# Patient Record
Sex: Male | Born: 1935 | Race: White | Hispanic: No | Marital: Married | Smoking: Never smoker
Health system: Southern US, Community
[De-identification: ages and names within clinical notes are randomized; demographics above are authoritative.]

## PROBLEM LIST (undated history)

## (undated) DIAGNOSIS — I428 Other cardiomyopathies: Secondary | ICD-10-CM

## (undated) DIAGNOSIS — H3530 Unspecified macular degeneration: Secondary | ICD-10-CM

## (undated) DIAGNOSIS — K449 Diaphragmatic hernia without obstruction or gangrene: Secondary | ICD-10-CM

## (undated) DIAGNOSIS — E871 Hypo-osmolality and hyponatremia: Secondary | ICD-10-CM

## (undated) DIAGNOSIS — H5440 Blindness, one eye, unspecified eye: Secondary | ICD-10-CM

## (undated) DIAGNOSIS — K922 Gastrointestinal hemorrhage, unspecified: Secondary | ICD-10-CM

## (undated) DIAGNOSIS — I10 Essential (primary) hypertension: Secondary | ICD-10-CM

## (undated) DIAGNOSIS — D509 Iron deficiency anemia, unspecified: Secondary | ICD-10-CM

## (undated) DIAGNOSIS — Z8711 Personal history of peptic ulcer disease: Secondary | ICD-10-CM

## (undated) DIAGNOSIS — S139XXA Sprain of joints and ligaments of unspecified parts of neck, initial encounter: Secondary | ICD-10-CM

## (undated) DIAGNOSIS — R42 Dizziness and giddiness: Secondary | ICD-10-CM

## (undated) DIAGNOSIS — I4819 Other persistent atrial fibrillation: Secondary | ICD-10-CM

## (undated) DIAGNOSIS — K5730 Diverticulosis of large intestine without perforation or abscess without bleeding: Secondary | ICD-10-CM

## (undated) DIAGNOSIS — K219 Gastro-esophageal reflux disease without esophagitis: Secondary | ICD-10-CM

## (undated) DIAGNOSIS — E7800 Pure hypercholesterolemia, unspecified: Secondary | ICD-10-CM

## (undated) DIAGNOSIS — K2970 Gastritis, unspecified, without bleeding: Secondary | ICD-10-CM

## (undated) DIAGNOSIS — K649 Unspecified hemorrhoids: Secondary | ICD-10-CM

## (undated) DIAGNOSIS — G4730 Sleep apnea, unspecified: Secondary | ICD-10-CM

## (undated) HISTORY — PX: PARTIAL GASTRECTOMY: SHX2172

## (undated) HISTORY — DX: Iron deficiency anemia, unspecified: D50.9

## (undated) HISTORY — DX: Unspecified macular degeneration: H35.30

## (undated) HISTORY — DX: Other persistent atrial fibrillation: I48.19

## (undated) HISTORY — DX: Blindness, one eye, unspecified eye: H54.40

## (undated) HISTORY — DX: Gastro-esophageal reflux disease without esophagitis: K21.9

## (undated) HISTORY — DX: Unspecified hemorrhoids: K64.9

## (undated) HISTORY — DX: Diverticulosis of large intestine without perforation or abscess without bleeding: K57.30

## (undated) HISTORY — DX: Dizziness and giddiness: R42

## (undated) HISTORY — PX: CARDIAC CATHETERIZATION: SHX172

## (undated) HISTORY — DX: Essential (primary) hypertension: I10

## (undated) HISTORY — DX: Personal history of peptic ulcer disease: Z87.11

## (undated) HISTORY — DX: Diaphragmatic hernia without obstruction or gangrene: K44.9

## (undated) HISTORY — DX: Sleep apnea, unspecified: G47.30

## (undated) HISTORY — PX: APPENDECTOMY: SHX54

## (undated) HISTORY — PX: CHOLECYSTECTOMY: SHX55

## (undated) HISTORY — DX: Other cardiomyopathies: I42.8

## (undated) HISTORY — DX: Pure hypercholesterolemia, unspecified: E78.00

## (undated) HISTORY — DX: Sprain of joints and ligaments of unspecified parts of neck, initial encounter: S13.9XXA

## (undated) HISTORY — DX: Gastritis, unspecified, without bleeding: K29.70

## (undated) HISTORY — DX: Gastrointestinal hemorrhage, unspecified: K92.2

---

## 1993-10-04 HISTORY — PX: MITRAL VALVE REPAIR: SHX2039

## 1994-10-04 HISTORY — PX: MITRAL VALVE ANNULOPLASTY: SHX2038

## 1994-10-04 HISTORY — PX: CARDIAC CATHETERIZATION: SHX172

## 1999-06-28 ENCOUNTER — Emergency Department (HOSPITAL_COMMUNITY): Admission: EM | Admit: 1999-06-28 | Discharge: 1999-06-28 | Payer: Self-pay | Admitting: *Deleted

## 2001-07-06 ENCOUNTER — Encounter (INDEPENDENT_AMBULATORY_CARE_PROVIDER_SITE_OTHER): Payer: Self-pay | Admitting: Gastroenterology

## 2003-01-03 ENCOUNTER — Encounter: Admission: RE | Admit: 2003-01-03 | Discharge: 2003-01-03 | Payer: Self-pay | Admitting: Family Medicine

## 2003-01-03 ENCOUNTER — Encounter: Payer: Self-pay | Admitting: Family Medicine

## 2003-05-23 ENCOUNTER — Encounter: Payer: Self-pay | Admitting: Cardiology

## 2003-11-26 ENCOUNTER — Ambulatory Visit (HOSPITAL_COMMUNITY): Admission: RE | Admit: 2003-11-26 | Discharge: 2003-11-26 | Payer: Self-pay | Admitting: Cardiology

## 2004-08-18 ENCOUNTER — Ambulatory Visit: Payer: Self-pay | Admitting: *Deleted

## 2004-08-26 ENCOUNTER — Ambulatory Visit: Payer: Self-pay | Admitting: Cardiology

## 2004-09-04 ENCOUNTER — Ambulatory Visit: Payer: Self-pay | Admitting: Family Medicine

## 2004-09-16 ENCOUNTER — Ambulatory Visit: Payer: Self-pay | Admitting: Cardiology

## 2004-10-07 ENCOUNTER — Ambulatory Visit: Payer: Self-pay | Admitting: Cardiology

## 2004-10-21 ENCOUNTER — Ambulatory Visit: Payer: Self-pay | Admitting: Cardiology

## 2004-11-05 ENCOUNTER — Ambulatory Visit: Payer: Self-pay | Admitting: Cardiology

## 2004-11-19 ENCOUNTER — Ambulatory Visit: Payer: Self-pay | Admitting: *Deleted

## 2004-12-17 ENCOUNTER — Ambulatory Visit: Payer: Self-pay | Admitting: Cardiology

## 2004-12-31 ENCOUNTER — Ambulatory Visit: Payer: Self-pay | Admitting: Internal Medicine

## 2005-01-28 ENCOUNTER — Ambulatory Visit: Payer: Self-pay | Admitting: Cardiology

## 2005-02-25 ENCOUNTER — Ambulatory Visit: Payer: Self-pay | Admitting: Cardiology

## 2005-03-18 ENCOUNTER — Ambulatory Visit: Payer: Self-pay | Admitting: Internal Medicine

## 2005-04-15 ENCOUNTER — Ambulatory Visit: Payer: Self-pay | Admitting: Cardiology

## 2005-05-06 ENCOUNTER — Ambulatory Visit: Payer: Self-pay | Admitting: Cardiology

## 2005-05-06 ENCOUNTER — Ambulatory Visit: Payer: Self-pay | Admitting: Internal Medicine

## 2005-06-03 ENCOUNTER — Ambulatory Visit: Payer: Self-pay | Admitting: Internal Medicine

## 2005-06-23 ENCOUNTER — Ambulatory Visit: Payer: Self-pay | Admitting: Family Medicine

## 2005-06-23 ENCOUNTER — Ambulatory Visit: Payer: Self-pay | Admitting: Cardiology

## 2005-06-25 ENCOUNTER — Ambulatory Visit: Payer: Self-pay | Admitting: Family Medicine

## 2005-07-01 ENCOUNTER — Ambulatory Visit: Payer: Self-pay | Admitting: Cardiology

## 2005-07-21 ENCOUNTER — Ambulatory Visit: Payer: Self-pay | Admitting: Family Medicine

## 2005-07-28 ENCOUNTER — Ambulatory Visit: Payer: Self-pay | Admitting: Family Medicine

## 2005-07-29 ENCOUNTER — Ambulatory Visit: Payer: Self-pay | Admitting: Cardiology

## 2005-08-05 ENCOUNTER — Ambulatory Visit: Payer: Self-pay | Admitting: Cardiology

## 2005-08-12 ENCOUNTER — Ambulatory Visit: Payer: Self-pay | Admitting: Family Medicine

## 2005-08-19 ENCOUNTER — Ambulatory Visit: Payer: Self-pay | Admitting: Internal Medicine

## 2005-09-02 ENCOUNTER — Ambulatory Visit: Payer: Self-pay | Admitting: Cardiology

## 2005-09-16 ENCOUNTER — Ambulatory Visit: Payer: Self-pay | Admitting: Family Medicine

## 2005-09-22 ENCOUNTER — Ambulatory Visit: Payer: Self-pay | Admitting: Family Medicine

## 2005-09-22 ENCOUNTER — Ambulatory Visit: Payer: Self-pay | Admitting: Internal Medicine

## 2005-10-07 ENCOUNTER — Ambulatory Visit: Payer: Self-pay | Admitting: Internal Medicine

## 2005-10-14 ENCOUNTER — Ambulatory Visit: Payer: Self-pay | Admitting: Family Medicine

## 2005-10-20 ENCOUNTER — Ambulatory Visit: Payer: Self-pay | Admitting: Family Medicine

## 2005-11-03 ENCOUNTER — Ambulatory Visit: Payer: Self-pay | Admitting: Family Medicine

## 2005-11-17 ENCOUNTER — Ambulatory Visit: Payer: Self-pay | Admitting: Family Medicine

## 2005-11-22 ENCOUNTER — Ambulatory Visit: Payer: Self-pay | Admitting: Family Medicine

## 2005-11-25 ENCOUNTER — Ambulatory Visit: Payer: Self-pay | Admitting: Family Medicine

## 2005-12-16 ENCOUNTER — Ambulatory Visit: Payer: Self-pay | Admitting: Family Medicine

## 2005-12-23 ENCOUNTER — Ambulatory Visit: Payer: Self-pay | Admitting: Family Medicine

## 2005-12-27 ENCOUNTER — Ambulatory Visit: Payer: Self-pay | Admitting: Family Medicine

## 2006-01-05 ENCOUNTER — Ambulatory Visit: Payer: Self-pay | Admitting: Family Medicine

## 2006-01-19 ENCOUNTER — Ambulatory Visit: Payer: Self-pay | Admitting: Family Medicine

## 2006-02-16 ENCOUNTER — Ambulatory Visit: Payer: Self-pay | Admitting: Family Medicine

## 2006-03-02 ENCOUNTER — Ambulatory Visit: Payer: Self-pay | Admitting: Family Medicine

## 2006-03-10 ENCOUNTER — Ambulatory Visit: Payer: Self-pay | Admitting: Family Medicine

## 2006-03-16 ENCOUNTER — Ambulatory Visit: Payer: Self-pay | Admitting: Family Medicine

## 2006-04-13 ENCOUNTER — Ambulatory Visit: Payer: Self-pay | Admitting: Family Medicine

## 2006-05-11 ENCOUNTER — Ambulatory Visit: Payer: Self-pay | Admitting: Family Medicine

## 2006-06-08 ENCOUNTER — Ambulatory Visit: Payer: Self-pay | Admitting: Family Medicine

## 2006-07-07 ENCOUNTER — Ambulatory Visit: Payer: Self-pay | Admitting: Family Medicine

## 2006-07-20 ENCOUNTER — Ambulatory Visit: Payer: Self-pay | Admitting: Family Medicine

## 2006-08-17 ENCOUNTER — Ambulatory Visit: Payer: Self-pay | Admitting: Family Medicine

## 2006-09-14 ENCOUNTER — Ambulatory Visit: Payer: Self-pay | Admitting: Family Medicine

## 2006-09-19 ENCOUNTER — Ambulatory Visit: Payer: Self-pay | Admitting: Family Medicine

## 2006-10-12 ENCOUNTER — Ambulatory Visit: Payer: Self-pay | Admitting: Family Medicine

## 2006-11-02 ENCOUNTER — Ambulatory Visit: Payer: Self-pay | Admitting: Gastroenterology

## 2006-11-09 ENCOUNTER — Ambulatory Visit: Payer: Self-pay | Admitting: Family Medicine

## 2006-12-07 ENCOUNTER — Ambulatory Visit: Payer: Self-pay | Admitting: Family Medicine

## 2006-12-21 ENCOUNTER — Ambulatory Visit: Payer: Self-pay | Admitting: Family Medicine

## 2007-01-04 ENCOUNTER — Ambulatory Visit: Payer: Self-pay | Admitting: Gastroenterology

## 2007-01-11 ENCOUNTER — Ambulatory Visit: Payer: Self-pay | Admitting: Family Medicine

## 2007-01-17 ENCOUNTER — Ambulatory Visit: Payer: Self-pay | Admitting: Gastroenterology

## 2007-02-01 ENCOUNTER — Ambulatory Visit: Payer: Self-pay | Admitting: Family Medicine

## 2007-02-01 DIAGNOSIS — I4891 Unspecified atrial fibrillation: Secondary | ICD-10-CM

## 2007-02-01 LAB — CONVERTED CEMR LAB: INR: 3.6

## 2007-02-07 ENCOUNTER — Telehealth (INDEPENDENT_AMBULATORY_CARE_PROVIDER_SITE_OTHER): Payer: Self-pay | Admitting: *Deleted

## 2007-02-15 ENCOUNTER — Ambulatory Visit: Payer: Self-pay | Admitting: Family Medicine

## 2007-03-13 ENCOUNTER — Ambulatory Visit: Payer: Self-pay | Admitting: Cardiology

## 2007-03-13 LAB — CONVERTED CEMR LAB
Basophils Absolute: 0.1 10*3/uL (ref 0.0–0.1)
CO2: 29 meq/L (ref 19–32)
Creatinine, Ser: 0.9 mg/dL (ref 0.4–1.5)
Eosinophils Relative: 1.1 % (ref 0.0–5.0)
Glucose, Bld: 111 mg/dL — ABNORMAL HIGH (ref 70–99)
HCT: 37 % — ABNORMAL LOW (ref 39.0–52.0)
Hemoglobin: 12.3 g/dL — ABNORMAL LOW (ref 13.0–17.0)
INR: 2 (ref 0.9–2.0)
MCHC: 33.1 g/dL (ref 30.0–36.0)
Monocytes Absolute: 0.6 10*3/uL (ref 0.2–0.7)
Neutrophils Relative %: 59.4 % (ref 43.0–77.0)
Potassium: 4.1 meq/L (ref 3.5–5.1)
RDW: 14.4 % (ref 11.5–14.6)
Sodium: 139 meq/L (ref 135–145)
WBC: 6 10*3/uL (ref 4.5–10.5)

## 2007-03-14 ENCOUNTER — Ambulatory Visit: Payer: Self-pay | Admitting: Cardiology

## 2007-03-14 ENCOUNTER — Encounter: Payer: Self-pay | Admitting: Cardiology

## 2007-03-14 ENCOUNTER — Ambulatory Visit: Payer: Self-pay

## 2007-03-14 ENCOUNTER — Ambulatory Visit: Payer: Self-pay | Admitting: Family Medicine

## 2007-03-15 ENCOUNTER — Ambulatory Visit: Payer: Self-pay | Admitting: Internal Medicine

## 2007-03-15 ENCOUNTER — Inpatient Hospital Stay (HOSPITAL_COMMUNITY): Admission: AD | Admit: 2007-03-15 | Discharge: 2007-03-17 | Payer: Self-pay | Admitting: Internal Medicine

## 2007-03-15 ENCOUNTER — Encounter: Payer: Self-pay | Admitting: Family Medicine

## 2007-03-20 ENCOUNTER — Ambulatory Visit: Payer: Self-pay | Admitting: Family Medicine

## 2007-03-20 LAB — CONVERTED CEMR LAB: INR: 4.2

## 2007-03-23 ENCOUNTER — Ambulatory Visit: Payer: Self-pay | Admitting: Family Medicine

## 2007-03-23 LAB — CONVERTED CEMR LAB
INR: 2.3
Prothrombin Time: 18.4 s

## 2007-03-29 ENCOUNTER — Ambulatory Visit: Payer: Self-pay | Admitting: Cardiology

## 2007-03-29 ENCOUNTER — Ambulatory Visit: Payer: Self-pay | Admitting: Family Medicine

## 2007-03-29 LAB — CONVERTED CEMR LAB
INR: 1
Prothrombin Time: 12.4 s

## 2007-04-12 ENCOUNTER — Ambulatory Visit: Payer: Self-pay | Admitting: Family Medicine

## 2007-04-12 LAB — CONVERTED CEMR LAB
INR: 5.8
Prothrombin Time: 29.1 s

## 2007-04-19 ENCOUNTER — Ambulatory Visit: Payer: Self-pay | Admitting: Family Medicine

## 2007-04-19 ENCOUNTER — Encounter: Payer: Self-pay | Admitting: Cardiology

## 2007-04-19 ENCOUNTER — Ambulatory Visit: Payer: Self-pay

## 2007-04-19 LAB — CONVERTED CEMR LAB
INR: 1.2
Prothrombin Time: 13.6 s

## 2007-04-27 ENCOUNTER — Ambulatory Visit: Payer: Self-pay | Admitting: Cardiology

## 2007-04-27 ENCOUNTER — Ambulatory Visit: Payer: Self-pay | Admitting: Family Medicine

## 2007-04-27 LAB — CONVERTED CEMR LAB
AST: 43 units/L — ABNORMAL HIGH (ref 0–37)
Albumin: 3.9 g/dL (ref 3.5–5.2)
Alkaline Phosphatase: 55 units/L (ref 39–117)
CO2: 30 meq/L (ref 19–32)
Chloride: 97 meq/L (ref 96–112)
Creatinine, Ser: 0.9 mg/dL (ref 0.4–1.5)
Free T4: 1.1 ng/dL (ref 0.6–1.6)
Sodium: 135 meq/L (ref 135–145)
Total Bilirubin: 0.7 mg/dL (ref 0.3–1.2)

## 2007-04-28 LAB — CONVERTED CEMR LAB
ALT: 58 units/L — ABNORMAL HIGH (ref 0–53)
Albumin: 3.9 g/dL (ref 3.5–5.2)
BUN: 7 mg/dL (ref 6–23)
Bilirubin, Direct: 0.1 mg/dL (ref 0.0–0.3)
Calcium: 9 mg/dL (ref 8.4–10.5)
GFR calc Af Amer: 122 mL/min
GFR calc non Af Amer: 101 mL/min
Glucose, Bld: 94 mg/dL (ref 70–99)
LDL Cholesterol: 92 mg/dL (ref 0–99)
Total CHOL/HDL Ratio: 4.2
VLDL: 31 mg/dL (ref 0–40)

## 2007-05-10 ENCOUNTER — Ambulatory Visit: Payer: Self-pay | Admitting: Family Medicine

## 2007-05-10 ENCOUNTER — Ambulatory Visit: Admission: RE | Admit: 2007-05-10 | Discharge: 2007-05-10 | Payer: Self-pay | Admitting: Cardiology

## 2007-05-11 LAB — CONVERTED CEMR LAB
ALT: 54 units/L — ABNORMAL HIGH (ref 0–53)
AST: 49 units/L — ABNORMAL HIGH (ref 0–37)
Bilirubin, Direct: 0.1 mg/dL (ref 0.0–0.3)
Total Protein: 7.1 g/dL (ref 6.0–8.3)

## 2007-05-24 ENCOUNTER — Ambulatory Visit: Payer: Self-pay | Admitting: Family Medicine

## 2007-06-21 ENCOUNTER — Ambulatory Visit: Payer: Self-pay | Admitting: Family Medicine

## 2007-06-21 LAB — CONVERTED CEMR LAB
ALT: 44 units/L (ref 0–53)
AST: 41 units/L — ABNORMAL HIGH (ref 0–37)
Bilirubin, Direct: 0.1 mg/dL (ref 0.0–0.3)
Total Protein: 6.8 g/dL (ref 6.0–8.3)

## 2007-07-06 ENCOUNTER — Ambulatory Visit: Payer: Self-pay | Admitting: Cardiology

## 2007-07-06 LAB — CONVERTED CEMR LAB
GFR calc non Af Amer: 88 mL/min
Potassium: 4.8 meq/L (ref 3.5–5.1)
Sodium: 136 meq/L (ref 135–145)
TSH: 0.72 microintl units/mL (ref 0.35–5.50)

## 2007-07-19 ENCOUNTER — Encounter: Payer: Self-pay | Admitting: Cardiology

## 2007-07-19 ENCOUNTER — Ambulatory Visit: Payer: Self-pay

## 2007-07-19 ENCOUNTER — Ambulatory Visit: Payer: Self-pay | Admitting: Family Medicine

## 2007-07-27 ENCOUNTER — Ambulatory Visit: Payer: Self-pay | Admitting: Cardiology

## 2007-08-04 ENCOUNTER — Ambulatory Visit: Payer: Self-pay | Admitting: Family Medicine

## 2007-09-06 ENCOUNTER — Ambulatory Visit: Payer: Self-pay | Admitting: Family Medicine

## 2007-10-04 ENCOUNTER — Ambulatory Visit: Payer: Self-pay | Admitting: Family Medicine

## 2007-10-04 LAB — CONVERTED CEMR LAB
INR: 2.4
Prothrombin Time: 18.8 s

## 2007-11-01 ENCOUNTER — Ambulatory Visit: Payer: Self-pay | Admitting: Family Medicine

## 2007-11-01 LAB — CONVERTED CEMR LAB: INR: 1.9

## 2007-11-16 ENCOUNTER — Ambulatory Visit: Payer: Self-pay | Admitting: Cardiology

## 2007-11-16 LAB — CONVERTED CEMR LAB
BUN: 5 mg/dL — ABNORMAL LOW (ref 6–23)
Basophils Absolute: 0 10*3/uL (ref 0.0–0.1)
Creatinine, Ser: 0.8 mg/dL (ref 0.4–1.5)
Eosinophils Absolute: 0.3 10*3/uL (ref 0.0–0.6)
GFR calc Af Amer: 122 mL/min
GFR calc non Af Amer: 101 mL/min
Hemoglobin: 12.8 g/dL — ABNORMAL LOW (ref 13.0–17.0)
MCHC: 32.6 g/dL (ref 30.0–36.0)
Monocytes Absolute: 0.9 10*3/uL — ABNORMAL HIGH (ref 0.2–0.7)
Monocytes Relative: 18.6 % — ABNORMAL HIGH (ref 3.0–11.0)
Potassium: 4.7 meq/L (ref 3.5–5.1)
RBC: 4.94 M/uL (ref 4.22–5.81)
RDW: 14 % (ref 11.5–14.6)
TSH: 0.51 microintl units/mL (ref 0.35–5.50)

## 2007-11-21 ENCOUNTER — Ambulatory Visit: Payer: Self-pay | Admitting: Cardiology

## 2007-11-29 ENCOUNTER — Ambulatory Visit: Payer: Self-pay | Admitting: Family Medicine

## 2007-11-29 LAB — CONVERTED CEMR LAB: INR: 2.7

## 2007-12-27 ENCOUNTER — Ambulatory Visit: Payer: Self-pay | Admitting: Family Medicine

## 2007-12-27 LAB — CONVERTED CEMR LAB

## 2008-01-03 ENCOUNTER — Ambulatory Visit: Payer: Self-pay | Admitting: Family Medicine

## 2008-01-31 ENCOUNTER — Ambulatory Visit: Payer: Self-pay | Admitting: Cardiology

## 2008-01-31 ENCOUNTER — Ambulatory Visit: Payer: Self-pay | Admitting: Family Medicine

## 2008-01-31 LAB — CONVERTED CEMR LAB
Basophils Relative: 0.9 % (ref 0.0–1.0)
Eosinophils Relative: 6.2 % — ABNORMAL HIGH (ref 0.0–5.0)
INR: 2.2
Lymphocytes Relative: 37.3 % (ref 12.0–46.0)
Monocytes Relative: 17.8 % — ABNORMAL HIGH (ref 3.0–12.0)
Neutrophils Relative %: 37.8 % — ABNORMAL LOW (ref 43.0–77.0)
Prothrombin Time: 18.2 s
RBC: 4.38 M/uL (ref 4.22–5.81)
WBC: 4.9 10*3/uL (ref 4.5–10.5)

## 2008-02-28 ENCOUNTER — Ambulatory Visit: Payer: Self-pay | Admitting: Cardiology

## 2008-02-28 ENCOUNTER — Ambulatory Visit: Payer: Self-pay | Admitting: Family Medicine

## 2008-02-28 LAB — CONVERTED CEMR LAB
ALT: 27 units/L (ref 0–53)
AST: 34 units/L (ref 0–37)
Bilirubin, Direct: 0.1 mg/dL (ref 0.0–0.3)
Eosinophils Relative: 6.3 % — ABNORMAL HIGH (ref 0.0–5.0)
HCT: 35.4 % — ABNORMAL LOW (ref 39.0–52.0)
Hemoglobin: 11.4 g/dL — ABNORMAL LOW (ref 13.0–17.0)
INR: 2.3
Lymphocytes Relative: 31.9 % (ref 12.0–46.0)
Monocytes Absolute: 0.8 10*3/uL (ref 0.1–1.0)
Monocytes Relative: 17.8 % — ABNORMAL HIGH (ref 3.0–12.0)
Neutro Abs: 1.8 10*3/uL (ref 1.4–7.7)
Prothrombin Time: 18.5 s
Total Protein: 6.6 g/dL (ref 6.0–8.3)
WBC: 4.3 10*3/uL — ABNORMAL LOW (ref 4.5–10.5)

## 2008-03-27 ENCOUNTER — Ambulatory Visit: Payer: Self-pay | Admitting: Family Medicine

## 2008-03-27 LAB — CONVERTED CEMR LAB: INR: 2.5

## 2008-04-08 DIAGNOSIS — E7800 Pure hypercholesterolemia, unspecified: Secondary | ICD-10-CM | POA: Insufficient documentation

## 2008-04-08 DIAGNOSIS — G4730 Sleep apnea, unspecified: Secondary | ICD-10-CM | POA: Insufficient documentation

## 2008-04-08 DIAGNOSIS — Z8719 Personal history of other diseases of the digestive system: Secondary | ICD-10-CM

## 2008-04-08 DIAGNOSIS — K5730 Diverticulosis of large intestine without perforation or abscess without bleeding: Secondary | ICD-10-CM | POA: Insufficient documentation

## 2008-04-08 DIAGNOSIS — I501 Left ventricular failure: Secondary | ICD-10-CM

## 2008-04-08 DIAGNOSIS — K649 Unspecified hemorrhoids: Secondary | ICD-10-CM | POA: Insufficient documentation

## 2008-04-08 DIAGNOSIS — K449 Diaphragmatic hernia without obstruction or gangrene: Secondary | ICD-10-CM | POA: Insufficient documentation

## 2008-04-09 ENCOUNTER — Ambulatory Visit: Payer: Self-pay | Admitting: Gastroenterology

## 2008-04-09 DIAGNOSIS — K219 Gastro-esophageal reflux disease without esophagitis: Secondary | ICD-10-CM | POA: Insufficient documentation

## 2008-04-09 DIAGNOSIS — D509 Iron deficiency anemia, unspecified: Secondary | ICD-10-CM

## 2008-04-10 LAB — CONVERTED CEMR LAB
Folate: 16.9 ng/mL
Saturation Ratios: 3.1 % — ABNORMAL LOW (ref 20.0–50.0)
Transferrin: 369.8 mg/dL — ABNORMAL HIGH (ref >=212.0)
Vitamin B-12: 274 pg/mL (ref 211–911)

## 2008-04-17 ENCOUNTER — Ambulatory Visit: Payer: Self-pay | Admitting: Gastroenterology

## 2008-04-17 ENCOUNTER — Other Ambulatory Visit: Payer: Self-pay

## 2008-04-17 ENCOUNTER — Inpatient Hospital Stay: Payer: Self-pay | Admitting: Internal Medicine

## 2008-04-17 ENCOUNTER — Encounter: Payer: Self-pay | Admitting: Gastroenterology

## 2008-04-17 ENCOUNTER — Telehealth: Payer: Self-pay | Admitting: Gastroenterology

## 2008-04-22 ENCOUNTER — Encounter: Payer: Self-pay | Admitting: Gastroenterology

## 2008-04-22 ENCOUNTER — Encounter: Payer: Self-pay | Admitting: Family Medicine

## 2008-04-25 ENCOUNTER — Encounter: Payer: Self-pay | Admitting: Family Medicine

## 2008-05-08 ENCOUNTER — Ambulatory Visit: Payer: Self-pay | Admitting: Cardiology

## 2008-05-08 LAB — CONVERTED CEMR LAB
ALT: 21 units/L (ref 0–53)
AST: 23 units/L (ref 0–37)
Albumin: 3.9 g/dL (ref 3.5–5.2)
Alkaline Phosphatase: 46 units/L (ref 39–117)
BUN: 9 mg/dL (ref 6–23)
Bilirubin, Direct: 0.1 mg/dL (ref 0.0–0.3)
CO2: 31 meq/L (ref 19–32)
Chloride: 97 meq/L (ref 96–112)
Eosinophils Relative: 4.8 % (ref 0.0–5.0)
GFR calc Af Amer: 94 mL/min
Glucose, Bld: 105 mg/dL — ABNORMAL HIGH (ref 70–99)
HCT: 36.6 % — ABNORMAL LOW (ref 39.0–52.0)
Lymphocytes Relative: 31.2 % (ref 12.0–46.0)
Monocytes Absolute: 0.5 10*3/uL (ref 0.1–1.0)
Monocytes Relative: 9.5 % (ref 3.0–12.0)
Neutrophils Relative %: 53.8 % (ref 43.0–77.0)
Platelets: 333 10*3/uL (ref 150–400)
Potassium: 4.1 meq/L (ref 3.5–5.1)
RDW: 16.2 % — ABNORMAL HIGH (ref 11.5–14.6)
Sodium: 136 meq/L (ref 135–145)
Total Protein: 6.6 g/dL (ref 6.0–8.3)
WBC: 5 10*3/uL (ref 4.5–10.5)

## 2008-05-24 ENCOUNTER — Ambulatory Visit: Payer: Self-pay | Admitting: Family Medicine

## 2008-05-29 ENCOUNTER — Ambulatory Visit: Payer: Self-pay | Admitting: Cardiology

## 2008-05-30 ENCOUNTER — Encounter: Payer: Self-pay | Admitting: Family Medicine

## 2008-06-13 ENCOUNTER — Ambulatory Visit: Payer: Self-pay | Admitting: Family Medicine

## 2008-06-13 LAB — CONVERTED CEMR LAB: INR: 1

## 2008-06-19 ENCOUNTER — Ambulatory Visit: Payer: Self-pay | Admitting: Family Medicine

## 2008-06-19 ENCOUNTER — Ambulatory Visit: Payer: Self-pay | Admitting: Cardiology

## 2008-06-19 LAB — CONVERTED CEMR LAB
INR: 1.5
Prothrombin Time: 14.9 s

## 2008-06-26 ENCOUNTER — Ambulatory Visit: Payer: Self-pay | Admitting: Family Medicine

## 2008-06-26 LAB — CONVERTED CEMR LAB: Prothrombin Time: 16.2 s

## 2008-07-01 ENCOUNTER — Encounter: Payer: Self-pay | Admitting: Family Medicine

## 2008-07-03 ENCOUNTER — Ambulatory Visit: Payer: Self-pay | Admitting: Family Medicine

## 2008-07-03 LAB — CONVERTED CEMR LAB
INR: 1.7
Prothrombin Time: 15.9 s

## 2008-07-17 ENCOUNTER — Ambulatory Visit: Payer: Self-pay | Admitting: Family Medicine

## 2008-07-17 LAB — CONVERTED CEMR LAB: INR: 1.7

## 2008-07-29 ENCOUNTER — Telehealth: Payer: Self-pay | Admitting: Family Medicine

## 2008-07-29 ENCOUNTER — Ambulatory Visit: Payer: Self-pay | Admitting: Family Medicine

## 2008-07-29 ENCOUNTER — Ambulatory Visit: Payer: Self-pay | Admitting: Cardiology

## 2008-08-19 ENCOUNTER — Encounter: Payer: Self-pay | Admitting: Family Medicine

## 2008-08-23 ENCOUNTER — Encounter: Payer: Self-pay | Admitting: Family Medicine

## 2008-08-28 ENCOUNTER — Ambulatory Visit: Payer: Self-pay | Admitting: Family Medicine

## 2008-08-28 LAB — CONVERTED CEMR LAB: INR: 1.5

## 2008-09-12 ENCOUNTER — Ambulatory Visit: Payer: Self-pay | Admitting: Family Medicine

## 2008-09-12 LAB — CONVERTED CEMR LAB: Prothrombin Time: 18.3 s

## 2008-09-30 ENCOUNTER — Ambulatory Visit: Payer: Self-pay | Admitting: Family Medicine

## 2008-09-30 LAB — CONVERTED CEMR LAB
INR: 1.5
Prothrombin Time: 14.9 s

## 2008-10-07 ENCOUNTER — Encounter: Payer: Self-pay | Admitting: Family Medicine

## 2008-10-08 ENCOUNTER — Ambulatory Visit: Payer: Self-pay | Admitting: Family Medicine

## 2008-10-08 LAB — CONVERTED CEMR LAB: Prothrombin Time: 24.5 s

## 2008-10-13 HISTORY — PX: CATARACT EXTRACTION: SUR2

## 2008-10-14 ENCOUNTER — Encounter: Payer: Self-pay | Admitting: Family Medicine

## 2008-10-17 ENCOUNTER — Encounter: Payer: Self-pay | Admitting: Family Medicine

## 2008-10-18 ENCOUNTER — Ambulatory Visit: Payer: Self-pay | Admitting: Family Medicine

## 2008-10-19 LAB — CONVERTED CEMR LAB: Prothrombin Time: 52.4 s — ABNORMAL HIGH (ref 10.9–13.3)

## 2008-10-24 ENCOUNTER — Ambulatory Visit: Payer: Self-pay | Admitting: Family Medicine

## 2008-10-29 ENCOUNTER — Ambulatory Visit: Payer: Self-pay | Admitting: Cardiology

## 2008-10-29 LAB — CONVERTED CEMR LAB
BUN: 12 mg/dL (ref 6–23)
CO2: 29 meq/L (ref 19–32)
Glucose, Bld: 84 mg/dL (ref 70–99)
Potassium: 4 meq/L (ref 3.5–5.1)
Sodium: 133 meq/L — ABNORMAL LOW (ref 135–145)

## 2008-11-12 ENCOUNTER — Encounter (INDEPENDENT_AMBULATORY_CARE_PROVIDER_SITE_OTHER): Payer: Self-pay | Admitting: *Deleted

## 2008-11-20 ENCOUNTER — Encounter: Payer: Self-pay | Admitting: Family Medicine

## 2008-11-21 ENCOUNTER — Encounter: Payer: Self-pay | Admitting: Family Medicine

## 2008-12-06 ENCOUNTER — Encounter: Payer: Self-pay | Admitting: Family Medicine

## 2008-12-10 ENCOUNTER — Ambulatory Visit: Payer: Self-pay | Admitting: Cardiology

## 2008-12-18 ENCOUNTER — Encounter: Payer: Self-pay | Admitting: Family Medicine

## 2008-12-18 ENCOUNTER — Ambulatory Visit: Payer: Self-pay | Admitting: Unknown Physician Specialty

## 2008-12-18 DIAGNOSIS — K2970 Gastritis, unspecified, without bleeding: Secondary | ICD-10-CM

## 2008-12-18 HISTORY — DX: Gastritis, unspecified, without bleeding: K29.70

## 2008-12-23 ENCOUNTER — Telehealth: Payer: Self-pay | Admitting: Family Medicine

## 2009-01-02 ENCOUNTER — Encounter: Payer: Self-pay | Admitting: Family Medicine

## 2009-02-03 ENCOUNTER — Encounter: Payer: Self-pay | Admitting: Cardiology

## 2009-02-04 ENCOUNTER — Encounter: Payer: Self-pay | Admitting: Family Medicine

## 2009-02-18 ENCOUNTER — Ambulatory Visit: Payer: Self-pay | Admitting: Cardiology

## 2009-02-18 ENCOUNTER — Encounter: Payer: Self-pay | Admitting: Cardiology

## 2009-02-18 ENCOUNTER — Ambulatory Visit: Payer: Self-pay

## 2009-02-19 LAB — CONVERTED CEMR LAB
Free T4: 1.1 ng/dL (ref 0.6–1.6)
TSH: 0.46 microintl units/mL (ref 0.35–5.50)

## 2009-02-25 ENCOUNTER — Encounter: Payer: Self-pay | Admitting: Cardiology

## 2009-02-25 ENCOUNTER — Ambulatory Visit: Payer: Self-pay | Admitting: Internal Medicine

## 2009-02-27 DIAGNOSIS — Z8711 Personal history of peptic ulcer disease: Secondary | ICD-10-CM

## 2009-03-04 ENCOUNTER — Ambulatory Visit: Payer: Self-pay | Admitting: Cardiology

## 2009-03-04 ENCOUNTER — Encounter: Payer: Self-pay | Admitting: Family Medicine

## 2009-03-04 DIAGNOSIS — I059 Rheumatic mitral valve disease, unspecified: Secondary | ICD-10-CM | POA: Insufficient documentation

## 2009-04-11 ENCOUNTER — Encounter: Payer: Self-pay | Admitting: Family Medicine

## 2009-04-29 ENCOUNTER — Encounter: Payer: Self-pay | Admitting: Family Medicine

## 2009-05-15 ENCOUNTER — Encounter: Payer: Self-pay | Admitting: Family Medicine

## 2009-05-16 ENCOUNTER — Telehealth: Payer: Self-pay | Admitting: Cardiology

## 2009-05-23 ENCOUNTER — Telehealth: Payer: Self-pay | Admitting: Family Medicine

## 2009-06-05 ENCOUNTER — Encounter: Payer: Self-pay | Admitting: Family Medicine

## 2009-06-11 ENCOUNTER — Ambulatory Visit: Payer: Self-pay | Admitting: Cardiology

## 2009-06-11 DIAGNOSIS — Z9889 Other specified postprocedural states: Secondary | ICD-10-CM | POA: Insufficient documentation

## 2009-06-18 ENCOUNTER — Telehealth: Payer: Self-pay | Admitting: Family Medicine

## 2009-06-24 ENCOUNTER — Telehealth: Payer: Self-pay | Admitting: Cardiology

## 2009-07-03 ENCOUNTER — Ambulatory Visit: Payer: Self-pay | Admitting: Family Medicine

## 2009-07-22 ENCOUNTER — Encounter: Payer: Self-pay | Admitting: Family Medicine

## 2009-08-08 ENCOUNTER — Telehealth: Payer: Self-pay | Admitting: Family Medicine

## 2009-08-11 ENCOUNTER — Encounter: Payer: Self-pay | Admitting: Family Medicine

## 2009-09-03 ENCOUNTER — Ambulatory Visit: Payer: Self-pay | Admitting: Family Medicine

## 2009-09-03 DIAGNOSIS — N138 Other obstructive and reflux uropathy: Secondary | ICD-10-CM | POA: Insufficient documentation

## 2009-09-03 DIAGNOSIS — N401 Enlarged prostate with lower urinary tract symptoms: Secondary | ICD-10-CM

## 2009-09-04 ENCOUNTER — Encounter: Payer: Self-pay | Admitting: Family Medicine

## 2009-09-04 ENCOUNTER — Telehealth: Payer: Self-pay | Admitting: Cardiology

## 2009-09-04 ENCOUNTER — Encounter: Payer: Self-pay | Admitting: Cardiology

## 2009-09-04 ENCOUNTER — Encounter (INDEPENDENT_AMBULATORY_CARE_PROVIDER_SITE_OTHER): Payer: Self-pay | Admitting: *Deleted

## 2009-09-04 ENCOUNTER — Inpatient Hospital Stay: Payer: Self-pay | Admitting: Internal Medicine

## 2009-09-04 ENCOUNTER — Ambulatory Visit: Payer: Self-pay | Admitting: Cardiovascular Disease

## 2009-09-04 ENCOUNTER — Telehealth: Payer: Self-pay | Admitting: Family Medicine

## 2009-09-06 ENCOUNTER — Encounter: Payer: Self-pay | Admitting: Family Medicine

## 2009-09-22 ENCOUNTER — Encounter: Payer: Self-pay | Admitting: Family Medicine
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
## 2009-10-06 ENCOUNTER — Encounter: Payer: Self-pay | Admitting: Family Medicine

## 2009-10-21 ENCOUNTER — Encounter: Payer: Self-pay | Admitting: Family Medicine

## 2009-11-05 ENCOUNTER — Ambulatory Visit: Payer: Self-pay | Admitting: Family Medicine

## 2009-11-12 ENCOUNTER — Ambulatory Visit: Payer: Self-pay | Admitting: Family Medicine

## 2009-11-12 DIAGNOSIS — L719 Rosacea, unspecified: Secondary | ICD-10-CM

## 2009-12-09 ENCOUNTER — Encounter: Payer: Self-pay | Admitting: Family Medicine

## 2009-12-23 ENCOUNTER — Telehealth (INDEPENDENT_AMBULATORY_CARE_PROVIDER_SITE_OTHER): Payer: Self-pay | Admitting: *Deleted

## 2009-12-24 ENCOUNTER — Ambulatory Visit: Payer: Self-pay | Admitting: Family Medicine

## 2009-12-25 LAB — CONVERTED CEMR LAB: AST: 23 units/L (ref 0–37)

## 2010-01-02 ENCOUNTER — Encounter: Payer: Self-pay | Admitting: Family Medicine

## 2010-01-22 ENCOUNTER — Ambulatory Visit: Payer: Self-pay | Admitting: Cardiology

## 2010-02-09 ENCOUNTER — Ambulatory Visit: Payer: Self-pay | Admitting: Family Medicine

## 2010-02-09 LAB — CONVERTED CEMR LAB
AST: 24 units/L (ref 0–37)
Direct LDL: 154.1 mg/dL
Total CHOL/HDL Ratio: 5
VLDL: 44.8 mg/dL — ABNORMAL HIGH (ref 0.0–40.0)

## 2010-02-10 ENCOUNTER — Ambulatory Visit: Payer: Self-pay | Admitting: Family Medicine

## 2010-03-11 ENCOUNTER — Encounter (INDEPENDENT_AMBULATORY_CARE_PROVIDER_SITE_OTHER): Payer: Self-pay | Admitting: *Deleted

## 2010-03-11 ENCOUNTER — Encounter: Payer: Self-pay | Admitting: Family Medicine

## 2010-03-12 ENCOUNTER — Encounter (INDEPENDENT_AMBULATORY_CARE_PROVIDER_SITE_OTHER): Payer: Self-pay | Admitting: *Deleted

## 2010-03-23 ENCOUNTER — Encounter: Payer: Self-pay | Admitting: Family Medicine

## 2010-04-09 ENCOUNTER — Encounter: Payer: Self-pay | Admitting: Family Medicine

## 2010-05-12 ENCOUNTER — Encounter (INDEPENDENT_AMBULATORY_CARE_PROVIDER_SITE_OTHER): Payer: Self-pay | Admitting: *Deleted

## 2010-05-13 ENCOUNTER — Encounter: Payer: Self-pay | Admitting: Family Medicine

## 2010-06-16 ENCOUNTER — Encounter: Payer: Self-pay | Admitting: Family Medicine

## 2010-06-16 ENCOUNTER — Encounter (INDEPENDENT_AMBULATORY_CARE_PROVIDER_SITE_OTHER): Payer: Self-pay | Admitting: *Deleted

## 2010-06-16 LAB — CONVERTED CEMR LAB: Prothrombin Time: 43.4 s

## 2010-06-18 ENCOUNTER — Encounter (INDEPENDENT_AMBULATORY_CARE_PROVIDER_SITE_OTHER): Payer: Self-pay | Admitting: *Deleted

## 2010-06-19 ENCOUNTER — Encounter (INDEPENDENT_AMBULATORY_CARE_PROVIDER_SITE_OTHER): Payer: Self-pay | Admitting: *Deleted

## 2010-06-25 ENCOUNTER — Encounter: Payer: Self-pay | Admitting: Family Medicine

## 2010-07-13 ENCOUNTER — Ambulatory Visit: Payer: Self-pay | Admitting: Family Medicine

## 2010-07-14 LAB — CONVERTED CEMR LAB
AST: 19 units/L (ref 0–37)
Alkaline Phosphatase: 46 units/L (ref 39–117)
Basophils Absolute: 0.1 10*3/uL (ref 0.0–0.1)
Bilirubin, Direct: 0.1 mg/dL (ref 0.0–0.3)
GFR calc non Af Amer: 98.65 mL/min (ref >60)
HCT: 40.4 % (ref 39.0–52.0)
HDL: 48.2 mg/dL (ref >39.00)
Lymphs Abs: 2.4 10*3/uL (ref 0.7–4.0)
MCV: 77.6 fL — ABNORMAL LOW (ref 78.0–100.0)
Monocytes Absolute: 0.7 10*3/uL (ref 0.1–1.0)
PSA: 0.36 ng/mL (ref 0.10–4.00)
Platelets: 243 10*3/uL (ref 150.0–400.0)
Potassium: 5.1 meq/L (ref 3.5–5.1)
RDW: 17.5 % — ABNORMAL HIGH (ref 11.5–14.6)
Sodium: 136 meq/L (ref 135–145)
Total Bilirubin: 0.6 mg/dL (ref 0.3–1.2)
VLDL: 38.6 mg/dL (ref 0.0–40.0)

## 2010-07-16 ENCOUNTER — Encounter: Payer: Self-pay | Admitting: Family Medicine

## 2010-07-16 ENCOUNTER — Ambulatory Visit: Payer: Self-pay | Admitting: Cardiology

## 2010-07-16 ENCOUNTER — Ambulatory Visit: Payer: Self-pay | Admitting: Family Medicine

## 2010-08-04 ENCOUNTER — Encounter: Payer: Self-pay | Admitting: Cardiology

## 2010-08-18 ENCOUNTER — Ambulatory Visit: Payer: Self-pay | Admitting: Cardiology

## 2010-08-18 ENCOUNTER — Telehealth (INDEPENDENT_AMBULATORY_CARE_PROVIDER_SITE_OTHER): Payer: Self-pay | Admitting: *Deleted

## 2010-08-18 ENCOUNTER — Encounter: Payer: Self-pay | Admitting: Cardiology

## 2010-08-18 ENCOUNTER — Ambulatory Visit: Payer: Self-pay

## 2010-08-18 ENCOUNTER — Ambulatory Visit: Payer: Self-pay | Admitting: Cardiovascular Disease

## 2010-08-18 ENCOUNTER — Ambulatory Visit (HOSPITAL_COMMUNITY): Admission: RE | Admit: 2010-08-18 | Discharge: 2010-08-18 | Payer: Self-pay | Admitting: Cardiology

## 2010-08-18 ENCOUNTER — Encounter: Payer: Self-pay | Admitting: Family Medicine

## 2010-08-19 ENCOUNTER — Telehealth: Payer: Self-pay | Admitting: Family Medicine

## 2010-08-20 ENCOUNTER — Telehealth: Payer: Self-pay | Admitting: Cardiology

## 2010-09-01 LAB — CONVERTED CEMR LAB: TSH: 0.64 microintl units/mL (ref 0.35–5.50)

## 2010-09-02 ENCOUNTER — Ambulatory Visit: Payer: Self-pay | Admitting: Cardiology

## 2010-09-09 ENCOUNTER — Ambulatory Visit (HOSPITAL_COMMUNITY)
Admission: RE | Admit: 2010-09-09 | Discharge: 2010-09-09 | Payer: Self-pay | Source: Home / Self Care | Admitting: Cardiology

## 2010-09-10 ENCOUNTER — Telehealth: Payer: Self-pay | Admitting: Family Medicine

## 2010-09-15 ENCOUNTER — Ambulatory Visit: Payer: Self-pay | Admitting: Cardiology

## 2010-09-15 ENCOUNTER — Encounter: Payer: Self-pay | Admitting: Family Medicine

## 2010-09-15 ENCOUNTER — Telehealth: Payer: Self-pay | Admitting: Family Medicine

## 2010-09-15 ENCOUNTER — Encounter: Payer: Self-pay | Admitting: Cardiology

## 2010-09-16 ENCOUNTER — Encounter: Payer: Self-pay | Admitting: Family Medicine

## 2010-10-20 ENCOUNTER — Ambulatory Visit (HOSPITAL_COMMUNITY)
Admission: RE | Admit: 2010-10-20 | Discharge: 2010-10-20 | Payer: Self-pay | Source: Home / Self Care | Attending: Cardiology | Admitting: Cardiology

## 2010-10-20 ENCOUNTER — Ambulatory Visit
Admission: RE | Admit: 2010-10-20 | Discharge: 2010-10-20 | Payer: Self-pay | Source: Home / Self Care | Attending: Cardiology | Admitting: Cardiology

## 2010-10-20 ENCOUNTER — Encounter: Payer: Self-pay | Admitting: Cardiology

## 2010-10-20 ENCOUNTER — Encounter: Payer: Self-pay | Admitting: Family Medicine

## 2010-10-20 ENCOUNTER — Ambulatory Visit: Admission: RE | Admit: 2010-10-20 | Discharge: 2010-10-20 | Payer: Self-pay | Source: Home / Self Care

## 2010-11-01 LAB — CONVERTED CEMR LAB
BUN: 6 mg/dL (ref 6–23)
BUN: 8 mg/dL (ref 6–23)
Basophils Relative: 0.7 % (ref 0.0–3.0)
Basophils Relative: 0.9 % (ref 0.0–3.0)
Bilirubin, Direct: 0.1 mg/dL (ref 0.0–0.3)
CO2: 32 meq/L (ref 19–32)
Chloride: 105 meq/L (ref 96–112)
Chloride: 99 meq/L (ref 96–112)
Cholesterol: 223 mg/dL — ABNORMAL HIGH (ref 0–200)
Creatinine, Ser: 0.9 mg/dL (ref 0.4–1.5)
Eosinophils Absolute: 0.2 10*3/uL (ref 0.0–0.7)
Eosinophils Relative: 2.4 % (ref 0.0–5.0)
Free T4: 1.2 ng/dL (ref 0.6–1.6)
Glucose, Bld: 96 mg/dL (ref 70–99)
HCT: 40.2 % (ref 39.0–52.0)
Hemoglobin: 13.5 g/dL (ref 13.0–17.0)
Lymphs Abs: 2.2 10*3/uL (ref 0.7–4.0)
MCHC: 31.5 g/dL (ref 30.0–36.0)
MCV: 68 fL — ABNORMAL LOW (ref 78.0–100.0)
MCV: 78.5 fL (ref 78.0–100.0)
Monocytes Absolute: 0.6 10*3/uL (ref 0.1–1.0)
Monocytes Absolute: 0.7 10*3/uL (ref 0.1–1.0)
Neutro Abs: 2.7 10*3/uL (ref 1.4–7.7)
Neutrophils Relative %: 31.4 % — ABNORMAL LOW (ref 43.0–77.0)
PSA: 0.76 ng/mL (ref 0.10–4.00)
Platelets: 220 10*3/uL (ref 150.0–400.0)
Platelets: 242 10*3/uL (ref 150.0–400.0)
Potassium: 4.6 meq/L (ref 3.5–5.1)
Prothrombin Time: 31.5 s — ABNORMAL HIGH (ref 9.7–11.8)
RBC: 4.81 M/uL (ref 4.22–5.81)
RDW: 18.4 % — ABNORMAL HIGH (ref 11.5–14.6)
Sodium: 138 meq/L (ref 135–145)
Total Protein: 7.1 g/dL (ref 6.0–8.3)
Vitamin B-12: 303 pg/mL (ref 211–911)
WBC: 5.8 10*3/uL (ref 4.5–10.5)

## 2010-11-05 NOTE — Progress Notes (Signed)
Summary: Rx Warfarin Sodium  Phone Note Refill Request   please notify patient that coumadin rx was sent in.  thanks. Crawford Givens MD  August 19, 2010 11:44 AM   Initial call taken by: Crawford Givens MD,  August 19, 2010 11:44 AM  Follow-up for Phone Call       Follow-up by: Crawford Givens MD,  August 19, 2010 11:43 AM  Additional Follow-up for Phone Call Additional follow up Details #1::        Patient advised as instructed via telephone, Rx sent to [facility]. Additional Follow-up by: Linde Gillis CMA Duncan Dull),  August 19, 2010 2:32 PM    Prescriptions: WARFARIN SODIUM 5 MG TABS (WARFARIN SODIUM) Take 5 mg. once daily .  #30 x 5   Entered and Authorized by:   Crawford Givens MD   Signed by:   Crawford Givens MD on 08/19/2010   Method used:   Electronically to        [facility] [address] (retail)       [address]       [place], [place]  [address]       Ph: [phone]       Fax: [phone]   RxID:   [id number]

## 2010-11-05 NOTE — Assessment & Plan Note (Signed)
Summary: f89m   Visit Type:  6 months follow up Primary Pammy Vesey:  [name]  CC:  No cardiac complaints.  History of Present Illness: Overall feels great at this point.  No ongoing symptoms per se.  Was unaware that he was back into atrial fibrillation.  Does not really feel or notice this.  Denies chest pain.  Remains on warfarin.  Current Medications (verified): 1)  Digitek 0.25 Mg Tabs (Digoxin) .... One By Mouth Once Daily 2)  Lasix 40 Mg  Tabs (Furosemide) .... One By Mouth Two Times A Day 3)  Enalapril Maleate 2.5 Mg  Tabs (Enalapril Maleate) .... One By Mouth Two Times A Day 4)  Klor-Con M10 10 Meq  Tbcr (Potassium Chloride Crys Cr) .... One By Mouth Two Times A Day 5)  Flomax 0.4 Mg Xr24h-Cap (Tamsulosin Hcl) .... One Tab By Mouth Once Daily. 6)  Warfarin Sodium 5 Mg Tabs (Warfarin Sodium) .... Take 5 Mg. Once Daily . 7)  Omeprazole 20 Mg Cpdr (Omeprazole) .... Take One By Mouth Daily 8)  Proscar 5 Mg Tabs (Finasteride) .... One Tab By Mouth Daily  Allergies: 1)  Lipitor (Atorvastatin Calcium)  Past History:  Past Medical History: Last updated: 02/27/2009 Current Problems:  CORONARY ARTERY DISEASE (ICD-414.00) ANTICOAGULATION THERAPY (ICD-V58.61) ATRIAL FIBRILLATION (ICD-427.31) HYPERCHOLESTEROLEMIA (ICD-272.0) GERD (ICD-530.81) NECK SPRAIN AND STRAIN (ICD-847.0) VERTIGO (ICD-780.4) ANEMIA, IRON DEFICIENCY (ICD-280.9) SLEEP APNEA (ICD-780.57) LEFT VENTRICULAR FAILURE (ICD-428.1) GASTROINTESTINAL HEMORRHAGE, HX OF (ICD-V12.79) HIATAL HERNIA (ICD-553.3) HEMORRHOIDS (ICD-455.6) DIVERTICULOSIS, COLON (ICD-562.10) AFTERCARE, LONG-TERM USE, MEDICATIONS NEC (ICD-V58.69) ENCOUNTER FOR THERAPEUTIC DRUG MONITORING (ICD-V58.83) PEPTIC ULCER DISEASE, HX OF (ICD-V12.71)  Past Surgical History: Last updated: 09/11/2009 mitral valve repair 1995 Cholecystectomy partial gastrectomy Appendectomy EGD Brushing of area near prior anastamosis (Dr Jarold Motto)  04/17/2008 HOSP UGI Bleed Elevated INR Coumadin stopped Sinusitis 7/15- 04/22/2008 Left Catarract Repair [facility] 10/13/2008 EGD Gastritis 3Clips Remaining (Dr Mechele Collin) 12/18/08     1 year Cardioversion- 2008 Mitral annuloplasty 1996 HOSP [facility]  TIA  Fe Def Anemia  Chronic Afib   PAD   Chol    Low TSH  12/2-12/01/2009 CT Head w/o   Nml   09/04/2009   Carotid Dopplers   Compl Occl RICA   Sm calc plaque L Carotid Bifurc  09/04/2009 CT Angiogram Neck  Compl Occl RICA  50-60% LICA   09/04/2009  Family History: Last updated: Nov 25, 2009 Father- deceased. Lung Ca Mother is deceased not heart related Sister dec  36 unknown cause  Social History: Last updated: 02/27/2009 Occupation: retired heating and air Patient has never smoked.  Alcohol Use - yes- socially Daily Caffeine Use Illicit Drug Use - no Married- 3 children.  Vital Signs:  Patient profile:   77 year old male Height:      71 inches Weight:      185 pounds BMI:     25.90 Pulse rate:   88 / minute Pulse rhythm:   irregular Resp:     18 per minute BP sitting:   130 / 80  (left arm) Cuff size:   large  Vitals Entered By: Vikki Ports (July 16, 2010 11:58 AM)  Physical Exam  General:  Well developed, well nourished, in no acute distress. Head:  normocephalic and atraumatic Eyes:  PERRLA/EOM intact; conjunctiva and lids normal. Lungs:  Clear bilaterally to auscultation and percussion. Heart:  Irregularly irregular rhythm.  No rub. Pulses:  pulses normal in all 4 extremities Extremities:  No clubbing or cyanosis. Neurologic:  Alert and oriented x 3.  EKG  Procedure date:  07/16/2010  Findings:      Atiral fib with controlled ventricular response.  Non specific ST and T wave abnormalities.    Impression & Recommendations:  Problem # 1:  ATRIAL FIBRILLATION (ICD-427.31) Back in atrial fib at this point with controlled response.  Feels great.  No persistent symptoms despite being out of rhythm.  Discussed options.  He  would like to followup and see how he does with controlled rate.  Will reassess status in short period of time.  Of note, EF fell alot during past episodes of atrial fib.  Is on anticoagulation, and does need labs done, and perhpas repeat echo.   His updated medication list for this problem includes:    Digitek 0.25 Mg Tabs (Digoxin) ..... One by mouth once daily    Warfarin Sodium 5 Mg Tabs (Warfarin sodium) .Marland Kitchen... Take 5 mg. once daily .  Problem # 2:  ANTICOAGULATION THERAPY (ICD-V58.61) Has been tolerating.    Problem # 3:  MITRAL VALVE REPLACEMENT, HX OF (ICD-V15.1) No def murmur.  Had mitral annuloplasty in 1996.    Other Orders: EKG w/ Interpretation (93000)  Patient Instructions: 1)  Your physician recommends that you schedule a follow-up appointment in: 6 WEEKS 2)  Your physician recommends that you continue on your current medications as directed. Please refer to the Current Medication list given to you today. Prescriptions: OMEPRAZOLE 20 MG CPDR (OMEPRAZOLE) Take one by mouth daily  #90 Each x 2   Entered by:   Julieta Gutting, RN, BSN   Authorized by:   Ronaldo Miyamoto, MD, [facility]   Signed by:   Julieta Gutting, RN, BSN on 07/16/2010   Method used:   Electronically to        [facility] [address] (retail)       [address]       [place], [place]  [address]       Ph: [phone]       Fax: [phone]   RxID:   [id number]

## 2010-11-05 NOTE — Progress Notes (Signed)
Summary: refil meds  Phone Note Refill Request Call back at Home Phone [phone] Message from:  Patient on December 23, 2009 2:48 PM    Supply Requested: 3 months digoxin 0.[facility]. [facility] [address] [phone]   Method Requested: Fax to Mail Away Pharmacy Initial call taken by: Lorne Skeens,  December 23, 2009 2:50 PM  Follow-up for Phone Call        Rx faxed to pharmacy Follow-up by: Vikki Ports,  December 23, 2009 3:13 PM    Prescriptions: DIGITEK 0.25 MG TABS (DIGOXIN) one by mouth once daily  #90 x 3   Entered by:   Vikki Ports   Authorized by:   Ronaldo Miyamoto, MD, [facility]   Signed by:   Vikki Ports on 12/23/2009   Method used:   Faxed to ...       [facility] (retail)       [address]       [place], [place]  [address]       Ph: [phone]       Fax: [phone]   RxID:   [id number]

## 2010-11-05 NOTE — Miscellaneous (Signed)
  Clinical Lists Changes  Medications: Changed medication from WARFARIN SODIUM 5 MG TABS (WARFARIN SODIUM) Use as directed by Anticoagulation Clinic to WARFARIN SODIUM 5 MG TABS (WARFARIN SODIUM) Take 5 mg. once daily except 7.5 mg. on Mondays. Observations: Added new observation of INR: 4.1  (06/16/2010 17:35) Added new observation of PT PATIENT: 43.4 s (06/16/2010 17:35)

## 2010-11-05 NOTE — Progress Notes (Signed)
  Walk in Patient Form Recieved " Pt needs Refill " sent to [facility]  August 18, 2010 1:17 PM

## 2010-11-05 NOTE — Progress Notes (Signed)
Summary: Rx TAMSULOSIN  Phone Note Refill Request Call back at [phone] Message from:  [facility] on September 10, 2010 12:08 PM  Refills Requested: Medication #1:  FLOMAX 0.4 MG XR24H-CAP one tab by mouth once daily.   Last Refilled: 08/08/2010  Method Requested: Electronic    Prescriptions: FLOMAX 0.4 MG XR24H-CAP (TAMSULOSIN HCL) one tab by mouth once daily.  #30 x 12   Entered and Authorized by:   Shaune Leeks MD   Signed by:   Shaune Leeks MD on 09/10/2010   Method used:   Electronically to        [facility] (retail)       [address]       [place], [place]  [address]       Ph: [phone]       Fax: [phone]   RxID:   [id number]

## 2010-11-05 NOTE — Miscellaneous (Signed)
  Clinical Lists Changes  Medications: Changed medication from WARFARIN SODIUM 5 MG TABS (WARFARIN SODIUM) Take 5 mg. once daily except 7.5 mg. on Mondays. to WARFARIN SODIUM 5 MG TABS (WARFARIN SODIUM) Take 5 mg. once daily .

## 2010-11-05 NOTE — Letter (Signed)
Summary: [facility] Standing Order Request-PT  [facility] Standing Order Request-PT   Imported By: Beau Fanny 03/24/2010 10:39:57  _____________________________________________________________________  External Attachment:    Type:   Image     Comment:   External Document

## 2010-11-05 NOTE — Progress Notes (Signed)
Summary: needs new standing orders for protimes  Phone Note From Other Clinic   Caller: [facility] Summary of Call: Standing orders for protime have expired, [facility] has faxed a new form for you to sign.  Form is on your desk. Initial call taken by: Lowella Petties CMA, AAMA,  September 15, 2010 2:31 PM  Follow-up for Phone Call        Signed. Follow-up by: Shaune Leeks MD,  September 16, 2010 7:22 AM  Additional Follow-up for Phone Call Additional follow up Details #1::        Form faxed, placed up front for scanning.    Lowella Petties CMA, AAMA  September 16, 2010 8:15 AM

## 2010-11-05 NOTE — Letter (Signed)
Summary: Cardioversion Instructions  Architectural technologist, Main Office  [address]   [place], [place] [address]   Phone: [phone]  Fax: [phone]    Cardioversion Instructions  09/02/2010 MRN: [id number]  [name] [address] [place], [place]  [address]  Dear [name], You are scheduled for a Cardioversion on Wednesday September 09, 2010 with Dr. Riley Kill.   Please arrive at the [facility] at 11:00 a.m. on the day of your procedure.  1)   DIET:    May have clear liquid breakfast, then nothing to eat or drink after 7:00 a.m.       Clear liquids include:  water, broth, Sprite, Ginger Ale, black coffee, tea (no sugar),      cranberry / grape / apple juice, jello (not red), popsicle from clear juices (not red).  2)    MAKE SURE YOU TAKE YOUR COUMADIN.  3)   A)   DO NOT TAKE these medications before your procedure:      Do not take Digitek (digoxin) the morning of procedure.   B)   YOU MAY TAKE ALL of your remaining medications with a small amount of water.    4)  Must have a responsible person to drive you home.  5)   Bring a current list of your medications and current insurance cards.   * Special Note:  Every effort is made to have your procedure done on time. Occasionally there are emergencies that present themselves at the hospital that may cause delays. Please be patient if a delay does occur.  * If you have any questions after you get home, please call the office at [phone].

## 2010-11-05 NOTE — Assessment & Plan Note (Signed)
Summary: eph   Visit Type:  Follow-up Primary Provider:  [name]  CC:  no complaints.  History of Present Illness: Feels alot better since back in rhythm.  Feels good.  No impact from procedure.  Underwent cardioversion with successful conversion with one shock.  No consequences.  Feels good.  INR has been therapeutic.   Current Problems (verified): 1)  Rosacea  (ICD-695.3) 2)  Benign Prostatic Hypertrophy, With Urinary Obstruction  (ICD-600.01) 3)  Mitral Valve Replacement, Hx of  (ICD-V15.1) 4)  Atrial Fibrillation  (ICD-427.31) 5)  Other and Unspecified Mitral Valve Diseases  (ICD-394.9) 6)  Anticoagulation Therapy  (ICD-V58.61) 7)  Atrial Fibrillation  (ICD-427.31) 8)  Hypercholesterolemia  (ICD-272.0) 9)  Gerd  (ICD-530.81) 10)  Anemia, Iron Deficiency  (ICD-280.9) 11)  Sleep Apnea  (ICD-780.57) 12)  Left Ventricular Failure  (ICD-428.1) 13)  Gastrointestinal Hemorrhage, Hx of  (ICD-V12.79) 14)  Hiatal Hernia  (ICD-553.3) 15)  Hemorrhoids  (ICD-455.6) 16)  Diverticulosis, Colon  (ICD-562.10) 17)  Aftercare, Long-term Use, Medications Nec  (ICD-V58.69) 18)  Encounter For Therapeutic Drug Monitoring  (ICD-V58.83) 19)  Peptic Ulcer Disease, Hx of  (ICD-V12.71)  Current Medications (verified): 1)  Digitek 0.25 Mg Tabs (Digoxin) .... One By Mouth Once Daily 2)  Lasix 40 Mg  Tabs (Furosemide) .... One By Mouth Two Times A Day 3)  Enalapril Maleate 2.5 Mg  Tabs (Enalapril Maleate) .... One By Mouth Two Times A Day 4)  Klor-Con M10 10 Meq  Tbcr (Potassium Chloride Crys Cr) .... One By Mouth Two Times A Day 5)  Flomax 0.4 Mg Xr24h-Cap (Tamsulosin Hcl) .... One Tab By Mouth Once Daily. 6)  Warfarin Sodium 5 Mg Tabs (Warfarin Sodium) .... Take 5 Mg. Once Daily . 7)  Omeprazole 20 Mg Cpdr (Omeprazole) .... Take One By Mouth Daily 8)  Proscar 5 Mg Tabs (Finasteride) .... One Tab By Mouth Daily  Allergies (verified): 1)  Lipitor (Atorvastatin Calcium)  Past  History:  Past Medical History: Last updated: 02/27/2009 Current Problems:  CORONARY ARTERY DISEASE (ICD-414.00) ANTICOAGULATION THERAPY (ICD-V58.61) ATRIAL FIBRILLATION (ICD-427.31) HYPERCHOLESTEROLEMIA (ICD-272.0) GERD (ICD-530.81) NECK SPRAIN AND STRAIN (ICD-847.0) VERTIGO (ICD-780.4) ANEMIA, IRON DEFICIENCY (ICD-280.9) SLEEP APNEA (ICD-780.57) LEFT VENTRICULAR FAILURE (ICD-428.1) GASTROINTESTINAL HEMORRHAGE, HX OF (ICD-V12.79) HIATAL HERNIA (ICD-553.3) HEMORRHOIDS (ICD-455.6) DIVERTICULOSIS, COLON (ICD-562.10) AFTERCARE, LONG-TERM USE, MEDICATIONS NEC (ICD-V58.69) ENCOUNTER FOR THERAPEUTIC DRUG MONITORING (ICD-V58.83) PEPTIC ULCER DISEASE, HX OF (ICD-V12.71)  Vital Signs:  Patient profile:   77 year old male Height:      71 inches Weight:      187 pounds BMI:     26.18 Pulse rate:   79 / minute Resp:     16 per minute BP sitting:   166 / 93  (right arm)  Vitals Entered By: Marrion Coy, CNA (September 15, 2010 10:48 AM)  Physical Exam  General:  Well developed, well nourished, in no acute distress. Head:  normocephalic and atraumatic Lungs:  Clear bilaterally to auscultation and percussion. Heart:  regular rhythm.  No murmur Abdomen:  Bowel sounds positive; abdomen soft and non-tender without masses, organomegaly, or hernias noted. No hepatosplenomegaly. Pulses:  pulses normal in all 4 extremities Extremities:  No clubbing or cyanosis. Neurologic:  Alert and oriented x 3.   EKG  Procedure date:  09/15/2010  Findings:      NSR.  Delay in R wave progression.   Cannot exlude old ASMI.  Non specific T abnormality.   Impression & Recommendations:  Problem # 1:  ATRIAL FIBRILLATION (ICD-427.31) Successful  cardioversion.  LVEF clearly declines in setting of AF.  Have suggested a repeat echo to assess function back in NSR.  Had discussed her case with Dr. Graciela Husbands.  Reasess LVEF now back in NSR.  Hopefully will hold, if not may likely need measures to restore and  maintain NSR.  Not on Amiodarone candidate.  His updated medication list for this problem includes:    Digitek 0.25 Mg Tabs (Digoxin) ..... One by mouth once daily    Warfarin Sodium 5 Mg Tabs (Warfarin sodium) .Marland Kitchen... Take 5 mg. once daily .  Orders: EKG w/ Interpretation (93000) Echocardiogram (Echo)  Problem # 2:  ANTICOAGULATION THERAPY (ICD-V58.61) Will need to watch Hgb.  Problem # 3:  MITRAL VALVE REPLACEMENT, HX OF (ICD-V15.1) History of repair.    Patient Instructions: 1)  Your physician recommends that you schedule a follow-up appointment in: 1 MONTH 2)  Your physician recommends that you continue on your current medications as directed. Please refer to the Current Medication list given to you today. 3)  Your physician has requested that you have an echocardiogram in 1 MONTH.  Echocardiography is a painless test that uses sound waves to create images of your heart. It provides your doctor with information about the size and shape of your heart and how well your heart's chambers and valves are working.  This procedure takes approximately one hour. There are no restrictions for this procedure.

## 2010-11-05 NOTE — Progress Notes (Signed)
Summary: refill meds  Phone Note Refill Request Call back at Home Phone [phone] Message from:  Patient on August 20, 2010 11:01 AM  Refills Requested: Medication #1:  KLOR-CON M10 10 MEQ  TBCR one by mouth two times a day   Supply Requested: 3 months  Medication #2:  WARFARIN SODIUM 5 MG TABS Take 5 mg. once daily .   Supply Requested: [facility] [address]    Method Requested: Fax to Local Pharmacy Initial call taken by: Lorne Skeens,  August 20, 2010 11:02 AM  Follow-up for Phone Call        Klor-con refill faxed to pharmacy . Warfarin refill request sended to [facility]. Vikki Ports  August 20, 2010 3:13 PM   Warfarin was filled by Dr Para March. Julieta Gutting, RN, BSN  August 20, 2010 5:22 PM    Prescriptions: KLOR-CON M10 10 MEQ  TBCR (POTASSIUM CHLORIDE CRYS CR) one by mouth two times a day  #180 x 3   Entered by:   Vikki Ports   Authorized by:   Ronaldo Miyamoto, MD, [facility]   Signed by:   Vikki Ports on 08/20/2010   Method used:   F[facility]ed to ...       [facility] [address]retail)       [address]       [place], [place]  [address]       Ph: [phone]       Fax: [phone]   RxID:   [id number]

## 2010-11-05 NOTE — Assessment & Plan Note (Signed)
Summary: CPX/RBH   Vital Signs:  Patient profile:   77 year old male Weight:      188.75 pounds Temp:     97.6 degrees F oral Pulse rate:   92 / minute Pulse rhythm:   regular BP sitting:   138 / 70  (left arm) Cuff size:   regular  Vitals Entered By: Sydell Axon LPN (November 12, 2009 1:53 PM) CC: 30 minute checkup, had a colonoscopy 04/08 by Dr. Corinda Gubler   History of Present Illness: Pt here for followup. He is urinating well with Flomax. He is tolerating it well. He has no complaints.  Preventive Screening-Counseling & Management  Alcohol-Tobacco     Alcohol drinks/day: 1     Alcohol type: beer     Smoking Status: never  Caffeine-Diet-Exercise     Caffeine use/day: 3 cups, one soft drink     Does Patient Exercise: yes     Type of exercise: walks daily     Exercise (avg: min/session): 20-30     Times/week: 7  Problems Prior to Update: 1)  Benign Prostatic Hypertrophy, With Urinary Obstruction  (ICD-600.01) 2)  Mitral Valve Replacement, Hx of  (ICD-V15.1) 3)  Atrial Fibrillation  (ICD-427.31) 4)  Other and Unspecified Mitral Valve Diseases  (ICD-394.9) 5)  Anticoagulation Therapy  (ICD-V58.61) 6)  Atrial Fibrillation  (ICD-427.31) 7)  Hypercholesterolemia  (ICD-272.0) 8)  Gerd  (ICD-530.81) 9)  Neck Sprain and Strain  (ICD-847.0) 10)  Vertigo  (ICD-780.4) 11)  Anemia, Iron Deficiency  (ICD-280.9) 12)  Sleep Apnea  (ICD-780.57) 13)  Left Ventricular Failure  (ICD-428.1) 14)  Gastrointestinal Hemorrhage, Hx of  (ICD-V12.79) 15)  Hiatal Hernia  (ICD-553.3) 16)  Hemorrhoids  (ICD-455.6) 17)  Diverticulosis, Colon  (ICD-562.10) 18)  Aftercare, Long-term Use, Medications Nec  (ICD-V58.69) 19)  Encounter For Therapeutic Drug Monitoring  (ICD-V58.83) 20)  Peptic Ulcer Disease, Hx of  (ICD-V12.71)  Medications Prior to Update: 1)  Digitek 0.25 Mg Tabs (Digoxin) .... One By Mouth Once Daily 2)  Lasix 40 Mg  Tabs (Furosemide) .... One By Mouth Two Times A Day 3)   Enalapril Maleate 2.5 Mg  Tabs (Enalapril Maleate) .... One By Mouth Two Times A Day 4)  Klor-Con M10 10 Meq  Tbcr (Potassium Chloride Crys Cr) .... One By Mouth Two Times A Day 5)  Flomax 0.4 Mg Xr24h-Cap (Tamsulosin Hcl) .... One Tab By Mouth Once Daily. 6)  Coumadin 5 Mg Tabs (Warfarin Sodium) .... Take As Directed 7)  Omeprazole 20 Mg Cpdr (Omeprazole) .... Take One By Mouth Daily  Allergies: 1)  Lipitor (Atorvastatin Calcium)  Past History:  Past Medical History: Last updated: 02/27/2009 Current Problems:  CORONARY ARTERY DISEASE (ICD-414.00) ANTICOAGULATION THERAPY (ICD-V58.61) ATRIAL FIBRILLATION (ICD-427.31) HYPERCHOLESTEROLEMIA (ICD-272.0) GERD (ICD-530.81) NECK SPRAIN AND STRAIN (ICD-847.0) VERTIGO (ICD-780.4) ANEMIA, IRON DEFICIENCY (ICD-280.9) SLEEP APNEA (ICD-780.57) LEFT VENTRICULAR FAILURE (ICD-428.1) GASTROINTESTINAL HEMORRHAGE, HX OF (ICD-V12.79) HIATAL HERNIA (ICD-553.3) HEMORRHOIDS (ICD-455.6) DIVERTICULOSIS, COLON (ICD-562.10) AFTERCARE, LONG-TERM USE, MEDICATIONS NEC (ICD-V58.69) ENCOUNTER FOR THERAPEUTIC DRUG MONITORING (ICD-V58.83) PEPTIC ULCER DISEASE, HX OF (ICD-V12.71)  Past Surgical History: Last updated: 09/11/2009 mitral valve repair 1995 Cholecystectomy partial gastrectomy Appendectomy EGD Brushing of area near prior anastamosis (Dr Jarold Motto) 04/17/2008 HOSP UGI Bleed Elevated INR Coumadin stopped Sinusitis 7/15- 04/22/2008 Left Catarract Repair [facility] 10/13/2008 EGD Gastritis 3Clips Remaining (Dr Mechele Collin) 12/18/08     1 year Cardioversion- 2008 Mitral annuloplasty 1996 HOSP [facility]  TIA  Fe Def Anemia  Chronic Afib   PAD   Chol  Low TSH  12/2-12/01/2009 CT Head w/o   Nml   09/04/2009   Carotid Dopplers   Compl Occl RICA   Sm calc plaque L Carotid Bifurc  09/04/2009 CT Angiogram Neck  Compl Occl RICA  50-60% LICA   09/04/2009  Family History: Last updated: 15-Nov-2009 Father- deceased. 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  Alcohol Use - yes- socially Daily Caffeine Use Illicit Drug Use - no Married- 3 children.  Risk Factors: Alcohol Use: 1 (11/15/09) Caffeine Use: 3 cups, one soft drink (11/15/09) Exercise: yes (2009/11/15)  Risk Factors: Smoking Status: never (11-15-09)  Family History: Father- deceased. Lung Ca Mother is deceased not heart related Sister dec  108 unknown cause  Social History: Caffeine use/day:  3 cups, one soft drink Does Patient Exercise:  yes  Review of Systems General:  Denies chills, fatigue, fever, sweats, weakness, and weight loss. Eyes:  Denies blurring, discharge, eye irritation, and eye pain; catarract removed last year [facility]. ENT:  Denies decreased hearing, ear discharge, earache, and ringing in ears. CV:  Denies chest pain or discomfort, fainting, fatigue, palpitations, shortness of breath with exertion, and swelling of feet. Resp:  Denies cough, shortness of breath, and wheezing. GI:  Denies abdominal pain, bloody stools, change in bowel habits, constipation, dark tarry stools, diarrhea, indigestion, loss of appetite, nausea, vomiting, vomiting blood, and yellowish skin color. GU:  Denies dysuria, nocturia, urinary frequency, and urinary hesitancy. MS:  Denies joint pain, joint redness, joint swelling, low back pain, muscle aches, cramps, and stiffness. Derm:  Complains of rash; denies dryness and itching; rash on nose. Neuro:  Denies numbness, poor balance, tingling, and tremors.  Physical Exam  General:  Well developed, well nourished, in no acute distress. Head:  Normocephalic and atraumatic without obvious abnormalities. No apparent alopecia or balding. Sinuses NT. Eyes:  Conjunctiva clear bilaterally.  Ears:  External ear exam shows no significant lesions or deformities.  Otoscopic examination reveals clear canals, tympanic  membranes are intact bilaterally without bulging, retraction, inflammation or discharge. Hearing is grossly normal bilaterally. Nose:  External nasal examination shows no deformity or inflammation. Nasal mucosa are pink and moist without lesions or exudates. Mouth:  Oral mucosa and oropharynx without lesions or exudates.  Teeth in good repair. Neck:  No deformities, masses, or tenderness noted. Chest Wall:  No deformities, masses, tenderness or gynecomastia noted. Breasts:  No masses or gynecomastia noted Lungs:  Normal respiratory effort, chest expands symmetrically. Lungs are clear to auscultation, no crackles or wheezes. Heart:  Normal rate, well controlled, sounds regular.. S1 and S2 normal without gallop, murmur, click, rub or other extra sounds. Abdomen:  Bowel sounds positive,abdomen soft and non-tender without masses, organomegaly or hernias noted. Rectal:  No external abnormalities noted. Normal sphincter tone. No rectal masses or tenderness. Decompressed circumf external hemms. G neg. Genitalia:  Testes bilaterally descended without nodularity, tenderness or masses. No scrotal masses or lesions. No penis lesions or urethral discharge. Prostate:  Prostate gland firm and smooth, no enlargement, nodularity, tenderness, mass, asymmetry or induration. 40 gms. Msk:  No deformity or scoliosis noted of thoracic or lumbar spine.   Pulses:  R and L carotid,radial,femoral,dorsalis pedis and posterior tibial pulses are full and equal bilaterally Extremities:  No clubbing, cyanosis, edema, or deformity noted with normal full range of motion of all joints except right knee has crepitus.  Neurologic:  No cranial nerve deficits noted. Station and gait are normal. Sensory, motor and coordinative functions appear intact. Skin:  Intact without suspicious lesions or rashes, multiple benign moles. Nose with inflammatory changes of the skin distally. Cervical Nodes:  No lymphadenopathy noted Inguinal Nodes:   No significant adenopathy Psych:  Cognition and judgment appear intact. Alert and cooperative with normal attention span and concentration. No apparent delusions, illusions, hallucinations   Impression & Recommendations:  Problem # 1:  BENIGN PROSTATIC HYPERTROPHY, WITH URINARY OBSTRUCTION (ICD-600.01) Assessment Improved Better on Flomax. Add Finasteride and will eventually stop Flomax after about 6 months or more.  Problem # 2:  ATRIAL FIBRILLATION (ICD-427.31) Assessment: Unchanged  Stable with well controlled rate. His updated medication list for this problem includes:    Digitek 0.25 Mg Tabs (Digoxin) ..... One by mouth once daily    Coumadin 5 Mg Tabs (Warfarin sodium) .Marland Kitchen... Take as directed  Reviewed the following: PT: 17.8 (10/24/2008)   INR: 2.1 (10/24/2008) Next Protime: 2 weeks (dated on 10/24/2008)  Problem # 3:  HYPERCHOLESTEROLEMIA (ICD-272.0) LDL too huigh. Tolerated Vytorin excepty in the pocketbook. Did not tolerate Lipitor. Will try Pravastatin. Labs Reviewed: SGOT: 21 (11/05/2009)   SGPT: 19 (11/05/2009)   HDL:50.50 (11/05/2009), 38.1 (04/27/2007)  LDL:92 (04/27/2007)  Chol:223 (11/05/2009), 161 (04/27/2007)  Trig:116.0 (11/05/2009), 153 (04/27/2007)  Problem # 4:  ANEMIA, IRON DEFICIENCY (ICD-280.9) Assessment: Improved Better but still has low iron with subsequent anemia. Increas oral intake to two a day. Hgb: 10.3 (11/05/2009)   Hct: 32.8 (11/05/2009)   Platelets: 242.0 (11/05/2009) RBC: 4.81 (11/05/2009)   RDW: 18.4 H % (11/05/2009)   WBC: 4.2 (11/05/2009) MCV: 68.0 L fl (11/05/2009)   MCHC: 31.5 (11/05/2009) Ferritin: 12.0 (04/09/2008) Iron: 14 (11/05/2009)   % Sat: 3.1 (04/09/2008) B12: 303 (11/05/2009)   Folate: 16.9 (04/09/2008)   TSH: 0.11 Repeated and verified X2. uIU/mL (06/11/2009)  Problem # 5:  HEMORRHOIDS (ICD-455.6) Assessment: Unchanged Decompressed.  Problem # 6:  DIVERTICULOSIS, COLON (ICD-562.10) Assessment: Unchanged Discussed being  seen for discomfort in the LLQ for more than two days.  Complete Medication List: 1)  Digitek 0.25 Mg Tabs (Digoxin) .... One by mouth once daily 2)  Lasix 40 Mg Tabs (Furosemide) .... One by mouth two times a day 3)  Enalapril Maleate 2.5 Mg Tabs (Enalapril maleate) .... One by mouth two times a day 4)  Klor-con M10 10 Meq Tbcr (Potassium chloride crys cr) .... One by mouth two times a day 5)  Flomax 0.4 Mg Xr24h-cap (Tamsulosin hcl) .... One tab by mouth once daily. 6)  Coumadin 5 Mg Tabs (Warfarin sodium) .... Take as directed 7)  Omeprazole 20 Mg Cpdr (Omeprazole) .... Take one by mouth daily 8)  Proscar 5 Mg Tabs (Finasteride) .... One tab by mouth daily  Patient Instructions: 1)  SGOT, SGPT 272. 0   6 WEEKS 2)  See me in 3 mos, SGOT, SGPT, CHOL PROFILE  272.0 prior 3)  Take iron pill twice a day. 4)  Start Pravachol in one week at night.  5)  In 3 weeks, start Proscar (Finasteride) 5mg  a day. 6)  Metrogel for nose in 3 mos. Prescriptions: PROSCAR 5 MG TABS (FINASTERIDE) one tab by mouth daily  #30 x 12   Entered and Authorized by:   Shaune Leeks MD   Signed by:   Shaune Leeks MD on 11/12/2009   Method used:   Electronically to        [facility]  [address] (retail)       [address]       [place], [place]  [address]       Ph: [phone]       Fax: [phone]   RxID:   [id number]   Current Allergies (reviewed today): LIPITOR (ATORVASTATIN CALCIUM)  Appended Document: CPX/RBH   Tetanus/Td Vaccine    Vaccine Type: Td    Site: left deltoid    Mfr: Sanofi Pasteur    Dose: 0.5 ml    Route: IM    Given by: Sydell Axon LPN    Exp. Date: 08/19/2011    Lot #: [id number]    VIS given: 08/22/07 version given November 12, 2009.

## 2010-11-05 NOTE — Consult Note (Signed)
[facility]ist, [facility]  [facility]   Imported By: Marylou Mccoy 12/17/2009 12:16:20  _____________________________________________________________________  External Attachment:    Type:   Image     Comment:   External Document  Appended Document: [facility] reviewed.tS

## 2010-11-05 NOTE — Letter (Signed)
Summary: [facility] Standing Order Request-PT  [facility] Standing Order Request-PT   Imported By: Beau Fanny 09/16/2010 10:41:41  _____________________________________________________________________  External Attachment:    Type:   Image     Comment:   External Document

## 2010-11-05 NOTE — Letter (Signed)
Summary: [facility] EKG  [facility] EKG   Imported By: Roderic Ovens 04/16/2010 10:12:27  _____________________________________________________________________  External Attachment:    Type:   Image     Comment:   External Document

## 2010-11-05 NOTE — Assessment & Plan Note (Signed)
Summary: ROV   Visit Type:  follow-up echo done today Primary Provider:  [name]  CC:  no complaints.  History of Present Illness: In for followup.  Underwent cardioversion for treatment of AF.  His LV tends to get worse, even though he is often asymptomatic until it is severe.Marland Kitchen  He is doing better, and thinks that the cardioversion helped, although he was not doing poorly when it occurred.    Problems Prior to Update: 1)  Rosacea  (ICD-695.3) 2)  Benign Prostatic Hypertrophy, With Urinary Obstruction  (ICD-600.01) 3)  Mitral Valve Replacement, Hx of  (ICD-V15.1) 4)  Atrial Fibrillation  (ICD-427.31) 5)  Other and Unspecified Mitral Valve Diseases  (ICD-394.9) 6)  Anticoagulation Therapy  (ICD-V58.61) 7)  Atrial Fibrillation  (ICD-427.31) 8)  Hypercholesterolemia  (ICD-272.0) 9)  Gerd  (ICD-530.81) 10)  Anemia, Iron Deficiency  (ICD-280.9) 11)  Sleep Apnea  (ICD-780.57) 12)  Left Ventricular Failure  (ICD-428.1) 13)  Gastrointestinal Hemorrhage, Hx of  (ICD-V12.79) 14)  Hiatal Hernia  (ICD-553.3) 15)  Hemorrhoids  (ICD-455.6) 16)  Diverticulosis, Colon  (ICD-562.10) 17)  Aftercare, Long-term Use, Medications Nec  (ICD-V58.69) 18)  Encounter For Therapeutic Drug Monitoring  (ICD-V58.83) 19)  Peptic Ulcer Disease, Hx of  (ICD-V12.71)  Current Medications (verified): 1)  Digitek 0.25 Mg Tabs (Digoxin) .... One By Mouth Once Daily 2)  Lasix 40 Mg  Tabs (Furosemide) .... One By Mouth Two Times A Day 3)  Enalapril Maleate 2.5 Mg  Tabs (Enalapril Maleate) .... One By Mouth Two Times A Day 4)  Klor-Con M10 10 Meq  Tbcr (Potassium Chloride Crys Cr) .... One By Mouth Two Times A Day 5)  Flomax 0.4 Mg Xr24h-Cap (Tamsulosin Hcl) .... One Tab By Mouth Once Daily. 6)  Warfarin Sodium 5 Mg Tabs (Warfarin Sodium) .... Take 5 Mg. Once Daily . 7)  Omeprazole 20 Mg Cpdr (Omeprazole) .... Take One By Mouth Daily 8)  Proscar 5 Mg Tabs (Finasteride) .... One Tab By Mouth  Daily  Allergies: 1)  Lipitor (Atorvastatin Calcium)  Past History:  Past Medical History: Last updated: 02/27/2009 Current Problems:  CORONARY ARTERY DISEASE (ICD-414.00) ANTICOAGULATION THERAPY (ICD-V58.61) ATRIAL FIBRILLATION (ICD-427.31) HYPERCHOLESTEROLEMIA (ICD-272.0) GERD (ICD-530.81) NECK SPRAIN AND STRAIN (ICD-847.0) VERTIGO (ICD-780.4) ANEMIA, IRON DEFICIENCY (ICD-280.9) SLEEP APNEA (ICD-780.57) LEFT VENTRICULAR FAILURE (ICD-428.1) GASTROINTESTINAL HEMORRHAGE, HX OF (ICD-V12.79) HIATAL HERNIA (ICD-553.3) HEMORRHOIDS (ICD-455.6) DIVERTICULOSIS, COLON (ICD-562.10) AFTERCARE, LONG-TERM USE, MEDICATIONS NEC (ICD-V58.69) ENCOUNTER FOR THERAPEUTIC DRUG MONITORING (ICD-V58.83) PEPTIC ULCER DISEASE, HX OF (ICD-V12.71)  Past Surgical History: Last updated: 08/19/2010 mitral valve repair 1995 Cholecystectomy partial gastrectomy Appendectomy EGD Brushing of area near prior anastamosis (Dr Jarold Motto) 04/17/2008 HOSP UGI Bleed Elevated INR Coumadin stopped Sinusitis 7/15- 04/22/2008 [facility] 10/13/2008 EGD Gastritis 3Clips Remaining (Dr Mechele Collin) 12/18/08     1 year Cardioversion- 2008 Mitral annuloplasty 1996 HOSP [facility]  TIA  Fe Def Anemia  Chronic Afib   PAD   Chol    Low TSH  12/2-12/01/2009 CT Head w/o   Nml   09/04/2009   Carotid Dopplers   Compl Occl RICA   Sm calc plaque L Carotid Bifurc  09/04/2009 CT Angiogram Neck  Compl Occl RICA  50-60% LICA   09/04/2009 Echo Mod LVH EF 25% Triv AR Mild LAE, RAE 08/18/2010  Vital Signs:  Patient profile:   77 year old male Height:      71 inches Weight:      189.50 pounds BMI:     26.53 Pulse rate:   80 /  minute Pulse rhythm:   regular Resp:     18 per minute BP sitting:   140 / 80  (left arm)  Vitals Entered By: Vikki Ports (October 20, 2010 12:06 PM)  Physical Exam  General:  Well developed, well nourished, in no acute distress. Head:  normocephalic and atraumatic Eyes:  PERRLA/EOM intact;  conjunctiva and lids normal. Lungs:  Clear bilaterally to auscultation and percussion. Heart:  PMI laterally displaced.  Prominent MR murmur not heard.  Regular today.. Abdomen:  Bowel sounds positive; abdomen soft and non-tender without masses, organomegaly, or hernias noted. No hepatosplenomegaly. Pulses:  pulses normal in all 4 extremities Extremities:  No clubbing or cyanosis. Neurologic:  Alert and oriented x 3.   EKG  Procedure date:  10/20/2010  Findings:      NSR.  Poor R wave progression.  Non specific T wave abnormality.  Impression & Recommendations:  Problem # 1:  ATRIAL FIBRILLATION (ICD-427.31) Maintaining NSR on very little medication.  Options if he recurrs involve possible ablation, or antiarrythmic therapy.  He tends to hold for pretty good periods.  We will need assessment of EF in about four months.  He typically improves if he maintains normal rhythm.. His updated medication list for this problem includes:    Digitek 0.25 Mg Tabs (Digoxin) ..... One by mouth once daily    Warfarin Sodium 5 Mg Tabs (Warfarin sodium) .Marland Kitchen... Take 5 mg. once daily .  Orders: EKG w/ Interpretation (93000)  Problem # 2:  MITRAL VALVE REPLACEMENT, HX OF (ICD-V15.1) Had valve repair, not replacement.  EF is down, but in setting of atrial fib.  Hopefully will recover.  Echo reveiwed with patient in detail today, and does appear somewhat improved since November, and cardioversion.    Patient Instructions: 1)  Your physician recommends that you schedule a follow-up appointment in: 2 MONTHS 2)  Your physician recommends that you continue on your current medications as directed. Please refer to the Current Medication list given to you today.

## 2010-11-05 NOTE — Assessment & Plan Note (Signed)
Summary: ROA 3 MTHS CYD   Vital Signs:  Patient profile:   77 year old male Weight:      189.25 pounds Temp:     97.6 degrees F oral Pulse rate:   88 / minute Pulse rhythm:   regular BP sitting:   154 / 84  (left arm) Cuff size:   large  Vitals Entered By: Sydell Axon LPN (Feb 10, 2010 2:16 PM) CC: 3 Month follow-up   History of Present Illness: Pt here for 3 month followup. He did not get Pravachol. He did start Proscar. He also was not given Metrogel but saw Dr Jarold Motto, Derm in [place]. He put him on Metrogel and is tolerating ok.  Allergies: 1)  Lipitor (Atorvastatin Calcium)  Physical Exam  General:  Well developed, well nourished, in no acute distress. Head:  Normocephalic and atraumatic without obvious abnormalities. No apparent alopecia or balding. Sinuses NT. Eyes:  Conjunctiva clear bilaterally.  Ears:  External ear exam shows no significant lesions or deformities.  Otoscopic examination reveals clear canals, tympanic membranes are intact bilaterally without bulging, retraction, inflammation or discharge. Hearing is grossly normal bilaterally. Nose:  External nasal examination shows no deformity or inflammation. Nasal mucosa are pink and moist without lesions or exudates. Mouth:  Oral mucosa and oropharynx without lesions or exudates.  Teeth in good repair. Neck:  No deformities, masses, or tenderness noted. Lungs:  Normal respiratory effort, chest expands symmetrically. Lungs are clear to auscultation, no crackles or wheezes. Heart:  Normal rate, well controlled, sounds regular.. S1 and S2 normal without gallop, murmur, click, rub or other extra sounds.   Impression & Recommendations:  Problem # 1:  HYPERCHOLESTEROLEMIA (ICD-272.0) Assessment New Will try red yeast rice. Start coenzyme Q 10. Hasn't been on Pravachol, labs basically  the same   Problem # 2:  ROSACEA (ICD-695.3) Assessment: Unchanged Stable.  Problem # 3:  ANEMIA, IRON DEFICIENCY  (ICD-280.9) Assessment: Unchanged Unchanged, check next time...on two times a day. Hgb: 10.3 (11/05/2009)   Hct: 32.8 (11/05/2009)   Platelets: 242.0 (11/05/2009) RBC: 4.81 (11/05/2009)   RDW: 18.4 H % (11/05/2009)   WBC: 4.2 (11/05/2009) MCV: 68.0 L fl (11/05/2009)   MCHC: 31.5 (11/05/2009) Ferritin: 12.0 (04/09/2008) Iron: 14 (11/05/2009)   % Sat: 3.1 (04/09/2008) B12: 303 (11/05/2009)   Folate: 16.9 (04/09/2008)   TSH: 0.11 Repeated and verified X2. uIU/mL (06/11/2009)  Problem # 4:  BENIGN PROSTATIC HYPERTROPHY, WITH URINARY OBSTRUCTION (ICD-600.01) Assessment: Improved On proscar.  Complete Medication List: 1)  Digitek 0.25 Mg Tabs (Digoxin) .... One by mouth once daily 2)  Lasix 40 Mg Tabs (Furosemide) .... One by mouth two times a day 3)  Enalapril Maleate 2.5 Mg Tabs (Enalapril maleate) .... One by mouth two times a day 4)  Klor-con M10 10 Meq Tbcr (Potassium chloride crys cr) .... One by mouth two times a day 5)  Flomax 0.4 Mg Xr24h-cap (Tamsulosin hcl) .... One tab by mouth once daily. 6)  Warfarin Sodium 5 Mg Tabs (Warfarin sodium) .... Use as directed by anticoagulation clinic 7)  Omeprazole 20 Mg Cpdr (Omeprazole) .... Take one by mouth daily 8)  Proscar 5 Mg Tabs (Finasteride) .... One tab by mouth daily  Patient Instructions: 1)  Start Coenzyme Q 10. 2)  Then start Red yeast rice after a week.  3)  Call in Jun for appt in Oct, labs prior.   Current Allergies (reviewed today): LIPITOR (ATORVASTATIN CALCIUM)

## 2010-11-05 NOTE — Assessment & Plan Note (Signed)
Summary: ROV   Visit Type:  Follow-up Primary Provider:  [name]  CC:  No cardiac complains.  History of Present Illness: Doing well.  Cholesterol a little high.  Now taking 2 iron pills.  No chest pain, and not out of rhythm.   Doing well overall.  Saw Dr. Hetty Ely.  Long discussion with the patient about anemia, but he does not want further followup labs until see by Dr. Hetty Ely.  He has tolerated warfarin, and has had GI followup thoroughly.  His AF is associated with valvular disease, with prior treatment with surgery, but without recurrent MR.    Current Medications (verified): 1)  Digitek 0.25 Mg Tabs (Digoxin) .... One By Mouth Once Daily 2)  Lasix 40 Mg  Tabs (Furosemide) .... One By Mouth Two Times A Day 3)  Enalapril Maleate 2.5 Mg  Tabs (Enalapril Maleate) .... One By Mouth Two Times A Day 4)  Klor-Con M10 10 Meq  Tbcr (Potassium Chloride Crys Cr) .... One By Mouth Two Times A Day 5)  Flomax 0.4 Mg Xr24h-Cap (Tamsulosin Hcl) .... One Tab By Mouth Once Daily. 6)  Warfarin Sodium 5 Mg Tabs (Warfarin Sodium) .... Use As Directed By Anticoagulation Clinic 7)  Omeprazole 20 Mg Cpdr (Omeprazole) .... Take One By Mouth Daily 8)  Proscar 5 Mg Tabs (Finasteride) .... One Tab By Mouth Daily  Allergies: 1)  Lipitor (Atorvastatin Calcium)  Past History:  Past Medical History: Last updated: 02/27/2009 Current Problems:  CORONARY ARTERY DISEASE (ICD-414.00) ANTICOAGULATION THERAPY (ICD-V58.61) ATRIAL FIBRILLATION (ICD-427.31) HYPERCHOLESTEROLEMIA (ICD-272.0) GERD (ICD-530.81) NECK SPRAIN AND STRAIN (ICD-847.0) VERTIGO (ICD-780.4) ANEMIA, IRON DEFICIENCY (ICD-280.9) SLEEP APNEA (ICD-780.57) LEFT VENTRICULAR FAILURE (ICD-428.1) GASTROINTESTINAL HEMORRHAGE, HX OF (ICD-V12.79) HIATAL HERNIA (ICD-553.3) HEMORRHOIDS (ICD-455.6) DIVERTICULOSIS, COLON (ICD-562.10) AFTERCARE, LONG-TERM USE, MEDICATIONS NEC (ICD-V58.69) ENCOUNTER FOR THERAPEUTIC DRUG MONITORING  (ICD-V58.83) PEPTIC ULCER DISEASE, HX OF (ICD-V12.71)  Past Surgical History: Last updated: 09/11/2009 mitral valve repair 1995 Cholecystectomy partial gastrectomy Appendectomy EGD Brushing of area near prior anastamosis (Dr Jarold Motto) 04/17/2008 HOSP UGI Bleed Elevated INR Coumadin stopped Sinusitis 7/15- 04/22/2008 Left Catarract Repair [facility] 10/13/2008 EGD Gastritis 3Clips Remaining (Dr Mechele Collin) 12/18/08     1 year Cardioversion- 2008 Mitral annuloplasty 1996 HOSP [facility]  TIA  Fe Def Anemia  Chronic Afib   PAD   Chol    Low TSH  12/2-12/01/2009 CT Head w/o   Nml   09/04/2009   Carotid Dopplers   Compl Occl RICA   Sm calc plaque L Carotid Bifurc  09/04/2009 CT Angiogram Neck  Compl Occl RICA  50-60% LICA   09/04/2009  Family History: Last updated: 2009-11-16 Father- deceased. Lung Ca Mother is deceased not heart related Sister dec  72 unknown cause  Social History: Last updated: 02/27/2009 Occupation: retired heating and air Patient has never smoked.  Alcohol Use - yes- socially Daily Caffeine Use Illicit Drug Use - no Married- 3 children.  Risk Factors: Alcohol Use: 1 (Nov 16, 2009) Caffeine Use: 3 cups, one soft drink (2009/11/16) Exercise: yes (2009/11/16)  Risk Factors: Smoking Status: never (November 16, 2009)  Vital Signs:  Patient profile:   77 year old male Height:      71 inches Weight:      189 pounds BMI:     26.46 Pulse rate:   79 / minute Pulse rhythm:   regular Resp:     18 per minute BP sitting:   126 / 80  (left arm) Cuff size:   large  Vitals Entered By: Vikki Ports (January 22, 2010 10:17 AM)  Physical Exam  General:  Well developed, well nourished, in no acute distress. Head:  normocephalic and atraumatic Eyes:  PERRLA/EOM intact; conjunctiva and lids normal. Ears:  TM's intact and clear with normal canals and hearing Neck:  Neck supple, no JVD. No masses, thyromegaly or abnormal cervical nodes. Lungs:  Clear bilaterally to auscultation and  percussion. Heart:  Normal PMI  .  Not really displaced.  Minimal murmur.  Abdomen:  Bowel sounds positive; abdomen soft and non-tender without masses, organomegaly, or hernias noted. No hepatosplenomegaly.   EKG  Procedure date:  01/22/2010  Findings:      NSR.  Nonspecific T abnormality.    Impression & Recommendations:  Problem # 1:  MITRAL VALVE REPLACEMENT, HX OF (ICD-V15.1)  stable at present.    Orders: EKG w/ Interpretation (93000)  Problem # 2:  ATRIAL FIBRILLATION (ICD-427.31)  No recurrence. Currently on warfarin, but with iron deficiency.   His updated medication list for this problem includes:    Digitek 0.25 Mg Tabs (Digoxin) ..... One by mouth once daily    Warfarin Sodium 5 Mg Tabs (Warfarin sodium) ..... Use as directed by anticoagulation clinic  Orders: EKG w/ Interpretation (93000)  Problem # 3:  ANTICOAGULATION THERAPY (ICD-V58.61) Discussed all the options with him.  Had valvular disease with Mitral repair, but not other risk factors.  Discussed need for CBC followup but he wants to follow for now with followup with Dr. Hetty Ely.  Remains on iron.  Options regarding anticoagulation discussed.    Patient Instructions: 1)  Your physician recommends that you continue on your current medications as directed. Please refer to the Current Medication list given to you today. 2)  Your physician wants you to follow-up in:  6 MONTHS.  You will receive a reminder letter in the mail two months in advance. If you don't receive a letter, please call our office to schedule the follow-up appointment.

## 2010-11-05 NOTE — Letter (Signed)
Summary: [facility]  [facility]   Imported By: Beau Fanny 10/31/2009 09:39:05  _____________________________________________________________________  External Attachment:    Type:   Image     Comment:   External Document

## 2010-11-05 NOTE — Assessment & Plan Note (Signed)
Summary: F/U/CLE   Vital Signs:  Patient profile:   77 year old male Weight:      187 pounds Temp:     97.2 degrees F oral Pulse rate:   100 / minute Pulse rhythm:   irregular BP sitting:   130 / 78  (left arm) Cuff size:   large  Vitals Entered By: Sydell Axon LPN (July 16, 2010 2:12 PM) CC: Follow-up after labs, saw cardiologist this morning   History of Present Illness: Pt here for followup from labs drawn to assess blood situation and chol. He is intolerant to any statin. He has been on redyeast rice for months. He has eggs and cheese for breakfast every day!! He just saw Dr Riley Kill this AM who said his heart rate is slightly fast and may need something done next time. He feels well and is not aware of his heartbeat.  Problems Prior to Update: 1)  Rosacea  (ICD-695.3) 2)  Benign Prostatic Hypertrophy, With Urinary Obstruction  (ICD-600.01) 3)  Mitral Valve Replacement, Hx of  (ICD-V15.1) 4)  Atrial Fibrillation  (ICD-427.31) 5)  Other and Unspecified Mitral Valve Diseases  (ICD-394.9) 6)  Anticoagulation Therapy  (ICD-V58.61) 7)  Atrial Fibrillation  (ICD-427.31) 8)  Hypercholesterolemia  (ICD-272.0) 9)  Gerd  (ICD-530.81) 10)  Anemia, Iron Deficiency  (ICD-280.9) 11)  Sleep Apnea  (ICD-780.57) 12)  Left Ventricular Failure  (ICD-428.1) 13)  Gastrointestinal Hemorrhage, Hx of  (ICD-V12.79) 14)  Hiatal Hernia  (ICD-553.3) 15)  Hemorrhoids  (ICD-455.6) 16)  Diverticulosis, Colon  (ICD-562.10) 17)  Aftercare, Long-term Use, Medications Nec  (ICD-V58.69) 18)  Encounter For Therapeutic Drug Monitoring  (ICD-V58.83) 19)  Peptic Ulcer Disease, Hx of  (ICD-V12.71)  Medications Prior to Update: 1)  Digitek 0.25 Mg Tabs (Digoxin) .... One By Mouth Once Daily 2)  Lasix 40 Mg  Tabs (Furosemide) .... One By Mouth Two Times A Day 3)  Enalapril Maleate 2.5 Mg  Tabs (Enalapril Maleate) .... One By Mouth Two Times A Day 4)  Klor-Con M10 10 Meq  Tbcr (Potassium Chloride Crys Cr)  .... One By Mouth Two Times A Day 5)  Flomax 0.4 Mg Xr24h-Cap (Tamsulosin Hcl) .... One Tab By Mouth Once Daily. 6)  Warfarin Sodium 5 Mg Tabs (Warfarin Sodium) .... Take 5 Mg. Once Daily . 7)  Omeprazole 20 Mg Cpdr (Omeprazole) .... Take One By Mouth Daily 8)  Proscar 5 Mg Tabs (Finasteride) .... One Tab By Mouth Daily  Allergies: 1)  Lipitor (Atorvastatin Calcium)  Physical Exam  General:  Well developed, well nourished, in no acute distress. Head:  Normocephalic and atraumatic without obvious abnormalities. No apparent alopecia or balding. Sinuses NT. Eyes:  Conjunctiva clear bilaterally.  Ears:  External ear exam shows no significant lesions or deformities.  Otoscopic examination reveals clear canals, tympanic membranes are intact bilaterally without bulging, retraction, inflammation or discharge. Hearing is grossly normal bilaterally. Nose:  External nasal examination shows no deformity or inflammation. Nasal mucosa are pink and moist without lesions or exudates. Mouth:  Oral mucosa and oropharynx without lesions or exudates.  Teeth in good repair. Neck:  No deformities, masses, or tenderness noted. Lungs:  Normal respiratory effort, chest expands symmetrically. Lungs are clear to auscultation, no crackles or wheezes. Heart:  Irreg, irreg heartbeat with varying rate, 80s-100s.    Impression & Recommendations:  Problem # 1:  ATRIAL FIBRILLATION (ICD-427.31) Assessment Unchanged  Stable and assx. Cont curr meds. His updated medication list for this problem includes:  Digitek 0.25 Mg Tabs (Digoxin) ..... One by mouth once daily    Warfarin Sodium 5 Mg Tabs (Warfarin sodium) .Marland Kitchen... Take 5 mg. once daily .  Reviewed the following: PT: 43.4 (06/16/2010)   INR: 4.1 (06/16/2010) Next Protime: 2 weeks (dated on 10/24/2008)  Problem # 2:  HYPERCHOLESTEROLEMIA (ICD-272.0) Assessment: Unchanged LDL still too high biut little to do. Discussed avoiding eggs and milk products, to  include cheese. Labs Reviewed: SGOT: 19 (07/13/2010)   SGPT: 16 (07/13/2010)   HDL:48.20 (07/13/2010), 43.40 (02/09/2010)  LDL:92 (04/27/2007)  Chol:255 (07/13/2010), 232 (02/09/2010)  Trig:193.0 (07/13/2010), 224.0 (02/09/2010)  Problem # 3:  ANEMIA, IRON DEFICIENCY (ICD-280.9) Assessment: Improved H/H better but still iron def....cont replacement. Hgb: 13.4 (07/13/2010)   Hct: 40.4 (07/13/2010)   Platelets: 243.0 (07/13/2010) RBC: 5.21 (07/13/2010)   RDW: 17.5 (07/13/2010)   WBC: 5.6 (07/13/2010) MCV: 77.6 (07/13/2010)   MCHC: 33.0 (07/13/2010) Ferritin: 12.0 (04/09/2008) Iron: 31 (07/13/2010)   % Sat: 3.1 (04/09/2008) B12: 314 (07/13/2010)   Folate: 16.9 (04/09/2008)   TSH: 0.11 Repeated and verified X2. uIU/mL (06/11/2009)  Complete Medication List: 1)  Digitek 0.25 Mg Tabs (Digoxin) .... One by mouth once daily 2)  Lasix 40 Mg Tabs (Furosemide) .... One by mouth two times a day 3)  Enalapril Maleate 2.5 Mg Tabs (Enalapril maleate) .... One by mouth two times a day 4)  Klor-con M10 10 Meq Tbcr (Potassium chloride crys cr) .... One by mouth two times a day 5)  Flomax 0.4 Mg Xr24h-cap (Tamsulosin hcl) .... One tab by mouth once daily. 6)  Warfarin Sodium 5 Mg Tabs (Warfarin sodium) .... Take 5 mg. once daily . 7)  Omeprazole 20 Mg Cpdr (Omeprazole) .... Take one by mouth daily 8)  Proscar 5 Mg Tabs (Finasteride) .... One tab by mouth daily  Other Orders: Flu Vaccine 63yrs + MEDICARE PATIENTS (G9562) Administration Flu vaccine - MCR (Z3086)  Patient Instructions: 1)  RTC 6 mos Comp Exam labs prior  Current Allergies (reviewed today): LIPITOR (ATORVASTATIN CALCIUM)  Flu Vaccine Consent Questions     Do you have a history of severe allergic reactions to this vaccine? no    Any prior history of allergic reactions to egg and/or gelatin? no    Do you have a sensitivity to the preservative Thimersol? no    Do you have a past history of Guillan-Barre Syndrome? no    Do you currently  have an acute febrile illness? no    Have you ever had a severe reaction to latex? no    Vaccine information given and explained to patient? yes    Are you currently pregnant? no    Lot Number:[id number]   Exp Date:04/03/2011   Site Given  Left Deltoid [id number]

## 2010-11-05 NOTE — Assessment & Plan Note (Signed)
Summary: f6w   Visit Type:  Follow-up Primary [name]:  [name]  CC:  no complaints.  History of Present Illness: Feels pretty good.  However, is not aware he is out of rhythm.  Of note, EF is now back down to 25%, having improved previously with restoring NSR.  See echo reports in sequence.  Was admitted in 2008, which he did not like, because of EF of 10%, and volume overload.  Not SOB at present.   Current Medications (verified): 1)  Digitek 0.25 Mg Tabs (Digoxin) .... One By Mouth Once Daily 2)  Lasix 40 Mg  Tabs (Furosemide) .... One By Mouth Two Times A Day 3)  Enalapril Maleate 2.5 Mg  Tabs (Enalapril Maleate) .... One By Mouth Two Times A Day 4)  Klor-Con M10 10 Meq  Tbcr (Potassium Chloride Crys Cr) .... One By Mouth Two Times A Day 5)  Flomax 0.4 Mg Xr24h-Cap (Tamsulosin Hcl) .... One Tab By Mouth Once Daily. 6)  Warfarin Sodium 5 Mg Tabs (Warfarin Sodium) .... Take 5 Mg. Once Daily . 7)  Omeprazole 20 Mg Cpdr (Omeprazole) .... Take One By Mouth Daily 8)  Proscar 5 Mg Tabs (Finasteride) .... One Tab By Mouth Daily  Allergies (verified): 1)  Lipitor (Atorvastatin Calcium)  Past History:  Past Medical History: Last updated: 02/27/2009 Current Problems:  CORONARY ARTERY DISEASE (ICD-414.00) ANTICOAGULATION THERAPY (ICD-V58.61) ATRIAL FIBRILLATION (ICD-427.31) HYPERCHOLESTEROLEMIA (ICD-272.0) GERD (ICD-530.81) NECK SPRAIN AND STRAIN (ICD-847.0) VERTIGO (ICD-780.4) ANEMIA, IRON DEFICIENCY (ICD-280.9) SLEEP APNEA (ICD-780.57) LEFT VENTRICULAR FAILURE (ICD-428.1) GASTROINTESTINAL HEMORRHAGE, HX OF (ICD-V12.79) HIATAL HERNIA (ICD-553.3) HEMORRHOIDS (ICD-455.6) DIVERTICULOSIS, COLON (ICD-562.10) AFTERCARE, LONG-TERM USE, MEDICATIONS NEC (ICD-V58.69) ENCOUNTER FOR THERAPEUTIC DRUG MONITORING (ICD-V58.83) PEPTIC ULCER DISEASE, HX OF (ICD-V12.71)  Vital Signs:  Patient profile:   77 year old male Height:      71 inches Weight:      188 pounds BMI:      26.32 Pulse rate:   86 / minute Resp:     16 per minute BP sitting:   120 / 82  (right arm)  Vitals Entered By: Marrion Coy, CNA (September 02, 2010 11:00 AM)  Physical Exam  General:  Well developed, well nourished, in no acute distress. Head:  normocephalic and atraumatic Eyes:  PERRLA/EOM intact; conjunctiva and lids normal. Lungs:  Clear bilaterally to auscultation and percussion. Heart:  No definite murmur.  PMI hard to feel.  No S4. Abdomen:  Bowel sounds positive; abdomen soft and non-tender without masses, organomegaly, or hernias noted. No hepatosplenomegaly. Pulses:  pulses normal in all 4 extremities Extremities:  No clubbing or cyanosis.  No edema Neurologic:  Alert and oriented x 3.   EKG  Procedure date:  09/02/2010  Findings:      Atrial fib with intermediate rate control of ventricular response.  Poor R wave progression, but no definite Q waves.   Echocardiogram  Procedure date:  08/18/2010  Findings:      Study Conclusions            - Left ventricle: The cavity size was moderately dilated. Wall       thickness was normal. Systolic function was mildly reduced. The       estimated ejection fraction was 25%. Diffuse hypokinesis.     - Aortic valve: Trivial regurgitation.     - Mitral valve: Prior procedures included surgical repair. Valve       area by continuity equation (using LVOT flow): 1.75cm 2.     - Left atrium: The  atrium was mildly to moderately dilated.     - Right atrium: The atrium was mildly dilated.  Impression & Recommendations:  Problem # 1:  ATRIAL FIBRILLATION (ICD-427.31) back out of rhythm, with again drop in ventricular function despite intermediate rate control.  Reviewed with Dr. Graciela Husbands and would favor repeat cardioversion.  Will refer to EP for further evaluation of additional options.  Tikosyn has been suggested.  Developed drop in TSH on Amiodarone and it was stopped. Have discussed cardioversion with patient and he consents.  PT  INR have been ok. His updated medication list for this problem includes:    Digitek 0.25 Mg Tabs (Digoxin) ..... One by mouth once daily    Warfarin Sodium 5 Mg Tabs (Warfarin sodium) .Marland Kitchen... Take 5 mg. once daily .  Orders: EKG w/ Interpretation (93000) TLB-BMP (Basic Metabolic Panel-BMET) (80048-METABOL) TLB-CBC Platelet - w/Differential (85025-CBCD) TLB-PT (Protime) (85610-PTP)  Problem # 2:  MITRAL VALVE REPLACEMENT, HX OF (ICD-V15.1) Mitral repair with adequate response.    Patient Instructions: 1)  Your physician has recommended that you have a cardioversion (DCCV).  Electrical cardioversion uses a jolt of electricity to your heart either through paddles or wired patches attached to your chest. This is a controlled, usually prescheduled, procedure. Defibrillation is done under light anesthesia in the hospital, and you usually go home the day of the procedure. This is done to get your heart back into a normal rhythm. You are not awake for the procedure. Please see the instruction sheet given to you today. 2)  Your physician recommends that you continue on your current medications as directed. Please refer to the Current Medication list given to you today. Prescriptions: KLOR-CON M10 10 MEQ  TBCR (POTASSIUM CHLORIDE CRYS CR) one by mouth two times a day  #180 x 3   Entered by:   Julieta Gutting, RN, BSN   Authorized by:   Ronaldo Miyamoto, MD, [facility]   Signed by:   Julieta Gutting, RN, BSN on 09/02/2010   Method used:   Electronically to        [facility]  [address] (retail)       [address]       [place], [place]  [address]       Ph: [phone]       Fax: [phone]   RxID:   [id number] ENALAPRIL MALEATE 2.5 MG  TABS (ENALAPRIL MALEATE) one by mouth two times a day  #180 Each x 3   Entered by:   Julieta Gutting, RN, BSN   Authorized by:   Ronaldo Miyamoto, MD, [facility]   Signed by:   Julieta Gutting, RN, BSN on 09/02/2010   Method used:   Electronically to         [facility]  [address] (retail)       [address]       [place], [place]  [address]       Ph: [phone]       Fax: [phone]   RxID:   [id number] LASIX 40 MG  TABS (FUROSEMIDE) one by mouth two times a day  #180 Each x 3   Entered by:   Julieta Gutting, RN, BSN   Authorized by:   Ronaldo Miyamoto, MD, [facility]   Signed by:   Julieta Gutting, RN, BSN on 09/02/2010   Method used:   Electronically to  [facility]  [address] (retail)       [address]       [place], [place]  [address]       Ph: [phone]       Fax: [phone]   RxID:   [id number] DIGITEK 0.25 MG TABS (DIGOXIN) one by mouth once daily  #90 x 3   Entered by:   Julieta Gutting, RN, BSN   Authorized by:   Ronaldo Miyamoto, MD, [facility]   Signed by:   Julieta Gutting, RN, BSN on 09/02/2010   Method used:   Electronically to        [facility]  [address] (retail)       [address]       [place], [place]  [address]       Ph: [phone]       Fax: [phone]   RxID:   [id number]

## 2010-11-05 NOTE — Miscellaneous (Signed)
Summary: Orders Update  Clinical Lists Changes  Orders: Added new Referral order of Echocardiogram (Echo) - Signed 

## 2010-11-05 NOTE — Letter (Signed)
Summary: Nadara Eaton letter  [place] [facility]  [address] [place], [place] [address]   Phone: [phone]  Fax: [phone]       05/12/2010 MRN: [id number]  [name] [address] [place], [place]  [address]  Dear [name],  [facility] [place], and [facility] announce the retirement of Arta Silence, M.D., from full-time practice at the [facility] office effective April 02, 2010 and his plans of returning part-time.  It is important to Dr. Hetty Ely and to our practice that you understand that [facility] has seven physicians in our office for your health care needs.  We will continue to offer the same exceptional care that you have today.    Dr. Hetty Ely has spoken to many of you about his plans for retirement and returning part-time in the fall.   We will continue to work with you through the transition to schedule appointments for you in the office and meet the high standards that [facility] is committed to.   Again, it is with great pleasure that we share the news that Dr. Hetty Ely will return to [facility] at [facility] in October of 2011 with a reduced schedule.    If you have any questions, or would like to request an appointment with one of our physicians, please call us at [phone] and press the option for Scheduling an appointment.  We take pleasure in providing you with excellent patient care and look forward to seeing you at your next office visit.  Our [facility] Physicians are:  Tillman Abide, M.D. Laurita Quint, M.D. Roxy Manns, M.D. Kerby Nora, M.D. Hannah Beat, M.D. Ruthe Mannan, M.D. We proudly welcomed Raechel Ache, M.D. and Eustaquio Boyden, M.D. to the practice in July/August 2011.  Sincerely,   [facility]

## 2010-11-18 ENCOUNTER — Encounter: Payer: Self-pay | Admitting: Family Medicine

## 2010-12-03 ENCOUNTER — Encounter: Payer: Self-pay | Admitting: Family Medicine

## 2010-12-14 LAB — PROTIME-INR
INR: 2.38 — ABNORMAL HIGH (ref 0.00–1.49)
Prothrombin Time: 26.1 seconds — ABNORMAL HIGH (ref 11.6–15.2)

## 2010-12-23 ENCOUNTER — Other Ambulatory Visit: Payer: Self-pay | Admitting: Family Medicine

## 2010-12-30 ENCOUNTER — Encounter: Payer: Self-pay | Admitting: Cardiology

## 2010-12-31 ENCOUNTER — Ambulatory Visit: Payer: Self-pay | Admitting: Cardiology

## 2010-12-31 ENCOUNTER — Encounter: Payer: Self-pay | Admitting: Cardiology

## 2010-12-31 ENCOUNTER — Ambulatory Visit (INDEPENDENT_AMBULATORY_CARE_PROVIDER_SITE_OTHER): Payer: Medicare Other | Admitting: Cardiology

## 2010-12-31 VITALS — BP 143/83 | HR 90 | Resp 18 | Ht 71.0 in | Wt 188.0 lb

## 2010-12-31 DIAGNOSIS — I4891 Unspecified atrial fibrillation: Secondary | ICD-10-CM

## 2010-12-31 NOTE — Patient Instructions (Signed)
Your physician recommends that you schedule a follow-up appointment in: 3 MONTHS  Your physician recommends that you continue on your current medications as directed. Please refer to the Current Medication list given to you today.   

## 2010-12-31 NOTE — Assessment & Plan Note (Signed)
Has history of repair not replacement.  Monitor.  Stable.

## 2010-12-31 NOTE — Assessment & Plan Note (Signed)
Patient has LV dysfunction when he goes in to af.  Last echo improved with EF of 30-35%.  Currently wants to defer echo for three more months.  Will monitor.  Any change clinically I have encouraged him to call.

## 2010-12-31 NOTE — Assessment & Plan Note (Signed)
Maintaining NSR at present.  EF drops when he is not in NSR.  Will continue current regimen.  He is stable at present.   Remains on warfarin with therapeutic INR.  Should have labs with Dr. Hetty Ely.

## 2010-12-31 NOTE — Progress Notes (Signed)
HPI:  He is doing well.  He wants to put off evaluation for three more months regarding EF.  He feels great, and is remaining stable at the present time. Denies any chest pain or shortness of breath.  Feels really good.    Current Outpatient Prescriptions  Medication Sig Dispense Refill  . digoxin (LANOXIN) 0.25 MG tablet Take 250 mcg by mouth daily.        . enalapril (VASOTEC) 2.5 MG tablet Take one by mouth two times a day.        . finasteride (PROSCAR) 5 MG tablet TAKE ONE TABLET BY MOUTH EVERY DAY  30 tablet  1  . furosemide (LASIX) 40 MG tablet Take 40 mg by mouth daily.       Marland Kitchen omeprazole (PRILOSEC) 20 MG capsule Take 20 mg by mouth daily.        . potassium chloride (KLOR-CON 10) 10 MEQ CR tablet Take 10 mEq by mouth daily.       . Tamsulosin HCl (FLOMAX) 0.4 MG CAPS Take 0.4 mg by mouth daily.        Marland Kitchen warfarin (COUMADIN) 5 MG tablet Take 5 mg by mouth daily.          Allergies  Allergen Reactions  . Atorvastatin     REACTION: unspecified    Past Medical History  Diagnosis Date  . Coronary atherosclerosis of unspecified type of vessel, native or graft 2008    cardioversion. Echo mod LVH EF 25%, Triv AR  Mild LAE, RAE 11/15/*11  . Encounter for long-term (current) use of anticoagulants   . Atrial fibrillation   . Pure hypercholesterolemia   . Esophageal reflux   . Sprain of neck   . Dizziness and giddiness   . Iron deficiency anemia, unspecified   . Unspecified sleep apnea   . Left heart failure   . Personal history of other diseases of digestive system   . Diaphragmatic hernia without mention of obstruction or gangrene   . Unspecified hemorrhoids without mention of complication   . Diverticulosis of colon (without mention of hemorrhage)   . Encounter for long-term (current) use of other medications   . Encounter for therapeutic drug monitoring   . Personal history of peptic ulcer disease   . Gastritis 12/18/08    EGD, 3 clips remaining (Dr. Mechele Collin)  . Upper GI  bleed 7/15-7/20/09    Hosp.  Elevated INR, coumadin stopped sinusitis    Past Surgical History  Procedure Date  . Mitral valve repair 1995  . Cholecystectomy   . Partial gastrectomy   . Appendectomy   . Cataract extraction 10/13/08    left  . Mitral valve annuloplasty 1996    Family History  Problem Relation Age of Onset  . Cancer Father     lung    History   Social History  . Marital Status: Married    Spouse Name: N/A    Number of Children: 3  . 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 Smoking status: Never Smoker   . Smokeless tobacco: Not on file  . Alcohol Use: Yes     socially  . Drug Use: No  . Sexually Active: Not on file   Other Topics Concern  . Not on file   Social History Narrative   Daily caffeine use.    ROS: Please see the HPI.  All  other systems reviewed and negative.  PHYSICAL EXAM:  BP 143/83  Pulse 90  Resp 18  Ht 5\' 11"  (1.803 m)  Wt 188 lb (85.276 kg)  BMI 26.22 kg/m2  General: Well developed, well nourished, in no acute distress. Head:  Normocephalic and atraumatic. Neck: no JVD Lungs: Clear to auscultation and percussion. Heart: Normal S1 and S2.  No murmur, rubs or gallops.  Abdomen:  Normal bowel sounds; soft; non tender; no organomegaly Pulses: Pulses normal in all 4 extremities. Extremities: No clubbing or cyanosis. No edema. Neurologic: Alert and oriented x 3.  EKG:  NSR.  Cannot exlude ASMI, old.  Non specific ST and T abnormality.  Previously noted.   ASSESSMENT AND PLAN:

## 2011-01-04 ENCOUNTER — Encounter: Payer: Self-pay | Admitting: Family Medicine

## 2011-01-13 ENCOUNTER — Telehealth: Payer: Self-pay | Admitting: Cardiology

## 2011-01-13 NOTE — Telephone Encounter (Signed)
I spoke with the pt and he had a root canal and now his tooth cracked.  The pt needs to have the tooth extracted tomorrow.  I asked the pt if the dentist was requesting that the pt hold his coumadin prior to extraction and he said no.  I told the pt that he can proceed with extraction while on coumadin but to expect some risk of increased bleeding.  The pt will make the dentist aware. If the dentist does not want to pull the tooth while the pt is on Coumadin then the pt will call our office back for further instructions. Pt agreed with plan. The pt does have a history of mitral valve repair

## 2011-01-13 NOTE — Telephone Encounter (Signed)
Pt states he needs to speak with lauren re dental work he going to have tomorrow. Pt needs to know if he need to come off of his blood thinner.

## 2011-01-13 NOTE — Telephone Encounter (Signed)
I spoke with Dr Riley Kill about this pt and he said the pt could hold coumadin the day of procedure and then resume on Friday.  I made the pt aware of Dr Rosalyn Charters recommendation.  I also confirmed with the pt that he is taking antibiotics (SBE) prior to dental work.  The pt has been taking SBE as instructed.

## 2011-01-14 ENCOUNTER — Other Ambulatory Visit: Payer: Self-pay | Admitting: Family Medicine

## 2011-01-14 DIAGNOSIS — I4891 Unspecified atrial fibrillation: Secondary | ICD-10-CM

## 2011-01-14 DIAGNOSIS — D509 Iron deficiency anemia, unspecified: Secondary | ICD-10-CM

## 2011-01-14 DIAGNOSIS — K5730 Diverticulosis of large intestine without perforation or abscess without bleeding: Secondary | ICD-10-CM

## 2011-01-14 DIAGNOSIS — K219 Gastro-esophageal reflux disease without esophagitis: Secondary | ICD-10-CM

## 2011-01-14 DIAGNOSIS — E7800 Pure hypercholesterolemia, unspecified: Secondary | ICD-10-CM

## 2011-01-14 DIAGNOSIS — N401 Enlarged prostate with lower urinary tract symptoms: Secondary | ICD-10-CM

## 2011-01-18 ENCOUNTER — Other Ambulatory Visit (INDEPENDENT_AMBULATORY_CARE_PROVIDER_SITE_OTHER): Payer: Medicare Other | Admitting: Family Medicine

## 2011-01-18 DIAGNOSIS — E785 Hyperlipidemia, unspecified: Secondary | ICD-10-CM

## 2011-01-18 DIAGNOSIS — D509 Iron deficiency anemia, unspecified: Secondary | ICD-10-CM

## 2011-01-18 DIAGNOSIS — K219 Gastro-esophageal reflux disease without esophagitis: Secondary | ICD-10-CM

## 2011-01-18 DIAGNOSIS — I4891 Unspecified atrial fibrillation: Secondary | ICD-10-CM

## 2011-01-18 DIAGNOSIS — K5730 Diverticulosis of large intestine without perforation or abscess without bleeding: Secondary | ICD-10-CM

## 2011-01-18 DIAGNOSIS — Z79899 Other long term (current) drug therapy: Secondary | ICD-10-CM

## 2011-01-18 DIAGNOSIS — E7800 Pure hypercholesterolemia, unspecified: Secondary | ICD-10-CM

## 2011-01-18 LAB — CBC WITH DIFFERENTIAL/PLATELET
Eosinophils Absolute: 0.2 10*3/uL (ref 0.0–0.7)
Eosinophils Relative: 3.2 % (ref 0.0–5.0)
MCV: 84.8 fl (ref 78.0–100.0)
Monocytes Absolute: 0.6 10*3/uL (ref 0.1–1.0)
Neutrophils Relative %: 37.9 % — ABNORMAL LOW (ref 43.0–77.0)
Platelets: 209 10*3/uL (ref 150.0–400.0)
WBC: 4.8 10*3/uL (ref 4.5–10.5)

## 2011-01-18 LAB — HEPATIC FUNCTION PANEL
Albumin: 3.7 g/dL (ref 3.5–5.2)
Alkaline Phosphatase: 46 U/L (ref 39–117)
Bilirubin, Direct: 0.1 mg/dL (ref 0.0–0.3)
Total Bilirubin: 0.8 mg/dL (ref 0.3–1.2)

## 2011-01-18 LAB — BASIC METABOLIC PANEL
Calcium: 8.7 mg/dL (ref 8.4–10.5)
GFR: 111.08 mL/min (ref >60.00)
Glucose, Bld: 92 mg/dL (ref 70–99)
Sodium: 135 mEq/L (ref 135–145)

## 2011-01-18 LAB — IRON: Iron: 52 ug/dL (ref 42–165)

## 2011-01-18 LAB — LIPID PANEL
HDL: 50.7 mg/dL (ref >39.00)
VLDL: 36 mg/dL (ref 0.0–40.0)

## 2011-01-18 LAB — PROTIME-INR
INR: 3 ratio — ABNORMAL HIGH (ref 0.8–1.0)
Prothrombin Time: 30.4 s — ABNORMAL HIGH (ref 10.2–12.4)

## 2011-01-20 ENCOUNTER — Encounter: Payer: Self-pay | Admitting: Family Medicine

## 2011-01-20 ENCOUNTER — Ambulatory Visit (INDEPENDENT_AMBULATORY_CARE_PROVIDER_SITE_OTHER): Payer: Medicare Other | Admitting: Family Medicine

## 2011-01-20 VITALS — BP 148/86 | HR 84 | Temp 97.2°F | Ht 70.5 in | Wt 186.0 lb

## 2011-01-20 DIAGNOSIS — N401 Enlarged prostate with lower urinary tract symptoms: Secondary | ICD-10-CM

## 2011-01-20 DIAGNOSIS — K5730 Diverticulosis of large intestine without perforation or abscess without bleeding: Secondary | ICD-10-CM

## 2011-01-20 DIAGNOSIS — I10 Essential (primary) hypertension: Secondary | ICD-10-CM

## 2011-01-20 DIAGNOSIS — L719 Rosacea, unspecified: Secondary | ICD-10-CM

## 2011-01-20 DIAGNOSIS — D509 Iron deficiency anemia, unspecified: Secondary | ICD-10-CM

## 2011-01-20 DIAGNOSIS — I4891 Unspecified atrial fibrillation: Secondary | ICD-10-CM

## 2011-01-20 DIAGNOSIS — E7800 Pure hypercholesterolemia, unspecified: Secondary | ICD-10-CM

## 2011-01-20 MED ORDER — ENALAPRIL MALEATE 5 MG PO TABS: 5.0000 mg | ORAL_TABLET | Freq: Two times a day (BID) | ORAL | Status: DC

## 2011-01-20 NOTE — Assessment & Plan Note (Signed)
Stable. Tamsulosin working adequately.

## 2011-01-20 NOTE — Assessment & Plan Note (Addendum)
LDL high. Intolerant of statins..have tried multiple times. Is on Red Yeast Rice. Suggest incr dose. Cont Co Q 10. Cont Fish Oil. Again discussed diet and exercise.

## 2011-01-20 NOTE — Patient Instructions (Addendum)
RTC 3 mos, BMET prior 401.9 Needs Zostavax and Pneumovax next time.

## 2011-01-20 NOTE — Assessment & Plan Note (Addendum)
High today. Will increase Enalapril to 5mg  bid.  Recheck in 3 mos. Bmet prior. BP Readings from Last 3 Encounters:  01/20/11 148/86  12/31/10 143/83  10/20/10 140/80

## 2011-01-20 NOTE — Assessment & Plan Note (Signed)
Stable. Sees Derm every 6 months.

## 2011-01-20 NOTE — Assessment & Plan Note (Signed)
Discussed being seen for p[rolonged LLQ discomfort. 

## 2011-01-20 NOTE — Progress Notes (Signed)
  Subjective:    Patient ID: [name], male    DOB: November 11, 1932, 77 y.o.   MRN: [id number]  HPI Pt here for Comp Exam. He has seen Dr Riley Kill earlier this spring. He has no complaints and feels well.    Review of Systems  Constitutional: Negative for fever, chills, diaphoresis, appetite change, fatigue and unexpected weight change.  HENT: Positive for tinnitus (occas). Negative for hearing loss, ear pain and ear discharge.   Eyes: Negative for pain, discharge and visual disturbance.  Respiratory: Negative for cough, shortness of breath and wheezing.   Cardiovascular: Negative for chest pain and palpitations.       No SOB w/ exertion  Gastrointestinal: Positive for diarrhea (occas). Negative for nausea, vomiting, abdominal pain, constipation and blood in stool.       No heartburn or swallowing problems.  Genitourinary: Negative for dysuria, frequency and difficulty urinating.       No nocturia  Musculoskeletal: Negative for myalgias, back pain and arthralgias.  Skin: Negative for rash.       No itching or dryness.  Neurological: Negative for tremors and numbness.       No tingling or balance problems.  Hematological: Negative for adenopathy. Does not bruise/bleed easily.  Psychiatric/Behavioral: Negative for dysphoric mood and agitation.       Objective:   Physical Exam  Constitutional: He is oriented to person, place, and time. He appears well-developed and well-nourished. No distress.  HENT:  Head: Normocephalic and atraumatic.  Right Ear: External ear normal.  Left Ear: External ear normal.  Nose: Nose normal.  Mouth/Throat: Oropharynx is clear and moist.  Eyes: Conjunctivae and EOM are normal. Pupils are equal, round, and reactive to light. Right eye exhibits no discharge. Left eye exhibits no discharge. No scleral icterus.  Neck: Normal range of motion. Neck supple. No thyromegaly present.  Cardiovascular: Normal rate, regular rhythm, normal heart sounds and  intact distal pulses.   No murmur heard. Pulmonary/Chest: Effort normal and breath sounds normal. No respiratory distress. He has no wheezes.  Abdominal: Soft. Bowel sounds are normal. He exhibits no distension and no mass. There is no tenderness. There is no rebound and no guarding.  Musculoskeletal: Normal range of motion. He exhibits no edema.  Lymphadenopathy:    He has no cervical adenopathy.  Neurological: He is alert and oriented to person, place, and time. Coordination normal.  Skin: Skin is warm and dry. No rash noted. He is not diaphoretic.  Psychiatric: He has a normal mood and affect. His behavior is normal. Judgment and thought content normal.          Assessment & Plan:  Comp Exam

## 2011-01-20 NOTE — Assessment & Plan Note (Signed)
Improved. Cont curr therapy.

## 2011-01-20 NOTE — Assessment & Plan Note (Signed)
Rate well controlled. Cont curr meds.

## 2011-02-01 ENCOUNTER — Other Ambulatory Visit: Payer: Self-pay | Admitting: *Deleted

## 2011-02-01 MED ORDER — FINASTERIDE 5 MG PO TABS: 5.0000 mg | ORAL_TABLET | Freq: Every day | ORAL | Status: DC

## 2011-02-16 NOTE — Assessment & Plan Note (Signed)
[facility]ALTHCARE                            CARDIOLOGY OFFICE NOTE   KERT, SHACKETT                  MRN:          [id number]  DATE:11/26/2007                            DOB:          [birth year]    [name] is in for follow-up.  In general, he has felt relatively  stable.  He has an occasional episode of some lightheadedness.  Otherwise he has continued to do well.   Medications include Coumadin daily, multivitamin, Vytorin 10/40 daily,  Lasix 40 mg b.i.d., digoxin 0.25 mg daily, enalapril 2.5 mg b.i.d.,  amiodarone 200 mg daily.  Omeprazole 20 mg daily, Klor-Con 10 mEq b.i.d.   PHYSICAL EXAMINATION:  He is alert and oriented in no distress.  Blood pressure is 138/78.  The pulse is 84.  The lung fields are clear.  His cardiac exam is basically unchanged.   Electrocardiogram today demonstrates normal sinus rhythm.  There is a  delay in R wave progression seen on previous tracings.  Nonspecific ST-T  abnormalities.  TSH is 0.51, potassium 4.7, sodium 134, BUN 5,  creatinine 0.8.  Hemoglobin is 12.8, hematocrit 39.3 and MCV is 79.   The patient remained stable.  I will continue to see him in follow-up.  With regard to his dizziness, the exact etiology that is unclear.  We  will potentially evaluate that further.     Arturo Morton. Riley Kill, MD, [facility]  Electronically Signed    TDS/MedQ  DD: 11/26/2007  DT: 11/26/2007  Job #: [id number]

## 2011-02-16 NOTE — Assessment & Plan Note (Signed)
[facility] HEALTHCARE                            CARDIOLOGY OFFICE NOTE   WAYNE, WICKLUND                  MRN:          [id number]  DATE:03/14/2007                            DOB:          [birth year]    Mr. Broadus is in to follow up.  Since yesterday, the patient underwent  2D echocardiography.  Preliminary study suggests significant reduction  in overall left ventricular function.  He has been somewhat short of  breath, although he is able to get around.  The findings are compatible  with what we know about his situation.  The patient has had a prior  mitral valve repair.  He had atrial fibrillation previously and was  successfully cardioverted.  He has not tolerated the atrial fibrillation  very well, and his rate is not well controlled.  He has been adequately  anticoagulated.  The best possible option I think at this time would be  to go ahead and arrange for him to be admitted.  We tried to arrange  admission today, but he would prefer to come in tomorrow.  When he comes  in, we will need to recheck his pro time, and we will have him take an  extra dose of Coumadin tonight because his INR is 2.  He has been  adequately anticoagulated for some time.  With this in mind, he may  require TEE-guided cardioversion or alternatively, because he has been  adequately anticoagulated, he can be returned to sinus rhythm.  Whether  or not he should be started on amiodarone prior to this is uncertain but  if his LV function remains down, he also will require a device, although  my hope is that his LV function will improve after restoration to sinus  rhythm.  I will be out of town, but I will try to arrange this with my  partners.     Arturo Morton. Riley Kill, MD, [facility]  Electronically Signed    TDS/MedQ  DD: 03/14/2007  DT: 03/14/2007  Job #: [id number]

## 2011-02-16 NOTE — Assessment & Plan Note (Signed)
[facility] HEALTHCARE                            CARDIOLOGY OFFICE NOTE   PERICLES, CARMICHEAL                  MRN:          [id number]  DATE:05/29/2008                            DOB:          Mar 15, 1933    Mr. Mazzocco is in for followup.  He really is doing quite well.  He had  a complicated hospital course after he bled following an esophageal  biopsy.  He had multiple transfusions.  He was recently discharged from  the hospital where he was cared for by Dr. Mechele Collin.  Dr. Mechele Collin called  me yesterday.  We talked about the duration of time off of Coumadin.  The patient has largely been in sinus rhythm, not in and out of atrial  fibrillation at least on a knowledgeable basis.  The patient also has  only been on Coumadin about 4 weeks and Dr. Mechele Collin told me he generally  would prefer him off about 3-4 months.  But he also understands the  potential risks associated with stroke.  As a result, we talked about  the possibilities of bringing Mr. Testa back in another month or two  and specifically perhaps in about 4 weeks and starting him on Coumadin  with an INR goal of about 1.7-2.0.  I reviewed this in detail today with  Mr. Talmadge Coventry.   Today, on examination he is alert today.   MEDICATIONS:  1. Multivitamin.  2. Lasix 40 mg b.i.d.  3. Digoxin 0.25 mg daily.  4. Enalapril 2.5 daily.  5. Klor-Con 10 mEq b.i.d.  6. Omeprazole 20 mg daily.  7. Amiodarone 200 mg daily.   PHYSICAL EXAMINATION:  Blood pressure is 130/78, pulse is 81.  The lung  fields are clear and the cardiac rhythm is regular.  It is basically  unchanged.  Extremities show no edema.   His electrocardiogram demonstrates normal sinus rhythm.  There is a  delayed R-wave progression.   IMPRESSION:  1. Status post recent gastrointestinal bleed following esophageal      biopsy.  2. Status post cardioversion for recurrent paroxysmal atrial      fibrillation, now back in normal sinus  rhythm on amiodarone.  3. Left ventricular dysfunction.  4. Status post mitral valve repair 1995.  5. History of gastrointestinal bleed.  6. History of mild coronary artery disease.   PLAN:  1. Return to clinic in 4 weeks.  2. At that time, we will initiate Coumadin therapy with a goal INR 1.7-      2.0.  All this information was reviewed with the patient in detail.     Arturo Morton. Riley Kill, MD, [facility]  Electronically Signed    TDS/MedQ  DD: 05/29/2008  DT: 05/30/2008  Job #: [id number]

## 2011-02-16 NOTE — Assessment & Plan Note (Signed)
[facility] HEALTHCARE                            CARDIOLOGY OFFICE NOTE   LUNDEN, MCLEISH                  MRN:          [id number]  DATE:03/29/2007                            DOB:          1933/04/19    [name] is seen today in follow.  He was recently discharged from  the hospital after undergoing cardioversion.  He was initially treated  with Lovenox, and placed on Coumadin.  His INR was well above 3 and the  Lovenox was subsequently discontinued.  Dr. Hetty Ely then adjusted his  Coumadin; and today, unfortunately, his INR is 1.  He has been  instructed to take additional Coumadin; so he will take 10 mg a day,  then 5 mg a day until next week; and he will resume his previous dose of  2.5 on Tuesday and Thursday, and 5 every other day.  He seems to be  tolerating his amiodarone without too much difficulty; and is now 40 mg  a day.  His Lovenox has been discontinued.   MEDICATIONS INCLUDE:  1. Coumadin daily.  2. Lanoxin 0.25 mg daily.  3. Vytorin 10/40 daily.  4. Amiodarone 400 mg daily.  5. Lasix 40 mg b.i.d.  6. Potassium chloride 20 mEq p.o. b.i.d.  7. Digoxin 0.25 mg daily.  8. Enalapril 2.5 b.i.d.   PHYSICAL EXAMINATION:  He is alert and oriented in no acute distress.  The blood pressure is 123/80 and pulse is 77.  The lung fields are actually quite clear.  Extremities reveal no edema.   ELECTROCARDIOGRAM:  Now demonstrates normal sinus rhythm with  nonspecific T-wave abnormalities.   IMPRESSION:  1. Recurrent paroxysmal atrial fibrillation, now back in normal sinus      rhythm.  2. Left ventricular dysfunction secondary to #1.  3. Hypercholesterolemia on lipid-lowering therapy.   PLAN:  1. Return to clinic in three to four weeks.  2. We will get a fasting lipid profile and basic metabolic profile      within the next several days.  3. He will return to see me.  4. His Coumadin has been appropriately adjusted with his low  INR.     Arturo Morton. Riley Kill, MD, [facility]  Electronically Signed    TDS/MedQ  DD: 03/29/2007  DT: 03/30/2007  Job #: [id number]

## 2011-02-16 NOTE — Assessment & Plan Note (Signed)
[facility] HEALTHCARE                            CARDIOLOGY OFFICE NOTE   [name], [name]                  MRN:          [id number]  DATE:07/27/2007                            DOB:          13-Oct-1932    [name] is in for a followup visit.  To briefly summarize, he was  in atrial fib.  He has been cardioverted.  He is now in sinus rhythm on  low-dose amiodarone.  He has had a mitral valve repair.  Repeat  echocardiography now demonstrates an ejection fraction of about 45% with  diffuse left ventricular wall hypokinesis.  There is a mitral valve  ring, which appears to be moving well, and there is only mild mitral  regurgitation.  There is moderate dilatation of the right atrium.  By  mitral pressure half-time, the valve area is estimated at 3.378.   He comes in today to review the echocardiogram results and we have done  this, in fact.   His laboratory studies reveal preserved renal function with normal  potassium.   CURRENT MEDICATIONS INCLUDE:  1. Coumadin.  2. Multivitamin.  3. Vytorin 10/40 daily.  4. Lasix 40 mg b.i.d.  5. Potassium chloride 10 mEq b.i.d.  6. Digoxin 0.25 mg daily.  7. Enalapril 2.5 mg b.i.d.  8. Amiodarone 200 mg daily.  9. Omeprazole 20 mg daily.   PHYSICAL:  He is alert and oriented, in no distress.  The blood pressure is 140/70, pulse is 88.  The lung fields are clear.  The cardiac rhythm is regular.  I do not appreciate significant mitral  regurgitation.  There is no extremity edema.   Overall, I believe he has improved.  His ejection fraction clearly has  improved.  His laboratory studies are stable.  We will see him back in  followup in two months.     Arturo Morton. Riley Kill, MD, [facility]  Electronically Signed    TDS/MedQ  DD: 07/27/2007  DT: 07/28/2007  Job #: [id number]

## 2011-02-16 NOTE — Assessment & Plan Note (Signed)
[facility] HEALTHCARE                            CARDIOLOGY OFFICE NOTE   [name]                  MRN:          [id number]  DATE:06/19/2008                            DOB:          1933-01-18    [name] is back in for followup.  He is really doing quite well.  He denies any ongoing chest pain, shortness of breath, or any other  problems.  He has had his laboratory studies done and his INR was 1.5,  hemoglobin 11.7.  They have increased his warfarin back to his old dose,  and I have told him I thought he would be safe now to go to 2 and 2.5.  He has turned in with some Hemoccults to his gastroenterologist.   PHYSICAL EXAMINATION:  VITAL SIGNS:  Today, the blood pressure is  120/70, the pulse is 80.  LUNGS:  Lung fields are clear.  CARDIAC:  Rhythm is regular.   Overall, [name] seems to be getting along extremely well.  I plan  to see him back in followup in about 4-6 weeks.  They will continue to  follow his pro time at [facility].  Should he have any problems in the  interim, he knows to contact us directly.  We appreciate Dr. Earnest Conroy  excellent care.     Arturo Morton. Riley Kill, MD, [facility]  Electronically Signed    TDS/MedQ  DD: 06/19/2008  DT: 06/20/2008  Job #: [id number]   cc:   Arta Silence, MD  Lynnae Prude
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
## 2011-02-16 NOTE — Assessment & Plan Note (Signed)
[facility]                            CARDIOLOGY OFFICE NOTE   [name], [name]                  MRN:          [id number]  DATE:03/13/2007                            DOB:          October 22, 1932    [name] is in for followup today.  It has actually been 2006 since  I saw him last.  Approximately 2 weeks ago this gentleman developed  shortness of breath associated with some dizziness, and he has had some  swelling of the ankles.  He has also been lightheaded, and has moderate  increase in weight gain.  The patient was switched on his Lanoxin from  one source to another, although I believe that the dosing remained the  same and he is currently taking 0.25 mg daily.  Mr. Moorer previously  had new onset of atrial fibrillation.  He had an echocardiogram that  demonstrated an EF in the range of about 50%. He subsequently underwent  cardioversion, and has continued to maintain sinus rhythm since I last  saw him in 2004.  He had been referred by Dr. Hetty Ely.  The patient had  had a prior mitral valve repair by Dr. Donata Clay in 1996.  The patient  subsequently underwent cardioversion, and has continued to stay in sinus  rhythm and has done well ever since that time.   CURRENT MEDICATIONS:  1. Coumadin 5 mg daily.  2. Multivitamin daily.  3. Lanoxin 0.25 mg daily.  4. Vytorin daily.  Apparently the Vytorin is new and we do not have      the dosing on this as well.  The patient was previously on Lipitor.   PHYSICAL EXAMINATION:  He is a fairly alert gentleman in no acute  distress.  The weight is 198 pounds.  The blood pressure 130/90 and the  pulse is 101.  LUNG FIELDS:  Relatively clear to auscultation and percussion.  CARDIAC:  Reveals an irregularly irregular rhythm with a soft apical  murmur.  EXTREMITIES:  There is trace edema in the lower extremities.   LABORATORY DATA:  Hemoglobin of 12.3, hematocrit of 37.  The INR is 2.0.  Importantly, the INR was previously 3.7 and most recently 2.7.  BUN is 4  and creatinine is 0.9.  TSH checked today was 0.82.   The EKG demonstrates atrial fibrillation with a moderately quick  ventricular response and delayed R-wave progression with nonspecific ST-  T wave abnormalities.   Mr. Docken appears to have developed recurrent atrial fibrillation.  At the present time, my feeling is that we should go ahead and repeat  his 2D echocardiogram.  We will tentatively plan to put him on Cardizem  CD 180 mg daily for better rate control, but this could be influenced by  the findings of the 2D echo.  We will make arrangements for this to be  done tomorrow and I will see him back in followup tomorrow in the clinic  so that we can reassess the clinical situation.     Arturo Morton. Riley Kill, MD, [facility]  Electronically Signed    TDS/MedQ  DD: 03/14/2007  DT: 03/14/2007  Job #: [id number]

## 2011-02-16 NOTE — Assessment & Plan Note (Signed)
[name]                            CARDIOLOGY OFFICE NOTE   [name], [name]                  MRN:          [id number]  DATE:10/29/2008                            DOB:          November 20, 1932    [name] is in for a followup visit.  To briefly summarize, he is  stable.  He recently had laser surgery on his eyes.  His INR did get  out, but he has had it rechecked over the last week and it was down to  2.1 according to the patient.  He is getting ready to potentially move  this to [facility] [place], because of its cost.  He has had his eyes  done in [place].  He denies cough, ongoing chest pain, proximal  muscle weakness, or any other significant abnormality.   MEDICATIONS:  1. Coumadin as directed.  2. Lasix 40 mg b.i.d.  3. Digoxin 0.25 mg daily.  4. Enalapril 2.5 b.i.d.  5. Amiodarone 200 mg daily.  6. Omeprazole 20 mEq daily.  7. Klor-Con 10 mEq b.i.d.  8. Flomax 0.4 daily.   PHYSICAL EXAMINATION:  GENERAL:  He is alert and oriented and in no  distress.  VITAL SIGNS:  Weight is 180.  The blood pressure is 130/70 and the pulse  is 85.  LUNGS:  Fields are clear.  CARDIAC:  There is not a significant mitral regurgitation murmur.  EXTREMITIES:  There is no extremity edema.   The electrocardiogram demonstrates normal sinus rhythm.  There is a  delay in R-wave progression and nonspecific T-wave abnormality.   IMPRESSION:  1. Status post gastrointestinal bleed following esophageal biopsy.  2. Status post cardioversion for recurrent paroxysmal atrial      fibrillation, now back in normal sinus rhythm on amiodarone.  3. History of left ventricular dysfunction with improved left      ventricular function with sinus rhythm.  4. Status post mitral valve repair in 1995.  5. History of mild coronary artery disease.   PLAN:  1. Continue current medical regimen.  2. I have suggested an echo and PFTs with DLCO, but he would like  to      put this off for several      months.  3. We will get a basic profile and a TSH.     [name]. Riley Kill, MD, [facility]  Electronically Signed    TDS/MedQ  DD: 10/29/2008  DT: 10/30/2008  Job #: [id number]

## 2011-02-16 NOTE — Discharge Summary (Signed)
NAMEGREGGORY, SAFRANEK           ACCOUNT NO.:  [id number]   MEDICAL RECORD NO.:  [id number]          PATIENT TYPE:  INP   LOCATION:  2020                         FACILITY:  [facility]   PHYSICIAN:  Doylene Canning. Ladona Ridgel, MD    DATE OF BIRTH:  05/11/1933   DATE OF ADMISSION:  03/15/2007  DATE OF DISCHARGE:                               DISCHARGE SUMMARY   THIS PATIENT HAS NO KNOWN DRUG ALLERGIES.   Dictation greater than 35 minutes with exam and explanations.   FINAL DIAGNOSES:  1. Recurrent atrial fibrillation causing volume overload.  2. Echocardiogram March 14, 2007, ejection fraction 10-15%.   SECONDARY DIAGNOSES:  1. Recurrent atrial fibrillation volume overload with aggressive      diuresis.  2. Weight loss, 11 pounds, over a 48 hour period here at [facility].  3. Started amiodarone load at 400 mg t.i.d.  4. Cardioversion March 17, 2007 with conversion from atrial      fibrillation, controlled ventricular rate to sinus rhythm.  Patient      is maintaining sinus rhythm at discharge.  5. History of mitral annuloplasty in 1996.  6. History of atrial fibrillation/cardioversion 3 years ago with a      rebound in ejection fraction to 50%.  7. History of peptic ulcer disease with gastrointestinal bleed x3,      status post partial gastrectomy.  8. Status post cholecystectomy.  9. Status post appendectomy.  10.New York Heart Association Class II-III failure congestive failure      symptoms with atrial fibrillation.   PROCEDURE:  March 17, 2007, DC cardioversion, Dr. Ladona Ridgel.  Atrial  fibrillation is sinus rhythm.   BRIEF HISTORY:  Mr. Rash is a 77 year old male.  He has a history of  mitral valve annuloplasty done in 1996.  He has symptomatic atrial  fibrillation.  He had successful cardioversion about 3 years ago.  He  had been Coumadin, which was therapeutic May, April and June.   He started being short of breath, dizzy with lower extremity edema about  2 weeks ago.  He went from  183 to 205 pounds.  He had no chest pain, no  syncope and no palpitations.  He presented to Dr. Rosalyn Charters office, June  9.  A 2D echocardiogram showed his ejection fraction had declined to 10-  15%.  His ejection fraction had been 50% after cardioversion 3 years  ago.  He was found at office visit, June 9, to be in recurrent atrial  fibrillation.  Plans were made to admit him to [facility] for  aggressive diuresis and also to load amiodarone and subsequent  cardioversion to sinus rhythm.   HOSPITAL COURSE:  Patient admitted June 11.  He underwent immediate  loading with amiodarone and was placed on IV Lasix during this  hospitalization.  He has lost 11 pounds and lower extremity edema has  markedly improved.  His breathing, as well, has improved with lung  sounds relatively clear at the time of discharge.  He had successful  cardioversion June 13.  His INR was subtherapeutic at 1.9 on discharge  and he goes home with Lovenox  bridge.  He also has been added enalapril  for cardiomyopathy 2.5 mg twice daily.   DISCHARGE MEDICATIONS:  1. Amiodarone 200 mg tablets, 2 tabs in the morning and 2 tabs in the      evening for 7 days, then to take 2 in the morning daily starting      June 20.  This is a new medication.  2. Lovenox 80 mg subcutaneous injection twice daily for 5 days, this      is a new medication.  3. Lasix 40 mg twice daily, new.  4. Potassium chloride 20 mEq twice daily, new.  5. Digoxin 0.25 mg daily.  6. Enalapril 2.5 mg twice daily, new.  7. Vytorin 10/40 at bedtime.  8. Protonix 40 mg daily, optional to fill if patient is feeling as if      he is having more GERD.  9. Multivitamin daily.  10.Coumadin.  He will go home on his home dose 5 mg daily, except for      2.5 mg Tuesday and Thursday, or as directed by Dr. Hetty Ely in his      office.  He follows up at Dr. Lorenza Chick office Monday, June 16, at      2:10 p.m. and he will see Dr. Riley Kill Wednesday, June 25,  at 3:45.      Eventually, in 6-8 weeks the patient will have a redo      echocardiogram, hopefully in sinus rhythm to check for rebound in      ejection fraction.  Coumadin dosing sheet has been sent to Dr.      Lorenza Chick office with indication that the patient is on amiodarone      and may need a decrease in home dose.   At the time of discharge, the patient's pro time 22.5, INR is 1.5.  Serum electrolytes, on June 13, sodium 137, potassium 3.9, chloride 100,  carbonate 32, BUN is 9, creatinine 0.91, glucose 113.  Complete blood  count, this admission, hemoglobin 12.6, hematocrit 39.2, white cells is  5.9, platelets 262.  TSH, this admission, 0.848.  The BNP, on admission,  was 296.  D-dimer 1.28.   Patient is also counseled to weigh daily and if he has weight increases  of 2-3 pounds over a 48 hour period to call [phone].      Maple Mirza, PA      Doylene Canning. Ladona Ridgel, MD  Electronically Signed    GM/MEDQ  D:  03/17/2007  T:  03/18/2007  Job:  [id number]   cc:   Arta Silence, MD  Arturo Morton. Riley Kill, MD, [facility]

## 2011-02-16 NOTE — Assessment & Plan Note (Signed)
[facility]                            CARDIOLOGY OFFICE NOTE   SNYDER, COLAVITO                  MRN:          [id number]  DATE:07/06/2007                            DOB:          1933-01-31    Mr. Thorton is in for followup.  He is actually feeling really pretty  well.  He denies any chest pain or significant shortness of breath.  His  cardiac rhythm has continued to remain regular.   RECENT LABORATORY STUDIES:  Include liver function studies.  He was  supposed to have a TSH, although I cannot find that this was done.  His  liver panel now reveals an SGPT of 44 and an SGOT of 41.  The later of  which is only 4 over the upper normal range.  With regard to his  pulmonary function studies, his FEV1 to FVC is 87%.  His diffusion  capacity is 111.   CURRENT MEDICATIONS:  1. Coumadin.  2. Multivitamin.  3. Vytorin 10/40 daily.  4. Lasix 40 mg b.i.d.  5. Potassium chloride 10 mEq b.i.d.  6. Digoxin 0.25 mg daily.  7. Enalapril 2.5 mg b.i.d.  8. Amiodarone 200 mg daily.  9. Omeprazole 20 mg daily.   PHYSICAL EXAMINATION:  VITAL SIGNS:  The blood pressure is 120/69.  The  pulse is 69.  LUNGS:  Fields actually are quite clear.  CARDIAC:  Unchanged.  EXTREMITIES:  There is no lower extremity edema.   The electrocardiogram demonstrates normal sinus rhythm.  There are  diffuse nonspecific T wave flattening with a QTC of 417 milliseconds.   PLAN:  1. We will have him return in 2 weeks in followup.  2. A 2D echocardiogram will be obtained.  3. If the EF is not improved, we will have him see electrophysiology      for further discussions of options.     Arturo Morton. Riley Kill, MD, [facility]  Electronically Signed    TDS/MedQ  DD: 07/06/2007  DT: 07/06/2007  Job #: [id number]

## 2011-02-16 NOTE — Assessment & Plan Note (Signed)
[facility]                            CARDIOLOGY OFFICE NOTE   BURHANUDDIN, KOHLMANN                  MRN:          [id number]  DATE:05/08/2008                            DOB:          11/22/1932    Mr. Creamer is in for followup.  From a clinical standpoint, he is  stable.  He underwent biopsy of the esophagus by Dr. Jarold Motto.  He was  on Coumadin at that time.  Before he got home, he bled multiple units,  and was admitted to [facility] for 5 days.  They had to do an  emergency procedure to get the bleeding stopped.  He is subsequently  stabilized, is doing well, and he denies any ongoing chest pain.   He has not resumed his Coumadin quite at this point.   Prior to the procedure, the patient did have a low MCV, and modest  anemia with hemoglobin just above 11.   Today, the blood pressure is 128/81, pulse is 87, weight is 173 pounds.  The lung fields are clear and the cardiac rhythm is regular.   His EKG reveals normal sinus rhythm with delay in R-wave progression  with leftward oriented axis.   Overall, Mr. Ullman is fortunately stabilized.  It does not sound like  he is having active bleeding.  We will want to restart his Coumadin  anticoagulation, but it sounds like he has been mostly in sinus rhythm,  so at this present time I probably will go out weeks before we initiate  this.  In the interim, he will remain on amiodarone, which he had been  on chronically.  We will get a TSH, liver function studies.  If he has  any problem in the interim, he is to call us.  We will also check a CBC.     Arturo Morton. Riley Kill, MD, [facility]  Electronically Signed    TDS/MedQ  DD: 05/08/2008  DT: 05/09/2008  Job #: [id number]   cc:   Vania Rea. Jarold Motto, MD, [facility], [facility]

## 2011-02-16 NOTE — Assessment & Plan Note (Signed)
[facility]                            CARDIOLOGY OFFICE NOTE   TERRAL, COOKS                  MRN:          [id number]  DATE:04/27/2007                            DOB:          1933/02/19    Mr. Garron is in for followup.  In general, he is stable.  He has not  been having any chest pain or progressive shortness of breath.  The  patient was in atrial fibrillation, and was successfully cardioverted.  He has had perhaps a little bit of dizziness, but no syncope or  presyncope.  His followup echocardiogram revealed an ejection fraction  of 30% and this has clearly improved from the 10% to 15% that he had.  It is not as good as what he had a couple of years ago, which was 50%.  His blood pressure today was well-controlled over at Dr. Lorenza Chick  office; when he came in here, it was slightly higher.   PHYSICAL EXAMINATION:  On physical today, the blood pressure is 146/70  with pulse of 78.  The lung fields actually are quite clear.  The cardiac rhythm is regular.  The extremities reveal no edema.   IMPRESSION:  1. Status post cardioversion for recurrent paroxysmal atrial      fibrillation, now back in normal rhythm, on amiodarone and Coumadin      anticoagulation.  2. Left ventricular dysfunction, improved from the last visit, but not      back to baseline.  3. Status post mitral valve repair, 1995.  4. History of gastrointestinal bleed.  5. History of mild coronary disease.  6. History of negative stools.   PLAN:  1. Decrease amiodarone to 200 mg daily.  2. Pulmonary function tests.  3. Get chest x-ray report.  4. Return to clinic in 2 months.  5. Repeat echo in 3 months.  6. Liver function tests and TSH.     Arturo Morton. Riley Kill, MD, [facility]  Electronically Signed    TDS/MedQ  DD: 04/27/2007  DT: 04/28/2007  Job #: [id number]

## 2011-02-16 NOTE — Assessment & Plan Note (Signed)
[facility]                            CARDIOLOGY OFFICE NOTE   ATTILA, MCCARTHY                  MRN:          [id number]  DATE:07/29/2008                            DOB:          1933/07/19    Mr. Mane is in for followup.  He is stable.  His INR is now 2.0.  He  feels well.  He denies any chest pain or progressive shortness of  breath.  The patient is continued to do relatively well without too much  difficulty.   CURRENT MEDICATIONS:  1. Coumadin as directed.  2. Lasix 40 mg b.i.d.  3. Digoxin 0.25 mg daily.  4. Enalapril 2.5 mg p.o. b.i.d.  5. Amiodarone 200 mg daily.  6. Omeprazole 20 mg daily.  7. Klor-Con 10 mEq b.i.d.   PHYSICAL EXAMINATION:  GENERAL:  He is alert and oriented, in no  distress.  VITAL SIGNS:  Blood pressure 130/76 and pulse 76.  It is regular.  LUNGS:  Fields are clear.  CARDIAC:  Rhythm is regular.   Overall, this gentleman is stable.  He denies any ongoing chest pain.  He has had a chest x-ray within the last year that is unremarkable.  He  has had a lot of laboratory studies, most recently with follow up with  Dr. Hetty Ely in August of this year.  He had a TSH, which was at 0.37.  His liver function studies were normal.  We will see him back in  followup in 3 months.  At that time, I will likely repeat a TSH.  He is  also having his hemoglobins checked in [place] and the stools have  been negative.   He will return to see [place] in 3 months' time.     Arturo Morton. Riley Kill, MD, [facility]  Electronically Signed    TDS/MedQ  DD: 07/29/2008  DT: 07/30/2008  Job #: [id number]   cc:   Arta Silence, MD

## 2011-02-16 NOTE — Assessment & Plan Note (Signed)
[facility]                            CARDIOLOGY OFFICE NOTE   [name], [name]                  MRN:          [id number]  DATE:01/31/2008                            DOB:          1932/12/29    [name] is in for follow-up.  In general, he is doing quite well.  We last saw him about 2 months ago.  He feels better overall in general.  His last estimated ejection fraction in 2008 was 45% after restoration  of sinus rhythm.   CURRENT MEDICATIONS:  1. Coumadin daily.  2. Multivitamin daily.  3. Lasix 40 mg p.o. b.i.d.  4. Digoxin 0.25 mg daily.  5. Enalapril 2.5 mg p.o. b.i.d.  6. Pacerone 200 mg daily.  7. Omeprazole 20 mg daily.  8. Klor-Con 10 mEq b.i.d.   PHYSICAL EXAMINATION:  GENERAL:  He is alert and oriented.  VITAL SIGNS:  The weight is 181 pounds which is stable.  Blood pressure  is 136/68, pulse of 66.  LUNGS:  The lung fields are clear to auscultation and percussion.   LABORATORY DATA:  His most recent laboratory studies included a  hemoglobin of 12.8, hematocrit of 39.  BUN and creatinine were normal.  TSH was 05.1 at that time.   Overall, the patient appears to be stable.  The patient had pulmonary  function studies done in August 2008, with a DLCO of 111%.  We will  continue to follow him closely.  He will need hepatic function studies,  as well as basic metabolic profile, TSH and pulmonary studies on a  regular basis.  Of note, the patient also had a low-grade headache, for  which we performed a CT scan which showed no acute abnormality.     Arturo Morton. Riley Kill, MD, [facility]  Electronically Signed    TDS/MedQ  DD: 02/01/2008  DT: 02/01/2008  Job #: [id number]

## 2011-02-16 NOTE — Op Note (Signed)
NAMERALF, KONOPKA           ACCOUNT NO.:  [id number]   MEDICAL RECORD NO.:  [id number]          PATIENT TYPE:  INP   LOCATION:  2020                         FACILITY:  [facility]   PHYSICIAN:  Doylene Canning. Ladona Ridgel, MD    DATE OF BIRTH:  November 07, 1932   DATE OF PROCEDURE:  DATE OF DISCHARGE:                               OPERATIVE REPORT   PROCEDURE PERFORMED:  DC cardioversion.   INDICATION:  Symptomatic atrial fibrillation   The patient is a 77 year old male with a history of mitral valve repair  (angioplasty 1996) and a history of atrial fibrillation who underwent DC  cardioversion several years ago, restoring sinus rhythm.  He has been on  Coumadin.  He was admitted to the hospital after developing increasing  shortness of breath and recurrent atrial fibrillation and a 2-D echo  demonstrated severe LV dysfunctional EF of 15%.  Previously, his EF had  been 50%.  His heart failure is class 2-3.  He now underwent diuresis  with IV Lasix and now referred for DC cardioversion.   PROCEDURE:  After informed consent was obtained, the patient was prepped  in the usual manner.  The electrode dispersing pad was placed in the  anterior-posterior position.  The patient was sedated with 115 mg of  sodium Pentothal by Dr. Krista Blue with the anesthesia service.  Two  intervals of synchronized biphasic energy was delivered x2 through the  electrode dispersing patches during sinus rhythm.  The patient tolerated  the procedure well.  He was allowed to awaken in stable condition.   COMPLICATIONS:  There were no history of complications.   RESULTS:  This demonstrates successful DC cardioversion in a patient  with symptomatic atrial fibrillation.      Doylene Canning. Ladona Ridgel, MD  Electronically Signed     GWT/MEDQ  D:  03/17/2007  T:  03/17/2007  Job:  [id number]   cc:   Arturo Morton. Riley Kill, MD, [facility]  Arta Silence, MD

## 2011-02-19 NOTE — Assessment & Plan Note (Signed)
[facility]                         GASTROENTEROLOGY OFFICE NOTE   [name]                  MRN:          [id number]  DATE:11/02/2006                            DOB:          17-Sep-1933    The patient comes in on January 30. He is a patient of Dr. Molly Maduro  Schaller's for recall colon. He says he has heartburn and indigestion at  times, some abdominal bloating, and gas and constipation alternating  with diarrhea. He has had this for quite some time. Sometimes he has  noticed some black stools. Does have a history of ulcers in the past.  Last colonoscopic examination was in 2002.   PAST MEDICAL HISTORY:  Reveals he has a cardiac arrhythmia,  hypercholesterolemia, sleep apnea. He has had a history of stomach  ulcers in the past in 2002 and is status post cholecystectomy and heart  valve surgery.   SOCIAL HISTORY:  Reveals that he drinks 6 to 8 beers probably in a  weeks' time usually at a football or similar activity such as this. Has  continued to work.   REVIEW OF SYSTEMS:  Is non-contributory except for some increased  fatigue.   PHYSICAL EXAMINATION:  He is 5 feet, 11 inches. Weight is 194. Blood  pressure 130/80. Pulse is 85 and irregular.  OROPHARYNX: Was negative.  NECK, HEART and EXTREMITIES:  All unremarkable.  CHEST: Was clear.  Inguinal area was negative.  Supraclavicular was negative.   IMPRESSION:  1. Arteriosclerotic cardiovascular disease, status post atrial      fibrillation.  2. History of ___________, status post peptic ulcer disease.  3. Mitral valve replacement.  4. Routine colonoscopic examination.   RECOMMENDATIONS:  Discuss his anticoagulation with Dr. Riley Kill and we  could either do the procedure on or off this medication. His last  procedure was in 2002 and did not reveal any significant pathology  except for some minimal diverticular changes as well as small  internal/external hemorrhoids. We will  schedule him for his procedure at  his convenience and will handle his anticoagulations as per his  cardiologist, Dr. Riley Kill.    Jeremy Mort, MD  Electronically Signed   SML/MedQ  DD: 11/02/2006  DT: 11/02/2006  Job #: [id number]   cc:   Arturo Morton. Riley Kill, MD, [facility]

## 2011-02-22 ENCOUNTER — Other Ambulatory Visit: Payer: Self-pay | Admitting: Family Medicine

## 2011-03-31 ENCOUNTER — Encounter: Payer: Self-pay | Admitting: Cardiology

## 2011-03-31 ENCOUNTER — Ambulatory Visit (INDEPENDENT_AMBULATORY_CARE_PROVIDER_SITE_OTHER): Payer: Medicare Other | Admitting: Cardiology

## 2011-03-31 DIAGNOSIS — I10 Essential (primary) hypertension: Secondary | ICD-10-CM

## 2011-03-31 DIAGNOSIS — I5020 Unspecified systolic (congestive) heart failure: Secondary | ICD-10-CM

## 2011-03-31 DIAGNOSIS — I4891 Unspecified atrial fibrillation: Secondary | ICD-10-CM

## 2011-03-31 NOTE — Patient Instructions (Signed)
Your physician has requested that you have an echocardiogram in 3 months (same day as office visit). Echocardiography is a painless test that uses sound waves to create images of your heart. It provides your doctor with information about the size and shape of your heart and how well your heart's chambers and valves are working. This procedure takes approximately one hour. There are no restrictions for this procedure.  Your physician recommends that you schedule a follow-up appointment in: 3 months.

## 2011-04-08 NOTE — Assessment & Plan Note (Signed)
Currently remains under control.

## 2011-04-08 NOTE — Progress Notes (Signed)
HPI:  Doing well.  Not SOB.  I discussed need for him to have follow up echoes, but he is understandably worried about the costs.  He has lost a lot of money in a couple of Ponzi scheme things.   Denies other issues at present.  Energy level remains good.   Current Outpatient Prescriptions  Medication Sig Dispense Refill  . Coenzyme Q10 (COQ10) 100 MG CAPS Take by mouth daily.        . cyanocobalamin 100 MCG tablet Take 100 mcg by mouth daily.        . digoxin (LANOXIN) 0.25 MG tablet Take 250 mcg by mouth daily.        . enalapril (VASOTEC) 5 MG tablet Take 1 tablet (5 mg total) by mouth 2 (two) times daily.  60 tablet  11  . finasteride (PROSCAR) 5 MG tablet Take 1 tablet (5 mg total) by mouth daily.  30 tablet  5  . furosemide (LASIX) 40 MG tablet Take 40 mg by mouth daily.       . Multiple Vitamin (MULTIVITAMIN) tablet Take 1 tablet by mouth daily.        Marland Kitchen omeprazole (PRILOSEC) 20 MG capsule Take 20 mg by mouth daily.        . potassium chloride (KLOR-CON 10) 10 MEQ CR tablet Take 10 mEq by mouth daily.       . Red Yeast Rice 600 MG CAPS Take by mouth daily.        . Tamsulosin HCl (FLOMAX) 0.4 MG CAPS Take 0.4 mg by mouth daily.        Marland Kitchen warfarin (COUMADIN) 5 MG tablet TAKE ONE TABLET BY MOUTH EVERY DAY  30 tablet  6    Allergies  Allergen Reactions  . Atorvastatin     REACTION: unspecified    Past Medical History  Diagnosis Date  . Coronary atherosclerosis of unspecified type of vessel, native or graft 2008    cardioversion. Echo mod LVH EF 25%, Triv AR  Mild LAE, RAE 11/15/*11  . Encounter for long-term (current) use of anticoagulants   . Atrial fibrillation   . Pure hypercholesterolemia   . Esophageal reflux   . Sprain of neck   . Dizziness and giddiness   . Iron deficiency anemia, unspecified   . Unspecified sleep apnea   . Left heart failure   . Personal history of other diseases of digestive system   . Diaphragmatic hernia without mention of obstruction or gangrene     . Unspecified hemorrhoids without mention of complication   . Diverticulosis of colon (without mention of hemorrhage)   . Encounter for long-term (current) use of other medications   . Encounter for therapeutic drug monitoring   . Personal history of peptic ulcer disease   . Gastritis 12/18/08    EGD, 3 clips remaining (Dr. Mechele Collin)  . Upper GI bleed 7/15-7/20/09    Hosp.  Elevated INR, coumadin stopped sinusitis    Past Surgical History  Procedure Date  . Mitral valve repair 1995  . Cholecystectomy   . Partial gastrectomy   . Appendectomy   . Cataract extraction 10/13/08    left  . Mitral valve annuloplasty 1996    Family History  Problem Relation Age of Onset  . Cancer Father     lung    History   Social History  . Marital Status: Married    Spouse Name: N/A    Number of Children: 3  . Years of Education:  N/A   Occupational History  . heating and air     retired   Social History Main Topics  . Smoking status: Never Smoker   . Smokeless tobacco: Not on file  . Alcohol Use: Yes     socially, beer   . Drug Use: No  . Sexually Active: Not on file   Other Topics Concern  . Not on file   Social History Narrative   Daily caffeine use.    ROS: Please see the HPI.  All other systems reviewed and negative.  PHYSICAL EXAM:  BP 120/68  Pulse 82  Ht 5\' 11"  (1.803 m)  Wt 188 lb (85.276 kg)  BMI 26.22 kg/m2  General: Well developed, well nourished, in no acute distress. Head:  Normocephalic and atraumatic. Neck: no JVD Lungs: Clear to auscultation and percussion. Heart: Normal S1 and S2.  No murmur, rubs or gallops.  Abdomen:  Normal bowel sounds; soft; non tender; no organomegaly Pulses: Pulses normal in all 4 extremities. Extremities: No clubbing or cyanosis. No edema. Neurologic: Alert and oriented x 3.  EKG:  NSR.  Nonspecific T abnormality.  QTc is normal  (411 msec). ASSESSMENT AND PLAN:

## 2011-04-08 NOTE — Assessment & Plan Note (Signed)
Maintaining NSR at present.  EF falls when he goes out of rhythm, and not always obvious.  We need to continue to follow him closely over time.

## 2011-04-26 LAB — PROTIME-INR

## 2011-04-28 ENCOUNTER — Encounter: Payer: Self-pay | Admitting: Family Medicine

## 2011-04-29 ENCOUNTER — Other Ambulatory Visit: Payer: Self-pay | Admitting: Family Medicine

## 2011-04-29 DIAGNOSIS — I10 Essential (primary) hypertension: Secondary | ICD-10-CM

## 2011-05-05 ENCOUNTER — Other Ambulatory Visit (INDEPENDENT_AMBULATORY_CARE_PROVIDER_SITE_OTHER): Payer: Medicare Other | Admitting: Family Medicine

## 2011-05-05 DIAGNOSIS — I10 Essential (primary) hypertension: Secondary | ICD-10-CM

## 2011-05-05 LAB — BASIC METABOLIC PANEL
CO2: 30 mEq/L (ref 19–32)
Chloride: 99 mEq/L (ref 96–112)
Creatinine, Ser: 0.8 mg/dL (ref 0.4–1.5)

## 2011-05-12 ENCOUNTER — Encounter: Payer: Self-pay | Admitting: Family Medicine

## 2011-05-12 ENCOUNTER — Ambulatory Visit (INDEPENDENT_AMBULATORY_CARE_PROVIDER_SITE_OTHER): Payer: Medicare Other | Admitting: Family Medicine

## 2011-05-12 VITALS — BP 140/78 | HR 80 | Temp 97.1°F | Wt 192.0 lb

## 2011-05-12 DIAGNOSIS — L719 Rosacea, unspecified: Secondary | ICD-10-CM

## 2011-05-12 DIAGNOSIS — Z23 Encounter for immunization: Secondary | ICD-10-CM

## 2011-05-12 DIAGNOSIS — I10 Essential (primary) hypertension: Secondary | ICD-10-CM

## 2011-05-12 MED ORDER — METRONIDAZOLE 1 % EX GEL: Freq: Every day | CUTANEOUS | Status: DC

## 2011-05-12 NOTE — Assessment & Plan Note (Signed)
Cont Metrogel. Script sent in.

## 2011-05-12 NOTE — Progress Notes (Signed)
  Subjective:    Patient ID: [name], male    DOB: [birth year], 77 y.o.   MRN: [id number]  HPI Pt here for three month followup after increasing Enalapril. His BP when at Dr Rosalyn Charters recently was great.  He needs Metrogrel. It is expensive but helps his Rosacea so he would like it refilled. He also has postnasal drip at night and would like help. His wife also suffers this. She has been successful at losing weight recently.    Review of Systems  Constitutional: Negative for fever, chills, diaphoresis, activity change, appetite change and fatigue.  HENT: Negative for hearing loss, ear pain, congestion, sore throat, rhinorrhea, neck pain, neck stiffness, postnasal drip, sinus pressure, tinnitus and ear discharge.   Eyes: Negative for pain, discharge and visual disturbance.  Respiratory: Negative for cough, shortness of breath and wheezing.   Cardiovascular: Negative for chest pain and palpitations.       No SOB w/ exertion  Gastrointestinal:       No heartburn or swallowing problems.  Genitourinary:       No nocturia  Skin:       No itching or dryness.  Neurological:       No tingling or balance problems.  All other systems reviewed and are negative.       Objective:   Physical Exam  Constitutional: He appears well-developed and well-nourished. No distress.  HENT:  Head: Normocephalic and atraumatic.  Right Ear: External ear normal.  Left Ear: External ear normal.  Nose: Nose normal.  Mouth/Throat: Oropharynx is clear and moist.       Mild nasal congestion.  Eyes: Conjunctivae and EOM are normal. Pupils are equal, round, and reactive to light. Right eye exhibits no discharge. Left eye exhibits no discharge.  Neck: Normal range of motion. Neck supple.  Cardiovascular: Normal rate and regular rhythm.   Pulmonary/Chest: Effort normal and breath sounds normal. He has no wheezes.  Lymphadenopathy:    He has no cervical adenopathy.  Skin: He is not diaphoretic.           Assessment & Plan:

## 2011-05-12 NOTE — Progress Notes (Signed)
  Subjective:    Patient ID: [name], male    DOB: [birth year], 77 y.o.   MRN: [id number]  HPI    Review of Systems     Objective:   Physical Exam        Assessment & Plan:  Discussed Zostavax. Pharmacy will request script if he requests. Pneumovax today.

## 2011-05-12 NOTE — Patient Instructions (Signed)
Try Zyrtec 10mg  at night. Take Guaifenesin (400mg ), take 11/2 tabs by mouth AM and NOON. Get GUAIFENESIN by  going to [facility] or [facility] and getting MUCOUS RELIEF EXPECTORANT/CONGESTION. DO NOT GET MUCINEX (Timed Release Guaifenesin)  Drink lots of fluids.

## 2011-05-12 NOTE — Assessment & Plan Note (Addendum)
Improved but still on the high side. Avoid salt. Lose weight. Cont increased dose of Enalopril, BMET ok.

## 2011-05-31 ENCOUNTER — Telehealth: Payer: Self-pay | Admitting: *Deleted

## 2011-05-31 NOTE — Telephone Encounter (Signed)
Patient's wife called and requested that a prescription for the shingles vaccine be sent to [facility].

## 2011-06-02 MED ORDER — ZOSTER VACCINE LIVE 19400 UNT/0.65ML ~~LOC~~ SOLR: 0.6500 mL | Freq: Once | SUBCUTANEOUS | Status: DC

## 2011-06-02 NOTE — Telephone Encounter (Signed)
Please verify medication to be dispense by pharmacy.

## 2011-06-02 NOTE — Telephone Encounter (Signed)
Ok as ordered

## 2011-06-02 NOTE — Telephone Encounter (Signed)
Patient notified that rx has been sent to the pharmacy. 

## 2011-06-28 ENCOUNTER — Other Ambulatory Visit (HOSPITAL_COMMUNITY): Payer: Medicare Other | Admitting: Radiology

## 2011-06-28 ENCOUNTER — Ambulatory Visit: Payer: Medicare Other | Admitting: Cardiology

## 2011-06-29 ENCOUNTER — Encounter: Payer: Self-pay | Admitting: Family Medicine

## 2011-07-04 ENCOUNTER — Other Ambulatory Visit: Payer: Self-pay | Admitting: Cardiology

## 2011-07-06 ENCOUNTER — Telehealth: Payer: Self-pay | Admitting: Cardiology

## 2011-07-06 NOTE — Telephone Encounter (Signed)
Pt is out of omepaprazole 20mg  and needs a 90day supply called into [facility] in [place] on [address] phone# is [phone]

## 2011-07-08 MED ORDER — OMEPRAZOLE 20 MG PO CPDR: 20.0000 mg | DELAYED_RELEASE_CAPSULE | Freq: Every day | ORAL | Status: DC

## 2011-07-12 IMAGING — US US CAROTID DUPLEX BILAT
1 series · 17 of 24 positions shown · non-contrast
Comparison: none

REASON FOR EXAM: tia
COMMENTS:

[Series 1: us carotid duplex bilat · 17 of 61 slices shown]
[im 1/61]
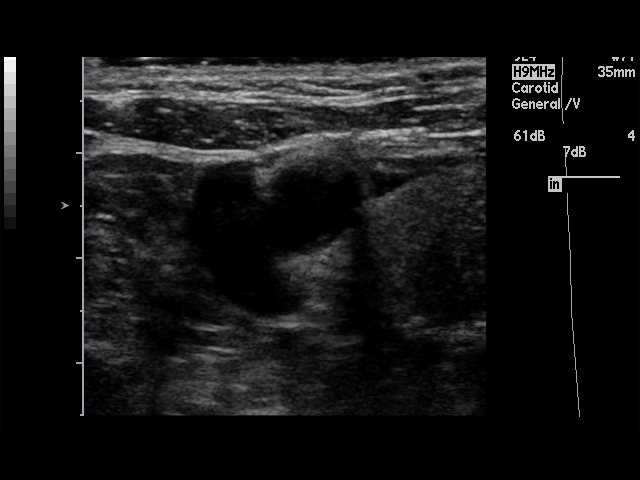
[im 6/61]
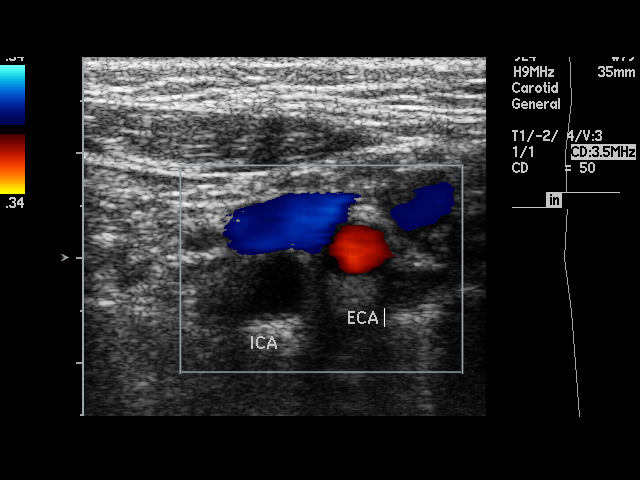
[im 8/61]
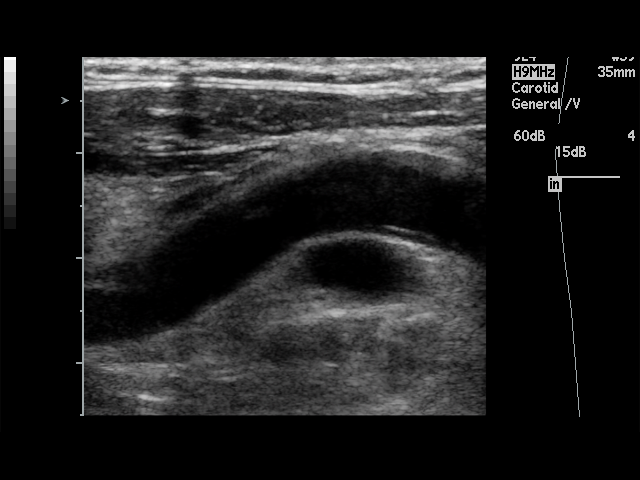
[im 11/61]
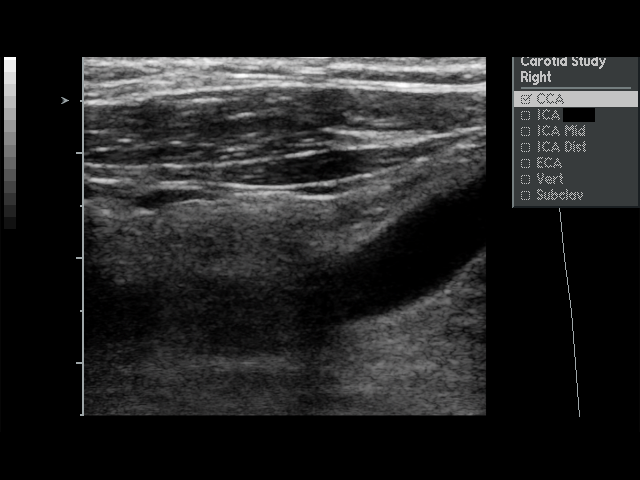
[im 16/61]
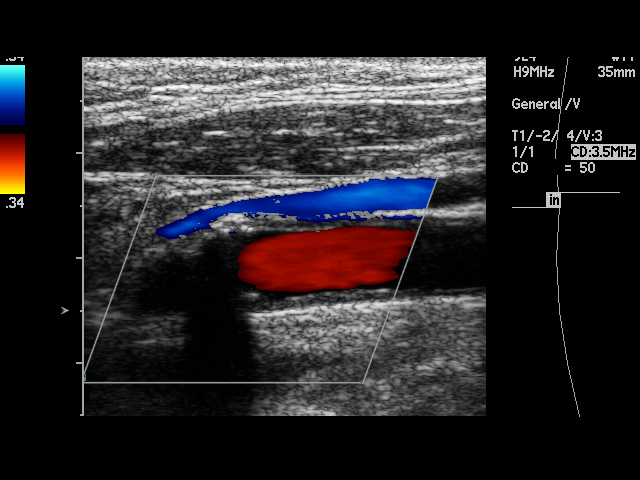
[im 19/61]
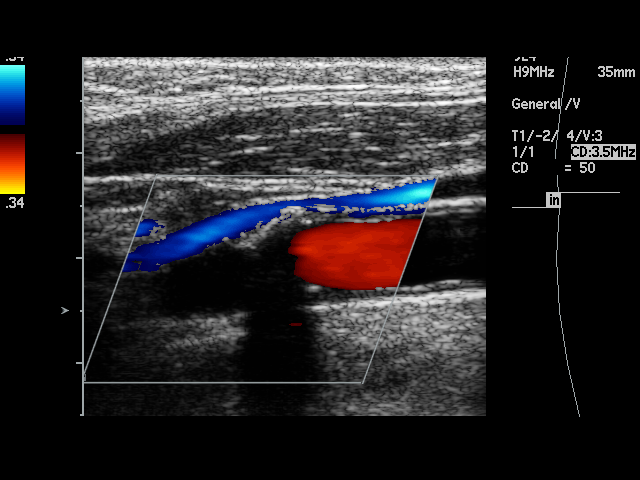
[im 24/61]
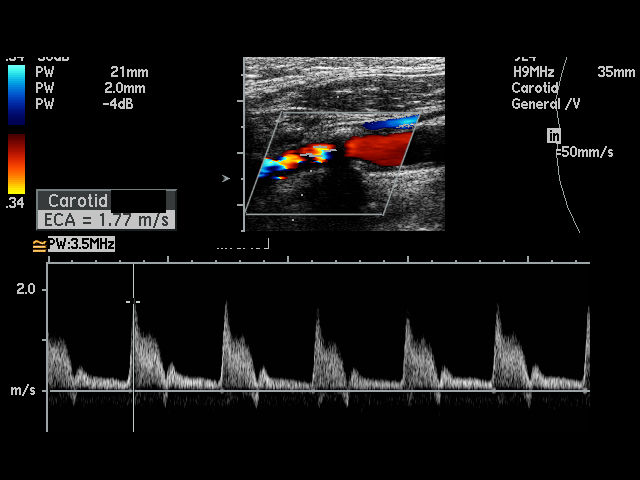
[im 27/61]
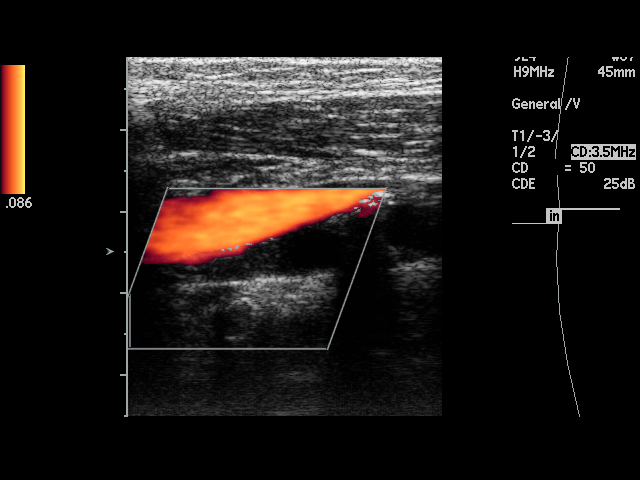
[im 32/61]
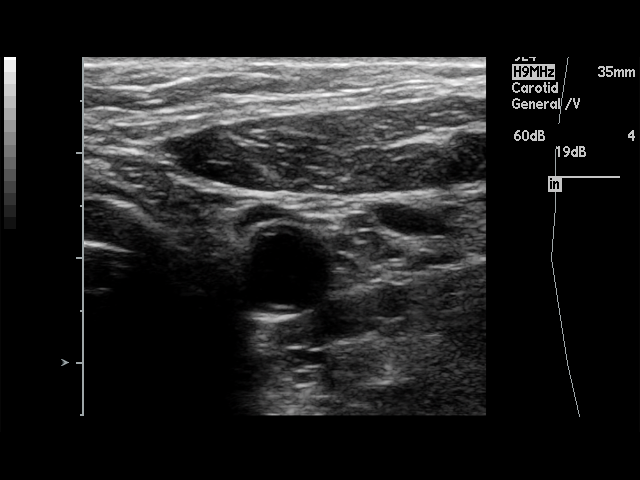
[im 34/61]
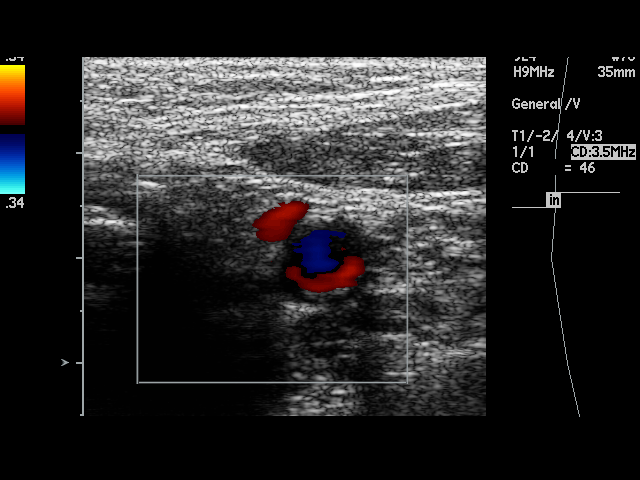
[im 37/61]
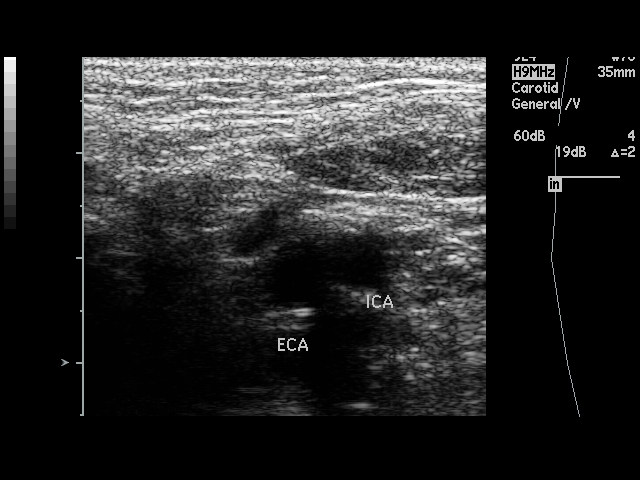
[im 42/61]
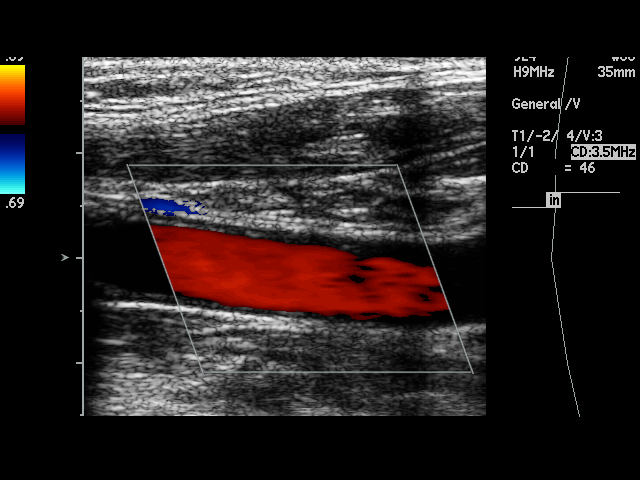
[im 45/61]
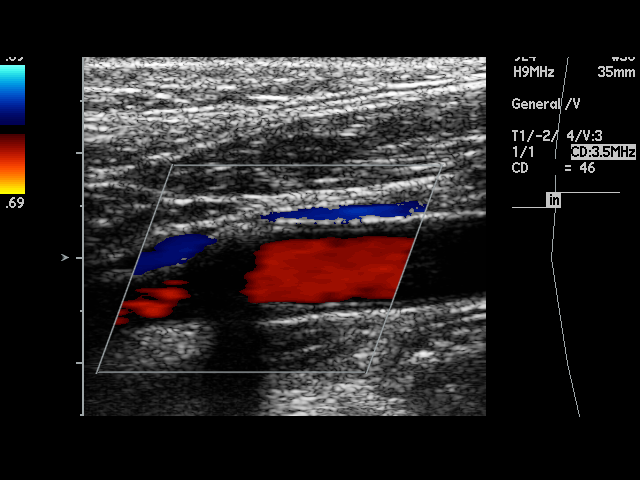
[im 50/61]
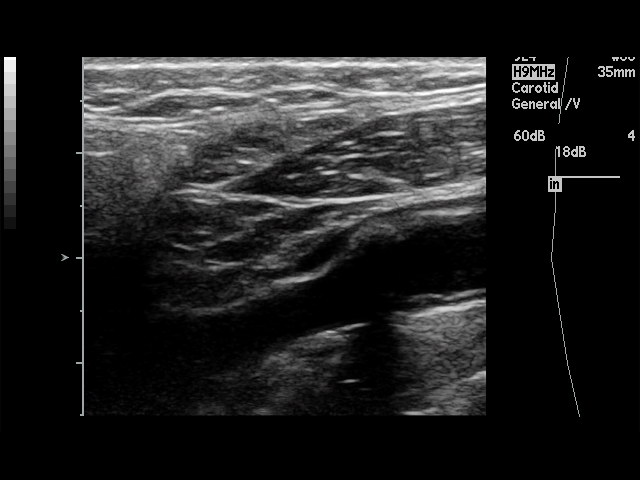
[im 53/61]
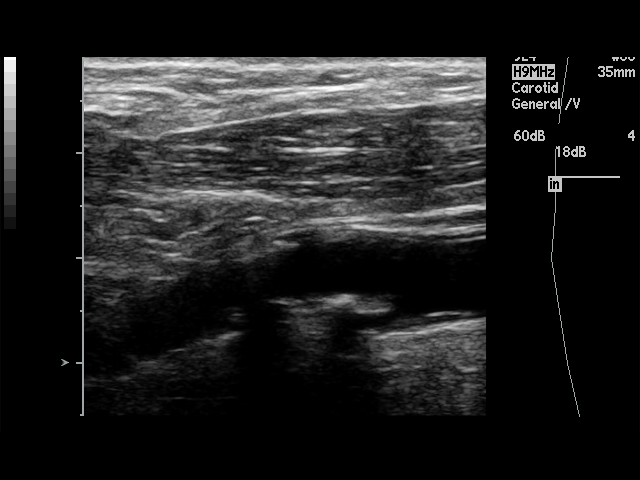
[im 55/61]
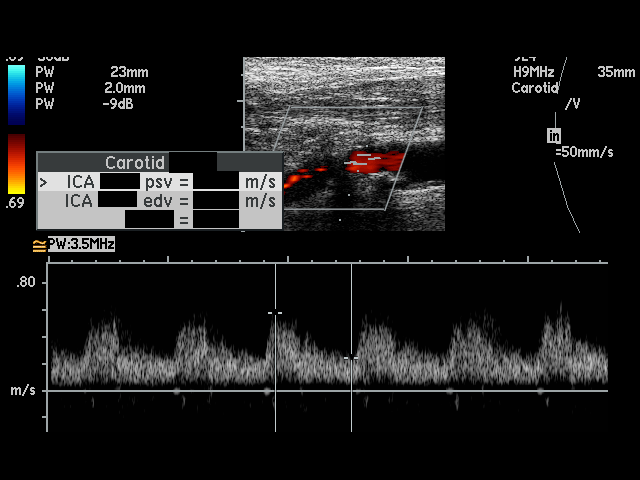
[im 61/61]
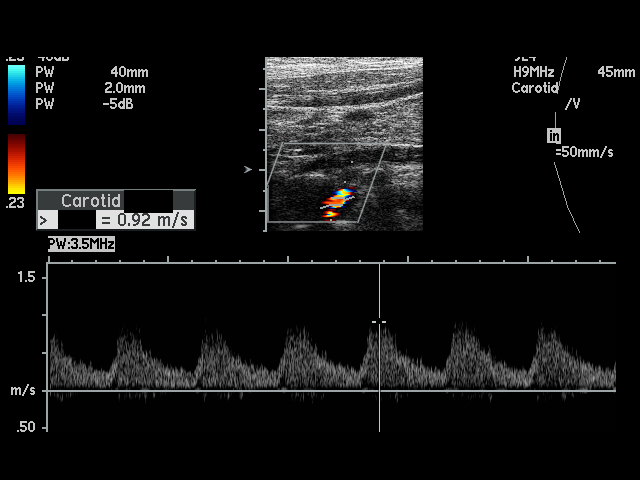

[17 of 24 positions shown; findings below may reference images not displayed]

PROCEDURE:     US  - US CAROTID DOPPLER BILATERAL  - September 05, 2009  [DATE]

RESULT:

On the right, there is complete occlusion of the proximal right internal
carotid. A small amount of mixed, smooth and calcific plaque formation is
noted in the right common carotid artery and at the carotid bulb.

On the left, there is a small to moderate amount of mixed, smooth and
calcific plaque formation about the carotid bifurcation and in the left
internal carotid. On the left, the peak left common carotid artery flow
velocity measures 0.96 meters per second and the peak left internal carotid
artery flow velocity measures 1.52 meters per second. The ICA/CCA ratio on
the left is 1.583. These values are consistent with the absence of
hemodynamically significant stenosis on the left side.

There is observed antegrade flow in both vertebrals.
IMPRESSION: 1.  There is complete occlusion of the proximal right internal carotid area.
2.  There is a small amount of mixed smooth and calcific plaque formation at
the carotid bifurcation on the left.
3.  No hemodynamically significant stenosis is observed on the left.
4.  There is antegrade flow in both vertebrals.

## 2011-07-21 ENCOUNTER — Telehealth: Payer: Self-pay

## 2011-07-21 NOTE — Telephone Encounter (Signed)
Pt rtn call pls call [phone]

## 2011-07-21 NOTE — Telephone Encounter (Signed)
I spoke with the pt and he is feeling well.  The pt said that he can tell when he goes into AFIB due to symptoms and he has not had any symptoms. The pt will call the office ASAP if he goes into AFIB.  The pt will have Echo and OV in January 2013.

## 2011-07-21 NOTE — Telephone Encounter (Signed)
The pt cancelled his appointment for Echo and OV with Dr Riley Kill on 06/28/11.  Lela attempted to reschedule these appointments but the pt would like to wait until January of 2013.  I made Dr Riley Kill aware and he would like me to make sure the pt is doing okay.  He also wanted to make the pt aware that if he is out of rhythm that can lead to his EF decreasing quickly.  I left a message for the pt to call back.

## 2011-07-22 LAB — BASIC METABOLIC PANEL
BUN: 6
BUN: 6
CO2: 31
CO2: 32
Chloride: 101
Chloride: 101
GFR calc non Af Amer: >60
Glucose, Bld: 109 — ABNORMAL HIGH
Glucose, Bld: 113 — ABNORMAL HIGH
Glucose, Bld: 84
Potassium: 3.9
Potassium: 4
Potassium: 4.3
Sodium: 137

## 2011-07-22 LAB — CBC
HCT: 39.2
MCV: 80
Platelets: 262
RDW: 15.5 — ABNORMAL HIGH

## 2011-07-23 ENCOUNTER — Encounter: Payer: Self-pay | Admitting: Family Medicine

## 2011-08-19 ENCOUNTER — Encounter: Payer: Self-pay | Admitting: Family Medicine

## 2011-09-14 ENCOUNTER — Other Ambulatory Visit: Payer: Self-pay | Admitting: Family Medicine

## 2011-09-14 ENCOUNTER — Other Ambulatory Visit: Payer: Self-pay | Admitting: Cardiology

## 2011-09-14 NOTE — Telephone Encounter (Signed)
Patient needs to make a future appointment.

## 2011-09-21 ENCOUNTER — Encounter: Payer: Self-pay | Admitting: Family Medicine

## 2011-09-25 ENCOUNTER — Other Ambulatory Visit: Payer: Self-pay | Admitting: Family Medicine

## 2011-10-08 ENCOUNTER — Other Ambulatory Visit: Payer: Self-pay | Admitting: Cardiology

## 2011-10-27 ENCOUNTER — Encounter: Payer: Self-pay | Admitting: Family Medicine

## 2011-11-22 ENCOUNTER — Ambulatory Visit (HOSPITAL_COMMUNITY): Payer: Medicare Other | Attending: Cardiology | Admitting: Radiology

## 2011-11-22 ENCOUNTER — Encounter: Payer: Self-pay | Admitting: Cardiology

## 2011-11-22 ENCOUNTER — Ambulatory Visit: Payer: Medicare Other | Admitting: Cardiology

## 2011-11-22 ENCOUNTER — Other Ambulatory Visit: Payer: Self-pay

## 2011-11-22 ENCOUNTER — Ambulatory Visit (INDEPENDENT_AMBULATORY_CARE_PROVIDER_SITE_OTHER): Payer: Medicare Other | Admitting: Cardiology

## 2011-11-22 VITALS — BP 141/91 | HR 94 | Ht 71.0 in | Wt 187.0 lb

## 2011-11-22 DIAGNOSIS — I4891 Unspecified atrial fibrillation: Secondary | ICD-10-CM

## 2011-11-22 DIAGNOSIS — I10 Essential (primary) hypertension: Secondary | ICD-10-CM | POA: Insufficient documentation

## 2011-11-22 DIAGNOSIS — I079 Rheumatic tricuspid valve disease, unspecified: Secondary | ICD-10-CM | POA: Insufficient documentation

## 2011-11-22 DIAGNOSIS — I319 Disease of pericardium, unspecified: Secondary | ICD-10-CM | POA: Insufficient documentation

## 2011-11-22 DIAGNOSIS — I2510 Atherosclerotic heart disease of native coronary artery without angina pectoris: Secondary | ICD-10-CM | POA: Insufficient documentation

## 2011-11-22 DIAGNOSIS — I080 Rheumatic disorders of both mitral and aortic valves: Secondary | ICD-10-CM | POA: Insufficient documentation

## 2011-11-22 LAB — PROTIME-INR

## 2011-11-22 NOTE — Patient Instructions (Signed)
Your physician recommends that you schedule a follow-up appointment in: 1-2 WEEKS---- December 08, 2011 arrive at 12:30  Your physician recommends that you continue on your current medications as directed. Please refer to the Current Medication list given to you today.  You have been referred to Electrophysiology.

## 2011-11-22 NOTE — Assessment & Plan Note (Signed)
hgb one year ago was normal again.

## 2011-11-22 NOTE — Progress Notes (Signed)
HPI:  Overall he is doing well.  No chest pain.  He is out of rhythm but did not know.  However, remains stable.  He continues to remain worried about his finances.    Current Outpatient Prescriptions  Medication Sig Dispense Refill  . Coenzyme Q10 (COQ10) 100 MG CAPS Take by mouth daily.        . cyanocobalamin 100 MCG tablet Take 100 mcg by mouth daily.        . digoxin (LANOXIN) 0.25 MG tablet TAKE ONE TABLET BY MOUTH EVERY DAY  90 tablet  0  . enalapril (VASOTEC) 5 MG tablet Take 1 tablet (5 mg total) by mouth 2 (two) times daily.  60 tablet  11  . finasteride (PROSCAR) 5 MG tablet Take 1 tablet (5 mg total) by mouth daily.  30 tablet  5  . furosemide (LASIX) 40 MG tablet Take 40 mg by mouth daily.       . metroNIDAZOLE (METROGEL) 1 % gel Apply topically daily.  45 g  1  . Multiple Vitamin (MULTIVITAMIN) tablet Take 1 tablet by mouth daily.        Marland Kitchen omeprazole (PRILOSEC) 20 MG capsule Take 1 capsule (20 mg total) by mouth daily.  90 capsule  3  . potassium chloride (KLOR-CON 10) 10 MEQ CR tablet Take 10 mEq by mouth daily.       . Red Yeast Rice 600 MG CAPS Take by mouth 2 (two) times daily.       . Tamsulosin HCl (FLOMAX) 0.4 MG CAPS TAKE ONE CAPSULE BY MOUTH EVERY DAY  30 capsule  3  . warfarin (COUMADIN) 5 MG tablet TAKE ONE TABLET BY MOUTH EVERY DAY  30 tablet  3    Allergies  Allergen Reactions  . Atorvastatin     REACTION: unspecified    Past Medical History  Diagnosis Date  . Coronary atherosclerosis of unspecified type of vessel, native or graft 2008    cardioversion. Echo mod LVH EF 25%, Triv AR  Mild LAE, RAE 11/15/*11  . Encounter for long-term (current) use of anticoagulants   . Atrial fibrillation   . Pure hypercholesterolemia   . Esophageal reflux   . Sprain of neck   . Dizziness and giddiness   . Iron deficiency anemia, unspecified   . Unspecified sleep apnea   . Left heart failure   . Personal history of other diseases of digestive system   .  Diaphragmatic hernia without mention of obstruction or gangrene   . Unspecified hemorrhoids without mention of complication   . Diverticulosis of colon (without mention of hemorrhage)   . Encounter for long-term (current) use of other medications   . Encounter for therapeutic drug monitoring   . Personal history of peptic ulcer disease   . Gastritis 12/18/08    EGD, 3 clips remaining (Dr. Mechele Collin)  . Upper GI bleed 7/15-7/20/09    Hosp.  Elevated INR, coumadin stopped sinusitis    Past Surgical History  Procedure Date  . Mitral valve repair 1995  . Cholecystectomy   . Partial gastrectomy   . Appendectomy   . Cataract extraction 10/13/08    left  . Mitral valve annuloplasty 1996    Family History  Problem Relation Age of Onset  . Cancer Father     lung    History   Social History  . Marital Status: Married    Spouse Name: N/A    Number of Children: 3  . Years of  Education: N/A   Occupational History  . heating and air     retired   Social History Main Topics  . Smoking status: Never Smoker   . Smokeless tobacco: Never Used  . Alcohol Use: Yes     socially, beer   . Drug Use: No  . Sexually Active: Not on file   Other Topics Concern  . Not on file   Social History Narrative   Daily caffeine use.    ROS: Please see the HPI.  All other systems reviewed and negative.  PHYSICAL EXAM:  BP 141/91  Pulse 94  Ht 5\' 11"  (1.803 m)  Wt 187 lb (84.823 kg)  BMI 26.08 kg/m2  General: Well developed, well nourished, in no acute distress. Head:  Normocephalic and atraumatic. Neck: no JVD.  Carefully checked Lungs: Clear to auscultation and percussion. Heart: Irregularly irregular.  No murmur, rubs or gallops.  Abdomen:  Normal bowel sounds; soft; non tender; no organomegaly Pulses: Pulses normal in all 4 extremities. Extremities: No clubbing or cyanosis. No edema. Neurologic: Alert and oriented x 3.  EKG:  Low voltage QRS with irregular rhythm.  No acute  changes.  ASSESSMENT AND PLAN:

## 2011-11-22 NOTE — Assessment & Plan Note (Signed)
Stable.  Overall LV down, but may be best to continue to observe.  Will discuss with patient issue of ICD if appropriate.

## 2011-11-22 NOTE — Assessment & Plan Note (Signed)
Has gone back in to atrial fib.  His functional status is unchanged.  Awaiting echo report, but I have reviewed and it looks about the same.  I discussed seeing an EP for consideration of ICD possibly and he is not too keen on the idea.  Will call patient after we get formal report and see back again in a week or two.

## 2011-11-22 NOTE — Assessment & Plan Note (Signed)
Minimally elevated

## 2011-12-08 ENCOUNTER — Ambulatory Visit: Payer: Medicare Other | Admitting: Cardiology

## 2011-12-09 ENCOUNTER — Ambulatory Visit (INDEPENDENT_AMBULATORY_CARE_PROVIDER_SITE_OTHER): Payer: Medicare Other | Admitting: Cardiology

## 2011-12-09 ENCOUNTER — Encounter: Payer: Self-pay | Admitting: Cardiology

## 2011-12-09 VITALS — BP 130/74 | HR 100 | Ht 71.0 in | Wt 189.4 lb

## 2011-12-09 DIAGNOSIS — I10 Essential (primary) hypertension: Secondary | ICD-10-CM

## 2011-12-09 DIAGNOSIS — I4891 Unspecified atrial fibrillation: Secondary | ICD-10-CM

## 2011-12-09 DIAGNOSIS — I428 Other cardiomyopathies: Secondary | ICD-10-CM

## 2011-12-09 DIAGNOSIS — I420 Dilated cardiomyopathy: Secondary | ICD-10-CM

## 2011-12-09 NOTE — Patient Instructions (Signed)
Your physician has recommended that you have a Cardioversion (DCCV). Electrical Cardioversion uses a jolt of electricity to your heart either through paddles or wired patches attached to your chest. This is a controlled, usually prescheduled, procedure. Defibrillation is done under light anesthesia in the hospital, and you usually go home the day of the procedure. This is done to get your heart back into a normal rhythm. You are not awake for the procedure. Please see the instruction sheet given to you today.  Your physician recommends that you continue on your current medications as directed. Please refer to the Current Medication list given to you today. 

## 2011-12-09 NOTE — Progress Notes (Signed)
HPI:  He feels well and does not know he is out of rhythm.  He is stable however.  He denies any chest pain.    Current Outpatient Prescriptions  Medication Sig Dispense Refill  . Coenzyme Q10 (COQ10) 100 MG CAPS Take by mouth daily.        . cyanocobalamin 100 MCG tablet Take 100 mcg by mouth daily.        . digoxin (LANOXIN) 0.25 MG tablet TAKE ONE TABLET BY MOUTH EVERY DAY  90 tablet  0  . enalapril (VASOTEC) 5 MG tablet Take 1 tablet (5 mg total) by mouth 2 (two) times daily.  60 tablet  11  . finasteride (PROSCAR) 5 MG tablet Take 1 tablet (5 mg total) by mouth daily.  30 tablet  5  . furosemide (LASIX) 40 MG tablet Take 40 mg by mouth daily.       . metroNIDAZOLE (METROGEL) 1 % gel Apply topically daily.  45 g  1  . Multiple Vitamin (MULTIVITAMIN) tablet Take 1 tablet by mouth daily.        Marland Kitchen omeprazole (PRILOSEC) 20 MG capsule Take 1 capsule (20 mg total) by mouth daily.  90 capsule  3  . potassium chloride (KLOR-CON 10) 10 MEQ CR tablet Take 10 mEq by mouth daily.       . Red Yeast Rice 600 MG CAPS Take by mouth 2 (two) times daily.       . Tamsulosin HCl (FLOMAX) 0.4 MG CAPS TAKE ONE CAPSULE BY MOUTH EVERY DAY  30 capsule  3  . warfarin (COUMADIN) 5 MG tablet TAKE ONE TABLET BY MOUTH EVERY DAY  30 tablet  3    Allergies  Allergen Reactions  . Atorvastatin     REACTION: unspecified    Past Medical History  Diagnosis Date  . Coronary atherosclerosis of unspecified type of vessel, native or graft 2008    cardioversion. Echo mod LVH EF 25%, Triv AR  Mild LAE, RAE 11/15/*11  . Encounter for long-term (current) use of anticoagulants   . Atrial fibrillation   . Pure hypercholesterolemia   . Esophageal reflux   . Sprain of neck   . Dizziness and giddiness   . Iron deficiency anemia, unspecified   . Unspecified sleep apnea   . Left heart failure   . Personal history of other diseases of digestive system   . Diaphragmatic hernia without mention of obstruction or gangrene     . Unspecified hemorrhoids without mention of complication   . Diverticulosis of colon (without mention of hemorrhage)   . Encounter for long-term (current) use of other medications   . Encounter for therapeutic drug monitoring   . Personal history of peptic ulcer disease   . Gastritis 12/18/08    EGD, 3 clips remaining (Dr. Mechele Collin)  . Upper GI bleed 7/15-7/20/09    Hosp.  Elevated INR, coumadin stopped sinusitis    Past Surgical History  Procedure Date  . Mitral valve repair 1995  . Cholecystectomy   . Partial gastrectomy   . Appendectomy   . Cataract extraction 10/13/08    left  . Mitral valve annuloplasty 1996    Family History  Problem Relation Age of Onset  . Cancer Father     lung    History   Social History  . Marital Status: Married    Spouse Name: N/A    Number of Children: 3  . Years of Education: N/A   Occupational History  .  heating and air     retired   Social History Main Topics  . Smoking status: Never Smoker   . Smokeless tobacco: Never Used  . Alcohol Use: Yes     socially, beer   . Drug Use: No  . Sexually Active: Not on file   Other Topics Concern  . Not on file   Social History Narrative   Daily caffeine use.    ROS: Please see the HPI.  All other systems reviewed and negative.  PHYSICAL EXAM:  BP 130/74  Pulse 100  Ht 5\' 11"  (1.803 m)  Wt 189 lb 6.4 oz (85.911 kg)  BMI 26.42 kg/m2  General: Well developed, well nourished, in no acute distress. Head:  Normocephalic and atraumatic. Neck: no JVD Lungs: Clear to auscultation and percussion. Heart: irregularly, irregular without murmur.  Abdomen:  Normal bowel sounds; soft; non tender; no organomegaly Pulses: Pulses normal in all 4 extremities. Extremities: No clubbing or cyanosis. No edema. Neurologic: Alert and oriented x 3.  EKG:  Atrial fibrillation with CVR.  Delay in  R wave progression.  ECHO (11/22/11)   Study Conclusions  - Left ventricle: The cavity size was  normal. Wall thickness was increased in a pattern of mild LVH. Systolic function was severely reduced. The estimated ejection fraction was in the range of 20% to 25%. Diffuse hypokinesis. Doppler parameters are consistent with restrictive physiology, indicative of decreased left ventricular diastolic compliance and/or increased left atrial pressure. - Aortic valve: Trivial regurgitation. - Mitral valve: Annuloplasry ring noted, Well seated. Calcification, with mild involvement of chords. There was no evidence for stenosis. Mild regurgitation. - Left atrium: The atrium was moderately dilated. - Tricuspid valve: Moderate regurgitation. - Pulmonary arteries: PA peak pressure: 37mm Hg (S). - Pericardium, extracardiac: A small pericardial effusion was identified.    ASSESSMENT AND PLAN:

## 2011-12-10 ENCOUNTER — Encounter (HOSPITAL_COMMUNITY): Payer: Self-pay | Admitting: Pharmacy Technician

## 2011-12-15 ENCOUNTER — Encounter: Payer: Self-pay | Admitting: Cardiology

## 2011-12-15 ENCOUNTER — Other Ambulatory Visit: Payer: Self-pay | Admitting: Cardiology

## 2011-12-17 ENCOUNTER — Other Ambulatory Visit: Payer: Self-pay

## 2011-12-17 ENCOUNTER — Encounter (HOSPITAL_COMMUNITY): Payer: Self-pay | Admitting: Anesthesiology

## 2011-12-17 ENCOUNTER — Encounter (HOSPITAL_COMMUNITY)
Admission: RE | Disposition: A | Discharge status: Home / Self Care | Payer: Self-pay | Source: Ambulatory Visit | Attending: Cardiology

## 2011-12-17 ENCOUNTER — Ambulatory Visit (HOSPITAL_COMMUNITY): Payer: Medicare Other | Admitting: Anesthesiology

## 2011-12-17 ENCOUNTER — Ambulatory Visit (HOSPITAL_COMMUNITY)
Admission: RE | Admit: 2011-12-17 | Discharge: 2011-12-17 | Disposition: A | Discharge status: Home / Self Care | Payer: Medicare Other | Source: Ambulatory Visit | Attending: Cardiology | Admitting: Cardiology

## 2011-12-17 DIAGNOSIS — K219 Gastro-esophageal reflux disease without esophagitis: Secondary | ICD-10-CM | POA: Insufficient documentation

## 2011-12-17 DIAGNOSIS — I509 Heart failure, unspecified: Secondary | ICD-10-CM | POA: Insufficient documentation

## 2011-12-17 DIAGNOSIS — I2510 Atherosclerotic heart disease of native coronary artery without angina pectoris: Secondary | ICD-10-CM | POA: Insufficient documentation

## 2011-12-17 DIAGNOSIS — I4891 Unspecified atrial fibrillation: Secondary | ICD-10-CM

## 2011-12-17 DIAGNOSIS — I10 Essential (primary) hypertension: Secondary | ICD-10-CM | POA: Insufficient documentation

## 2011-12-17 DIAGNOSIS — E785 Hyperlipidemia, unspecified: Secondary | ICD-10-CM | POA: Insufficient documentation

## 2011-12-17 DIAGNOSIS — K449 Diaphragmatic hernia without obstruction or gangrene: Secondary | ICD-10-CM | POA: Insufficient documentation

## 2011-12-17 DIAGNOSIS — G4730 Sleep apnea, unspecified: Secondary | ICD-10-CM | POA: Insufficient documentation

## 2011-12-17 HISTORY — PX: CARDIOVERSION: SHX1299

## 2011-12-17 SURGERY — CARDIOVERSION
Anesthesia: Monitor Anesthesia Care | Wound class: Clean

## 2011-12-17 MED ORDER — PROPOFOL 10 MG/ML IV BOLUS
INTRAVENOUS | Status: DC | PRN
  Administered 2011-12-17: 70 mg via INTRAVENOUS

## 2011-12-17 MED ORDER — SODIUM CHLORIDE 0.9 % IJ SOLN: 3.0000 mL | Freq: Two times a day (BID) | INTRAMUSCULAR | Status: DC

## 2011-12-17 MED ORDER — SODIUM CHLORIDE 0.9 % IJ SOLN: 3.0000 mL | INTRAMUSCULAR | Status: DC | PRN

## 2011-12-17 MED ORDER — SODIUM CHLORIDE 0.9 % IV SOLN: INTRAVENOUS | Status: DC

## 2011-12-17 MED ORDER — HYDROCORTISONE 1 % EX CREA: 1.0000 "application " | TOPICAL_CREAM | Freq: Three times a day (TID) | CUTANEOUS | Status: DC | PRN

## 2011-12-17 MED ORDER — SODIUM CHLORIDE 0.9 % IV SOLN: 250.0000 mL | INTRAVENOUS | Status: DC

## 2011-12-17 NOTE — Transfer of Care (Signed)
Immediate Anesthesia Transfer of Care Note  Patient: [name]  Procedure(s) Performed: Procedure(s) (LRB): CARDIOVERSION (N/A)  Patient Location: Short Stay  Anesthesia Type: MAC  Level of Consciousness: awake, alert , oriented and patient cooperative  Airway & Oxygen Therapy: Patient Spontanous Breathing and Patient connected to nasal cannula oxygen  Post-op Assessment: Report given to PACU RN and Post -op Vital signs reviewed and stable  Post vital signs: Reviewed and stable  Complications: No apparent anesthesia complications

## 2011-12-17 NOTE — Preoperative (Signed)
Beta Blockers   Reason not to administer Beta Blockers:Not Applicable 

## 2011-12-17 NOTE — Discharge Instructions (Signed)
Electrical Cardioversion Cardioversion is the delivery of a jolt of electricity to change the rhythm of the heart. Sticky patches or metal paddles are placed on the chest to deliver the electricity from a special device. This is done to restore a normal rhythm. A rhythm that is too fast or not regular keeps the heart from pumping well. Compared to medicines used to change an abnormal rhythm, cardioversion is faster and works better. It is also unpleasant and may dislodge blood clots from the heart. WHEN WOULD THIS BE DONE?  In an emergency:   There is low or no blood pressure as a result of the heart rhythm.   Normal rhythm must be restored as fast as possible to protect the brain and heart from further damage.   It may save a life.   For less serious heart rhythms, such as atrial fibrillation or flutter, in which:   The heart is beating too fast or is not regular.   The heart is still able to pump enough blood, but not as well as it should.   Medicine to change the rhythm has not worked.   It is safe to wait in order to allow time for preparation.  LET YOUR CAREGIVER KNOW ABOUT:   Every medicine you are taking. It is very important to do this! Know when to take or stop taking any of them.   Any time in the past that you have felt your heart was not beating normally.  RISKS AND COMPLICATIONS   Clots may form in the chambers of the heart if it is beating too fast. These clots may be dislodged during the procedure and travel to other parts of the body.   There is risk of a stroke during and after the procedure if a clot moves. Blood thinners lower this risk.   You may have a special test of your heart (TEE) to make sure there are no clots in your heart.  BEFORE THE PROCEDURE   You may have some tests to see how well your heart is working.   You may start taking blood thinners so your blood does not clot as easily.   Other drugs may be given to help your heart work better.   PROCEDURE (SCHEDULED)  The procedure is typically done in a hospital by a heart doctor (cardiologist).   You will be told when and where to go.   You may be given some medicine through an intravenous (IV) access to reduce discomfort and make you sleepy before the procedure.   Your whole body may move when the shock is delivered. Your chest may feel sore.   You may be able to go home after a few hours. Your heart rhythm will be watched to make sure it does not change.  HOME CARE INSTRUCTIONS   Only take medicine as directed by your caregiver. Be sure you understand how and when to take your medicine.   Learn how to feel your pulse and check it often.   Limit your activity for 48 hours.   Avoid caffeine and other stimulants as directed.  SEEK MEDICAL CARE IF:   You feel like your heart is beating too fast or your pulse is not regular.   You have any questions about your medicines.   You have bleeding that will not stop.  SEEK IMMEDIATE MEDICAL CARE IF:   You are dizzy or feel faint.   It is hard to breathe or you feel short of breath.     There is a change in discomfort in your chest.   Your speech is slurred or you have trouble moving your arm or leg on one side.   You get a muscle cramp.   Your fingers or toes turn cold or blue.  MAKE SURE YOU:   Understand these instructions.   Will watch your condition.   Will get help right away if you are not doing well or get worse.  Document Released: 09/10/2002 Document Revised: 09/09/2011 Document Reviewed: 01/10/2008 Exit[employer] Patient Information 2012 ExitCare, LLCElectrical Cardioversion Cardioversion is the delivery of a jolt of electricity to change the rhythm of the heart. Sticky patches or metal paddles are placed on the chest to deliver the electricity from a special device. This is done to restore a normal rhythm. A rhythm that is too fast or not regular keeps the heart from pumping well. Compared to medicines used  to change an abnormal rhythm, cardioversion is faster and works better. It is also unpleasant and may dislodge blood clots from the heart. WHEN WOULD THIS BE DONE?  In an emergency:   There is low or no blood pressure as a result of the heart rhythm.   Normal rhythm must be restored as fast as possible to protect the brain and heart from further damage.   It may save a life.   For less serious heart rhythms, such as atrial fibrillation or flutter, in which:   The heart is beating too fast or is not regular.   The heart is still able to pump enough blood, but not as well as it should.   Medicine to change the rhythm has not worked.   It is safe to wait in order to allow time for preparation.  LET YOUR CAREGIVER KNOW ABOUT:   Every medicine you are taking. It is very important to do this! Know when to take or stop taking any of them.   Any time in the past that you have felt your heart was not beating normally.  RISKS AND COMPLICATIONS   Clots may form in the chambers of the heart if it is beating too fast. These clots may be dislodged during the procedure and travel to other parts of the body.   There is risk of a stroke during and after the procedure if a clot moves. Blood thinners lower this risk.   You may have a special test of your heart (TEE) to make sure there are no clots in your heart.  BEFORE THE PROCEDURE   You may have some tests to see how well your heart is working.   You may start taking blood thinners so your blood does not clot as easily.   Other drugs may be given to help your heart work better.  PROCEDURE (SCHEDULED)  The procedure is typically done in a hospital by a heart doctor (cardiologist).   You will be told when and where to go.   You may be given some medicine through an intravenous (IV) access to reduce discomfort and make you sleepy before the procedure.   Your whole body may move when the shock is delivered. Your chest may feel sore.    You may be able to go home after a few hours. Your heart rhythm will be watched to make sure it does not change.  HOME CARE INSTRUCTIONS   Only take medicine as directed by your caregiver. Be sure you understand how and when to take your medicine.   Learn how to feel  your pulse and check it often.   Limit your activity for 48 hours.   Avoid caffeine and other stimulants as directed.  SEEK MEDICAL CARE IF:   You feel like your heart is beating too fast or your pulse is not regular.   You have any questions about your medicines.   You have bleeding that will not stop.  SEEK IMMEDIATE MEDICAL CARE IF:   You are dizzy or feel faint.   It is hard to breathe or you feel short of breath.   There is a change in discomfort in your chest.   Your speech is slurred or you have trouble moving your arm or leg on one side.   You get a muscle cramp.   Your fingers or toes turn cold or blue.  MAKE SURE YOU:   Understand these instructions.   Will watch your condition.   Will get help right away if you are not doing well or get worse.  Document Released: 09/10/2002 Document Revised: 09/09/2011 Document Reviewed: 01/10/2008 [facility] Patient Information 2012 [place], [place].Marland Kitchen

## 2011-12-17 NOTE — Anesthesia Preprocedure Evaluation (Addendum)
Anesthesia Evaluation  Patient identified by MRN, date of birth, ID band Patient awake    Reviewed: Allergy & Precautions, H&P , NPO status , Patient's Chart, lab work & pertinent test results  History of Anesthesia Complications Negative for: history of anesthetic complications  Airway Mallampati: II TM Distance: >3 FB Neck ROM: Full    Dental  (+) Partial Upper, Partial Lower and Dental Advisory Given   Pulmonary sleep apnea and Continuous Positive Airway Pressure Ventilation ,  breath sounds clear to auscultation  Pulmonary exam normal       Cardiovascular hypertension, Pt. on medications + CAD and +CHF + dysrhythmias Atrial Fibrillation Rhythm:Regular Rate:Normal  EF 20-25%   Neuro/Psych  Neuromuscular disease negative psych ROS   GI/Hepatic Neg liver ROS, PUD, GERD-  Medicated and Controlled,Hiatal Hernia   Endo/Other  Hyperlipidemia  Renal/GU negative Renal ROS   BPH negative genitourinary   Musculoskeletal negative musculoskeletal ROS (+)   Abdominal Normal abdominal exam  (+)   Peds negative pediatric ROS (+)  Hematology negative hematology ROS (+)   Anesthesia Other Findings   Reproductive/Obstetrics                         Anesthesia Physical Anesthesia Plan  ASA: III  Anesthesia Plan: MAC   Post-op Pain Management:    Induction: Intravenous  Airway Management Planned: Natural Airway  Additional Equipment:   Intra-op Plan:   Post-operative Plan:   Informed Consent: I have reviewed the patients History and Physical, chart, labs and discussed the procedure including the risks, benefits and alternatives for the proposed anesthesia with the patient or authorized representative who has indicated his/her understanding and acceptance.   Dental advisory given  Plan Discussed with: CRNA, Anesthesiologist and Surgeon  Anesthesia Plan Comments:         Anesthesia Quick  Evaluation

## 2011-12-17 NOTE — Anesthesia Procedure Notes (Signed)
Procedure Name: MAC Date/Time: 12/17/2011 2:17 PM Performed by: Leona Singleton A Pre-anesthesia Checklist: Patient identified Patient Re-evaluated:Patient Re-evaluated prior to inductionOxygen Delivery Method: Ambu bag Preoxygenation: Pre-oxygenation with 100% oxygen Intubation Type: IV induction Ventilation: Mask ventilation without difficulty

## 2011-12-17 NOTE — CV Procedure (Signed)
The patient provided informed consent.  He was prepped and labs reviewed.  Anesthesia was provided by the anesthesia service, and he was given 70mg  of propofol.  150 w/s of synchronized version was given with immediate restoration of NSR.  Post anesthesia the patient was awake and this was without complication.    Plan:  EP evaluation for further options--AA therapy, and then possible ablation if needed.    Shawnie Pons 2:28 PM 12/17/2011

## 2011-12-19 DIAGNOSIS — I420 Dilated cardiomyopathy: Secondary | ICD-10-CM | POA: Insufficient documentation

## 2011-12-19 NOTE — Assessment & Plan Note (Addendum)
It has gone up and down in the past related to his atrial fib.  Ischemia has not been excluded, but no symptoms.  ? Latent effect of MVR.  Restored previously with restoration of NSR.  See above.  Will need repeat after NSR, but patient is always hesitant due to cost.

## 2011-12-19 NOTE — Assessment & Plan Note (Signed)
Well-controlled at the present time. ?

## 2011-12-19 NOTE — Anesthesia Postprocedure Evaluation (Signed)
  Anesthesia Post-op Note  Patient: [name]  Procedure(s) Performed: Procedure(s) (LRB): CARDIOVERSION (N/A)  Patient Location: Short Stay C  Anesthesia Type: MAC  Level of Consciousness: awake, alert  and oriented  Airway and Oxygen Therapy: Patient Spontanous Breathing  Post-op Pain: none  Post-op Assessment: Post-op Vital signs reviewed, Patient's Cardiovascular Status Stable, Respiratory Function Stable, Patent Airway and No signs of Nausea or vomiting  Post-op Vital Signs: Reviewed and stable  Complications: No apparent anesthesia complications

## 2011-12-19 NOTE — Assessment & Plan Note (Signed)
He is back in atrial fibrillation, and his echo study is consistent with an overall reduction in function.  He does not feel it, but does get worse when he has this.  Often goes for periods after cardioversion.  Will refer to Dr. Johney Frame for consideration of options, and also in the interim, since he is well anticoagulated, I will go ahead and make arrangements for him to have a cardioversion.  He has tolerated these well in the past, and the current time should be no different.  He understands and consents to proceed.  tS

## 2011-12-21 ENCOUNTER — Encounter (HOSPITAL_COMMUNITY): Payer: Self-pay | Admitting: Cardiology

## 2012-01-10 ENCOUNTER — Encounter: Payer: Self-pay | Admitting: Internal Medicine

## 2012-01-10 ENCOUNTER — Ambulatory Visit (INDEPENDENT_AMBULATORY_CARE_PROVIDER_SITE_OTHER): Payer: Medicare Other | Admitting: Internal Medicine

## 2012-01-10 VITALS — BP 141/84 | HR 100 | Resp 18 | Ht 71.0 in | Wt 188.4 lb

## 2012-01-10 DIAGNOSIS — Z9889 Other specified postprocedural states: Secondary | ICD-10-CM

## 2012-01-10 DIAGNOSIS — I4891 Unspecified atrial fibrillation: Secondary | ICD-10-CM

## 2012-01-10 DIAGNOSIS — I420 Dilated cardiomyopathy: Secondary | ICD-10-CM

## 2012-01-10 DIAGNOSIS — I428 Other cardiomyopathies: Secondary | ICD-10-CM

## 2012-01-10 DIAGNOSIS — I10 Essential (primary) hypertension: Secondary | ICD-10-CM

## 2012-01-10 MED ORDER — CARVEDILOL 3.125 MG PO TABS: 3.1250 mg | ORAL_TABLET | Freq: Two times a day (BID) | ORAL | Status: DC

## 2012-01-10 NOTE — Patient Instructions (Signed)
Your physician recommends that you schedule a follow-up appointment in: 6 weeks with Dr Johney Frame  Your physician has recommended you make the following change in your medication:  1)Start Carvedilol 3.125mg  twice daily  Your physician recommends that you return for lab work today--Digoxin level  Will have Weston Brass, Pharm D call you in regards to going into hospital for Tikosyn load

## 2012-01-10 NOTE — Assessment & Plan Note (Signed)
As above.

## 2012-01-10 NOTE — Assessment & Plan Note (Signed)
Above goal Add coreg 

## 2012-01-10 NOTE — Assessment & Plan Note (Signed)
Likely due to valvular heart disease I will add coreg today.  This can be titrated by Dr Riley Kill.  I am concerned about risks of sudden death long term.  I offered ICD implantation today, however, the patient is quite clear that he would not wish to proceed with ICD implantation at this time.

## 2012-01-10 NOTE — Assessment & Plan Note (Signed)
Symptomatic persistent afib I have recommended initiation of AAD.  I think that presently, [name] is the safest option.  He is willing to proceed with tikosyn.  Given his valvular afib as well as LA size of 50mm, I am concerned that control of his afib may be difficult long term. We will have our tikosyn clinic make arrangements for hospitalization for initiation of tikosyn within the next few weeks. Check digoxin level today

## 2012-01-10 NOTE — Progress Notes (Signed)
Primary Care Physician: Crawford Givens, MD, MD Referring Physician:  Dr Levora Dredge [name] is a 78 y.o. male with a h/o persistent atrial fibrillation and nonischemic cardiomyopathy who presents for EP consultation regarding treatment for afib.  He reports that he has had afib since the 1990s.  Recently, he finds that episodes occur infrequently.  His afib is associated with worsening of CHF.  He recently presented to Dr Riley Kill with afib and CHF and was cardioverted.  He reports immediate improvements to exercise tolerance and CHF. He has not tried an AAD but has been cardioverted several times.  He is on coumadin for stroke prevention.  He is also treated with digoxin for CHF.  He is not presently on a beta blocker. Today, he denies symptoms of palpitations, chest pain, shortness of breath, orthopnea, PND, lower extremity edema, dizziness, presyncope, syncope, or neurologic sequela. The patient is tolerating medications without difficulties and is otherwise without complaint today.   Past Medical History  Diagnosis Date  . Coronary atherosclerosis of unspecified type of vessel, native or graft 2008    cardioversion. Echo mod LVH EF 25%, Triv AR  Mild LAE, RAE 11/15/*11  . Encounter for long-term (current) use of anticoagulants   . Atrial fibrillation   . Pure hypercholesterolemia   . Esophageal reflux   . Sprain of neck   . Dizziness and giddiness   . Iron deficiency anemia, unspecified   . Unspecified sleep apnea   . Left heart failure   . Personal history of other diseases of digestive system   . Diaphragmatic hernia without mention of obstruction or gangrene   . Unspecified hemorrhoids without mention of complication   . Diverticulosis of colon (without mention of hemorrhage)   . Encounter for long-term (current) use of other medications   . Encounter for therapeutic drug monitoring   . Personal history of peptic ulcer disease   . Gastritis 12/18/08    EGD, 3 clips  remaining (Dr. Mechele Collin)  . Upper GI bleed 7/15-7/20/09    Hosp.  Elevated INR, coumadin stopped sinusitis   Past Surgical History  Procedure Date  . Mitral valve repair 1995  . Cholecystectomy   . Partial gastrectomy   . Appendectomy   . Cataract extraction 10/13/08    left  . Mitral valve annuloplasty 1996  . Cardioversion 12/17/2011    Procedure: CARDIOVERSION;  Surgeon: Herby Abraham, MD;  Location: [facility] OR;  Service: Cardiovascular;  Laterality: N/A;    Current Outpatient Prescriptions  Medication Sig Dispense Refill  . Coenzyme Q10 (COQ10) 100 MG CAPS Take 1 tablet by mouth daily.       . cyanocobalamin 100 MCG tablet Take 100 mcg by mouth daily.        . digoxin (LANOXIN) 0.25 MG tablet Take 250 mcg by mouth daily.      . digoxin (LANOXIN) 0.25 MG tablet TAKE ONE TABLET BY MOUTH EVERY DAY  90 tablet  3  . enalapril (VASOTEC) 5 MG tablet Take 5 mg by mouth 2 (two) times daily.      . finasteride (PROSCAR) 5 MG tablet Take 5 mg by mouth daily.      . furosemide (LASIX) 40 MG tablet Take 40 mg by mouth daily.       . hydroxypropyl methylcellulose (ISOPTO TEARS) 2.5 % ophthalmic solution Place 1 drop into both eyes every morning.      . metroNIDAZOLE (METROGEL) 1 % gel Apply 1 application topically daily as needed. For  face      . Multiple Vitamin (MULTIVITAMIN) tablet Take 1 tablet by mouth daily.        Marland Kitchen omeprazole (PRILOSEC) 20 MG capsule Take 20 mg by mouth daily.      . potassium chloride (KLOR-CON 10) 10 MEQ CR tablet Take 10 mEq by mouth daily.       . Red Yeast Rice 600 MG CAPS Take by mouth 2 (two) times daily.       . Tamsulosin HCl (FLOMAX) 0.4 MG CAPS Take 0.4 mg by mouth daily.      Marland Kitchen warfarin (COUMADIN) 5 MG tablet Take 5 mg by mouth daily.      . carvedilol (COREG) 3.125 MG tablet Take 1 tablet (3.125 mg total) by mouth 2 (two) times daily.  60 tablet  11    Allergies  Allergen Reactions  . Atorvastatin Other (See Comments)    REACTION:muscular soreness     History   Social History  . Marital Status: Married    Spouse Name: N/A    Number of Children: 3  . 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 Smoking status: Never Smoker   . Smokeless tobacco: Never Used  . Alcohol Use: Yes     socially, beer   . Drug Use: No  . Sexually Active: Not on file   Other Topics Concern  . Not on file   Social History Narrative   Daily caffeine use.    Family History  Problem Relation Age of Onset  . Cancer Father     lung    ROS- All systems are reviewed and negative except as per the HPI above  Physical Exam: Filed Vitals:   01/10/12 1042  BP: 141/84  Pulse: 100  Resp: 18  Height: 5\' 11"  (1.803 m)  Weight: 188 lb 6.4 oz (85.458 kg)    GEN- The patient is well appearing, alert and oriented x 3 today.   Head- normocephalic, atraumatic Eyes-  Sclera clear, conjunctiva pink Ears- hearing intact Oropharynx- clear Neck- supple, no JVP Lymph- no cervical lymphadenopathy Lungs- Clear to ausculation bilaterally, normal work of breathing Heart- Regular rate and rhythm, no murmurs, rubs or gallops, PMI not laterally displaced GI- soft, NT, ND, + BS Extremities- no clubbing, cyanosis, or edema MS- no significant deformity or atrophy Skin- no rash or lesion Psych- euthymic mood, full affect Neuro- strength and sensation are intact  EKG today reveals sinus rhythm 98 bpm, PR 174, QTc 420, nonsustained VT also observed  Assessment and Plan:

## 2012-01-13 ENCOUNTER — Other Ambulatory Visit: Payer: Self-pay | Admitting: Family Medicine

## 2012-01-13 NOTE — Progress Notes (Signed)
Addended by: Reine Just on: 01/13/2012 10:31 AM   Modules accepted: Orders

## 2012-01-13 NOTE — Telephone Encounter (Signed)
Please clarify with cards if we should fill.  If they are going to follow long term, then the rx should come through them.

## 2012-01-13 NOTE — Telephone Encounter (Signed)
Refill request for coumadin.  I'm not sure who is following pt's protimes, looks like previously by Dr. Hetty Ely but most recently by Dr. Riley Kill.  Pt has not yet established with you , has no upcoming appts scheduled at this office.

## 2012-01-14 NOTE — Telephone Encounter (Signed)
Spoke with pt.  He has been getting his INRs followed by the [facility] office and would like to continue with this practice.  When he had it checked by Dr. Riley Kill, that was a one time thing.  He only has Coumadin to last for today.  Needs refill sent in ASAP.

## 2012-01-14 NOTE — Telephone Encounter (Signed)
New Problem:     Patient called in unsure if his warfarin (COUMADIN) 5 MG tablet was supposed to be filled with [place].  Patient also wanted to know about his upcoming appointment on Monday.  Please call back.

## 2012-01-17 ENCOUNTER — Other Ambulatory Visit: Payer: Self-pay

## 2012-01-17 ENCOUNTER — Ambulatory Visit (INDEPENDENT_AMBULATORY_CARE_PROVIDER_SITE_OTHER): Payer: Medicare Other | Admitting: Pharmacist

## 2012-01-17 VITALS — BP 150/76 | HR 75 | Ht 71.0 in | Wt 189.5 lb

## 2012-01-17 DIAGNOSIS — I4891 Unspecified atrial fibrillation: Secondary | ICD-10-CM

## 2012-01-17 NOTE — Telephone Encounter (Signed)
rx sent.  Thanks.  

## 2012-01-17 NOTE — Telephone Encounter (Signed)
Pt said wanted when refill for Warfarin 5 mg would be sent to [facility]. I spoke with Zenaida Niece at [facility] and Warfarin 5 mg is ready for pick up. Patient's wife notified as instructed by telephone that rx ready.

## 2012-01-17 NOTE — Progress Notes (Signed)
HPI  [name] is a 78 y.o. male with a h/o persistent atrial fibrillation and nonischemic cardiomyopathy who presents for evaluation for Tikosyn admission.  He was recently seen by Dr. Johney Frame.  He has had atrial fibrillation since the 1990s.  He does not report frequent episodes of afib, but the episodes are associated with worsening CHF.  He was most recently cardioverted on 3/15 and was started on a beta-blocker by Dr. Johney Frame on 4/8.   Discussed possible side effects of Tikosyn with patient including QTc prolongation, nausea, and headache.  Pt is aware of the importance of compliance as well as to call the office if he misses more than 2 doses of Coumadin.  I have talked with his insurance company.  The medication does not require a prior authorization but is a Tier 4 drug.  Pt is aware and will let us know if he can afford the medication prior to admission.    Reviewed patient's medication list.  He is not currently on any QTc prolongating medications or contra-indicated medications.  He is on digoxin and his most recent level was 1.2.  Will likely need dose decrease if this medication is continued with Tikosyn.  Will recheck digoxin level today.     Current Outpatient Prescriptions  Medication Sig Dispense Refill  . carvedilol (COREG) 3.125 MG tablet Take 1 tablet (3.125 mg total) by mouth 2 (two) times daily.  60 tablet  11  . Coenzyme Q10 (COQ10) 100 MG CAPS Take 1 tablet by mouth daily.       . cyanocobalamin 100 MCG tablet Take 100 mcg by mouth daily.        . digoxin (LANOXIN) 0.25 MG tablet TAKE ONE TABLET BY MOUTH EVERY DAY  90 tablet  3  . enalapril (VASOTEC) 5 MG tablet Take 5 mg by mouth 2 (two) times daily.      . finasteride (PROSCAR) 5 MG tablet Take 5 mg by mouth daily.      . fish oil-omega-3 fatty acids 1000 MG capsule Take 1 g by mouth daily.      . furosemide (LASIX) 40 MG tablet Take 40 mg by mouth daily.       . hydroxypropyl methylcellulose (ISOPTO TEARS) 2.5 %  ophthalmic solution Place 1 drop into both eyes every morning.      . metroNIDAZOLE (METROGEL) 1 % gel Apply 1 application topically daily as needed. For face      . Multiple Vitamin (MULTIVITAMIN) tablet Take 1 tablet by mouth daily.        Marland Kitchen omeprazole (PRILOSEC) 20 MG capsule Take 20 mg by mouth daily.      . potassium chloride (KLOR-CON 10) 10 MEQ CR tablet Take 10 mEq by mouth daily.       . Red Yeast Rice 600 MG CAPS Take 600 mg by mouth 2 (two) times daily.       . Tamsulosin HCl (FLOMAX) 0.4 MG CAPS Take 0.4 mg by mouth daily.      Marland Kitchen warfarin (COUMADIN) 5 MG tablet Take 5 mg by mouth daily.      Marland Kitchen warfarin (COUMADIN) 5 MG tablet TAKE ONE TABLET BY MOUTH EVERY DAY AS DIRECTED  30 tablet  12    Allergies  Allergen Reactions  . Atorvastatin Other (See Comments)    REACTION:muscular soreness

## 2012-01-17 NOTE — Assessment & Plan Note (Addendum)
After workup was completed, pt called his insurance company and discovered his monthly copay for Tikosyn was going to be $95.  Unfortunately, this is too much for him to pay on a monthly basis and because he has Medicare Part D, he does not qualify for patient assistance programs.  I have discussed with Dr. Johney Frame.  We will not start Tikosyn at this time.  Pt has appt scheduled with Dr. Johney Frame in May. Will discuss alternative treatment options at that time.

## 2012-01-17 NOTE — Telephone Encounter (Signed)
Dr. Para March, please see note below.

## 2012-01-25 ENCOUNTER — Other Ambulatory Visit: Payer: Self-pay | Admitting: Internal Medicine

## 2012-01-25 ENCOUNTER — Other Ambulatory Visit: Payer: Self-pay | Admitting: Family Medicine

## 2012-01-27 ENCOUNTER — Other Ambulatory Visit: Payer: Self-pay | Admitting: Family Medicine

## 2012-01-27 NOTE — Telephone Encounter (Signed)
Med already refilled and sent to [facility] [place](spoke with Vickie at [facility] rx is ready for pick up).Patient's wife notified as instructed by telephone that rx is ready for pick up.

## 2012-01-31 ENCOUNTER — Encounter: Payer: Self-pay | Admitting: Family Medicine

## 2012-02-01 ENCOUNTER — Other Ambulatory Visit: Payer: Self-pay | Admitting: Family Medicine

## 2012-02-01 ENCOUNTER — Telehealth: Payer: Self-pay | Admitting: Cardiology

## 2012-02-01 MED ORDER — WARFARIN SODIUM 5 MG PO TABS: 5.0000 mg | ORAL_TABLET | Freq: Every day | ORAL | Status: DC

## 2012-02-01 NOTE — Telephone Encounter (Signed)
Checked in last OV notes to see when pt needs ROV but it was not documented. I sent a staff message to ask Lauren when is pt next f/u needed.

## 2012-02-05 ENCOUNTER — Other Ambulatory Visit: Payer: Self-pay | Admitting: Family Medicine

## 2012-02-14 ENCOUNTER — Other Ambulatory Visit: Payer: Self-pay | Admitting: Family Medicine

## 2012-02-21 ENCOUNTER — Ambulatory Visit: Payer: Medicare Other | Admitting: Internal Medicine

## 2012-02-22 ENCOUNTER — Other Ambulatory Visit: Payer: Self-pay | Admitting: *Deleted

## 2012-02-22 NOTE — Telephone Encounter (Signed)
I don't think you have seen this patient.  Is it okay to refill med?

## 2012-02-23 MED ORDER — TAMSULOSIN HCL 0.4 MG PO CAPS: 0.4000 mg | ORAL_CAPSULE | Freq: Every day | ORAL | Status: DC

## 2012-02-23 NOTE — Telephone Encounter (Signed)
CPE scheduled for 08/29/12.  Pt is aware.

## 2012-02-23 NOTE — Telephone Encounter (Signed)
Sent, schedule CPE later this fall.  Thanks.

## 2012-02-25 ENCOUNTER — Encounter: Payer: Self-pay | Admitting: Family Medicine

## 2012-03-22 ENCOUNTER — Other Ambulatory Visit: Payer: Self-pay | Admitting: Cardiology

## 2012-03-29 ENCOUNTER — Encounter: Payer: Self-pay | Admitting: Family Medicine

## 2012-03-29 ENCOUNTER — Telehealth: Payer: Self-pay | Admitting: Radiology

## 2012-03-29 NOTE — Telephone Encounter (Signed)
Patient is requesting a new RX be faxed to [facility] for his protime / INR checks. RX to include dx, and fax number for them to fax back results.Please give it to Harmoney Sienkiewicz in the lab and she will make sure it is faxed,

## 2012-03-29 NOTE — Telephone Encounter (Signed)
Please send in.  Thanks.  Hand written.

## 2012-04-27 ENCOUNTER — Other Ambulatory Visit: Payer: Self-pay | Admitting: Family Medicine

## 2012-04-27 LAB — PROTIME-INR: INR: 1.9 — ABNORMAL HIGH (ref 0.8–1.2)

## 2012-04-28 NOTE — Progress Notes (Signed)
Patient notified of results, hasn't missed any doses. Will recheck 05-10-12, @ [facility]

## 2012-05-12 ENCOUNTER — Other Ambulatory Visit: Payer: Self-pay | Admitting: *Deleted

## 2012-05-12 MED ORDER — FINASTERIDE 5 MG PO TABS: 5.0000 mg | ORAL_TABLET | Freq: Every day | ORAL | Status: DC

## 2012-05-12 NOTE — Telephone Encounter (Signed)
Okay, sent.

## 2012-05-12 NOTE — Telephone Encounter (Signed)
No OV with you, only Dr. Hetty Ely.  However does have CPE scheduled with you in November.  OK for RF's till November?

## 2012-05-23 ENCOUNTER — Other Ambulatory Visit: Payer: Self-pay | Admitting: Family Medicine

## 2012-06-19 ENCOUNTER — Other Ambulatory Visit: Payer: Self-pay | Admitting: *Deleted

## 2012-06-19 MED ORDER — ENALAPRIL MALEATE 5 MG PO TABS: 5.0000 mg | ORAL_TABLET | Freq: Two times a day (BID) | ORAL | Status: DC

## 2012-06-19 NOTE — Telephone Encounter (Signed)
Had OV scheduled.  rx sent.

## 2012-06-19 NOTE — Telephone Encounter (Signed)
Faxed refill request.  This patient has not been seen in this office in > 1 year.

## 2012-06-20 ENCOUNTER — Other Ambulatory Visit: Payer: Self-pay | Admitting: Cardiology

## 2012-06-28 ENCOUNTER — Other Ambulatory Visit: Payer: Self-pay | Admitting: Family Medicine

## 2012-06-30 ENCOUNTER — Telehealth: Payer: Self-pay | Admitting: Family Medicine

## 2012-06-30 ENCOUNTER — Other Ambulatory Visit: Payer: Self-pay | Admitting: Cardiology

## 2012-06-30 NOTE — Telephone Encounter (Signed)
Caller: Rubert/Patient; Patient Name: [name]; PCP: Crawford Givens Clelia Croft) [facility]); Best Callback Phone Number: [phone].  Patient returning call to office regarding lab results.  Per Epic, advised per Dr. Para March that INR was 3.0, and assuming no missed doses or bleeding, may continue as is, and recheck in 4 weeks.  Patient states he is comfortable with this; no further questions or concerns at this time.

## 2012-07-27 ENCOUNTER — Other Ambulatory Visit: Payer: Self-pay | Admitting: Family Medicine

## 2012-07-28 LAB — PROTIME-INR: Prothrombin Time: 31.4 s — ABNORMAL HIGH (ref 9.1–12.0)

## 2012-08-23 ENCOUNTER — Other Ambulatory Visit: Payer: Self-pay | Admitting: *Deleted

## 2012-08-23 MED ORDER — TAMSULOSIN HCL 0.4 MG PO CAPS: 0.4000 mg | ORAL_CAPSULE | Freq: Every day | ORAL | Status: DC

## 2012-08-28 ENCOUNTER — Other Ambulatory Visit: Payer: Self-pay | Admitting: Family Medicine

## 2012-08-29 ENCOUNTER — Encounter: Payer: Medicare Other | Admitting: Family Medicine

## 2012-09-06 ENCOUNTER — Telehealth: Payer: Self-pay | Admitting: Family Medicine

## 2012-09-06 NOTE — Telephone Encounter (Signed)
Patient Information:  Caller Name: Exavier  Phone: [phone]  Patient: [name], [name]  Gender: Male  DOB: 1932/11/18  Age: 78 Years  PCP: Crawford Givens Clelia Croft) [facility])   Symptoms  Reason For Call & Symptoms: Has been more short of breath with usual activities 2-3 weeks prior to call. Thinks "heart is out of rhythm.  Reviewed Health History In EMR: Yes  Reviewed Medications In EMR: Yes  Reviewed Allergies In EMR: Yes  Reviewed Surgeries / Procedures: Yes  Date of Onset of Symptoms: 08/16/2012  Guideline(s) Used:  Breathing Difficulty  Disposition Per Guideline:   Go to ED Now  Reason For Disposition Reached:   Extra heart beats OR irregular heart beating (i.e., "palpitations")  Advice Given:  N/A  Office Follow Up:  Does the office need to follow up with this patient?: No  Instructions For The Office: N/A  Patient Refused Recommendation:  Patient Will Follow Up With Office Later  Patient states has scheduled appointment for 12-5 and will wait to see doctor then instead of going to ED.

## 2012-09-06 NOTE — Telephone Encounter (Signed)
Per note, he was advised to go to ER.

## 2012-09-07 ENCOUNTER — Encounter: Payer: Self-pay | Admitting: Family Medicine

## 2012-09-07 ENCOUNTER — Ambulatory Visit (INDEPENDENT_AMBULATORY_CARE_PROVIDER_SITE_OTHER): Payer: Medicare Other | Admitting: Family Medicine

## 2012-09-07 VITALS — BP 136/70 | HR 65 | Temp 97.7°F | Wt 184.8 lb

## 2012-09-07 DIAGNOSIS — R5383 Other fatigue: Secondary | ICD-10-CM

## 2012-09-07 DIAGNOSIS — Z8679 Personal history of other diseases of the circulatory system: Secondary | ICD-10-CM

## 2012-09-07 DIAGNOSIS — R5381 Other malaise: Secondary | ICD-10-CM

## 2012-09-07 NOTE — Patient Instructions (Addendum)
Go to the lab on the way out.  We'll contact you with your lab report. Don't change your meds for now.  I'll notify the cardiology clinic in the meantime.  Take care.

## 2012-09-07 NOTE — Progress Notes (Signed)
2 months of lack of energy.  No CP, not SOB.  No BLE edema.  No recent BP med changes.  No presyncope.  Occ lightheaded on standing.  Feels well sitting down or laying down. No known blood loss, no bleeding seen.  No FCNAV.  Occ diarrhea intermittently but no black stools. No cough.  He can take a deep breath w/o difficulty.  He doesn't have exercise intolerance as he did prev when he was in a fib.    Meds, vitals, and allergies reviewed.   ROS: See HPI.  Otherwise, noncontributory.  nad ncat Mmm rrr with ectopy noted during exam ctab abd soft, not ttp Ext w/o edema

## 2012-09-08 DIAGNOSIS — R5383 Other fatigue: Secondary | ICD-10-CM | POA: Insufficient documentation

## 2012-09-08 LAB — CBC WITH DIFFERENTIAL/PLATELET
Basophils Relative: 0.5 % (ref 0.0–3.0)
Eosinophils Relative: 2.3 % (ref 0.0–5.0)
Hemoglobin: 14.2 g/dL (ref 13.0–17.0)
MCV: 87.1 fl (ref 78.0–100.0)
Monocytes Absolute: 0.6 10*3/uL (ref 0.1–1.0)
Neutro Abs: 2.9 10*3/uL (ref 1.4–7.7)
Neutrophils Relative %: 46.9 % (ref 43.0–77.0)
RBC: 4.95 Mil/uL (ref 4.22–5.81)
WBC: 6.1 10*3/uL (ref 4.5–10.5)

## 2012-09-08 LAB — COMPREHENSIVE METABOLIC PANEL
Albumin: 3.8 g/dL (ref 3.5–5.2)
BUN: 9 mg/dL (ref 6–23)
Calcium: 8.7 mg/dL (ref 8.4–10.5)
Chloride: 99 mEq/L (ref 96–112)
Glucose, Bld: 101 mg/dL — ABNORMAL HIGH (ref 70–99)
Potassium: 4.5 mEq/L (ref 3.5–5.1)

## 2012-09-08 LAB — TSH: TSH: 0.19 u[IU]/mL — ABNORMAL LOW (ref 0.35–5.50)

## 2012-09-08 NOTE — Assessment & Plan Note (Signed)
He had ectopy during the exam but it doesn't appear to be a fib based on the EKG.  Will notify cards in meantime for input on EKG and possible ambulatory monitor.  Will check basic labs in meantime.  >25 min spent with face to face with patient, >50% counseling and/or coordinating care.

## 2012-09-10 ENCOUNTER — Other Ambulatory Visit: Payer: Self-pay | Admitting: Family Medicine

## 2012-09-10 DIAGNOSIS — R7989 Other specified abnormal findings of blood chemistry: Secondary | ICD-10-CM

## 2012-09-15 ENCOUNTER — Encounter: Payer: Self-pay | Admitting: Cardiovascular Disease

## 2012-09-15 ENCOUNTER — Ambulatory Visit: Payer: Medicare Other | Admitting: Physician Assistant

## 2012-09-15 ENCOUNTER — Ambulatory Visit (INDEPENDENT_AMBULATORY_CARE_PROVIDER_SITE_OTHER): Payer: Medicare Other | Admitting: Cardiovascular Disease

## 2012-09-15 VITALS — BP 142/84 | HR 90 | Ht 71.0 in | Wt 183.5 lb

## 2012-09-15 DIAGNOSIS — I4891 Unspecified atrial fibrillation: Secondary | ICD-10-CM

## 2012-09-15 DIAGNOSIS — I420 Dilated cardiomyopathy: Secondary | ICD-10-CM

## 2012-09-15 DIAGNOSIS — R0602 Shortness of breath: Secondary | ICD-10-CM

## 2012-09-15 DIAGNOSIS — I428 Other cardiomyopathies: Secondary | ICD-10-CM

## 2012-09-15 MED ORDER — DIGOXIN 125 MCG PO TABS: 0.1250 mg | ORAL_TABLET | Freq: Every day | ORAL | Status: DC

## 2012-09-15 MED ORDER — AMIODARONE HCL 200 MG PO TABS: 200.0000 mg | ORAL_TABLET | Freq: Two times a day (BID) | ORAL | Status: DC

## 2012-09-15 MED ORDER — CARVEDILOL 6.25 MG PO TABS: 6.2500 mg | ORAL_TABLET | Freq: Two times a day (BID) | ORAL | Status: DC

## 2012-09-15 MED ORDER — WARFARIN SODIUM 4 MG PO TABS: 4.0000 mg | ORAL_TABLET | Freq: Every day | ORAL | Status: DC

## 2012-09-15 NOTE — Patient Instructions (Addendum)
Increase Coreg to 6.25 mg twice daily.  Decrease Digoxin to 0.125 mg once daily.  Start Amiodarone 200 mg twice daily.  Decrease Warfarin to 4 mg daily.  Check labs (PT/INR) in 5 days.   Follow up in 3 weeks.

## 2012-09-15 NOTE — Assessment & Plan Note (Addendum)
The patient is back in atrial fibrillation. He is usually symptomatic when he goes into atrial fibrillation mostly with increased dyspnea and fatigue. He is actually not feeling as bad now as he felt was most recent episodes of atrial fibrillation. I think in order to maintain sinus rhythm, an antiarrhythmic medication is needed. He could not afford dofetilide and thus the only other option would be amiodarone. Thus, I will go ahead and start him today on amiodarone 200 mg twice daily. I will decrease the dose of digoxin to 0.125 mg once daily and likely stop this medication later if he maintains sinus rhythm. Due to interaction between amiodarone and warfarin, his INR will need to be monitored carefully. I will also decrease the dose of warfarin to 4 mg daily. PT/INR will be requested in 5 days. I will consider cardioversion in 3-4 weeks.

## 2012-09-15 NOTE — Progress Notes (Signed)
HPI  This is a pleasant 78 year old male who is here today to establish cardiovascular care with me. He has seen Dr. Riley Parsons for many years. However, he lives in [place] and reports increased difficulty in going to his appointments. He has a h/o persistent atrial fibrillation and nonischemic cardiomyopathy . He reports that he has had afib since the 1990s.  His afib is associated with worsening of CHF. He had multiple cardioversions performed but he has not been on any antiarrhythmic medication. He has known history of mitral valve disease status post mitral valve repair in the 90s. He has no history of coronary artery disease. He is on coumadin for stroke prevention. He is also treated with digoxin for CHF.  Last cardioversion was early this year. He saw Dr. Johney Parsons for consultation after that. He suggested starting him on dofetilide. However, the patient could not afford the medication due to cost reasons. He presented recently to Dr. Lianne Parsons office with increased fatigue and dyspnea. He was noted to be back in atrial fibrillation. He denies chest pain.  Allergies  Allergen Reactions  . Atorvastatin Other (See Comments)    REACTION:muscular soreness     Current Outpatient Prescriptions on File Prior to Visit  Medication Sig Dispense Refill  . Coenzyme Q10 (COQ10) 100 MG CAPS Take 1 tablet by mouth daily.       . cyanocobalamin 100 MCG tablet Take 100 mcg by mouth daily.        . enalapril (VASOTEC) 5 MG tablet Take 5 mg by mouth 2 (two) times daily.      . finasteride (PROSCAR) 5 MG tablet Take 5 mg by mouth daily.      . fish oil-omega-3 fatty acids 1000 MG capsule Take 1 g by mouth daily.      . furosemide (LASIX) 40 MG tablet Take 40 mg by mouth as needed.      . hydroxypropyl methylcellulose (ISOPTO TEARS) 2.5 % ophthalmic solution Place 1 drop into both eyes every morning.      . metroNIDAZOLE (METROGEL) 1 % gel Apply 1 application topically daily as needed. For face      . Multiple  Vitamin (MULTIVITAMIN) tablet Take 1 tablet by mouth daily.        Marland Kitchen omeprazole (PRILOSEC) 20 MG capsule TAKE ONE CAPSULE BY MOUTH EVERY DAY  90 capsule  2  . potassium chloride (KLOR-CON 10) 10 MEQ CR tablet Take 10 mEq by mouth daily.       . Red Yeast Rice 600 MG CAPS Take 600 mg by mouth 2 (two) times daily.       . Tamsulosin HCl (FLOMAX) 0.4 MG CAPS Take 1 capsule (0.4 mg total) by mouth daily.  30 capsule  0  . amiodarone (PACERONE) 200 MG tablet Take 1 tablet (200 mg total) by mouth 2 (two) times daily.  60 tablet  3  . digoxin (LANOXIN) 0.125 MG tablet Take 1 tablet (0.125 mg total) by mouth daily.  30 tablet  3  . warfarin (COUMADIN) 4 MG tablet Take 1 tablet (4 mg total) by mouth daily.  30 tablet  3     Past Medical History  Diagnosis Date  . Coronary atherosclerosis of unspecified type of vessel, native or graft 2008    cardioversion. Echo mod LVH EF 25%, Triv AR  Mild LAE, RAE 11/15/*11  . Long term (current) use of anticoagulants   . Atrial fibrillation   . Pure hypercholesterolemia   . Esophageal reflux   .  Sprain of neck   . Dizziness and giddiness   . Iron deficiency anemia, unspecified   . Unspecified sleep apnea   . Left heart failure   . Personal history of other diseases of digestive system   . Diaphragmatic hernia without mention of obstruction or gangrene   . Unspecified hemorrhoids without mention of complication   . Diverticulosis of colon (without mention of hemorrhage)   . Encounter for long-term (current) use of other medications   . Encounter for therapeutic drug monitoring   . Personal history of peptic ulcer disease   . Gastritis 12/18/08    EGD, 3 clips remaining (Dr. Mechele Parsons)  . Upper GI bleed 7/15-7/20/09    Hosp.  Elevated INR, coumadin stopped sinusitis  . Hypertension   . CHF (congestive heart failure)      Past Surgical History  Procedure Date  . Mitral valve repair 1995  . Cholecystectomy   . Partial gastrectomy   . Appendectomy   .  Cataract extraction 10/13/08    left  . Mitral valve annuloplasty 1996  . Cardioversion 12/17/2011    Procedure: CARDIOVERSION;  Surgeon: Jeremy Abraham, MD;  Location: [facility] OR;  Service: Cardiovascular;  Laterality: N/A;  . Cardiac catheterization 1996    [facility]  . Cardiac catheterization   . Cardiac catheterization      Family History  Problem Relation Age of Onset  . Cancer Father     lung     History   Social History  . Marital Status: Married    Spouse Name: N/A    Number of Children: 3  . 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 Smoking status: Never Smoker   . Smokeless tobacco: Never Used  . Alcohol Use: Yes     Comment: socially, beer   . Drug Use: No  . Sexually Active: Not on file   Other Topics Concern  . Not on file   Social History Narrative   Daily caffeine use.     PHYSICAL EXAM   BP 142/84  Pulse 90  Ht 5\' 11"  (1.803 m)  Wt 183 lb 8 oz (83.235 kg)  BMI 25.59 kg/m2 Constitutional: He is oriented to person, place, and time. He appears well-developed and well-nourished. No distress.  HENT: No nasal discharge.  Head: Normocephalic and atraumatic.  Eyes: Pupils are equal and round. Right eye exhibits no discharge. Left eye exhibits no discharge.  Neck: Normal range of motion. Neck supple. No JVD present. No thyromegaly present.  Cardiovascular: Normal rate, irregular rhythm, normal heart sounds and. Exam reveals no gallop and no friction rub. No murmur heard.  Pulmonary/Chest: Effort normal and breath sounds normal. No stridor. No respiratory distress. He has no wheezes. He has no rales. He exhibits no tenderness.  Abdominal: Soft. Bowel sounds are normal. He exhibits no distension. There is no tenderness. There is no rebound and no guarding.  Musculoskeletal: Normal range of motion. He exhibits no edema and no tenderness.  Neurological: He is alert and oriented to person,  place, and time. Coordination normal.  Skin: Skin is warm and dry. No rash noted. He is not diaphoretic. No erythema. No pallor.  Psychiatric: He has a normal mood and affect. His behavior is normal. Judgment and thought content normal.       EKG: Atrial fibrillation -Poor R-wave progression -may be secondary to pulmonary disease   consider  old anterior infarct.   -Nonspecific ST depression   +   Diffuse nonspecific T-abnormality.   Rightward P/QRS axis and rotation -possible pulmonary disease.   ABNORMAL    ASSESSMENT AND PLAN

## 2012-09-15 NOTE — Assessment & Plan Note (Signed)
I will increase carvedilol to 6.25 mg twice daily. Continue treatment with enalapril. He appears to be euvolemic and he uses Lasix only as needed. I will consider adding spironolactone in the near future.

## 2012-09-20 ENCOUNTER — Other Ambulatory Visit: Payer: Self-pay | Admitting: Family Medicine

## 2012-09-30 ENCOUNTER — Other Ambulatory Visit: Payer: Self-pay | Admitting: Family Medicine

## 2012-10-05 ENCOUNTER — Telehealth: Payer: Self-pay | Admitting: Family Medicine

## 2012-10-05 MED ORDER — TAMSULOSIN HCL 0.4 MG PO CAPS: ORAL_CAPSULE | ORAL | Status: DC

## 2012-10-05 NOTE — Telephone Encounter (Signed)
Patient needs a New Rx for his Tamsulonsin .4mg  called into [facility] [place]. He is completely out and needs called in today. Please call the patient when it is called in  To pharmacy.

## 2012-10-05 NOTE — Telephone Encounter (Signed)
Sent!

## 2012-10-06 NOTE — Telephone Encounter (Signed)
Pt said no refill at [facility] for generic flomax. Spoke with Harrold Donath at [facility] has flomax rx on hold. Advised to fill rx and he said pt could pick up in 2 hours. Pt advised.

## 2012-10-09 ENCOUNTER — Telehealth: Payer: Self-pay

## 2012-10-09 ENCOUNTER — Encounter: Payer: Self-pay | Admitting: Cardiovascular Disease

## 2012-10-09 ENCOUNTER — Other Ambulatory Visit: Payer: Self-pay

## 2012-10-09 ENCOUNTER — Ambulatory Visit (INDEPENDENT_AMBULATORY_CARE_PROVIDER_SITE_OTHER): Payer: Medicare Other | Admitting: Cardiovascular Disease

## 2012-10-09 VITALS — BP 118/78 | HR 62 | Ht 71.0 in | Wt 186.8 lb

## 2012-10-09 DIAGNOSIS — I428 Other cardiomyopathies: Secondary | ICD-10-CM

## 2012-10-09 DIAGNOSIS — I420 Dilated cardiomyopathy: Secondary | ICD-10-CM

## 2012-10-09 DIAGNOSIS — I10 Essential (primary) hypertension: Secondary | ICD-10-CM

## 2012-10-09 DIAGNOSIS — I4891 Unspecified atrial fibrillation: Secondary | ICD-10-CM

## 2012-10-09 NOTE — Progress Notes (Signed)
HPI  This is a pleasant 79 year old male who is here today for a followup visit. He has been seen by Dr. Riley Kill for many years. However, he lives in [place] and reports increased difficulty in going to his appointments. He has a h/o persistent atrial fibrillation and nonischemic cardiomyopathy . He reports that he has had afib since the 1990s.  His afib is associated with worsening of CHF. He had multiple cardioversions performed but he has not been on any antiarrhythmic medication. He has known history of mitral valve disease status post mitral valve repair in the 90s. He has no history of coronary artery disease. He is on coumadin for stroke prevention. He is also treated with digoxin for CHF.  Last cardioversion was early this year. He saw Dr. Johney Frame for consultation after that. He suggested starting him on dofetilide. However, the patient could not afford the medication due to cost reasons. He presented recently to Dr. Lianne Bushy office with increased fatigue and dyspnea. He was noted to be back in atrial fibrillation. He denies chest pain. During last visit with me, I started him on amiodarone 200 mg twice daily and decrease digoxin to 0.125 mg daily. Coreg was also increased and warfarin was decreased to 4 mg daily due to interaction with amiodarone. He actually reports feeling better but continues to have dyspnea.  Allergies  Allergen Reactions  . Atorvastatin Other (See Comments)    REACTION:muscular soreness     Current Outpatient Prescriptions on File Prior to Visit  Medication Sig Dispense Refill  . amiodarone (PACERONE) 200 MG tablet Take 1 tablet (200 mg total) by mouth 2 (two) times daily.  60 tablet  3  . carvedilol (COREG) 6.25 MG tablet Take 1 tablet (6.25 mg total) by mouth 2 (two) times daily.  60 tablet  3  . Coenzyme Q10 (COQ10) 100 MG CAPS Take 1 tablet by mouth daily.       . cyanocobalamin 100 MCG tablet Take 100 mcg by mouth daily.        . digoxin (LANOXIN) 0.125 MG  tablet Take 1 tablet (0.125 mg total) by mouth daily.  30 tablet  3  . enalapril (VASOTEC) 5 MG tablet Take 5 mg by mouth 2 (two) times daily.      . finasteride (PROSCAR) 5 MG tablet Take 5 mg by mouth daily.      . fish oil-omega-3 fatty acids 1000 MG capsule Take 1 g by mouth daily.      . furosemide (LASIX) 40 MG tablet Take 40 mg by mouth as needed.      . hydroxypropyl methylcellulose (ISOPTO TEARS) 2.5 % ophthalmic solution Place 1 drop into both eyes every morning.      . Multiple Vitamin (MULTIVITAMIN) tablet Take 1 tablet by mouth daily.        Marland Kitchen omeprazole (PRILOSEC) 20 MG capsule TAKE ONE CAPSULE BY MOUTH EVERY DAY  90 capsule  2  . potassium chloride (KLOR-CON 10) 10 MEQ CR tablet Take 10 mEq by mouth daily.       . Red Yeast Rice 600 MG CAPS Take 600 mg by mouth 2 (two) times daily.       . Tamsulosin HCl (FLOMAX) 0.4 MG CAPS TAKE ONE CAPSULE BY MOUTH EVERY DAY  30 capsule  11  . warfarin (COUMADIN) 4 MG tablet Take 1 tablet (4 mg total) by mouth daily.  30 tablet  3     Past Medical History  Diagnosis Date  .  Coronary atherosclerosis of unspecified type of vessel, native or graft 2008    cardioversion. Echo mod LVH EF 25%, Triv AR  Mild LAE, RAE 11/15/*11  . Long term (current) use of anticoagulants   . Atrial fibrillation   . Pure hypercholesterolemia   . Esophageal reflux   . Sprain of neck   . Dizziness and giddiness   . Iron deficiency anemia, unspecified   . Unspecified sleep apnea   . Left heart failure   . Personal history of other diseases of digestive system   . Diaphragmatic hernia without mention of obstruction or gangrene   . Unspecified hemorrhoids without mention of complication   . Diverticulosis of colon (without mention of hemorrhage)   . Encounter for long-term (current) use of other medications   . Encounter for therapeutic drug monitoring   . Personal history of peptic ulcer disease   . Gastritis 12/18/08    EGD, 3 clips remaining (Dr. Mechele Collin)    . Upper GI bleed 7/15-7/20/09    Hosp.  Elevated INR, coumadin stopped sinusitis  . Hypertension   . CHF (congestive heart failure)      Past Surgical History  Procedure Date  . Mitral valve repair 1995  . Cholecystectomy   . Partial gastrectomy   . Appendectomy   . Cataract extraction 10/13/08    left  . Mitral valve annuloplasty 1996  . Cardioversion 12/17/2011    Procedure: CARDIOVERSION;  Surgeon: Herby Abraham, MD;  Location: [facility] OR;  Service: Cardiovascular;  Laterality: N/A;  . Cardiac catheterization 1996    [facility]  . Cardiac catheterization   . Cardiac catheterization      Family History  Problem Relation Age of Onset  . Cancer Father     lung     History   Social History  . Marital Status: Married    Spouse Name: N/A    Number of Children: 3  . 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 Smoking status: Never Smoker   . Smokeless tobacco: Never Used  . Alcohol Use: Yes     Comment: socially, beer   . Drug Use: No  . Sexually Active: Not on file   Other Topics Concern  . Not on file   Social History Narrative   Daily caffeine use.     PHYSICAL EXAM   BP 118/78  Pulse 62  Ht 5\' 11"  (1.803 m)  Wt 186 lb 12 oz (84.709 kg)  BMI 26.05 kg/m2 Constitutional: He is oriented to person, place, and time. He appears well-developed and well-nourished. No distress.  HENT: No nasal discharge.  Head: Normocephalic and atraumatic.  Eyes: Pupils are equal and round. Right eye exhibits no discharge. Left eye exhibits no discharge.  Neck: Normal range of motion. Neck supple. No JVD present. No thyromegaly present.  Cardiovascular: Normal rate, irregular rhythm, normal heart sounds and. Exam reveals no gallop and no friction rub. No murmur heard.  Pulmonary/Chest: Effort normal and breath sounds normal. No stridor. No respiratory distress. He has no wheezes. He has no rales. He exhibits no  tenderness.  Abdominal: Soft. Bowel sounds are normal. He exhibits no distension. There is no tenderness. There is no rebound and no guarding.  Musculoskeletal: Normal range of motion. He exhibits no edema and no tenderness.  Neurological: He is alert and oriented to person, place, and time. Coordination normal.  Skin: Skin  is warm and dry. No rash noted. He is not diaphoretic. No erythema. No pallor.  Psychiatric: He has a normal mood and affect. His behavior is normal. Judgment and thought content normal.       EKG: Atrial fibrillation  -Poor R-wave progression -may be secondary to pulmonary disease   consider old anterior infarct.   -  Nonspecific T-abnormality.   Low voltage -possible pulmonary disease.   ABNORMAL    ASSESSMENT AND PLAN

## 2012-10-09 NOTE — Patient Instructions (Addendum)

## 2012-10-09 NOTE — Telephone Encounter (Signed)
Message copied by Marcelle Overlie on Mon Oct 09, 2012  4:46 PM ------      Message from: Lorine Bears A      Created: Mon Oct 09, 2012  4:39 PM       She is asked him to stop taking digoxin. I don't want his heart rate to be be too slow after the cardioversion.

## 2012-10-09 NOTE — Assessment & Plan Note (Signed)
Continue treatment with enalapril and Coreg. He appears to be euvolemic and he uses Lasix only as needed. I will consider adding spironolactone in the near future.

## 2012-10-09 NOTE — Telephone Encounter (Signed)
Pt informed Understanding verb 

## 2012-10-09 NOTE — Assessment & Plan Note (Signed)
The patient is still in atrial fibrillation with ventricular rate is more controlled. He seems to be feeling slightly better. He has been tolerating amiodarone. He has been on this medication now for a few weeks. I will schedule the patient for cardioversion. We will have to make sure that his INR is therapeutic. I will also stop digoxin today given that his ventricular rate is somewhat slow and to prevent bradycardia after cardioversion.

## 2012-10-09 NOTE — Telephone Encounter (Signed)
Attempted to call pt at home number Someone picked up then no answer and they hung up on me I then attempted cell # and LMTCB

## 2012-10-09 NOTE — Telephone Encounter (Signed)
"  stop digoxin" VO Dr. Adline Mango, RN

## 2012-10-09 NOTE — Assessment & Plan Note (Signed)
His blood pressure is well-controlled 

## 2012-10-10 LAB — BASIC METABOLIC PANEL
BUN: 8 mg/dL (ref 8–27)
Calcium: 8.6 mg/dL (ref 8.6–10.2)
Chloride: 95 mmol/L — ABNORMAL LOW (ref 97–108)
GFR calc Af Amer: 90 mL/min/{1.73_m2} (ref >59)
Glucose: 106 mg/dL — ABNORMAL HIGH (ref 65–99)
Potassium: 4.7 mmol/L (ref 3.5–5.2)

## 2012-10-10 LAB — CBC WITH DIFFERENTIAL
Basophils Absolute: 0 10*3/uL (ref 0.0–0.2)
Eos: 3 % (ref 0–5)
Immature Grans (Abs): 0 10*3/uL (ref 0.0–0.1)
Immature Granulocytes: 0 % (ref 0–2)
Lymphs: 31 % (ref 14–46)
Monocytes: 10 % (ref 4–12)
Neutrophils Relative %: 55 % (ref 40–74)
Platelets: 242 10*3/uL (ref 155–379)
RBC: 5.1 x10E6/uL (ref 4.14–5.80)
RDW: 14.7 % (ref 12.3–15.4)
WBC: 5.6 10*3/uL (ref 3.4–10.8)

## 2012-10-10 LAB — PROTIME-INR
INR: 3 — ABNORMAL HIGH (ref 0.8–1.2)
Prothrombin Time: 31.6 s — ABNORMAL HIGH (ref 9.1–12.0)

## 2012-10-13 ENCOUNTER — Other Ambulatory Visit: Payer: Medicare Other

## 2012-10-20 ENCOUNTER — Ambulatory Visit: Payer: Self-pay | Admitting: Cardiovascular Disease

## 2012-10-20 DIAGNOSIS — I4891 Unspecified atrial fibrillation: Secondary | ICD-10-CM

## 2012-10-20 LAB — PROTIME-INR: Prothrombin Time: 29 secs — ABNORMAL HIGH (ref 11.5–14.7)

## 2012-10-20 LAB — LAB REPORT - SCANNED: INR: 2.7

## 2012-11-01 ENCOUNTER — Other Ambulatory Visit: Payer: Medicare Other

## 2012-11-06 ENCOUNTER — Encounter: Payer: Medicare Other | Admitting: Family Medicine

## 2012-11-09 ENCOUNTER — Ambulatory Visit (INDEPENDENT_AMBULATORY_CARE_PROVIDER_SITE_OTHER): Payer: Medicare Other | Admitting: Cardiovascular Disease

## 2012-11-09 ENCOUNTER — Encounter: Payer: Self-pay | Admitting: Cardiovascular Disease

## 2012-11-09 VITALS — BP 138/70 | HR 79 | Ht 71.0 in | Wt 182.0 lb

## 2012-11-09 DIAGNOSIS — Z9889 Other specified postprocedural states: Secondary | ICD-10-CM

## 2012-11-09 DIAGNOSIS — I420 Dilated cardiomyopathy: Secondary | ICD-10-CM

## 2012-11-09 DIAGNOSIS — I4891 Unspecified atrial fibrillation: Secondary | ICD-10-CM

## 2012-11-09 DIAGNOSIS — I428 Other cardiomyopathies: Secondary | ICD-10-CM

## 2012-11-09 MED ORDER — AMIODARONE HCL 200 MG PO TABS: 200.0000 mg | ORAL_TABLET | Freq: Every day | ORAL | Status: DC

## 2012-11-09 MED ORDER — CARVEDILOL 12.5 MG PO TABS: 12.5000 mg | ORAL_TABLET | Freq: Two times a day (BID) | ORAL | Status: DC

## 2012-11-09 MED ORDER — EPLERENONE 25 MG PO TABS: 25.0000 mg | ORAL_TABLET | Freq: Every day | ORAL | Status: DC

## 2012-11-09 NOTE — Patient Instructions (Addendum)
Decrease Amiodarone to 200 mg once daily.  Stop Potassium Increase Carvedilol to 12.5 mg twice daily.  Start Eplerenone 25 mg once daily.  Check labs in 1 week.  Follow up in 3 month.

## 2012-11-09 NOTE — Assessment & Plan Note (Signed)
He seems to be stable.

## 2012-11-09 NOTE — Assessment & Plan Note (Signed)
He is maintaining in sinus rhythm after recent initiation of amiodarone and cardioversion. I will decrease amiodarone to 200 mg once daily. He will need liver and thyroid panel in few months. Continue anticoagulation with warfarin.

## 2012-11-09 NOTE — Progress Notes (Signed)
HPI  This is a pleasant 79 year old male who is here today for a followup visit after a recent cardioversion. He has a h/o persistent atrial fibrillation and nonischemic cardiomyopathy . He reports that he has had afib since the 1990s.  His afib is associated with worsening CHF. He had multiple cardioversions in the past. He has known history of mitral valve disease status post mitral valve repair in the 90s. He has no history of coronary artery disease. He is on coumadin for stroke prevention. He had recurrent atrial fibrillation recently. I decided to start him on amiodarone and then proceeded with cardioversion. He has been in sinus rhythm since then. He is doing well and denies any chest pain, dyspnea or palpitations. Digoxin was stopped during last visit as well.  Allergies  Allergen Reactions  . Atorvastatin Other (See Comments)    REACTION:muscular soreness     Current Outpatient Prescriptions on File Prior to Visit  Medication Sig Dispense Refill  . amiodarone (PACERONE) 200 MG tablet Take 1 tablet (200 mg total) by mouth daily.  60 tablet  3  . Coenzyme Q10 (COQ10) 100 MG CAPS Take 1 tablet by mouth daily.       . cyanocobalamin 100 MCG tablet Take 100 mcg by mouth daily.        . enalapril (VASOTEC) 5 MG tablet Take 5 mg by mouth 2 (two) times daily.      . finasteride (PROSCAR) 5 MG tablet Take 5 mg by mouth daily.      . fish oil-omega-3 fatty acids 1000 MG capsule Take 1 g by mouth daily.      . furosemide (LASIX) 40 MG tablet Take 40 mg by mouth as needed.      . hydroxypropyl methylcellulose (ISOPTO TEARS) 2.5 % ophthalmic solution Place 1 drop into both eyes every morning.      . Multiple Vitamin (MULTIVITAMIN) tablet Take 1 tablet by mouth daily.        Marland Kitchen omeprazole (PRILOSEC) 20 MG capsule TAKE ONE CAPSULE BY MOUTH EVERY DAY  90 capsule  2  . Red Yeast Rice 600 MG CAPS Take 600 mg by mouth 2 (two) times daily.       . Tamsulosin HCl (FLOMAX) 0.4 MG CAPS TAKE ONE  CAPSULE BY MOUTH EVERY DAY  30 capsule  11  . warfarin (COUMADIN) 4 MG tablet Take 1 tablet (4 mg total) by mouth daily.  30 tablet  3  . eplerenone (INSPRA) 25 MG tablet Take 1 tablet (25 mg total) by mouth daily.  30 tablet  6     Past Medical History  Diagnosis Date  . Coronary atherosclerosis of unspecified type of vessel, native or graft 2008    cardioversion. Echo mod LVH EF 25%, Triv AR  Mild LAE, RAE 11/15/*11  . Long term (current) use of anticoagulants   . Atrial fibrillation   . Pure hypercholesterolemia   . Esophageal reflux   . Sprain of neck   . Dizziness and giddiness   . Iron deficiency anemia, unspecified   . Unspecified sleep apnea   . Left heart failure   . Personal history of other diseases of digestive system   . Diaphragmatic hernia without mention of obstruction or gangrene   . Unspecified hemorrhoids without mention of complication   . Diverticulosis of colon (without mention of hemorrhage)   . Encounter for long-term (current) use of other medications   . Encounter for therapeutic drug monitoring   .  Personal history of peptic ulcer disease   . Gastritis 12/18/08    EGD, 3 clips remaining (Dr. Mechele Collin)  . Upper GI bleed 7/15-7/20/09    Hosp.  Elevated INR, coumadin stopped sinusitis  . Hypertension   . CHF (congestive heart failure)      Past Surgical History  Procedure Date  . Mitral valve repair 1995  . Cholecystectomy   . Partial gastrectomy   . Appendectomy   . Cataract extraction 10/13/08    left  . Mitral valve annuloplasty 1996  . Cardioversion 12/17/2011    Procedure: CARDIOVERSION;  Surgeon: Herby Abraham, MD;  Location: [facility] OR;  Service: Cardiovascular;  Laterality: N/A;  . Cardiac catheterization 1996    [facility]  . Cardiac catheterization   . Cardiac catheterization      Family History  Problem Relation Age of Onset  . Cancer Father     lung     History   Social History  . Marital Status: Married    Spouse Name: N/A     Number of Children: 3  . 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 Smoking status: Never Smoker   . Smokeless tobacco: Never Used  . Alcohol Use: Yes     Comment: socially, beer   . Drug Use: No  . Sexually Active: Not on file   Other Topics Concern  . Not on file   Social History Narrative   Daily caffeine use.     PHYSICAL EXAM   BP 138/70  Pulse 79  Ht 5\' 11"  (1.803 m)  Wt 182 lb (82.555 kg)  BMI 25.38 kg/m2 Constitutional: He is oriented to person, place, and time. He appears well-developed and well-nourished. No distress.  HENT: No nasal discharge.  Head: Normocephalic and atraumatic.  Eyes: Pupils are equal and round. Right eye exhibits no discharge. Left eye exhibits no discharge.  Neck: Normal range of motion. Neck supple. No JVD present. No thyromegaly present.  Cardiovascular: Normal rate, irregular rhythm, normal heart sounds and. Exam reveals no gallop and no friction rub. No murmur heard.  Pulmonary/Chest: Effort normal and breath sounds normal. No stridor. No respiratory distress. He has no wheezes. He has no rales. He exhibits no tenderness.  Abdominal: Soft. Bowel sounds are normal. He exhibits no distension. There is no tenderness. There is no rebound and no guarding.  Musculoskeletal: Normal range of motion. He exhibits no edema and no tenderness.  Neurological: He is alert and oriented to person, place, and time. Coordination normal.  Skin: Skin is warm and dry. No rash noted. He is not diaphoretic. No erythema. No pallor.  Psychiatric: He has a normal mood and affect. His behavior is normal. Judgment and thought content normal.       EKG: Sinus  Rhythm  -First degree A-V block  PRi = 242 IVCD  ABNORMAL    ASSESSMENT AND PLAN

## 2012-11-09 NOTE — Assessment & Plan Note (Signed)
He appears to be euvolemic and he uses Lasix only as needed. Increase carvedilol to 12.5 mg twice daily. I also added Eplerenone 25 mg once daily. Check basic metabolic profile in one week. I recommend a followup echocardiogram in 3 months from now to evaluate the ejection fraction and see if an ICD is indicated.

## 2012-11-15 ENCOUNTER — Other Ambulatory Visit: Payer: Medicare Other

## 2012-11-16 ENCOUNTER — Other Ambulatory Visit: Payer: Medicare Other

## 2012-11-20 ENCOUNTER — Ambulatory Visit (INDEPENDENT_AMBULATORY_CARE_PROVIDER_SITE_OTHER): Payer: Medicare Other

## 2012-11-20 DIAGNOSIS — I4891 Unspecified atrial fibrillation: Secondary | ICD-10-CM

## 2012-11-21 LAB — BASIC METABOLIC PANEL
BUN/Creatinine Ratio: 9 — ABNORMAL LOW (ref 10–22)
BUN: 8 mg/dL (ref 8–27)
CO2: 26 mmol/L (ref 19–28)
Creatinine, Ser: 0.9 mg/dL (ref 0.76–1.27)
GFR calc Af Amer: 95 mL/min/{1.73_m2} (ref >59)

## 2012-11-28 ENCOUNTER — Other Ambulatory Visit: Payer: Self-pay | Admitting: Family Medicine

## 2012-11-28 LAB — PROTIME-INR: Prothrombin Time: 28.4 s — ABNORMAL HIGH (ref 9.1–12.0)

## 2012-12-07 ENCOUNTER — Other Ambulatory Visit: Payer: Self-pay | Admitting: *Deleted

## 2012-12-07 MED ORDER — FINASTERIDE 5 MG PO TABS: 5.0000 mg | ORAL_TABLET | Freq: Every day | ORAL | Status: DC

## 2012-12-11 ENCOUNTER — Telehealth: Payer: Self-pay | Admitting: Family Medicine

## 2012-12-11 NOTE — Telephone Encounter (Signed)
Patient advised.  He thinks he has a standing order at [facility].  If he finds that he needs that updated, he will let us know.

## 2012-12-11 NOTE — Telephone Encounter (Signed)
[facility] send order [facility] to have his INR checked there and faxed here, to me.  Dx A fib.  He'll be due for repeat INR 4 weeks from the last value, ie in the last week of March.  Thanks.

## 2012-12-11 NOTE — Telephone Encounter (Signed)
Message copied by Joaquim Nam on Mon Dec 11, 2012  9:48 AM ------      Message from: Lodema Pilot D      Created: Mon Dec 11, 2012  9:37 AM       Patient refuses to come to clinic due to the co-pay and wants to continue to go to [facility].  I told pt that I would let you know his preference.  Thanks!  Arline Asp      ----- Message -----         From: Joaquim Nam, MD         Sent: 12/07/2012   8:34 AM           To: Garrison Columbus, RN            Please call him about having his INR followed here at [facility].  Thanks.       ------

## 2012-12-25 ENCOUNTER — Other Ambulatory Visit: Payer: Self-pay | Admitting: Family Medicine

## 2012-12-25 LAB — PROTIME-INR: INR: 2.3 — ABNORMAL HIGH (ref 0.8–1.2)

## 2013-01-16 ENCOUNTER — Other Ambulatory Visit: Payer: Self-pay | Admitting: Cardiovascular Disease

## 2013-01-16 NOTE — Telephone Encounter (Signed)
Sent!

## 2013-01-17 ENCOUNTER — Other Ambulatory Visit: Payer: Self-pay | Admitting: *Deleted

## 2013-01-17 MED ORDER — CARVEDILOL 12.5 MG PO TABS: 12.5000 mg | ORAL_TABLET | Freq: Two times a day (BID) | ORAL | Status: DC

## 2013-01-22 ENCOUNTER — Other Ambulatory Visit: Payer: Self-pay | Admitting: Family Medicine

## 2013-01-22 LAB — PROTIME-INR
INR: 3.3 — ABNORMAL HIGH (ref 0.8–1.2)
Prothrombin Time: 34.5 s — ABNORMAL HIGH (ref 9.1–12.0)

## 2013-02-06 ENCOUNTER — Other Ambulatory Visit: Payer: Self-pay | Admitting: *Deleted

## 2013-02-07 ENCOUNTER — Other Ambulatory Visit: Payer: Self-pay

## 2013-02-12 ENCOUNTER — Ambulatory Visit (INDEPENDENT_AMBULATORY_CARE_PROVIDER_SITE_OTHER): Payer: Medicare Other | Admitting: Cardiovascular Disease

## 2013-02-12 ENCOUNTER — Encounter: Payer: Self-pay | Admitting: Cardiovascular Disease

## 2013-02-12 VITALS — BP 140/70 | HR 69 | Ht 71.0 in | Wt 186.5 lb

## 2013-02-12 DIAGNOSIS — I420 Dilated cardiomyopathy: Secondary | ICD-10-CM

## 2013-02-12 DIAGNOSIS — Z79899 Other long term (current) drug therapy: Secondary | ICD-10-CM

## 2013-02-12 DIAGNOSIS — I4891 Unspecified atrial fibrillation: Secondary | ICD-10-CM

## 2013-02-12 DIAGNOSIS — I428 Other cardiomyopathies: Secondary | ICD-10-CM

## 2013-02-12 MED ORDER — ENALAPRIL MALEATE 5 MG PO TABS: 5.0000 mg | ORAL_TABLET | Freq: Two times a day (BID) | ORAL | Status: DC

## 2013-02-12 NOTE — Assessment & Plan Note (Signed)
He is maintaining in sinus rhythm with amiodarone 200 mg once daily. Continue long-term anticoagulation with warfarin. I will check liver and thyroid panel today given that he was started on amiodarone.

## 2013-02-12 NOTE — Patient Instructions (Addendum)
Labs today.  Continue same medications.  Follow up in 6 months.  

## 2013-02-12 NOTE — Progress Notes (Signed)
HPI  This is a pleasant 79 year old male who is here today for a follow up visit. He has a h/o persistent atrial fibrillation and nonischemic cardiomyopathy.   His afib has been associated with worsening CHF in the past. He had multiple cardioversions. He has known history of mitral valve disease status post mitral valve repair in the 90s. He has no history of coronary artery disease. He is on coumadin for stroke prevention. He was placed on amiodarone for recurrent atrial fibrillation which required cardioversion. He has been maintaining in sinus rhythm since then. His heart failure medications were titrated and Eplerenon was added. He has been doing very well and denies any chest pain, dyspnea or palpitations.  Allergies  Allergen Reactions  . Atorvastatin Other (See Comments)    REACTION:muscular soreness     Current Outpatient Prescriptions on File Prior to Visit  Medication Sig Dispense Refill  . amiodarone (PACERONE) 200 MG tablet Take 1 tablet (200 mg total) by mouth daily.  60 tablet  3  . carvedilol (COREG) 12.5 MG tablet Take 1 tablet (12.5 mg total) by mouth 2 (two) times daily.  60 tablet  6  . Coenzyme Q10 (COQ10) 100 MG CAPS Take 1 tablet by mouth daily.       . cyanocobalamin 100 MCG tablet Take 100 mcg by mouth daily.        Marland Kitchen eplerenone (INSPRA) 25 MG tablet Take 1 tablet (25 mg total) by mouth daily.  30 tablet  6  . finasteride (PROSCAR) 5 MG tablet Take 1 tablet (5 mg total) by mouth daily. * Needs to schedule a yearly physical and labs with Dr for any additional refills*  30 tablet  1  . fish oil-omega-3 fatty acids 1000 MG capsule Take 1 g by mouth daily.      . furosemide (LASIX) 40 MG tablet Take 40 mg by mouth as needed.      . hydroxypropyl methylcellulose (ISOPTO TEARS) 2.5 % ophthalmic solution Place 1 drop into both eyes every morning.      . Multiple Vitamin (MULTIVITAMIN) tablet Take 1 tablet by mouth daily.        Marland Kitchen omeprazole (PRILOSEC) 20 MG capsule TAKE  ONE CAPSULE BY MOUTH EVERY DAY  90 capsule  2  . Red Yeast Rice 600 MG CAPS Take 600 mg by mouth 2 (two) times daily.       . Tamsulosin HCl (FLOMAX) 0.4 MG CAPS TAKE ONE CAPSULE BY MOUTH EVERY DAY  30 capsule  11  . warfarin (COUMADIN) 4 MG tablet TAKE ONE TABLET BY MOUTH EVERY DAY OR AS DIRECTED  30 tablet  12   No current facility-administered medications on file prior to visit.     Past Medical History  Diagnosis Date  . Coronary atherosclerosis of unspecified type of vessel, native or graft 2008    cardioversion. Echo mod LVH EF 25%, Triv AR  Mild LAE, RAE 11/15/*11  . Long term (current) use of anticoagulants   . Atrial fibrillation   . Pure hypercholesterolemia   . Esophageal reflux   . Sprain of neck   . Dizziness and giddiness   . Iron deficiency anemia, unspecified   . Unspecified sleep apnea   . Left heart failure   . Personal history of other diseases of digestive system   . Diaphragmatic hernia without mention of obstruction or gangrene   . Unspecified hemorrhoids without mention of complication   . Diverticulosis of colon (without mention of hemorrhage)   .  Encounter for long-term (current) use of other medications   . Encounter for therapeutic drug monitoring   . Personal history of peptic ulcer disease   . Gastritis 12/18/08    EGD, 3 clips remaining (Dr. Mechele Collin)  . Upper GI bleed 7/15-7/20/09    Hosp.  Elevated INR, coumadin stopped sinusitis  . Hypertension   . CHF (congestive heart failure)      Past Surgical History  Procedure Laterality Date  . Mitral valve repair  1995  . Cholecystectomy    . Partial gastrectomy    . Appendectomy    . Cataract extraction  10/13/08    left  . Mitral valve annuloplasty  1996  . Cardioversion  12/17/2011    Procedure: CARDIOVERSION;  Surgeon: Herby Abraham, MD;  Location: [facility] OR;  Service: Cardiovascular;  Laterality: N/A;  . Cardiac catheterization  1996    [facility]  . Cardiac catheterization    . Cardiac  catheterization       Family History  Problem Relation Age of Onset  . Cancer Father     lung     History   Social History  . Marital Status: Married    Spouse Name: N/A    Number of Children: 3  . 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 Smoking status: Never Smoker   . Smokeless tobacco: Never Used  . Alcohol Use: Yes     Comment: socially, beer   . Drug Use: No  . Sexually Active: Not on file   Other Topics Concern  . Not on file   Social History Narrative   Daily caffeine use.     PHYSICAL EXAM   BP 140/70  Pulse 69  Ht 5\' 11"  (1.803 m)  Wt 186 lb 8 oz (84.596 kg)  BMI 26.02 kg/m2 Constitutional: He is oriented to person, place, and time. He appears well-developed and well-nourished. No distress.  HENT: No nasal discharge.  Head: Normocephalic and atraumatic.  Eyes: Pupils are equal and round. Right eye exhibits no discharge. Left eye exhibits no discharge.  Neck: Normal range of motion. Neck supple. No JVD present. No thyromegaly present.  Cardiovascular: Normal rate, irregular rhythm, normal heart sounds and. Exam reveals no gallop and no friction rub. No murmur heard.  Pulmonary/Chest: Effort normal and breath sounds normal. No stridor. No respiratory distress. He has no wheezes. He has no rales. He exhibits no tenderness.  Abdominal: Soft. Bowel sounds are normal. He exhibits no distension. There is no tenderness. There is no rebound and no guarding.  Musculoskeletal: Normal range of motion. He exhibits no edema and no tenderness.  Neurological: He is alert and oriented to person, place, and time. Coordination normal.  Skin: Skin is warm and dry. No rash noted. He is not diaphoretic. No erythema. No pallor.  Psychiatric: He has a normal mood and affect. His behavior is normal. Judgment and thought content normal.       EKG: Sinus  Rhythm  Low voltage in limb leads.    -Nonspecific IVCD.   -  Diffuse nonspecific T-abnormality.   ABNORMAL    ASSESSMENT AND PLAN

## 2013-02-12 NOTE — Assessment & Plan Note (Addendum)
He is doing very well on current medications and currently New [place] Heart Association class II. I discussed with him the possibility of an ICD but he is not interested. Thus, there is no need to repeat an echocardiogram at this time given that he is symptomatically better. I will check basic metabolic profile today

## 2013-02-12 NOTE — Addendum Note (Signed)
Addended by: Festus Aloe on: 02/12/2013 11:00 AM   Modules accepted: Orders

## 2013-02-15 ENCOUNTER — Other Ambulatory Visit: Payer: Self-pay | Admitting: Family Medicine

## 2013-02-16 ENCOUNTER — Other Ambulatory Visit: Payer: Self-pay

## 2013-02-16 DIAGNOSIS — I4891 Unspecified atrial fibrillation: Secondary | ICD-10-CM

## 2013-02-16 LAB — HEPATIC FUNCTION PANEL
ALT: 28 IU/L (ref 0–44)
AST: 26 IU/L (ref 0–40)
Albumin: 4.2 g/dL (ref 3.5–4.8)
Bilirubin, Direct: 0.15 mg/dL (ref 0.00–0.40)
Total Bilirubin: 0.5 mg/dL (ref 0.0–1.2)

## 2013-02-16 NOTE — Progress Notes (Signed)
lmtcb regarding lab result; TSH result will be faxed to [place] today.

## 2013-02-19 NOTE — Progress Notes (Signed)
lmtcb

## 2013-02-19 NOTE — Progress Notes (Signed)
Pt informed of lab results. 

## 2013-02-19 NOTE — Progress Notes (Signed)
Pt informed of lab result

## 2013-03-22 ENCOUNTER — Other Ambulatory Visit: Payer: Self-pay | Admitting: Family Medicine

## 2013-03-22 ENCOUNTER — Other Ambulatory Visit: Payer: Self-pay

## 2013-03-22 LAB — PROTIME-INR
INR: 2.4 — ABNORMAL HIGH (ref 0.8–1.2)
Prothrombin Time: 25.7 s — ABNORMAL HIGH (ref 9.1–12.0)

## 2013-03-22 MED ORDER — AMIODARONE HCL 200 MG PO TABS: 200.0000 mg | ORAL_TABLET | Freq: Every day | ORAL | Status: DC

## 2013-03-22 NOTE — Telephone Encounter (Signed)
Refill sent for amiodarone 200 mg take one tablet daily. 

## 2013-03-23 ENCOUNTER — Other Ambulatory Visit: Payer: Self-pay | Admitting: Cardiology

## 2013-04-26 ENCOUNTER — Other Ambulatory Visit: Payer: Self-pay | Admitting: Family Medicine

## 2013-04-27 ENCOUNTER — Telehealth: Payer: Self-pay | Admitting: Family Medicine

## 2013-04-27 ENCOUNTER — Ambulatory Visit (INDEPENDENT_AMBULATORY_CARE_PROVIDER_SITE_OTHER): Payer: Medicare Other | Admitting: Family Medicine

## 2013-04-27 LAB — PROTIME-INR: INR: 2.6 — ABNORMAL HIGH (ref 0.8–1.2)

## 2013-04-27 NOTE — Telephone Encounter (Signed)
Error

## 2013-05-21 ENCOUNTER — Other Ambulatory Visit: Payer: Self-pay | Admitting: Family Medicine

## 2013-06-18 ENCOUNTER — Other Ambulatory Visit: Payer: Self-pay | Admitting: Cardiovascular Disease

## 2013-06-28 ENCOUNTER — Other Ambulatory Visit: Payer: Self-pay | Admitting: Family Medicine

## 2013-06-28 LAB — PROTIME-INR
INR: 2.6 — ABNORMAL HIGH (ref 0.8–1.2)
Prothrombin Time: 27.2 s — ABNORMAL HIGH (ref 9.1–12.0)

## 2013-06-29 ENCOUNTER — Ambulatory Visit (INDEPENDENT_AMBULATORY_CARE_PROVIDER_SITE_OTHER): Payer: Medicare Other | Admitting: Family Medicine

## 2013-07-05 ENCOUNTER — Other Ambulatory Visit: Payer: Self-pay

## 2013-07-05 MED ORDER — FUROSEMIDE 40 MG PO TABS: 40.0000 mg | ORAL_TABLET | ORAL | Status: DC | PRN

## 2013-07-05 NOTE — Telephone Encounter (Signed)
Refilled furosemide sent to [facility] pharmacy.

## 2013-07-26 ENCOUNTER — Other Ambulatory Visit: Payer: Self-pay | Admitting: Family Medicine

## 2013-07-26 LAB — PROTIME-INR
INR: 2.9 — ABNORMAL HIGH (ref 0.8–1.2)
Prothrombin Time: 30.6 s — ABNORMAL HIGH (ref 9.1–12.0)

## 2013-07-30 ENCOUNTER — Encounter: Payer: Self-pay | Admitting: Family Medicine

## 2013-07-30 LAB — INR (THROMBOREL-S)
INR: 2.9
PT: 30.6

## 2013-08-10 ENCOUNTER — Encounter: Payer: Self-pay | Admitting: Family Medicine

## 2013-08-16 ENCOUNTER — Encounter: Payer: Self-pay | Admitting: Cardiovascular Disease

## 2013-08-16 ENCOUNTER — Ambulatory Visit (INDEPENDENT_AMBULATORY_CARE_PROVIDER_SITE_OTHER): Payer: Medicare Other | Admitting: Cardiovascular Disease

## 2013-08-16 VITALS — BP 138/88 | HR 77 | Ht 71.0 in | Wt 187.8 lb

## 2013-08-16 DIAGNOSIS — I10 Essential (primary) hypertension: Secondary | ICD-10-CM

## 2013-08-16 DIAGNOSIS — I5022 Chronic systolic (congestive) heart failure: Secondary | ICD-10-CM

## 2013-08-16 DIAGNOSIS — I4891 Unspecified atrial fibrillation: Secondary | ICD-10-CM

## 2013-08-16 NOTE — Assessment & Plan Note (Signed)
He is maintaining in NSR on Amiodarone. Continue same dose. Check TSH and LFTs in 6  Months.

## 2013-08-16 NOTE — Assessment & Plan Note (Signed)
He appears to be euvolemic. He is in NYHA class 2 on optimal medical therapy.  He is not interested in ICD. I suspect that his EF improved.

## 2013-08-16 NOTE — Assessment & Plan Note (Signed)
BP is well controlled. Check BMP today given that he is on Eplerenone.

## 2013-08-16 NOTE — Progress Notes (Signed)
HPI  This is a pleasant 79 year old male who is here today for a follow up visit. He has a h/o persistent atrial fibrillation and nonischemic cardiomyopathy.   His afib has been associated with worsening CHF in the past. He had multiple cardioversions. He has known history of mitral valve disease status post mitral valve repair in the 90s. He has no history of coronary artery disease. He is on coumadin for stroke prevention. He was placed on amiodarone for recurrent atrial fibrillation which required cardioversion. He has been maintaining in sinus rhythm since then. His heart failure medications were titrated and Eplerenon was added. He has been doing very well and denies any chest pain, dyspnea or palpitations. He complains of mild dizziness especially when standing suddenly. No syncope or presyncope.   Allergies  Allergen Reactions  . Atorvastatin Other (See Comments)    REACTION:muscular soreness     Current Outpatient Prescriptions on File Prior to Visit  Medication Sig Dispense Refill  . amiodarone (PACERONE) 200 MG tablet Take 1 tablet (200 mg total) by mouth daily.  30 tablet  6  . carvedilol (COREG) 12.5 MG tablet Take 1 tablet (12.5 mg total) by mouth 2 (two) times daily.  60 tablet  6  . Coenzyme Q10 (COQ10) 100 MG CAPS Take 1 tablet by mouth daily.       . cyanocobalamin 100 MCG tablet Take 100 mcg by mouth daily.        . enalapril (VASOTEC) 5 MG tablet Take 1 tablet (5 mg total) by mouth 2 (two) times daily.  60 tablet  6  . eplerenone (INSPRA) 25 MG tablet TAKE ONE TABLET BY MOUTH EVERY DAY  90 tablet  1  . finasteride (PROSCAR) 5 MG tablet TAKE ONE TABLET BY MOUTH ONCE DAILY  30 tablet  6  . fish oil-omega-3 fatty acids 1000 MG capsule Take 1 g by mouth daily.      . furosemide (LASIX) 40 MG tablet Take 1 tablet (40 mg total) by mouth as needed.  30 tablet  3  . hydroxypropyl methylcellulose (ISOPTO TEARS) 2.5 % ophthalmic solution Place 1 drop into both eyes every  morning.      . Multiple Vitamin (MULTIVITAMIN) tablet Take 1 tablet by mouth daily.        Marland Kitchen omeprazole (PRILOSEC) 20 MG capsule TAKE ONE CAPSULE BY MOUTH EVERY DAY  90 capsule  3  . Red Yeast Rice 600 MG CAPS Take 600 mg by mouth 2 (two) times daily.       . Tamsulosin HCl (FLOMAX) 0.4 MG CAPS TAKE ONE CAPSULE BY MOUTH EVERY DAY  30 capsule  11  . warfarin (COUMADIN) 4 MG tablet TAKE ONE TABLET BY MOUTH EVERY DAY OR AS DIRECTED  30 tablet  12   No current facility-administered medications on file prior to visit.     Past Medical History  Diagnosis Date  . Coronary atherosclerosis of unspecified type of vessel, native or graft 2008    cardioversion. Echo mod LVH EF 25%, Triv AR  Mild LAE, RAE 11/15/*11  . Long term (current) use of anticoagulants   . Atrial fibrillation   . Pure hypercholesterolemia   . Esophageal reflux   . Sprain of neck   . Dizziness and giddiness   . Iron deficiency anemia, unspecified   . Unspecified sleep apnea   . Left heart failure   . Personal history of other diseases of digestive system   . Diaphragmatic hernia without mention  of obstruction or gangrene   . Unspecified hemorrhoids without mention of complication   . Diverticulosis of colon (without mention of hemorrhage)   . Encounter for long-term (current) use of other medications   . Encounter for therapeutic drug monitoring   . Personal history of peptic ulcer disease   . Gastritis 12/18/08    EGD, 3 clips remaining (Dr. Mechele Collin)  . Upper GI bleed 7/15-7/20/09    Hosp.  Elevated INR, coumadin stopped sinusitis  . Hypertension   . CHF (congestive heart failure)      Past Surgical History  Procedure Laterality Date  . Mitral valve repair  1995  . Cholecystectomy    . Partial gastrectomy    . Appendectomy    . Cataract extraction  10/13/08    left  . Mitral valve annuloplasty  1996  . Cardioversion  12/17/2011    Procedure: CARDIOVERSION;  Surgeon: Herby Abraham, MD;  Location: [facility] OR;   Service: Cardiovascular;  Laterality: N/A;  . Cardiac catheterization  1996    [facility]  . Cardiac catheterization    . Cardiac catheterization       Family History  Problem Relation Age of Onset  . Cancer Father     lung     History   Social History  . Marital Status: Married    Spouse Name: N/A    Number of Children: 3  . 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 Smoking status: Never Smoker   . Smokeless tobacco: Never Used  . Alcohol Use: Yes     Comment: socially, beer   . Drug Use: No  . Sexual Activity: Not on file   Other Topics Concern  . Not on file   Social History Narrative   Daily caffeine use.     PHYSICAL EXAM   BP 138/88  Pulse 77  Ht 5\' 11"  (1.803 m)  Wt 187 lb 12 oz (85.163 kg)  BMI 26.20 kg/m2 Constitutional: He is oriented to person, place, and time. He appears well-developed and well-nourished. No distress.  HENT: No nasal discharge.  Head: Normocephalic and atraumatic.  Eyes: Pupils are equal and round. Right eye exhibits no discharge. Left eye exhibits no discharge.  Neck: Normal range of motion. Neck supple. No JVD present. No thyromegaly present.  Cardiovascular: Normal rate, irregular rhythm, normal heart sounds and. Exam reveals no gallop and no friction rub. No murmur heard.  Pulmonary/Chest: Effort normal and breath sounds normal. No stridor. No respiratory distress. He has no wheezes. He has no rales. He exhibits no tenderness.  Abdominal: Soft. Bowel sounds are normal. He exhibits no distension. There is no tenderness. There is no rebound and no guarding.  Musculoskeletal: Normal range of motion. He exhibits no edema and no tenderness.  Neurological: He is alert and oriented to person, place, and time. Coordination normal.  Skin: Skin is warm and dry. No rash noted. He is not diaphoretic. No erythema. No pallor.  Psychiatric: He has a normal mood and affect. His  behavior is normal. Judgment and thought content normal.       EKG: Sinus  Rhythm  Low voltage in limb leads.   -Nonspecific IVCD.   -  Diffuse nonspecific T-abnormality.   ABNORMAL    ASSESSMENT AND PLAN

## 2013-08-16 NOTE — Patient Instructions (Signed)
Labs today.   Continue same medications.   Your physician wants you to follow-up in: 6 months.  You will receive a reminder letter in the mail two months in advance. If you don't receive a letter, please call our office to schedule the follow-up appointment.  

## 2013-08-17 ENCOUNTER — Telehealth: Payer: Self-pay

## 2013-08-17 LAB — BASIC METABOLIC PANEL
BUN/Creatinine Ratio: 9 — ABNORMAL LOW (ref 10–22)
CO2: 19 mmol/L (ref 18–29)
Calcium: 8.7 mg/dL (ref 8.6–10.2)
Potassium: 4.8 mmol/L (ref 3.5–5.2)
Sodium: 131 mmol/L — ABNORMAL LOW (ref 134–144)

## 2013-08-17 NOTE — Telephone Encounter (Signed)
Spoke w/ pt's wife.  She will relay message to pt and have him call with any questions or concerns.

## 2013-08-17 NOTE — Telephone Encounter (Signed)
Message copied by Marilynne Halsted on Fri Aug 17, 2013 11:33 AM ------      Message from: Lorine Bears A      Created: Fri Aug 17, 2013  9:06 AM       Inform patient that labs were normal. ------

## 2013-08-27 ENCOUNTER — Other Ambulatory Visit: Payer: Self-pay | Admitting: Family Medicine

## 2013-08-27 LAB — PROTIME-INR
INR: 3.5 — ABNORMAL HIGH (ref 0.8–1.2)
Prothrombin Time: 36.7 s — ABNORMAL HIGH (ref 9.1–12.0)

## 2013-08-29 ENCOUNTER — Ambulatory Visit (INDEPENDENT_AMBULATORY_CARE_PROVIDER_SITE_OTHER): Payer: Medicare Other | Admitting: Family Medicine

## 2013-09-14 ENCOUNTER — Telehealth: Payer: Self-pay

## 2013-09-14 MED ORDER — BENZONATATE 200 MG PO CAPS: 200.0000 mg | ORAL_CAPSULE | Freq: Three times a day (TID) | ORAL | Status: DC | PRN

## 2013-09-14 NOTE — Telephone Encounter (Signed)
Spoke to pt and informed him of instruction and that Rx was available for pick up. Pt verbalized understanding.

## 2013-09-14 NOTE — Telephone Encounter (Signed)
Pt took flu shot and after Thanksgiving pt began with head and chest congestion,prod cough with clear phlegm, no fever, no SOB or wheezing. Pt taking Tussin OTC but not helping. Request med for cough and congestion [facility] [place]. Advised if needs antibiotic would need to be seen. [name] said to see what Dr Para March can suggest without appt.Please advise.[name] request cb.

## 2013-09-14 NOTE — Telephone Encounter (Signed)
I would use nasal saline for the congestion and tessalon for the cough.  Tessalon sent.  Thanks.  Hopes he feels better soon.

## 2013-09-25 ENCOUNTER — Other Ambulatory Visit: Payer: Self-pay | Admitting: Family Medicine

## 2013-09-26 LAB — PROTIME-INR: Prothrombin Time: 45.9 s — ABNORMAL HIGH (ref 9.1–12.0)

## 2013-10-03 ENCOUNTER — Other Ambulatory Visit: Payer: Self-pay | Admitting: Family Medicine

## 2013-10-07 ENCOUNTER — Other Ambulatory Visit: Payer: Self-pay | Admitting: Family Medicine

## 2013-10-08 NOTE — Telephone Encounter (Signed)
Sent, please get him scheduled for a CPE.  Thanks.

## 2013-10-08 NOTE — Telephone Encounter (Signed)
Electronic refill request. Patient has not been seen in greater than 1 year with no upcoming appt scheduled.  According to refill protocol, please advise.  

## 2013-10-08 NOTE — Telephone Encounter (Signed)
Patient advised.

## 2013-10-21 ENCOUNTER — Other Ambulatory Visit: Payer: Self-pay | Admitting: Cardiovascular Disease

## 2013-10-25 ENCOUNTER — Other Ambulatory Visit: Payer: Self-pay | Admitting: Family Medicine

## 2013-10-26 ENCOUNTER — Other Ambulatory Visit: Payer: Self-pay | Admitting: Family Medicine

## 2013-10-26 ENCOUNTER — Ambulatory Visit (INDEPENDENT_AMBULATORY_CARE_PROVIDER_SITE_OTHER): Payer: Medicare Other | Admitting: Family Medicine

## 2013-10-26 LAB — PROTIME-INR
INR: 4 — ABNORMAL HIGH (ref 0.8–1.2)
PROTHROMBIN TIME: 41.7 s — AB (ref 9.1–12.0)

## 2013-10-31 ENCOUNTER — Other Ambulatory Visit: Payer: Self-pay | Admitting: Family Medicine

## 2013-10-31 NOTE — Telephone Encounter (Signed)
Received refill request electronically. Last office visit 11/06/12. Last refill message was sent that patient needs office visit for further refills. No appointment scheduled. Is it okay to refill medication?

## 2013-10-31 NOTE — Telephone Encounter (Signed)
Schedule CPE then send.  Thanks.

## 2013-11-01 NOTE — Telephone Encounter (Signed)
Of course- I didn't know all of that was going on.  I wish all of them the best.  Thanks.

## 2013-11-01 NOTE — Telephone Encounter (Signed)
Electronic refill request. Patient has not had an OV with you since 2013.  Please advise.

## 2013-11-01 NOTE — Telephone Encounter (Signed)
Patient says he has been seeing Dr. Fletcher Anon a lot with his heart and he has been seeing the MD in [facility] about his eyes and he found his son in the floor a few weeks ago and he was sent by ambulance to the hospital, having had a stroke and he has had to have surgery on his head.  He says he has been going to the hospital every day.  He says he will call to schedule a CPE within the next few days but asks if you could do his refills temporarily until he can get that scheduled.

## 2013-12-01 ENCOUNTER — Other Ambulatory Visit: Payer: Self-pay | Admitting: Cardiovascular Disease

## 2013-12-03 ENCOUNTER — Other Ambulatory Visit: Payer: Self-pay | Admitting: Family Medicine

## 2013-12-03 LAB — PROTIME-INR
INR: 3.8 — AB (ref 0.8–1.2)
Prothrombin Time: 40.2 s — ABNORMAL HIGH (ref 9.1–12.0)

## 2013-12-04 ENCOUNTER — Ambulatory Visit (INDEPENDENT_AMBULATORY_CARE_PROVIDER_SITE_OTHER): Payer: Medicare Other | Admitting: Family Medicine

## 2013-12-04 LAB — POCT INR: INR: 3.8

## 2013-12-17 ENCOUNTER — Ambulatory Visit: Payer: Medicare Other | Admitting: Cardiovascular Disease

## 2013-12-21 ENCOUNTER — Other Ambulatory Visit: Payer: Self-pay | Admitting: Cardiovascular Disease

## 2013-12-24 ENCOUNTER — Ambulatory Visit (INDEPENDENT_AMBULATORY_CARE_PROVIDER_SITE_OTHER): Payer: Medicare Other | Admitting: Cardiovascular Disease

## 2013-12-24 ENCOUNTER — Encounter: Payer: Self-pay | Admitting: Cardiovascular Disease

## 2013-12-24 VITALS — BP 82/52 | HR 76 | Ht 71.0 in | Wt 186.2 lb

## 2013-12-24 DIAGNOSIS — I5022 Chronic systolic (congestive) heart failure: Secondary | ICD-10-CM

## 2013-12-24 DIAGNOSIS — R0602 Shortness of breath: Secondary | ICD-10-CM

## 2013-12-24 DIAGNOSIS — I4891 Unspecified atrial fibrillation: Secondary | ICD-10-CM

## 2013-12-24 DIAGNOSIS — I10 Essential (primary) hypertension: Secondary | ICD-10-CM

## 2013-12-24 MED ORDER — ENALAPRIL MALEATE 5 MG PO TABS: 5.0000 mg | ORAL_TABLET | Freq: Every day | ORAL | Status: DC

## 2013-12-24 MED ORDER — CARVEDILOL 6.25 MG PO TABS: 6.2500 mg | ORAL_TABLET | Freq: Two times a day (BID) | ORAL | Status: DC

## 2013-12-24 NOTE — Assessment & Plan Note (Signed)
BP is low. Changes made as outlined above.

## 2013-12-24 NOTE — Assessment & Plan Note (Signed)
He continues to be in NSR with Amiodarone. Check thyroid and liver panel today.  Continue Warfarin.

## 2013-12-24 NOTE — Progress Notes (Signed)
HPI  This is a pleasant 80 year old male who is here today for a follow up visit. He has a h/o persistent atrial fibrillation and nonischemic cardiomyopathy.   His afib has been associated with worsening CHF in the past. He had multiple cardioversions. He has known history of mitral valve disease status post mitral valve repair in the 90s. He has no history of coronary artery disease. He is on coumadin for stroke prevention. He was placed on amiodarone for recurrent atrial fibrillation which required cardioversion. He has been maintaining in sinus rhythm since then. His heart failure medications were titrated and Eplerenon was added. He has been doing very well and denies any chest pain, dyspnea or palpitations. He has been having increased dizziness. He had a presyncopal episode 2 weeks ago.   Allergies  Allergen Reactions  . Atorvastatin Other (See Comments)    REACTION:muscular soreness     Current Outpatient Prescriptions on File Prior to Visit  Medication Sig Dispense Refill  . amiodarone (PACERONE) 200 MG tablet TAKE ONE TABLET BY MOUTH ONCE DAILY  30 tablet  2  . benzonatate (TESSALON) 200 MG capsule Take 1 capsule (200 mg total) by mouth 3 (three) times daily as needed for cough.  30 capsule  1  . Coenzyme Q10 (COQ10) 100 MG CAPS Take 1 tablet by mouth daily.       . cyanocobalamin 100 MCG tablet Take 100 mcg by mouth daily.        Marland Kitchen eplerenone (INSPRA) 25 MG tablet TAKE ONE TABLET BY MOUTH ONCE DAILY  90 tablet  3  . finasteride (PROSCAR) 5 MG tablet TAKE ONE TABLET BY MOUTH ONCE DAILY  30 tablet  3  . fish oil-omega-3 fatty acids 1000 MG capsule Take 1 g by mouth daily.      . furosemide (LASIX) 40 MG tablet Take 1 tablet (40 mg total) by mouth as needed.  30 tablet  3  . hydroxypropyl methylcellulose (ISOPTO TEARS) 2.5 % ophthalmic solution Place 1 drop into both eyes every morning.      . Multiple Vitamin (MULTIVITAMIN) tablet Take 1 tablet by mouth daily.        Marland Kitchen  omeprazole (PRILOSEC) 20 MG capsule TAKE ONE CAPSULE BY MOUTH EVERY DAY  90 capsule  3  . Red Yeast Rice 600 MG CAPS Take 600 mg by mouth 2 (two) times daily.       . Tamsulosin HCl (FLOMAX) 0.4 MG CAPS TAKE ONE CAPSULE BY MOUTH EVERY DAY  30 capsule  11  . warfarin (COUMADIN) 4 MG tablet TAKE ONE TABLET BY MOUTH EVERY DAY OR AS DIRECTED  30 tablet  12   No current facility-administered medications on file prior to visit.     Past Medical History  Diagnosis Date  . Coronary atherosclerosis of unspecified type of vessel, native or graft 2008    cardioversion. Echo mod LVH EF 25%, Triv AR  Mild LAE, RAE 11/15/*11  . Long term (current) use of anticoagulants   . Atrial fibrillation   . Pure hypercholesterolemia   . Esophageal reflux   . Sprain of neck   . Dizziness and giddiness   . Iron deficiency anemia, unspecified   . Unspecified sleep apnea   . Left heart failure   . Personal history of other diseases of digestive system   . Diaphragmatic hernia without mention of obstruction or gangrene   . Unspecified hemorrhoids without mention of complication   . Diverticulosis of colon (without mention  of hemorrhage)   . Encounter for long-term (current) use of other medications   . Encounter for therapeutic drug monitoring   . Personal history of peptic ulcer disease   . Gastritis 12/18/08    EGD, 3 clips remaining (Dr. Vira Agar)  . Upper GI bleed 7/15-7/20/09    Hosp.  Elevated INR, coumadin stopped sinusitis  . Hypertension   . CHF (congestive heart failure)      Past Surgical History  Procedure Laterality Date  . Mitral valve repair  1995  . Cholecystectomy    . Partial gastrectomy    . Appendectomy    . Cataract extraction  10/13/08    left  . Mitral valve annuloplasty  1996  . Cardioversion  12/17/2011    Procedure: CARDIOVERSION;  Surgeon: Hillary Bow, MD;  Location: [place];  Service: Cardiovascular;  Laterality: N/A;  . Cardiac catheterization  1996    [facility]  . Cardiac  catheterization    . Cardiac catheterization       Family History  Problem Relation Age of Onset  . Cancer Father     lung     History   Social History  . Marital Status: Married    Spouse Name: N/A    Number of Children: 3  . 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 Smoking status: Never Smoker   . Smokeless tobacco: Never Used  . Alcohol Use: Yes     Comment: socially, beer   . Drug Use: No  . Sexual Activity: Not on file   Other Topics Concern  . Not on file   Social History Narrative   Daily caffeine use.     PHYSICAL EXAM   BP 82/52  Pulse 76  Ht 5\' 11"  (1.803 m)  Wt 186 lb 4 oz (84.482 kg)  BMI 25.99 kg/m2 Constitutional: He is oriented to person, place, and time. He appears well-developed and well-nourished. No distress.  HENT: No nasal discharge.  Head: Normocephalic and atraumatic.  Eyes: Pupils are equal and round. Right eye exhibits no discharge. Left eye exhibits no discharge.  Neck: Normal range of motion. Neck supple. No JVD present. No thyromegaly present.  Cardiovascular: Normal rate, irregular rhythm, normal heart sounds and. Exam reveals no gallop and no friction rub. No murmur heard.  Pulmonary/Chest: Effort normal and breath sounds normal. No stridor. No respiratory distress. He has no wheezes. He has no rales. He exhibits no tenderness.  Abdominal: Soft. Bowel sounds are normal. He exhibits no distension. There is no tenderness. There is no rebound and no guarding.  Musculoskeletal: Normal range of motion. He exhibits no edema and no tenderness.  Neurological: He is alert and oriented to person, place, and time. Coordination normal.  Skin: Skin is warm and dry. No rash noted. He is not diaphoretic. No erythema. No pallor.  Psychiatric: He has a normal mood and affect. His behavior is normal. Judgment and thought content normal.       EKG: Sinus  Rhythm  Low  voltage in limb leads.   -Nonspecific IVCD.   -  Diffuse nonspecific T-abnormality.   ABNORMAL    ASSESSMENT AND PLAN

## 2013-12-24 NOTE — Patient Instructions (Signed)
Call us and let us know how often you are taking lasix   Your physician has recommended you make the following change in your medication:  Decrease Coreg to 6.25 mg twice daily  Decrease Enalapril 5 mg once daily   Your physician recommends that you have lab work today.  BMP  Liver Profile  TSH   Your physician has requested that you have an echocardiogram. Echocardiography is a painless test that uses sound waves to create images of your heart. It provides your doctor with information about the size and shape of your heart and how well your heart's chambers and valves are working. This procedure takes approximately one hour. There are no restrictions for this procedure.   Your physician recommends that you schedule a follow-up appointment in:  3 months

## 2013-12-24 NOTE — Assessment & Plan Note (Signed)
He is hypotensive and dizzy. He seems to be volume depleted. Check BMP today.  Increase fluid intake. Decrease Coreg to 6.25 mg bid and Enalapril to 5 mg once daily. Not sure if he is taking Lasix.  Check Echocardiogram to evaluate EF.

## 2013-12-25 LAB — BASIC METABOLIC PANEL
BUN/Creatinine Ratio: 10 (ref 10–22)
BUN: 11 mg/dL (ref 8–27)
CHLORIDE: 96 mmol/L — AB (ref 97–108)
CO2: 21 mmol/L (ref 18–29)
Calcium: 8.7 mg/dL (ref 8.6–10.2)
Creatinine, Ser: 1.05 mg/dL (ref 0.76–1.27)
GFR calc Af Amer: 78 mL/min/{1.73_m2} (ref >59)
GFR calc non Af Amer: 68 mL/min/{1.73_m2} (ref >59)
GLUCOSE: 76 mg/dL (ref 65–99)
POTASSIUM: 4.6 mmol/L (ref 3.5–5.2)
SODIUM: 135 mmol/L (ref 134–144)

## 2013-12-25 LAB — HEPATIC FUNCTION PANEL
ALT: 27 IU/L (ref 0–44)
AST: 27 IU/L (ref 0–40)
Albumin: 4.1 g/dL (ref 3.5–4.8)
Alkaline Phosphatase: 49 IU/L (ref 39–117)
Bilirubin, Direct: 0.11 mg/dL (ref 0.00–0.40)
Total Bilirubin: 0.3 mg/dL (ref 0.0–1.2)
Total Protein: 6.1 g/dL (ref 6.0–8.5)

## 2013-12-25 LAB — TSH: TSH: 0.819 u[IU]/mL (ref 0.450–4.500)

## 2013-12-31 ENCOUNTER — Other Ambulatory Visit (INDEPENDENT_AMBULATORY_CARE_PROVIDER_SITE_OTHER): Payer: Medicare Other

## 2013-12-31 ENCOUNTER — Other Ambulatory Visit: Payer: Self-pay

## 2013-12-31 DIAGNOSIS — I059 Rheumatic mitral valve disease, unspecified: Secondary | ICD-10-CM

## 2013-12-31 DIAGNOSIS — R0602 Shortness of breath: Secondary | ICD-10-CM

## 2013-12-31 DIAGNOSIS — I509 Heart failure, unspecified: Secondary | ICD-10-CM

## 2013-12-31 DIAGNOSIS — I4891 Unspecified atrial fibrillation: Secondary | ICD-10-CM

## 2014-01-03 ENCOUNTER — Other Ambulatory Visit: Payer: Self-pay | Admitting: *Deleted

## 2014-01-03 MED ORDER — TAMSULOSIN HCL 0.4 MG PO CAPS: ORAL_CAPSULE | ORAL | Status: DC

## 2014-01-03 NOTE — Telephone Encounter (Signed)
Faxed refill request. Last OV 09/07/12.  Last filled 30 capsule 11RF on  10/05/2012

## 2014-01-03 NOTE — Telephone Encounter (Signed)
Sent.  Has f/u pending.   

## 2014-01-10 ENCOUNTER — Other Ambulatory Visit: Payer: Self-pay | Admitting: Family Medicine

## 2014-01-11 LAB — PROTIME-INR
INR: 2.6 — AB (ref 0.8–1.2)
PROTHROMBIN TIME: 26.9 s — AB (ref 9.1–12.0)

## 2014-01-25 ENCOUNTER — Other Ambulatory Visit: Payer: Self-pay | Admitting: Cardiovascular Disease

## 2014-02-03 ENCOUNTER — Other Ambulatory Visit: Payer: Self-pay | Admitting: Family Medicine

## 2014-02-03 DIAGNOSIS — I4891 Unspecified atrial fibrillation: Secondary | ICD-10-CM

## 2014-02-03 DIAGNOSIS — I10 Essential (primary) hypertension: Secondary | ICD-10-CM

## 2014-02-08 ENCOUNTER — Other Ambulatory Visit: Payer: Self-pay | Admitting: Family Medicine

## 2014-02-08 ENCOUNTER — Other Ambulatory Visit (INDEPENDENT_AMBULATORY_CARE_PROVIDER_SITE_OTHER): Payer: Medicare Other

## 2014-02-08 DIAGNOSIS — I4891 Unspecified atrial fibrillation: Secondary | ICD-10-CM

## 2014-02-08 DIAGNOSIS — I10 Essential (primary) hypertension: Secondary | ICD-10-CM

## 2014-02-08 LAB — CBC WITH DIFFERENTIAL/PLATELET
Basophils Absolute: 0 10*3/uL (ref 0.0–0.1)
Basophils Relative: 0 % (ref 0.0–3.0)
EOS ABS: 0.1 10*3/uL (ref 0.0–0.7)
Eosinophils Relative: 3.1 % (ref 0.0–5.0)
HCT: 33.3 % — ABNORMAL LOW (ref 39.0–52.0)
Hemoglobin: 10.8 g/dL — ABNORMAL LOW (ref 13.0–17.0)
Lymphocytes Relative: 40.2 % (ref 12.0–46.0)
Lymphs Abs: 1.6 10*3/uL (ref 0.7–4.0)
MCHC: 32.3 g/dL (ref 30.0–36.0)
MCV: 76.2 fl — ABNORMAL LOW (ref 78.0–100.0)
MONO ABS: 0.7 10*3/uL (ref 0.1–1.0)
Monocytes Relative: 17.6 % — ABNORMAL HIGH (ref 3.0–12.0)
NEUTROS PCT: 39.1 % — AB (ref 43.0–77.0)
Neutro Abs: 1.6 10*3/uL (ref 1.4–7.7)
PLATELETS: 250 10*3/uL (ref 150.0–400.0)
RBC: 4.38 Mil/uL (ref 4.22–5.81)
RDW: 16.9 % — ABNORMAL HIGH (ref 11.5–15.5)
WBC: 4 10*3/uL (ref 4.0–10.5)

## 2014-02-08 LAB — LIPID PANEL
CHOL/HDL RATIO: 5
Cholesterol: 256 mg/dL — ABNORMAL HIGH (ref 0–200)
HDL: 55.2 mg/dL (ref >39.00)
LDL Cholesterol: 183 mg/dL — ABNORMAL HIGH (ref 0–99)
Triglycerides: 91 mg/dL (ref 0.0–149.0)
VLDL: 18.2 mg/dL (ref 0.0–40.0)

## 2014-02-08 LAB — TSH: TSH: 0.24 u[IU]/mL — AB (ref 0.35–4.50)

## 2014-02-08 LAB — PROTIME-INR
INR: 3 — AB (ref 0.8–1.2)
Prothrombin Time: 31.7 s — ABNORMAL HIGH (ref 9.1–12.0)

## 2014-02-08 NOTE — Addendum Note (Signed)
Addended by: Ellamae Sia on: 02/08/2014 11:24 AM   Modules accepted: Orders

## 2014-02-15 ENCOUNTER — Encounter: Payer: Self-pay | Admitting: Family Medicine

## 2014-02-15 ENCOUNTER — Ambulatory Visit: Payer: Medicare Other | Admitting: Cardiovascular Disease

## 2014-02-15 ENCOUNTER — Ambulatory Visit (INDEPENDENT_AMBULATORY_CARE_PROVIDER_SITE_OTHER): Payer: Medicare Other | Admitting: Family Medicine

## 2014-02-15 VITALS — BP 140/70 | HR 87 | Temp 97.9°F | Ht 71.0 in | Wt 192.0 lb

## 2014-02-15 DIAGNOSIS — R946 Abnormal results of thyroid function studies: Secondary | ICD-10-CM

## 2014-02-15 DIAGNOSIS — E7800 Pure hypercholesterolemia, unspecified: Secondary | ICD-10-CM

## 2014-02-15 DIAGNOSIS — R7989 Other specified abnormal findings of blood chemistry: Secondary | ICD-10-CM

## 2014-02-15 DIAGNOSIS — D509 Iron deficiency anemia, unspecified: Secondary | ICD-10-CM

## 2014-02-15 LAB — IBC PANEL
IRON: 23 ug/dL — AB (ref 42–165)
Saturation Ratios: 5.4 % — ABNORMAL LOW (ref 20.0–50.0)
Transferrin: 305.9 mg/dL (ref 212.0–360.0)

## 2014-02-15 LAB — T4, FREE: FREE T4: 1.44 ng/dL (ref 0.60–1.60)

## 2014-02-15 LAB — TSH: TSH: 0.29 u[IU]/mL — ABNORMAL LOW (ref 0.35–4.50)

## 2014-02-15 LAB — HEMOGLOBIN: HEMOGLOBIN: 10.5 g/dL — AB (ref 13.0–17.0)

## 2014-02-15 NOTE — Progress Notes (Signed)
Pre visit review using our clinic review tool, if applicable. No additional management support is needed unless otherwise documented below in the visit note.  AMV tabled in light of other issues.   Anemia.  H/o, recently off iron.  Some bruising, as expected with current meds.  No other bleeding.  No known blood in stool.  Had seen Dr. Seabron Spates with GI.  Some fatigue.    Low TSH, no neck sx.  See f/u labs.  On Amiodarone.  No neck mass or lumps.   H/o elevated LDL with mult statin intolerances.   PMH and SH reviewed  ROS: See HPI, otherwise noncontributory.  Meds, vitals, and allergies reviewed.   GEN: nad, alert and oriented HEENT: mucous membranes moist NECK: supple w/o LA CV: rrr.  no murmur PULM: ctab, no inc wob ABD: soft, +bs EXT: no edema SKIN: no acute rash

## 2014-02-15 NOTE — Patient Instructions (Signed)
Start back on iron twice a day and we'll be in touch.  Take care.

## 2014-02-16 ENCOUNTER — Encounter: Payer: Self-pay | Admitting: Family Medicine

## 2014-02-16 DIAGNOSIS — R7989 Other specified abnormal findings of blood chemistry: Secondary | ICD-10-CM | POA: Insufficient documentation

## 2014-02-16 NOTE — Assessment & Plan Note (Signed)
Statin intolerant, he's working on diet. Labs discussed.

## 2014-02-16 NOTE — Assessment & Plan Note (Signed)
Restart iron, see notes on labs.  Refer back to GI.

## 2014-02-16 NOTE — Assessment & Plan Note (Signed)
Likely from amiodarone, no TMG on exam. Will d/w cards.  F/u FT4 wnl.  See notes on labs.

## 2014-02-19 ENCOUNTER — Telehealth: Payer: Self-pay | Admitting: Family Medicine

## 2014-02-19 ENCOUNTER — Telehealth: Payer: Self-pay

## 2014-02-19 DIAGNOSIS — R7989 Other specified abnormal findings of blood chemistry: Secondary | ICD-10-CM

## 2014-02-19 NOTE — Telephone Encounter (Signed)
Patient advised.  Patient wishes to set up lab appt at another time later on.

## 2014-02-19 NOTE — Telephone Encounter (Signed)
Notify pt.  Talked to cards.  Would be reasonable to continue his amiodarone for now and recheck his thyroid labs in 6 months.  Continues as is in the meantime.  Thanks.  Orders are in.

## 2014-02-19 NOTE — Telephone Encounter (Signed)
Pt called and states he went for a physical with PCP and labs are in EPIC.

## 2014-02-20 ENCOUNTER — Other Ambulatory Visit: Payer: Self-pay | Admitting: Family Medicine

## 2014-02-20 MED ORDER — WARFARIN SODIUM 4 MG PO TABS: ORAL_TABLET | ORAL | Status: DC

## 2014-02-20 NOTE — Telephone Encounter (Signed)
I saw the labs already and discussed it with his PCP (Dr. Damita Dunnings). The plan is to repeat the liver test in 6 months.

## 2014-02-22 ENCOUNTER — Other Ambulatory Visit: Payer: Self-pay | Admitting: *Deleted

## 2014-02-22 NOTE — Telephone Encounter (Signed)
Please review for refill, Thank You. 

## 2014-03-09 ENCOUNTER — Other Ambulatory Visit: Payer: Self-pay | Admitting: Family Medicine

## 2014-03-14 ENCOUNTER — Ambulatory Visit (INDEPENDENT_AMBULATORY_CARE_PROVIDER_SITE_OTHER): Payer: Medicare Other | Admitting: Family Medicine

## 2014-03-14 LAB — POCT INR: INR: 3.7

## 2014-03-28 ENCOUNTER — Encounter: Payer: Self-pay | Admitting: Cardiovascular Disease

## 2014-03-28 ENCOUNTER — Ambulatory Visit (INDEPENDENT_AMBULATORY_CARE_PROVIDER_SITE_OTHER): Payer: Medicare Other | Admitting: Cardiovascular Disease

## 2014-03-28 VITALS — BP 122/72 | HR 92 | Ht 71.0 in | Wt 187.0 lb

## 2014-03-28 DIAGNOSIS — I10 Essential (primary) hypertension: Secondary | ICD-10-CM

## 2014-03-28 DIAGNOSIS — I4891 Unspecified atrial fibrillation: Secondary | ICD-10-CM

## 2014-03-28 DIAGNOSIS — I5022 Chronic systolic (congestive) heart failure: Secondary | ICD-10-CM

## 2014-03-28 MED ORDER — METOPROLOL TARTRATE 25 MG PO TABS: 25.0000 mg | ORAL_TABLET | Freq: Two times a day (BID) | ORAL | Status: DC

## 2014-03-28 NOTE — Patient Instructions (Signed)
Medication changes:   Stop: Carvedilol                                      Start:  Metoprolol Tartrate 25 mg 1 tablet twice a day  Your physician recommends that you schedule a follow-up appointment in: 4 months with Dr. Fletcher Anon

## 2014-03-28 NOTE — Assessment & Plan Note (Signed)
He is doing well from a cardiac standpoint with no symptoms suggestive of arrhythmia or heart failure. He is maintaining in sinus rhythm with amiodarone. Most recent labs showed suppressed TSH but with normal T4. I think the benefit of continuing amiodarone outweighs the risks at the present time. I agree with repeating thyroid function in 6 months.

## 2014-03-28 NOTE — Assessment & Plan Note (Signed)
Blood pressure improved after decreasing carvedilol and enalapril. He thinks that he is having side effects from carvedilol with vision problems. Thus, I changed this to metoprolol 25 mg twice daily as a trial. Most likely the vision problem is not related to carvedilol. Amiodarone can cause optic neuritis but according to him there was no significant findings by ophthalmology exam.

## 2014-03-28 NOTE — Progress Notes (Signed)
HPI  This is a pleasant 80 year old male who is here today for a follow up visit. He has a h/o persistent atrial fibrillation and nonischemic cardiomyopathy.   His afib has been associated with worsening CHF in the past. He had multiple cardioversions. He has known history of mitral valve disease status post mitral valve repair in the 90s. He has no history of coronary artery disease. He is on coumadin for stroke prevention. He was placed on amiodarone for recurrent atrial fibrillation which required cardioversion. He has been maintaining in sinus rhythm since then. His heart failure medications were titrated and Eplerenon was added.  Most recent echo in March of 2015 showed: Ejection fraction of 35% to 40%, mild aortic stenosis, mitral valve repair with mild to moderate regurgitation, mild to moderate tricuspid regurgitation and pulmonary pressure of 48 mmHg.   he has been doing well from a cardiac standpoint with no chest pain or dyspnea. He is having problems with his vision and thinks it is related to carvedilol. He saw ophthalmologist at [facility] with no significant abnormalities according to him.    Allergies  Allergen Reactions  . Atorvastatin Other (See Comments)    Other reaction(s): Unknown REACTION:muscular soreness  . Statins     Muscle aches on mult statins     Current Outpatient Prescriptions on File Prior to Visit  Medication Sig Dispense Refill  . amiodarone (PACERONE) 200 MG tablet TAKE ONE TABLET BY MOUTH ONCE DAILY  30 tablet  3  . carvedilol (COREG) 6.25 MG tablet Take 1 tablet (6.25 mg total) by mouth 2 (two) times daily.  60 tablet  6  . cholecalciferol (VITAMIN D) 1000 UNITS tablet Take 1,000 Units by mouth daily.      . cyanocobalamin 100 MCG tablet Take 100 mcg by mouth daily.        . enalapril (VASOTEC) 5 MG tablet Take 5 mg by mouth daily.       Marland Kitchen eplerenone (INSPRA) 25 MG tablet TAKE ONE TABLET BY MOUTH ONCE DAILY  90 tablet  3  . finasteride (PROSCAR) 5 MG  tablet TAKE ONE TABLET BY MOUTH ONCE DAILY  30 tablet  5  . fish oil-omega-3 fatty acids 1000 MG capsule Take 1 g by mouth daily.      . hydroxypropyl methylcellulose (ISOPTO TEARS) 2.5 % ophthalmic solution Place 1 drop into both eyes every morning.      . Multiple Vitamin (MULTIVITAMIN) tablet Take 1 tablet by mouth daily.        Marland Kitchen omeprazole (PRILOSEC) 20 MG capsule TAKE ONE CAPSULE BY MOUTH EVERY DAY  90 capsule  3  . tamsulosin (FLOMAX) 0.4 MG CAPS capsule TAKE ONE CAPSULE BY MOUTH EVERY DAY  30 capsule  12  . vitamin C (ASCORBIC ACID) 500 MG tablet Take 500 mg by mouth daily.      Marland Kitchen warfarin (COUMADIN) 4 MG tablet TAKE ONE TABLET BY MOUTH EVERY DAY OR AS DIRECTED  30 tablet  5   No current facility-administered medications on file prior to visit.     Past Medical History  Diagnosis Date  . Coronary atherosclerosis of unspecified type of vessel, native or graft 2008    cardioversion. Echo mod LVH EF 25%, Triv AR  Mild LAE, RAE 11/15/*11  . Long term (current) use of anticoagulants   . Atrial fibrillation   . Pure hypercholesterolemia   . Esophageal reflux   . Sprain of neck   . Dizziness and giddiness   .  Iron deficiency anemia, unspecified   . Unspecified sleep apnea   . Left heart failure   . Personal history of other diseases of digestive system   . Diaphragmatic hernia without mention of obstruction or gangrene   . Unspecified hemorrhoids without mention of complication   . Diverticulosis of colon (without mention of hemorrhage)   . Encounter for long-term (current) use of other medications   . Encounter for therapeutic drug monitoring   . Personal history of peptic ulcer disease   . Gastritis 12/18/08    EGD, 3 clips remaining (Dr. Vira Agar)  . Upper GI bleed 7/15-7/20/09    Hosp.  Elevated INR, coumadin stopped sinusitis  . Hypertension   . CHF (congestive heart failure)      Past Surgical History  Procedure Laterality Date  . Mitral valve repair  1995  .  Cholecystectomy    . Partial gastrectomy    . Appendectomy    . Cataract extraction  10/13/08    left  . Mitral valve annuloplasty  1996  . Cardioversion  12/17/2011    Procedure: CARDIOVERSION;  Surgeon: Hillary Bow, MD;  Location: [place];  Service: Cardiovascular;  Laterality: N/A;  . Cardiac catheterization  1996    [facility]  . Cardiac catheterization    . Cardiac catheterization       Family History  Problem Relation Age of Onset  . Cancer Father     lung     History   Social History  . Marital Status: Married    Spouse Name: N/A    Number of Children: 3  . 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 Smoking status: Never Smoker   . Smokeless tobacco: Never Used  . Alcohol Use: Yes     Comment: socially, beer   . Drug Use: No  . Sexual Activity: Not on file   Other Topics Concern  . Not on file   Social History Narrative   Daily caffeine use.     PHYSICAL EXAM   BP 122/72  Pulse 92  Ht 5\' 11"  (1.803 m)  Wt 187 lb (84.823 kg)  BMI 26.09 kg/m2 Constitutional: He is oriented to person, place, and time. He appears well-developed and well-nourished. No distress.  HENT: No nasal discharge.  Head: Normocephalic and atraumatic.  Eyes: Pupils are equal and round. Right eye exhibits no discharge. Left eye exhibits no discharge.  Neck: Normal range of motion. Neck supple. No JVD present. No thyromegaly present.  Cardiovascular: Normal rate, irregular rhythm, normal heart sounds and. Exam reveals no gallop and no friction rub. No murmur heard.  Pulmonary/Chest: Effort normal and breath sounds normal. No stridor. No respiratory distress. He has no wheezes. He has no rales. He exhibits no tenderness.  Abdominal: Soft. Bowel sounds are normal. He exhibits no distension. There is no tenderness. There is no rebound and no guarding.  Musculoskeletal: Normal range of motion. He exhibits no edema and no  tenderness.  Neurological: He is alert and oriented to person, place, and time. Coordination normal.  Skin: Skin is warm and dry. No rash noted. He is not diaphoretic. No erythema. No pallor.  Psychiatric: He has a normal mood and affect. His behavior is normal. Judgment and thought content normal.       EKG: Sinus  Rhythm  Low voltage in limb leads.   -Left axis -anterior fascicular block.   -  Old anterior infarct.   -  Nonspecific T-abnormality.   ABNORMAL    ASSESSMENT AND PLAN

## 2014-03-28 NOTE — Assessment & Plan Note (Signed)
Blood pressure is well controlled on current medications. 

## 2014-04-17 ENCOUNTER — Ambulatory Visit (INDEPENDENT_AMBULATORY_CARE_PROVIDER_SITE_OTHER): Payer: Medicare Other | Admitting: Family Medicine

## 2014-04-17 LAB — POCT INR: INR: 4.6

## 2014-05-14 LAB — PROTIME-INR

## 2014-05-15 ENCOUNTER — Ambulatory Visit (INDEPENDENT_AMBULATORY_CARE_PROVIDER_SITE_OTHER): Payer: Medicare Other | Admitting: Family Medicine

## 2014-05-15 ENCOUNTER — Other Ambulatory Visit: Payer: Self-pay | Admitting: Family Medicine

## 2014-05-15 ENCOUNTER — Encounter: Payer: Self-pay | Admitting: Family Medicine

## 2014-05-15 LAB — POCT INR
INR: 2.3 — AB (ref <=1.1)
INR: 4.6

## 2014-05-27 LAB — PROTIME-INR

## 2014-05-28 ENCOUNTER — Encounter: Payer: Self-pay | Admitting: Family Medicine

## 2014-05-29 ENCOUNTER — Ambulatory Visit (INDEPENDENT_AMBULATORY_CARE_PROVIDER_SITE_OTHER): Payer: Medicare Other | Admitting: Family Medicine

## 2014-05-29 ENCOUNTER — Telehealth: Payer: Self-pay | Admitting: Family Medicine

## 2014-05-29 LAB — POCT INR: INR: 2.2

## 2014-05-29 NOTE — Telephone Encounter (Signed)
Error

## 2014-06-06 ENCOUNTER — Other Ambulatory Visit: Payer: Self-pay | Admitting: Cardiovascular Disease

## 2014-07-07 ENCOUNTER — Other Ambulatory Visit: Payer: Self-pay | Admitting: Cardiovascular Disease

## 2014-07-09 LAB — PROTIME-INR

## 2014-07-10 ENCOUNTER — Encounter: Payer: Self-pay | Admitting: Family Medicine

## 2014-07-10 ENCOUNTER — Other Ambulatory Visit: Payer: Self-pay | Admitting: Family Medicine

## 2014-07-10 ENCOUNTER — Ambulatory Visit (INDEPENDENT_AMBULATORY_CARE_PROVIDER_SITE_OTHER): Payer: Medicare Other | Admitting: *Deleted

## 2014-07-10 DIAGNOSIS — Z7901 Long term (current) use of anticoagulants: Secondary | ICD-10-CM

## 2014-07-10 LAB — POCT INR
INR: 1.7
INR: 1.7 — AB (ref <=1.1)

## 2014-08-14 ENCOUNTER — Other Ambulatory Visit: Payer: Self-pay | Admitting: *Deleted

## 2014-08-15 MED ORDER — AMIODARONE HCL 200 MG PO TABS: ORAL_TABLET | ORAL | Status: DC

## 2014-08-19 LAB — PROTIME-INR: INR: 6 — AB (ref 0.9–1.1)

## 2014-08-20 ENCOUNTER — Encounter: Payer: Self-pay | Admitting: Family Medicine

## 2014-08-22 LAB — PROTIME-INR

## 2014-08-23 ENCOUNTER — Encounter: Payer: Self-pay | Admitting: Family Medicine

## 2014-09-02 ENCOUNTER — Encounter: Payer: Self-pay | Admitting: Cardiovascular Disease

## 2014-09-02 ENCOUNTER — Ambulatory Visit (INDEPENDENT_AMBULATORY_CARE_PROVIDER_SITE_OTHER): Payer: Medicare Other | Admitting: Cardiovascular Disease

## 2014-09-02 VITALS — BP 138/84 | HR 82 | Ht 71.0 in | Wt 185.8 lb

## 2014-09-02 DIAGNOSIS — I5022 Chronic systolic (congestive) heart failure: Secondary | ICD-10-CM

## 2014-09-02 DIAGNOSIS — I10 Essential (primary) hypertension: Secondary | ICD-10-CM

## 2014-09-02 DIAGNOSIS — I4891 Unspecified atrial fibrillation: Secondary | ICD-10-CM

## 2014-09-02 MED ORDER — METOPROLOL TARTRATE 25 MG PO TABS: 25.0000 mg | ORAL_TABLET | Freq: Two times a day (BID) | ORAL | Status: DC

## 2014-09-02 MED ORDER — ENALAPRIL MALEATE 5 MG PO TABS: 5.0000 mg | ORAL_TABLET | Freq: Every day | ORAL | Status: DC

## 2014-09-02 NOTE — Patient Instructions (Signed)
Your physician recommends that you have lab work today: TSH/ Liver function  Your physician has recommended you make the following change in your medication:  1) Start enalapril 5 mg once daily  Your physician wants you to follow-up in: 6 months with Dr. Fletcher Anon. You will receive a reminder letter in the mail two months in advance. If you don't receive a letter, please call our office to schedule the follow-up appointment.

## 2014-09-02 NOTE — Progress Notes (Signed)
HPI  This is a pleasant 80 year old male who is here today for a follow up visit. He has a h/o persistent atrial fibrillation and nonischemic cardiomyopathy.   His afib has been associated with worsening CHF in the past. He had multiple cardioversions. He has known history of mitral valve disease status post mitral valve repair in the 90s. He has no history of coronary artery disease. He is on coumadin for stroke prevention. He was placed on amiodarone for recurrent atrial fibrillation which required cardioversion. He has been maintaining in sinus rhythm since then. His heart failure medications were titrated and Eplerenon was added.  Most recent echo in March of 2015 showed: Ejection fraction of 35% to 40%, mild aortic stenosis, mitral valve repair with mild to moderate regurgitation, mild to moderate tricuspid regurgitation and pulmonary pressure of 48 mmHg.   he has been doing well from a cardiac standpoint with no chest pain or dyspnea.   Allergies  Allergen Reactions  . Atorvastatin Other (See Comments)    Other reaction(s): Unknown REACTION:muscular soreness  . Statins     Muscle aches on mult statins     Current Outpatient Prescriptions on File Prior to Visit  Medication Sig Dispense Refill  . amiodarone (PACERONE) 200 MG tablet TAKE ONE TABLET BY MOUTH ONCE DAILY 30 tablet 3  . cholecalciferol (VITAMIN D) 1000 UNITS tablet Take 1,000 Units by mouth daily.    . cyanocobalamin 100 MCG tablet Take 100 mcg by mouth daily.      Marland Kitchen eplerenone (INSPRA) 25 MG tablet TAKE ONE TABLET BY MOUTH ONCE DAILY 90 tablet 3  . finasteride (PROSCAR) 5 MG tablet TAKE ONE TABLET BY MOUTH ONCE DAILY 30 tablet 5  . fish oil-omega-3 fatty acids 1000 MG capsule Take 1 g by mouth daily.    . hydroxypropyl methylcellulose (ISOPTO TEARS) 2.5 % ophthalmic solution Place 1 drop into both eyes every morning.    . metoprolol tartrate (LOPRESSOR) 25 MG tablet Take 1 tablet (25 mg total) by mouth 2 (two) times  daily. 60 tablet 6  . Multiple Vitamin (MULTIVITAMIN) tablet Take 1 tablet by mouth daily.      Marland Kitchen omeprazole (PRILOSEC) 20 MG capsule TAKE ONE CAPSULE BY MOUTH EVERY DAY 90 capsule 3  . tamsulosin (FLOMAX) 0.4 MG CAPS capsule TAKE ONE CAPSULE BY MOUTH EVERY DAY 30 capsule 12  . vitamin C (ASCORBIC ACID) 500 MG tablet Take 500 mg by mouth daily.    Marland Kitchen warfarin (COUMADIN) 4 MG tablet TAKE ONE TABLET BY MOUTH EVERY DAY OR AS DIRECTED 30 tablet 5   No current facility-administered medications on file prior to visit.     Past Medical History  Diagnosis Date  . Coronary atherosclerosis of unspecified type of vessel, native or graft 2008    cardioversion. Echo mod LVH EF 25%, Triv AR  Mild LAE, RAE 11/15/*11  . Long term (current) use of anticoagulants   . Atrial fibrillation   . Pure hypercholesterolemia   . Esophageal reflux   . Sprain of neck   . Dizziness and giddiness   . Iron deficiency anemia, unspecified   . Unspecified sleep apnea   . Left heart failure   . Personal history of other diseases of digestive system   . Diaphragmatic hernia without mention of obstruction or gangrene   . Unspecified hemorrhoids without mention of complication   . Diverticulosis of colon (without mention of hemorrhage)   . Encounter for long-term (current) use of other medications   .  Encounter for therapeutic drug monitoring   . Personal history of peptic ulcer disease   . Gastritis 12/18/08    EGD, 3 clips remaining (Dr. Vira Agar)  . Upper GI bleed 7/15-7/20/09    Hosp.  Elevated INR, coumadin stopped sinusitis  . Hypertension   . CHF (congestive heart failure)      Past Surgical History  Procedure Laterality Date  . Mitral valve repair  1995  . Cholecystectomy    . Partial gastrectomy    . Appendectomy    . Cataract extraction  10/13/08    left  . Mitral valve annuloplasty  1996  . Cardioversion  12/17/2011    Procedure: CARDIOVERSION;  Surgeon: Hillary Bow, MD;  Location: [place];   Service: Cardiovascular;  Laterality: N/A;  . Cardiac catheterization  1996    [facility]  . Cardiac catheterization    . Cardiac catheterization       Family History  Problem Relation Age of Onset  . Cancer Father     lung     History   Social History  . Marital Status: Married    Spouse Name: N/A    Number of Children: 3  . 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 Smoking status: Never Smoker   . Smokeless tobacco: Never Used  . Alcohol Use: Yes     Comment: socially, beer   . Drug Use: No  . Sexual Activity: Not on file   Other Topics Concern  . Not on file   Social History Narrative   Daily caffeine use.     PHYSICAL EXAM   BP 138/84 mmHg  Pulse 82  Ht 5\' 11"  (1.803 m)  Wt 185 lb 12 oz (84.256 kg)  BMI 25.92 kg/m2 Constitutional: He is oriented to person, place, and time. He appears well-developed and well-nourished. No distress.  HENT: No nasal discharge.  Head: Normocephalic and atraumatic.  Eyes: Pupils are equal and round. Right eye exhibits no discharge. Left eye exhibits no discharge.  Neck: Normal range of motion. Neck supple. No JVD present. No thyromegaly present.  Cardiovascular: Normal rate, irregular rhythm, normal heart sounds and. Exam reveals no gallop and no friction rub. No murmur heard.  Pulmonary/Chest: Effort normal and breath sounds normal. No stridor. No respiratory distress. He has no wheezes. He has no rales. He exhibits no tenderness.  Abdominal: Soft. Bowel sounds are normal. He exhibits no distension. There is no tenderness. There is no rebound and no guarding.  Musculoskeletal: Normal range of motion. He exhibits no edema and no tenderness.  Neurological: He is alert and oriented to person, place, and time. Coordination normal.  Skin: Skin is warm and dry. No rash noted. He is not diaphoretic. No erythema. No pallor.  Psychiatric: He has a normal mood and  affect. His behavior is normal. Judgment and thought content normal.       EKG: Sinus  Rhythm  -First degree A-V block  PRi = 232 -Nonspecific QRS widening and anterior fascicular block.   -Poor R-wave progression -nonspecific -consider old anterior infarct.   -  Nonspecific T-abnormality.   ABNORMAL    ASSESSMENT AND PLAN

## 2014-09-03 LAB — HEPATIC FUNCTION PANEL
ALT: 25 IU/L (ref 0–44)
AST: 30 IU/L (ref 0–40)
Albumin: 4.2 g/dL (ref 3.5–4.8)
Alkaline Phosphatase: 53 IU/L (ref 39–117)
BILIRUBIN DIRECT: 0.16 mg/dL (ref 0.00–0.40)
BILIRUBIN TOTAL: 0.5 mg/dL (ref 0.0–1.2)
Total Protein: 6.6 g/dL (ref 6.0–8.5)

## 2014-09-03 LAB — TSH: TSH: 0.594 u[IU]/mL (ref 0.450–4.500)

## 2014-09-04 ENCOUNTER — Telehealth: Payer: Self-pay | Admitting: Family Medicine

## 2014-09-04 NOTE — Telephone Encounter (Signed)
Call pt.  Due for f/u INR.  See prev result note.  Thanks.

## 2014-09-04 NOTE — Telephone Encounter (Signed)
No answer at home number and no answering machine. Left message on cell phone to call back.

## 2014-09-05 NOTE — Telephone Encounter (Signed)
No answer at home number, no VM.  Left message on cell phone VM to return call.

## 2014-09-06 NOTE — Telephone Encounter (Signed)
Patient advised.   Patient says he gets it done at [facility] in [place] and plans to get it done on Monday.

## 2014-09-08 NOTE — Telephone Encounter (Signed)
Thanks

## 2014-09-10 LAB — PROTIME-INR

## 2014-09-11 ENCOUNTER — Other Ambulatory Visit: Payer: Self-pay | Admitting: Family Medicine

## 2014-09-11 ENCOUNTER — Encounter: Payer: Self-pay | Admitting: Family Medicine

## 2014-09-15 NOTE — Assessment & Plan Note (Signed)
Enalapril was added as outlined above.

## 2014-09-15 NOTE — Assessment & Plan Note (Signed)
Blood pressure is in the high normal range. I added enalapril 5 mg once daily.

## 2014-09-15 NOTE — Assessment & Plan Note (Signed)
He is maintaining a normal sinus rhythm with amiodarone. He is on long-term anticoagulation with warfarin. He appears to be stable.

## 2014-09-23 ENCOUNTER — Ambulatory Visit: Payer: Medicare Other

## 2014-09-30 LAB — PROTIME-INR

## 2014-10-10 ENCOUNTER — Encounter: Payer: Self-pay | Admitting: Family Medicine

## 2014-10-17 DIAGNOSIS — I4891 Unspecified atrial fibrillation: Secondary | ICD-10-CM | POA: Diagnosis not present

## 2014-10-18 ENCOUNTER — Telehealth: Payer: Self-pay | Admitting: Family Medicine

## 2014-10-18 ENCOUNTER — Ambulatory Visit: Payer: Self-pay | Admitting: Family Medicine

## 2014-10-18 LAB — POCT INR: INR: 2.3

## 2014-10-18 NOTE — Telephone Encounter (Signed)
Error

## 2014-10-31 ENCOUNTER — Other Ambulatory Visit: Payer: Self-pay | Admitting: Family Medicine

## 2014-10-31 NOTE — Telephone Encounter (Signed)
Electronic refill request. Last Filled:    30 tablet 5 RF on 02/20/2014  Please advise.

## 2014-11-01 NOTE — Telephone Encounter (Signed)
Sent. Thanks.   

## 2014-11-12 DIAGNOSIS — I4891 Unspecified atrial fibrillation: Secondary | ICD-10-CM | POA: Diagnosis not present

## 2014-11-12 LAB — PROTIME-INR

## 2014-11-13 ENCOUNTER — Encounter: Payer: Self-pay | Admitting: Family Medicine

## 2014-11-13 DIAGNOSIS — H3532 Exudative age-related macular degeneration: Secondary | ICD-10-CM | POA: Diagnosis not present

## 2014-12-23 DIAGNOSIS — I4891 Unspecified atrial fibrillation: Secondary | ICD-10-CM | POA: Diagnosis not present

## 2014-12-23 LAB — PROTIME-INR: INR: 1.7 — AB (ref 0.9–1.1)

## 2014-12-24 ENCOUNTER — Encounter: Payer: Self-pay | Admitting: Family Medicine

## 2014-12-25 DIAGNOSIS — H3532 Exudative age-related macular degeneration: Secondary | ICD-10-CM | POA: Diagnosis not present

## 2015-01-09 ENCOUNTER — Other Ambulatory Visit: Payer: Self-pay | Admitting: Cardiovascular Disease

## 2015-01-09 ENCOUNTER — Other Ambulatory Visit: Payer: Self-pay | Admitting: Family Medicine

## 2015-01-09 NOTE — Telephone Encounter (Signed)
Electronic refill request. Last Filled:    30 tablet 2 RF on 09/12/2014  Last office visit:   02/05/14   No upcoming appts scheduled. Please advise.

## 2015-01-09 NOTE — Telephone Encounter (Signed)
Refill Inspra for one month need to contact PCP.

## 2015-01-10 NOTE — Telephone Encounter (Signed)
Patient notified, will call back to schedule the physical.

## 2015-01-10 NOTE — Telephone Encounter (Signed)
Sent.  Needs CPE scheduled.  Thanks.  

## 2015-01-17 ENCOUNTER — Other Ambulatory Visit: Payer: Self-pay | Admitting: Family Medicine

## 2015-01-20 ENCOUNTER — Other Ambulatory Visit: Payer: Self-pay | Admitting: Family Medicine

## 2015-01-23 DIAGNOSIS — I4891 Unspecified atrial fibrillation: Secondary | ICD-10-CM | POA: Diagnosis not present

## 2015-01-23 LAB — PROTIME-INR

## 2015-01-23 LAB — POCT INR: INR: 2.9 — AB (ref <=1.1)

## 2015-01-24 ENCOUNTER — Encounter: Payer: Self-pay | Admitting: Family Medicine

## 2015-01-24 ENCOUNTER — Other Ambulatory Visit: Payer: Self-pay | Admitting: *Deleted

## 2015-01-24 NOTE — Op Note (Signed)
PATIENT NAME:  [name], [name] MR#:  [id number] DATE OF BIRTH:  1933/05/28  DATE OF PROCEDURE:  10/20/2012  PROCEDURE PERFORMED: Cardioversion for atrial fibrillation.   INDICATION: Atrial fibrillation with heart failure.   PROCEDURE DETAILS: A standard informed consent was obtained. The patient has been chronically anticoagulated with warfarin with therapeutic INR. He was started on amiodarone a few weeks ago. Sedation anesthesia was performed by the anesthesia team. The pads were applied in the anterior-posterior fashion. He was cardioverted with one shock at 200 joules. He converted to normal sinus rhythm with first degree AV block. There were no immediate complications.   CONCLUSION: Successful cardioversion from atrial fibrillation to normal sinus rhythm.   RECOMMENDATIONS: Continue current medications. Follow up in two weeks.  ____________________________ Mertie Clause Fletcher Anon, MD maa:sb D: 10/20/2012 08:07:27 ET T: 10/20/2012 08:21:50 ET JOB#: [id number]  cc: Sonia Bromell A. Fletcher Anon, MD, <Dictator> Wellington Hampshire MD ELECTRONICALLY SIGNED 10/24/2012 17:45

## 2015-01-30 ENCOUNTER — Encounter: Payer: Self-pay | Admitting: Family Medicine

## 2015-01-30 ENCOUNTER — Ambulatory Visit (INDEPENDENT_AMBULATORY_CARE_PROVIDER_SITE_OTHER): Payer: Medicare Other | Admitting: Family Medicine

## 2015-01-30 VITALS — BP 124/68 | HR 74 | Temp 98.0°F | Ht 71.0 in | Wt 183.5 lb

## 2015-01-30 DIAGNOSIS — N401 Enlarged prostate with lower urinary tract symptoms: Secondary | ICD-10-CM | POA: Diagnosis not present

## 2015-01-30 DIAGNOSIS — I10 Essential (primary) hypertension: Secondary | ICD-10-CM | POA: Diagnosis not present

## 2015-01-30 DIAGNOSIS — Z7901 Long term (current) use of anticoagulants: Secondary | ICD-10-CM | POA: Insufficient documentation

## 2015-01-30 DIAGNOSIS — I4891 Unspecified atrial fibrillation: Secondary | ICD-10-CM

## 2015-01-30 DIAGNOSIS — E7800 Pure hypercholesterolemia, unspecified: Secondary | ICD-10-CM

## 2015-01-30 DIAGNOSIS — Z0000 Encounter for general adult medical examination without abnormal findings: Secondary | ICD-10-CM

## 2015-01-30 DIAGNOSIS — N138 Other obstructive and reflux uropathy: Secondary | ICD-10-CM

## 2015-01-30 DIAGNOSIS — Z7189 Other specified counseling: Secondary | ICD-10-CM

## 2015-01-30 MED ORDER — TAMSULOSIN HCL 0.4 MG PO CAPS: 0.4000 mg | ORAL_CAPSULE | Freq: Every day | ORAL | Status: DC

## 2015-01-30 MED ORDER — FINASTERIDE 5 MG PO TABS: 5.0000 mg | ORAL_TABLET | Freq: Every day | ORAL | Status: DC

## 2015-01-30 NOTE — Progress Notes (Signed)
Pre visit review using our clinic review tool, if applicable. No additional management support is needed unless otherwise documented below in the visit note.  I have personally reviewed the Medicare Annual Wellness questionnaire and have noted 1. The patient's medical and social history 2. Their use of alcohol, tobacco or illicit drugs 3. Their current medications and supplements 4. The patient's functional ability including ADL's, fall risks, home safety risks and hearing or visual             impairment. 5. Diet and physical activities 6. Evidence for depression or mood disorders  The patients weight, height, BMI have been recorded in the chart and visual acuity is per eye clinic.  I have made referrals, counseling and provided education to the patient based review of the above and I have provided the pt with a written personalized care plan for preventive services.  Provider list updated- see scanned forms.  Routine anticipatory guidance given to patient.  See health maintenance.  Flu 2015 Shingles d/w pt.  PNA 2015 Tetanus 2011 Colonoscopy NA, will age out soon, he didn't want to repeat given his coumadin use.  Prostate cancer screening and PSA options (with potential risks and benefits of testing vs not testing) were discussed along with recent recs/guidelines.  He declined testing PSA at this point. Advance directive- wife designated if patient were incapacitated.   Cognitive function addressed- see scanned forms- and if abnormal then additional documentation follows.   He had h/o low iron prev, with prev neg w/u.  Still on iron orally.  Due for f/u labs.    H/o BPH with LUTS resolved on current meds.  No ADE.  No dysuria.    Macular degeneration, ongoing injections per eye clinic.  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   Hypertension:    Using medication without problems or lightheadedness: yes Chest pain with exertion:no Edema:no Short of breath:no No palpitations, no sensation of  being out of rhythm.   Due for labs.   Order given to patient for CBC, CMET, TSH, lipid panel.   PMH and SH reviewed  Meds, vitals, and allergies reviewed.   ROS: See HPI.  Otherwise negative.    GEN: nad, alert and oriented HEENT: mucous membranes moist NECK: supple w/o LA CV: rrr. PULM: ctab, no inc wob ABD: soft, +bs EXT: no edema SKIN: no acute rash

## 2015-01-30 NOTE — Patient Instructions (Addendum)
Check with your insurance to see if they will cover the shingles shot. Get you labs done and have them send me the results.   Take care.  Glad to see you.

## 2015-01-31 ENCOUNTER — Encounter: Payer: Self-pay | Admitting: Family Medicine

## 2015-01-31 DIAGNOSIS — Z0000 Encounter for general adult medical examination without abnormal findings: Secondary | ICD-10-CM | POA: Insufficient documentation

## 2015-01-31 DIAGNOSIS — Z7189 Other specified counseling: Secondary | ICD-10-CM | POA: Insufficient documentation

## 2015-01-31 NOTE — Assessment & Plan Note (Signed)
Noted statin intolerance.  Labs pending.

## 2015-01-31 NOTE — Assessment & Plan Note (Signed)
Has h/o LUTS but controlled on meds, now w/u LUTS.  Doing well.  Continue as is.  Declined PSA and that is reasonable.  He wouldn't want to come off warfarin if needed for w/u.

## 2015-01-31 NOTE — Assessment & Plan Note (Signed)
Controlled, continue as is.  Labs pending.  He'll continue work on [employer], exercise.

## 2015-01-31 NOTE — Assessment & Plan Note (Addendum)
No sx that he has noted, sounds to be RRR today.  Doing well.  Okay to continue anticoagulation.

## 2015-01-31 NOTE — Assessment & Plan Note (Signed)
Flu 2015 Shingles d/w pt.  PNA 2015 Tetanus 2011 Colonoscopy NA, will age out soon, he didn't want to repeat given his coumadin use.  Prostate cancer screening and PSA options (with potential risks and benefits of testing vs not testing) were discussed along with recent recs/guidelines.  He declined testing PSA at this point. Advance directive- wife designated if patient were incapacitated.   Cognitive function addressed- see scanned forms- and if abnormal then additional documentation follows.

## 2015-02-17 ENCOUNTER — Telehealth: Payer: Self-pay | Admitting: *Deleted

## 2015-02-17 ENCOUNTER — Other Ambulatory Visit: Payer: Self-pay

## 2015-02-17 DIAGNOSIS — H3532 Exudative age-related macular degeneration: Secondary | ICD-10-CM | POA: Diagnosis not present

## 2015-02-17 DIAGNOSIS — H43812 Vitreous degeneration, left eye: Secondary | ICD-10-CM | POA: Diagnosis not present

## 2015-02-17 NOTE — Telephone Encounter (Signed)
1. Which medications need to be refilled? Eplerenone   2. Which pharmacy is medication to be sent to? [facility] in [place]   3. Do they need a 30 day or 90 day supply? 30   4. Would they like a call back once the medication has been sent to the pharmacy? No

## 2015-02-17 NOTE — Telephone Encounter (Signed)
Error

## 2015-02-17 NOTE — Telephone Encounter (Signed)
Call Documentation      Anselm Pancoast, CMA at 01/09/2015 11:40 AM     Status: Signed       Expand All Collapse All   Refill Inspra for one month need to contact PCP.             Encounter MyChart Messages     No messages in this encounter     Approved      Disp Refills Start End    eplerenone (INSPRA) 25 MG tablet 30 tablet 0 01/09/2015     Sig:  TAKE ONE TABLET BY MOUTH ONCE DAILY    Class:  Normal    DAW:  No    Authorizing Provider:  Wellington Hampshire, MD    Ordering User:  Anselm Pancoast, CMA

## 2015-02-18 MED ORDER — EPLERENONE 25 MG PO TABS: 25.0000 mg | ORAL_TABLET | Freq: Every day | ORAL | Status: DC

## 2015-02-18 NOTE — Telephone Encounter (Signed)
Refill sent for inspra (Eplerenone)

## 2015-02-20 DIAGNOSIS — Z4420 Encounter for fitting and adjustment of artificial eye, unspecified: Secondary | ICD-10-CM | POA: Diagnosis not present

## 2015-02-24 DIAGNOSIS — Z7901 Long term (current) use of anticoagulants: Secondary | ICD-10-CM | POA: Diagnosis not present

## 2015-02-24 DIAGNOSIS — I10 Essential (primary) hypertension: Secondary | ICD-10-CM | POA: Diagnosis not present

## 2015-02-25 ENCOUNTER — Encounter: Payer: Self-pay | Admitting: Family Medicine

## 2015-03-05 ENCOUNTER — Telehealth: Payer: Self-pay

## 2015-03-05 NOTE — Telephone Encounter (Signed)
Patient followed by Cardiology. EF 35-40% as of 12/31/13. Patient currently on metoprolol. Please consider switching patient to metoprolol succinate (approved beta blocker) to meet CHF quality metrics.

## 2015-03-07 ENCOUNTER — Telehealth: Payer: Self-pay | Admitting: Family Medicine

## 2015-03-07 NOTE — Telephone Encounter (Signed)
Patient returned Lugene's call. °

## 2015-03-07 NOTE — Telephone Encounter (Signed)
Please call pt.  He is on short acting metoprolol.  The best evidence in heart failure is for long acting metoprolol.  I talked to Dr. Fletcher Anon about this and he is okay with the change. Would change from short acting 25mg  BID to long acting 50mg  once a day.  It is still metoprolol, and the dose would be the same overall, ie total per day If patient is okay, then let me know. I'll send it in at that point.  There shouldn't be big price difference and it may be more convenient with qd dosing, instead of BID.  Thanks.

## 2015-03-07 NOTE — Telephone Encounter (Signed)
Left message with patient's wife to return call.

## 2015-03-10 ENCOUNTER — Other Ambulatory Visit: Payer: Self-pay | Admitting: *Deleted

## 2015-03-10 MED ORDER — METOPROLOL SUCCINATE ER 50 MG PO TB24: 50.0000 mg | ORAL_TABLET | Freq: Every day | ORAL | Status: DC

## 2015-03-10 NOTE — Telephone Encounter (Signed)
Patient advised.  New Rx sent to pharmacy, [facility] in [place].

## 2015-03-12 DIAGNOSIS — L570 Actinic keratosis: Secondary | ICD-10-CM | POA: Diagnosis not present

## 2015-03-12 DIAGNOSIS — L578 Other skin changes due to chronic exposure to nonionizing radiation: Secondary | ICD-10-CM | POA: Diagnosis not present

## 2015-03-12 DIAGNOSIS — D485 Neoplasm of uncertain behavior of skin: Secondary | ICD-10-CM | POA: Diagnosis not present

## 2015-03-12 DIAGNOSIS — C44321 Squamous cell carcinoma of skin of nose: Secondary | ICD-10-CM | POA: Diagnosis not present

## 2015-03-17 ENCOUNTER — Other Ambulatory Visit: Payer: Self-pay

## 2015-03-18 ENCOUNTER — Telehealth: Payer: Self-pay

## 2015-03-18 MED ORDER — FUROSEMIDE 40 MG PO TABS: 40.0000 mg | ORAL_TABLET | Freq: Every day | ORAL | Status: DC | PRN

## 2015-03-18 NOTE — Telephone Encounter (Signed)
Received a refill request from [facility] for furosemide 40 mg one tablet daily as needed; do not see the furosemide on his medication list. The patient has a follow up at the end of July with Dr. Fletcher Anon but he would like to have the fluid pill refilled before since he has taking the fluid more so lately. The patient denies shortness of breath. Please advise if we can call in a new Rx for the furosemide.

## 2015-03-18 NOTE — Telephone Encounter (Signed)
New Rx sent for furosemide 40 mg take one tablet as needed per Dr. Fletcher Anon.

## 2015-03-18 NOTE — Telephone Encounter (Signed)
That is fine 

## 2015-03-27 DIAGNOSIS — Z7689 Persons encountering health services in other specified circumstances: Secondary | ICD-10-CM | POA: Diagnosis not present

## 2015-04-02 DIAGNOSIS — C44321 Squamous cell carcinoma of skin of nose: Secondary | ICD-10-CM | POA: Diagnosis not present

## 2015-04-14 DIAGNOSIS — H3532 Exudative age-related macular degeneration: Secondary | ICD-10-CM | POA: Diagnosis not present

## 2015-04-15 ENCOUNTER — Other Ambulatory Visit: Payer: Self-pay | Admitting: Cardiovascular Disease

## 2015-04-17 ENCOUNTER — Telehealth: Payer: Self-pay

## 2015-04-17 ENCOUNTER — Telehealth: Payer: Self-pay | Admitting: Family Medicine

## 2015-04-17 MED ORDER — BENZONATATE 200 MG PO CAPS: 200.0000 mg | ORAL_CAPSULE | Freq: Three times a day (TID) | ORAL | Status: DC | PRN

## 2015-04-17 NOTE — Telephone Encounter (Signed)
[facility] Patient Name: [name] DOB: 27-Oct-1932 Initial Comment Caller states husband has congestions, coughing, can he get something called in for congestion Nurse Assessment Nurse: Erlene Quan, RN, Manuela Schwartz Date/Time Eilene Ghazi Time): 04/17/2015 12:16:34 PM Confirm and document reason for call. If symptomatic, describe symptoms. ---Caller states husband has congestions, coughing, can he get something called in for congestion - caller is very upset states she has been getting the run around since 8:30 this morning - she wants something called in for her husband and she needs it before he dies - he is sick and does not feel like getting gout in this 100 degree weather to be seen - she has multiple issues with this office and states she wants to speak with Dr Damita Dunnings himself , please have Dr Damita Dunnings call her back Has the patient traveled out of the country within the last 30 days? ---Not Applicable Does the patient require triage? ---Declined Triage Please document clinical information provided and list any resource used. ---Advised Mrs. Waring I will call the office and also send message over that she would like to speak with Dr Damita Dunnings - someone from the office will return her call Guidelines Guideline Title Affirmed Question Affirmed Notes Final Disposition User Comments It was originally noted that this was patient of Dr Glori Bickers , but pt is actually pt of Dr Damita Dunnings - called the office to make them aware of this call so that is can be referred to appropriate persons attention

## 2015-04-17 NOTE — Telephone Encounter (Signed)
Wife advised. 

## 2015-04-17 NOTE — Telephone Encounter (Signed)
Please call her back.  Take OTC mucinex (not mucinex D) with plenty of fluids.   Take tessalon for the cough.  rx sent.  Rest.   Fu prn.  Thanks.

## 2015-04-17 NOTE — Telephone Encounter (Signed)
Ms Levesque said left message for team health but has not called back yet and pt is having a lot of head congestion,when he blows nose has thick white mucus; has prod cough which is also thick white phlegm. no fever or sorethroat. Pt recently  had skin Cancer removed at [facility] and pt is not able to come to office. Pt is presently taking Equate nighttime for congestion and cough without relief. Ms Dupuis  Request med sent [facility] and she also request cb. Does not want to make appt.Please advise.

## 2015-04-17 NOTE — Telephone Encounter (Signed)
There is also another phone note for 04/17/15 about congestion and coughing.

## 2015-04-28 ENCOUNTER — Encounter: Payer: Self-pay | Admitting: Cardiovascular Disease

## 2015-04-28 ENCOUNTER — Ambulatory Visit (INDEPENDENT_AMBULATORY_CARE_PROVIDER_SITE_OTHER): Payer: Medicare Other | Admitting: Cardiovascular Disease

## 2015-04-28 VITALS — BP 140/82 | HR 85 | Ht 71.0 in | Wt 181.5 lb

## 2015-04-28 DIAGNOSIS — Z9889 Other specified postprocedural states: Secondary | ICD-10-CM

## 2015-04-28 DIAGNOSIS — I5022 Chronic systolic (congestive) heart failure: Secondary | ICD-10-CM

## 2015-04-28 DIAGNOSIS — I4819 Other persistent atrial fibrillation: Secondary | ICD-10-CM

## 2015-04-28 DIAGNOSIS — I4891 Unspecified atrial fibrillation: Secondary | ICD-10-CM | POA: Diagnosis not present

## 2015-04-28 DIAGNOSIS — I481 Persistent atrial fibrillation: Secondary | ICD-10-CM

## 2015-04-28 NOTE — Patient Instructions (Signed)
Medication Instructions: Continue same medications.   Labwork: None.   Procedures/Testing: None.   Follow-Up: 6 months with Dr. Arida.   Any Additional Special Instructions Will Be Listed Below (If Applicable).   

## 2015-04-28 NOTE — Assessment & Plan Note (Signed)
The patient is maintaining a normal sinus rhythm on amiodarone. He had labs checked in May which showed normal liver enzymes and thyroid function. He is on long-term anticoagulation with warfarin. He might be interested in switching to our anticoagulation clinic. I am going to check the co-pay for him so he can decide.

## 2015-04-28 NOTE — Assessment & Plan Note (Signed)
He does have associated mild aortic stenosis. I will consider repeat echocardiogram next year.

## 2015-04-28 NOTE — Progress Notes (Signed)
HPI  This is a pleasant 81 -year-old male who is here today for a follow up visit. He has a h/o persistent atrial fibrillation and nonischemic cardiomyopathy.   His afib has been associated with worsening CHF in the past. He had multiple cardioversions. He has known history of mitral valve disease status post mitral valve repair in the 90s. He has no history of coronary artery disease. He is on coumadin for stroke prevention. He was placed on amiodarone for recurrent atrial fibrillation which required cardioversion. He has been maintaining in sinus rhythm since then.  Most recent echo in March of 2015 showed: Ejection fraction of 35% to 40%, mild aortic stenosis, mitral valve repair with mild to moderate regurgitation, mild to moderate tricuspid regurgitation and pulmonary pressure of 48 mmHg.   he has been doing well from a cardiac standpoint with no chest pain or dyspnea.   Allergies  Allergen Reactions  . Atorvastatin Other (See Comments)    Other reaction(s): Unknown REACTION:muscular soreness  . Statins     Muscle aches on mult statins     Current Outpatient Prescriptions on File Prior to Visit  Medication Sig Dispense Refill  . amiodarone (PACERONE) 200 MG tablet TAKE ONE TABLET BY MOUTH ONCE DAILY 30 tablet 3  . benzonatate (TESSALON) 200 MG capsule Take 1 capsule (200 mg total) by mouth 3 (three) times daily as needed. 30 capsule 1  . cholecalciferol (VITAMIN D) 1000 UNITS tablet Take 1,000 Units by mouth daily.    . cyanocobalamin 100 MCG tablet Take 100 mcg by mouth daily.      . enalapril (VASOTEC) 5 MG tablet Take 1 tablet (5 mg total) by mouth daily. 90 tablet 3  . eplerenone (INSPRA) 25 MG tablet Take 1 tablet (25 mg total) by mouth daily. 30 tablet 6  . ferrous sulfate 325 (65 FE) MG tablet Take 325 mg by mouth daily with breakfast.    . finasteride (PROSCAR) 5 MG tablet Take 1 tablet (5 mg total) by mouth daily. 90 tablet 3  . fish oil-omega-3 fatty acids 1000 MG  capsule Take 1 g by mouth daily.    . furosemide (LASIX) 40 MG tablet Take 1 tablet (40 mg total) by mouth daily as needed. 30 tablet 3  . hydroxypropyl methylcellulose (ISOPTO TEARS) 2.5 % ophthalmic solution Place 1 drop into both eyes every morning.    . metoprolol succinate (TOPROL-XL) 50 MG 24 hr tablet Take 1 tablet (50 mg total) by mouth daily. Take with or immediately following a meal. 30 tablet 5  . Multiple Vitamin (MULTIVITAMIN) tablet Take 1 tablet by mouth daily.      Marland Kitchen omeprazole (PRILOSEC) 20 MG capsule TAKE ONE CAPSULE BY MOUTH EVERY DAY 90 capsule 3  . tamsulosin (FLOMAX) 0.4 MG CAPS capsule Take 1 capsule (0.4 mg total) by mouth daily. 90 capsule 3  . vitamin C (ASCORBIC ACID) 500 MG tablet Take 500 mg by mouth daily.    Marland Kitchen warfarin (COUMADIN) 4 MG tablet TAKE ONE TABLET BY MOUTH ONCE DAILY OR AS DIRECTED 30 tablet 12   No current facility-administered medications on file prior to visit.     Past Medical History  Diagnosis Date  . Coronary atherosclerosis of unspecified type of vessel, native or graft 2008    cardioversion. Echo mod LVH EF 25%, Triv AR  Mild LAE, RAE 11/15/*11  . Long term (current) use of anticoagulants   . Atrial fibrillation   . Pure hypercholesterolemia   .  Esophageal reflux   . Sprain of neck   . Dizziness and giddiness   . Iron deficiency anemia, unspecified   . Unspecified sleep apnea   . Left heart failure   . Personal history of other diseases of digestive system   . Diaphragmatic hernia without mention of obstruction or gangrene   . Unspecified hemorrhoids without mention of complication   . Diverticulosis of colon (without mention of hemorrhage)   . Encounter for long-term (current) use of other medications   . Encounter for therapeutic drug monitoring   . Personal history of peptic ulcer disease   . Gastritis 12/18/08    EGD, 3 clips remaining (Dr. Vira Agar)  . Upper GI bleed 7/15-7/20/09    Hosp.  Elevated INR, coumadin stopped  sinusitis  . Hypertension   . CHF (congestive heart failure)   . Blind right eye   . Macular degeneration      Past Surgical History  Procedure Laterality Date  . Mitral valve repair  1995  . Cholecystectomy    . Partial gastrectomy    . Appendectomy    . Cataract extraction  10/13/08    left  . Mitral valve annuloplasty  1996  . Cardioversion  12/17/2011    Procedure: CARDIOVERSION;  Surgeon: Hillary Bow, MD;  Location: [facility];  Service: Cardiovascular;  Laterality: N/A;  . Cardiac catheterization  1996    [facility]  . Cardiac catheterization    . Cardiac catheterization       Family History  Problem Relation Age of Onset  . Cancer Father     lung  . Colon cancer Neg Hx   . Prostate cancer Neg Hx      History   Social History  . Marital Status: Married    Spouse Name: N/A  . Number of Children: 3  . 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 Smoking status: Never Smoker   . Smokeless tobacco: Never Used  . Alcohol Use: Yes     Comment: socially, beer   . Drug Use: No  . Sexual Activity: Not on file   Other Topics Concern  . Not on file   Social History Narrative   Married 20+ years   3 kids     PHYSICAL EXAM   BP 140/82 mmHg  Pulse 85  Ht 5\' 11"  (1.803 m)  Wt 181 lb 8 oz (82.328 kg)  BMI 25.33 kg/m2 Constitutional: He is oriented to person, place, and time. He appears well-developed and well-nourished. No distress.  HENT: No nasal discharge.  Head: Normocephalic and atraumatic.  Eyes: Pupils are equal and round. Right eye exhibits no discharge. Left eye exhibits no discharge.  Neck: Normal range of motion. Neck supple. No JVD present. No thyromegaly present.  Cardiovascular: Normal rate, irregular rhythm, normal heart sounds and. Exam reveals no gallop and no friction rub. There is a 2/6 systolic ejection murmur in the aortic area which is early peaking.  Pulmonary/Chest:  Effort normal and breath sounds normal. No stridor. No respiratory distress. He has no wheezes. He has no rales. He exhibits no tenderness.  Abdominal: Soft. Bowel sounds are normal. He exhibits no distension. There is no tenderness. There is no rebound and no guarding.  Musculoskeletal: Normal range of motion. He exhibits no edema and no tenderness.  Neurological: He is alert and oriented to person, place, and time. Coordination normal.  Skin: Skin is warm and dry. No rash noted. He is not diaphoretic. No erythema. No pallor.  Psychiatric: He has a normal mood and affect. His behavior is normal. Judgment and thought content normal.       EKG: Sinus  Rhythm  -First degree A-V block  PRi = 224 Low voltage in limb leads.   -Nonspecific QRS widening.   -Poor R-wave progression -may be secondary to pulmonary disease   consider old anterior infarct.   -  Nonspecific T-abnormality.   ASSESSMENT AND PLAN

## 2015-04-28 NOTE — Assessment & Plan Note (Signed)
He appears to be euvolemic. He uses for some mild only as needed. He is on optimal medical therapy.

## 2015-04-30 DIAGNOSIS — I4891 Unspecified atrial fibrillation: Secondary | ICD-10-CM | POA: Diagnosis not present

## 2015-04-30 LAB — POCT INR: INR: 1.6

## 2015-05-01 ENCOUNTER — Ambulatory Visit (INDEPENDENT_AMBULATORY_CARE_PROVIDER_SITE_OTHER): Payer: Medicare Other | Admitting: *Deleted

## 2015-05-01 NOTE — Progress Notes (Signed)
Pre visit review using our clinic review tool, if applicable. No additional management support is needed unless otherwise documented below in the visit note. 

## 2015-06-02 DIAGNOSIS — I4891 Unspecified atrial fibrillation: Secondary | ICD-10-CM | POA: Diagnosis not present

## 2015-06-02 LAB — POCT INR: INR: 2.5

## 2015-06-03 ENCOUNTER — Ambulatory Visit (INDEPENDENT_AMBULATORY_CARE_PROVIDER_SITE_OTHER): Payer: Medicare Other | Admitting: *Deleted

## 2015-06-05 NOTE — Progress Notes (Signed)
Pre visit review using our clinic review tool, if applicable. No additional management support is needed unless otherwise documented below in the visit note. 

## 2015-06-16 DIAGNOSIS — H3532 Exudative age-related macular degeneration: Secondary | ICD-10-CM | POA: Diagnosis not present

## 2015-06-24 ENCOUNTER — Other Ambulatory Visit: Payer: Self-pay

## 2015-06-24 MED ORDER — OMEPRAZOLE 20 MG PO CPDR: 20.0000 mg | DELAYED_RELEASE_CAPSULE | Freq: Every day | ORAL | Status: DC

## 2015-07-01 ENCOUNTER — Other Ambulatory Visit: Payer: Self-pay | Admitting: Family Medicine

## 2015-07-01 DIAGNOSIS — I4891 Unspecified atrial fibrillation: Secondary | ICD-10-CM | POA: Diagnosis not present

## 2015-07-02 LAB — PROTIME-INR
INR: 2.6 — ABNORMAL HIGH (ref 0.8–1.2)
Prothrombin Time: 26 s — ABNORMAL HIGH (ref 9.1–12.0)

## 2015-07-03 ENCOUNTER — Ambulatory Visit (INDEPENDENT_AMBULATORY_CARE_PROVIDER_SITE_OTHER): Payer: Medicare Other | Admitting: *Deleted

## 2015-07-03 LAB — POCT INR: INR: 2.6

## 2015-07-03 NOTE — Progress Notes (Signed)
Pre visit review using our clinic review tool, if applicable. No additional management support is needed unless otherwise documented below in the visit note. 

## 2015-07-29 ENCOUNTER — Other Ambulatory Visit: Payer: Self-pay | Admitting: Family Medicine

## 2015-07-29 DIAGNOSIS — I4891 Unspecified atrial fibrillation: Secondary | ICD-10-CM | POA: Diagnosis not present

## 2015-07-29 LAB — POCT INR: INR: 3.7

## 2015-07-30 ENCOUNTER — Ambulatory Visit (INDEPENDENT_AMBULATORY_CARE_PROVIDER_SITE_OTHER): Payer: Medicare Other | Admitting: *Deleted

## 2015-07-30 LAB — PROTIME-INR
INR: 3.7 — ABNORMAL HIGH (ref 0.8–1.2)
PROTHROMBIN TIME: 37.2 s — AB (ref 9.1–12.0)

## 2015-07-30 NOTE — Progress Notes (Signed)
Pre visit review using our clinic review tool, if applicable. No additional management support is needed unless otherwise documented below in the visit note. 

## 2015-08-12 ENCOUNTER — Encounter: Payer: Self-pay | Admitting: Gastroenterology

## 2015-08-18 DIAGNOSIS — H353222 Exudative age-related macular degeneration, left eye, with inactive choroidal neovascularization: Secondary | ICD-10-CM | POA: Diagnosis not present

## 2015-08-25 ENCOUNTER — Other Ambulatory Visit: Payer: Self-pay | Admitting: Family Medicine

## 2015-08-25 DIAGNOSIS — R1013 Epigastric pain: Secondary | ICD-10-CM | POA: Diagnosis not present

## 2015-08-25 DIAGNOSIS — I4891 Unspecified atrial fibrillation: Secondary | ICD-10-CM | POA: Diagnosis not present

## 2015-08-25 DIAGNOSIS — Z8719 Personal history of other diseases of the digestive system: Secondary | ICD-10-CM | POA: Diagnosis not present

## 2015-08-25 DIAGNOSIS — D508 Other iron deficiency anemias: Secondary | ICD-10-CM | POA: Diagnosis not present

## 2015-08-25 DIAGNOSIS — K219 Gastro-esophageal reflux disease without esophagitis: Secondary | ICD-10-CM | POA: Diagnosis not present

## 2015-08-25 LAB — POCT INR: INR: 3

## 2015-08-26 LAB — PROTIME-INR
INR: 3 — AB (ref 0.8–1.2)
Prothrombin Time: 30.1 s — ABNORMAL HIGH (ref 9.1–12.0)

## 2015-08-27 ENCOUNTER — Ambulatory Visit (INDEPENDENT_AMBULATORY_CARE_PROVIDER_SITE_OTHER): Payer: Medicare Other | Admitting: *Deleted

## 2015-08-27 NOTE — Progress Notes (Signed)
Pre visit review using our clinic review tool, if applicable. No additional management support is needed unless otherwise documented below in the visit note. 

## 2015-08-30 ENCOUNTER — Other Ambulatory Visit: Payer: Self-pay | Admitting: Cardiovascular Disease

## 2015-09-01 ENCOUNTER — Telehealth: Payer: Self-pay | Admitting: *Deleted

## 2015-09-01 NOTE — Telephone Encounter (Signed)
*  STAT* If patient is at the pharmacy, call can be transferred to refill team.   1. Which medications need to be refilled? (please list name of each medication and dose if known) Amiodarone   2. Which pharmacy/location (including street and city if local pharmacy) is medication to be sent to? [facility] in [place]   3. Do they need a 30 day or 90 day supply? 90 day   Pt has been getting 30 day but would like 90 or 30 with a few refills.

## 2015-09-02 ENCOUNTER — Telehealth: Payer: Self-pay | Admitting: *Deleted

## 2015-09-02 ENCOUNTER — Telehealth: Payer: Self-pay

## 2015-09-02 MED ORDER — CLARITHROMYCIN POWD: Status: DC

## 2015-09-02 MED ORDER — AMIODARONE HCL 200 MG PO TABS: 200.0000 mg | ORAL_TABLET | Freq: Every day | ORAL | Status: DC

## 2015-09-02 NOTE — Telephone Encounter (Signed)
Pharmacy advised  

## 2015-09-02 NOTE — Telephone Encounter (Signed)
Refill sent for amiodarone 90 day supply

## 2015-09-02 NOTE — Telephone Encounter (Signed)
Likely clarithromycin.  Okay to hold tamsulosin while on med.  Thanks.

## 2015-09-02 NOTE — Telephone Encounter (Signed)
pharmacy calling asking about a drug interaction GI is giving pt [place]  Treating H. pylori  If we can hold Inspra for 14 days just until they finish medication above  Please advise .

## 2015-09-02 NOTE — Telephone Encounter (Signed)
[facility] said possible drug interaction between [name] (given by GI dr for H Pylori) and tamsulosin(can increase tamsulosin level x 3). [facility] wants to know if can hold tamsulosin while pt is taking med to treat H pylori.Please advise.

## 2015-09-03 NOTE — Telephone Encounter (Signed)
Pharmacy calling again trying to see if can hold the medication below.  Please advise

## 2015-09-03 NOTE — Telephone Encounter (Signed)
Yes, he can hold Inspra for 14 days.

## 2015-09-03 NOTE — Telephone Encounter (Signed)
S/w Scott, pharmacist, who states pt recently prescribed abx for H pylori and the only abx he can afford is clarithromycin. Pharmacist reports interaction w/inspra and asking if pt can stop inspra x 14 days in order to take clarithromycin.  Forward to [facility] to advise.

## 2015-09-03 NOTE — Telephone Encounter (Signed)
Please advise 

## 2015-09-04 NOTE — Telephone Encounter (Signed)
S/w Nicki, pharmacist, w/Dr. Tyrell Antonio recommendations. Nicki states that pt presented to pharmacy yesterday and stated he would pay for tetracycline instead of waiting for answer regarding holding Inspra. She is appreciative of call with no further questions.

## 2015-09-25 ENCOUNTER — Other Ambulatory Visit: Payer: Self-pay | Admitting: Family Medicine

## 2015-09-25 DIAGNOSIS — I4891 Unspecified atrial fibrillation: Secondary | ICD-10-CM | POA: Diagnosis not present

## 2015-09-25 LAB — POCT INR: INR: 4.1

## 2015-09-26 LAB — PROTIME-INR
INR: 4.1 — AB (ref 0.8–1.2)
PROTHROMBIN TIME: 41.2 s — AB (ref 9.1–12.0)

## 2015-09-29 ENCOUNTER — Other Ambulatory Visit: Payer: Self-pay | Admitting: Family Medicine

## 2015-10-01 ENCOUNTER — Ambulatory Visit (INDEPENDENT_AMBULATORY_CARE_PROVIDER_SITE_OTHER): Payer: Medicare Other | Admitting: *Deleted

## 2015-10-01 NOTE — Progress Notes (Signed)
Pre visit review using our clinic review tool, if applicable. No additional management support is needed unless otherwise documented below in the visit note. 

## 2015-10-08 DIAGNOSIS — Z8619 Personal history of other infectious and parasitic diseases: Secondary | ICD-10-CM | POA: Diagnosis not present

## 2015-10-08 DIAGNOSIS — K219 Gastro-esophageal reflux disease without esophagitis: Secondary | ICD-10-CM | POA: Diagnosis not present

## 2015-10-08 DIAGNOSIS — R1013 Epigastric pain: Secondary | ICD-10-CM | POA: Diagnosis not present

## 2015-10-17 ENCOUNTER — Other Ambulatory Visit: Payer: Self-pay | Admitting: Cardiovascular Disease

## 2015-10-20 ENCOUNTER — Other Ambulatory Visit: Payer: Self-pay | Admitting: Cardiovascular Disease

## 2015-10-26 ENCOUNTER — Encounter: Payer: Self-pay | Admitting: Family Medicine

## 2015-10-26 DIAGNOSIS — A048 Other specified bacterial intestinal infections: Secondary | ICD-10-CM | POA: Insufficient documentation

## 2015-10-29 ENCOUNTER — Other Ambulatory Visit: Payer: Self-pay | Admitting: Family Medicine

## 2015-10-29 DIAGNOSIS — I4891 Unspecified atrial fibrillation: Secondary | ICD-10-CM | POA: Diagnosis not present

## 2015-10-29 LAB — POCT INR: INR: 2.6

## 2015-10-30 ENCOUNTER — Ambulatory Visit (INDEPENDENT_AMBULATORY_CARE_PROVIDER_SITE_OTHER): Payer: Medicare Other | Admitting: *Deleted

## 2015-10-30 LAB — PROTIME-INR
INR: 2.6 — AB (ref 0.8–1.2)
Prothrombin Time: 26.8 s — ABNORMAL HIGH (ref 9.1–12.0)

## 2015-10-30 NOTE — Progress Notes (Signed)
Pre visit review using our clinic review tool, if applicable. No additional management support is needed unless otherwise documented below in the visit note. 

## 2015-11-03 DIAGNOSIS — Z862 Personal history of diseases of the blood and blood-forming organs and certain disorders involving the immune mechanism: Secondary | ICD-10-CM | POA: Diagnosis not present

## 2015-11-10 DIAGNOSIS — H43812 Vitreous degeneration, left eye: Secondary | ICD-10-CM | POA: Diagnosis not present

## 2015-11-10 DIAGNOSIS — H353221 Exudative age-related macular degeneration, left eye, with active choroidal neovascularization: Secondary | ICD-10-CM | POA: Diagnosis not present

## 2015-11-17 ENCOUNTER — Encounter: Payer: Self-pay | Admitting: Cardiovascular Disease

## 2015-11-17 ENCOUNTER — Ambulatory Visit (INDEPENDENT_AMBULATORY_CARE_PROVIDER_SITE_OTHER): Payer: Medicare Other | Admitting: Cardiovascular Disease

## 2015-11-17 VITALS — BP 126/70 | HR 78 | Ht 71.0 in | Wt 185.5 lb

## 2015-11-17 DIAGNOSIS — I5022 Chronic systolic (congestive) heart failure: Secondary | ICD-10-CM

## 2015-11-17 DIAGNOSIS — I4819 Other persistent atrial fibrillation: Secondary | ICD-10-CM

## 2015-11-17 DIAGNOSIS — R0989 Other specified symptoms and signs involving the circulatory and respiratory systems: Secondary | ICD-10-CM

## 2015-11-17 DIAGNOSIS — I481 Persistent atrial fibrillation: Secondary | ICD-10-CM

## 2015-11-17 DIAGNOSIS — R195 Other fecal abnormalities: Secondary | ICD-10-CM | POA: Diagnosis not present

## 2015-11-17 DIAGNOSIS — D508 Other iron deficiency anemias: Secondary | ICD-10-CM | POA: Diagnosis not present

## 2015-11-17 NOTE — Assessment & Plan Note (Signed)
He is maintaining in sinus rhythm with amiodarone. I requested liver profile and TSH. Continue long-term anticoagulation with a target INR between 2 and 3.

## 2015-11-17 NOTE — Progress Notes (Signed)
HPI  This is a pleasant 82 -year-old male who is here today for a follow up visit. He has a h/o persistent atrial fibrillation and nonischemic cardiomyopathy.   His afib has been associated with worsening CHF in the past. He had multiple cardioversions. He has known history of mitral valve disease status post mitral valve repair in the 90s. He has no history of coronary artery disease. He is on coumadin for stroke prevention. He has been maintaining in sinus rhythm since he was placed on amiodarone. Most recent echo in March of 2015 showed: Ejection fraction of 35% to 40%, mild aortic stenosis, mitral valve repair with mild to moderate regurgitation, mild to moderate tricuspid regurgitation and pulmonary pressure of 48 mmHg.   he has been doing well from a cardiac standpoint with no chest pain or dyspnea. He was diagnosed with H. pylori and was treated for this. He has been taking his heart medications regularly with no reported side effects. He was noted to have bilateral carotid bruits today. No previous history of stroke.  Allergies  Allergen Reactions  . Atorvastatin Other (See Comments)    Other reaction(s): Unknown REACTION:muscular soreness  . Statins     Muscle aches on mult statins     Current Outpatient Prescriptions on File Prior to Visit  Medication Sig Dispense Refill  . amiodarone (PACERONE) 200 MG tablet Take 1 tablet (200 mg total) by mouth daily. 90 tablet 3  . benzonatate (TESSALON) 200 MG capsule Take 1 capsule (200 mg total) by mouth 3 (three) times daily as needed. 30 capsule 1  . cholecalciferol (VITAMIN D) 1000 UNITS tablet Take 1,000 Units by mouth daily.    . cyanocobalamin 100 MCG tablet Take 100 mcg by mouth daily.      . enalapril (VASOTEC) 5 MG tablet TAKE ONE TABLET BY MOUTH ONCE DAILY 90 tablet 3  . eplerenone (INSPRA) 25 MG tablet TAKE ONE TABLET BY MOUTH ONCE DAILY 30 tablet 3  . ferrous sulfate 325 (65 FE) MG tablet Take 325 mg by mouth daily with  breakfast.    . finasteride (PROSCAR) 5 MG tablet Take 1 tablet (5 mg total) by mouth daily. 90 tablet 3  . fish oil-omega-3 fatty acids 1000 MG capsule Take 1 g by mouth daily.    . furosemide (LASIX) 40 MG tablet Take 1 tablet (40 mg total) by mouth daily as needed. 30 tablet 3  . hydroxypropyl methylcellulose (ISOPTO TEARS) 2.5 % ophthalmic solution Place 1 drop into both eyes every morning.    . metoprolol succinate (TOPROL-XL) 50 MG 24 hr tablet TAKE ONE TABLET BY MOUTH ONCE DAILY TAKE  WITH  OR  IMMEDIATELY  FOLLOWING  MEAL. 30 tablet 3  . Multiple Vitamin (MULTIVITAMIN) tablet Take 1 tablet by mouth daily.      Marland Kitchen omeprazole (PRILOSEC) 20 MG capsule Take 1 capsule (20 mg total) by mouth daily. 90 capsule 3  . tamsulosin (FLOMAX) 0.4 MG CAPS capsule Take 1 capsule (0.4 mg total) by mouth daily. 90 capsule 3  . vitamin C (ASCORBIC ACID) 500 MG tablet Take 500 mg by mouth daily.    Marland Kitchen warfarin (COUMADIN) 4 MG tablet TAKE ONE TABLET BY MOUTH ONCE DAILY OR AS DIRECTED 30 tablet 12   No current facility-administered medications on file prior to visit.     Past Medical History  Diagnosis Date  . Coronary atherosclerosis of unspecified type of vessel, native or graft 2008    cardioversion. Echo mod LVH  EF 25%, Triv AR  Mild LAE, RAE 11/15/*11  . Long term (current) use of anticoagulants   . Atrial fibrillation ([place])   . Pure hypercholesterolemia   . Esophageal reflux   . Sprain of neck   . Dizziness and giddiness   . Iron deficiency anemia, unspecified   . Unspecified sleep apnea   . Left heart failure ([place])   . Personal history of other diseases of digestive system   . Diaphragmatic hernia without mention of obstruction or gangrene   . Unspecified hemorrhoids without mention of complication   . Diverticulosis of colon (without mention of hemorrhage)   . Encounter for long-term (current) use of other medications   . Encounter for therapeutic drug monitoring   . Personal history of  peptic ulcer disease   . Gastritis 12/18/08    EGD, 3 clips remaining (Dr. Vira Agar)  . Upper GI bleed 7/15-7/20/09    Hosp.  Elevated INR, coumadin stopped sinusitis  . Hypertension   . CHF (congestive heart failure) ([place])   . Blind right eye   . Macular degeneration      Past Surgical History  Procedure Laterality Date  . Mitral valve repair  1995  . Cholecystectomy    . Partial gastrectomy    . Appendectomy    . Cataract extraction  10/13/08    left  . Mitral valve annuloplasty  1996  . Cardioversion  12/17/2011    Procedure: CARDIOVERSION;  Surgeon: Hillary Bow, MD;  Location: [facility];  Service: Cardiovascular;  Laterality: N/A;  . Cardiac catheterization  1996    [facility]  . Cardiac catheterization    . Cardiac catheterization       Family History  Problem Relation Age of Onset  . Cancer Father     lung  . Colon cancer Neg Hx   . Prostate cancer Neg Hx      Social History   Social History  . Marital Status: Married    Spouse Name: N/A  . Number of Children: 3  . 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 Smoking status: Never Smoker   . Smokeless tobacco: Never Used  . Alcohol Use: Yes     Comment: socially, beer   . Drug Use: No  . Sexual Activity: Not on file   Other Topics Concern  . Not on file   Social History Narrative   Married 20+ years   3 kids     PHYSICAL EXAM   BP 126/70 mmHg  Pulse 78  Ht 5\' 11"  (1.803 m)  Wt 185 lb 8 oz (84.142 kg)  BMI 25.88 kg/m2 Constitutional: He is oriented to person, place, and time. He appears well-developed and well-nourished. No distress.  HENT: No nasal discharge.  Head: Normocephalic and atraumatic.  Eyes: Pupils are equal and round. Right eye exhibits no discharge. Left eye exhibits no discharge.  Neck: Normal range of motion. Neck supple. No JVD present. No thyromegaly present. Bilateral carotid bruits Cardiovascular: Normal  rate, irregular rhythm, normal heart sounds and. Exam reveals no gallop and no friction rub. There is a 2/6 systolic ejection murmur in the aortic area which is early peaking.  Pulmonary/Chest: Effort normal and breath sounds normal. No stridor. No respiratory distress. He has no wheezes. He has no rales. He exhibits no tenderness.  Abdominal: Soft. Bowel sounds are normal. He exhibits no distension. There is  no tenderness. There is no rebound and no guarding.  Musculoskeletal: Normal range of motion. He exhibits no edema and no tenderness.  Neurological: He is alert and oriented to person, place, and time. Coordination normal.  Skin: Skin is warm and dry. No rash noted. He is not diaphoretic. No erythema. No pallor.  Psychiatric: He has a normal mood and affect. His behavior is normal. Judgment and thought content normal.       EKG: Sinus  Rhythm  -First degree A-V block  PRi = 224 Low voltage in limb leads.   -Nonspecific QRS widening.   -Poor R-wave progression -may be secondary to pulmonary disease   consider old anterior infarct.   -  Nonspecific T-abnormality.   ASSESSMENT AND PLAN

## 2015-11-17 NOTE — Assessment & Plan Note (Signed)
This seems to be new. I requested carotid Doppler.

## 2015-11-17 NOTE — Patient Instructions (Signed)
Medication Instructions:  Your physician recommends that you continue on your current medications as directed. Please refer to the Current Medication list given to you today.   Labwork: BMET, TSH, liver profile at [facility]  Testing/Procedures: Your physician has requested that you have a carotid duplex. This test is an ultrasound of the carotid arteries in your neck. It looks at blood flow through these arteries that supply the brain with blood. Allow one hour for this exam. There are no restrictions or special instructions.    Follow-Up: Your physician wants you to follow-up in: six months with Dr. Fletcher Anon.  You will receive a reminder letter in the mail two months in advance. If you don't receive a letter, please call our office to schedule the follow-up appointment.   Any Other Special Instructions Will Be Listed Below (If Applicable).     If you need a refill on your cardiac medications before your next appointment, please call your pharmacy.

## 2015-11-17 NOTE — Assessment & Plan Note (Signed)
He appears to be euvolemic and currently on optimal medical therapy. I requested basic metabolic profile.

## 2015-11-24 ENCOUNTER — Ambulatory Visit: Payer: Medicare Other

## 2015-11-24 ENCOUNTER — Other Ambulatory Visit: Payer: Self-pay | Admitting: Family Medicine

## 2015-11-24 DIAGNOSIS — R0989 Other specified symptoms and signs involving the circulatory and respiratory systems: Secondary | ICD-10-CM | POA: Diagnosis not present

## 2015-11-24 DIAGNOSIS — I6523 Occlusion and stenosis of bilateral carotid arteries: Secondary | ICD-10-CM | POA: Diagnosis not present

## 2015-11-24 NOTE — Telephone Encounter (Signed)
Electronic refill request. Last Filled:    30 tablet 12 11/01/2014  Please advise.

## 2015-11-26 ENCOUNTER — Other Ambulatory Visit: Payer: Self-pay | Admitting: Cardiovascular Disease

## 2015-11-26 DIAGNOSIS — I481 Persistent atrial fibrillation: Secondary | ICD-10-CM | POA: Diagnosis not present

## 2015-11-27 LAB — HEPATIC FUNCTION PANEL
ALT: 19 IU/L (ref 0–44)
AST: 26 IU/L (ref 0–40)
Albumin: 4.1 g/dL (ref 3.5–4.7)
Alkaline Phosphatase: 49 IU/L (ref 39–117)
BILIRUBIN TOTAL: 0.4 mg/dL (ref 0.0–1.2)
BILIRUBIN, DIRECT: 0.1 mg/dL (ref 0.00–0.40)
Total Protein: 6.3 g/dL (ref 6.0–8.5)

## 2015-11-27 LAB — BASIC METABOLIC PANEL
BUN / CREAT RATIO: 10 (ref 10–22)
BUN: 10 mg/dL (ref 8–27)
CO2: 24 mmol/L (ref 18–29)
CREATININE: 1 mg/dL (ref 0.76–1.27)
Calcium: 8.4 mg/dL — ABNORMAL LOW (ref 8.6–10.2)
Chloride: 94 mmol/L — ABNORMAL LOW (ref 96–106)
GFR calc Af Amer: 82 mL/min/{1.73_m2} (ref >59)
GFR, EST NON AFRICAN AMERICAN: 71 mL/min/{1.73_m2} (ref >59)
GLUCOSE: 77 mg/dL (ref 65–99)
POTASSIUM: 5.1 mmol/L (ref 3.5–5.2)
SODIUM: 135 mmol/L (ref 134–144)

## 2015-11-27 LAB — TSH: TSH: 0.943 u[IU]/mL (ref 0.450–4.500)

## 2015-12-05 ENCOUNTER — Other Ambulatory Visit: Payer: Self-pay

## 2015-12-05 DIAGNOSIS — I6523 Occlusion and stenosis of bilateral carotid arteries: Secondary | ICD-10-CM

## 2015-12-29 ENCOUNTER — Other Ambulatory Visit: Payer: Self-pay | Admitting: Family Medicine

## 2015-12-29 DIAGNOSIS — I4891 Unspecified atrial fibrillation: Secondary | ICD-10-CM | POA: Diagnosis not present

## 2015-12-29 LAB — POCT INR: INR: 2.6

## 2015-12-30 LAB — PROTIME-INR
INR: 2.6 — ABNORMAL HIGH (ref 0.8–1.2)
Prothrombin Time: 26.9 s — ABNORMAL HIGH (ref 9.1–12.0)

## 2015-12-31 ENCOUNTER — Ambulatory Visit (INDEPENDENT_AMBULATORY_CARE_PROVIDER_SITE_OTHER): Payer: Medicare Other | Admitting: *Deleted

## 2015-12-31 NOTE — Progress Notes (Signed)
Pre visit review using our clinic review tool, if applicable. No additional management support is needed unless otherwise documented below in the visit note. 

## 2016-01-12 DIAGNOSIS — H353221 Exudative age-related macular degeneration, left eye, with active choroidal neovascularization: Secondary | ICD-10-CM | POA: Diagnosis not present

## 2016-02-10 ENCOUNTER — Telehealth: Payer: Self-pay | Admitting: Family Medicine

## 2016-02-10 NOTE — Telephone Encounter (Signed)
Sent.  Schedule CPE.  Thanks.  

## 2016-02-10 NOTE — Telephone Encounter (Signed)
Patient advised.

## 2016-02-10 NOTE — Telephone Encounter (Signed)
Electronic refill request. Last Filled:    90 tablet 3 01/30/2015  Last CPE 01/30/15.  No upcoming appts scheduled.  Please advise.

## 2016-02-12 ENCOUNTER — Other Ambulatory Visit: Payer: Self-pay | Admitting: Family Medicine

## 2016-02-13 ENCOUNTER — Other Ambulatory Visit: Payer: Self-pay | Admitting: Family Medicine

## 2016-02-13 NOTE — Telephone Encounter (Signed)
[name] left v/m requesting status of finesteride refill; spoke with Marlowe Kays at [address] and ready for pickup. Cannot leave message due to mailbox not being set up.

## 2016-02-16 ENCOUNTER — Other Ambulatory Visit: Payer: Self-pay | Admitting: Family Medicine

## 2016-02-16 NOTE — Telephone Encounter (Signed)
Mrs Tin left v/m requesting status of finasteride; unable to reach Mrs Radde but I spoke with pt and let him know Marlowe Kays at [facility] said rx ready for pick up. Pt voiced understanding.

## 2016-02-19 ENCOUNTER — Other Ambulatory Visit: Payer: Self-pay | Admitting: Family Medicine

## 2016-02-20 ENCOUNTER — Other Ambulatory Visit: Payer: Self-pay | Admitting: *Deleted

## 2016-02-23 ENCOUNTER — Other Ambulatory Visit: Payer: Self-pay | Admitting: Family Medicine

## 2016-02-23 DIAGNOSIS — I4891 Unspecified atrial fibrillation: Secondary | ICD-10-CM | POA: Diagnosis not present

## 2016-02-23 LAB — POCT INR: INR: 2.4

## 2016-02-24 ENCOUNTER — Ambulatory Visit (INDEPENDENT_AMBULATORY_CARE_PROVIDER_SITE_OTHER): Payer: Medicare Other | Admitting: *Deleted

## 2016-02-24 LAB — PROTIME-INR
INR: 2.4 — ABNORMAL HIGH (ref 0.8–1.2)
PROTHROMBIN TIME: 24.8 s — AB (ref 9.1–12.0)

## 2016-02-25 MED ORDER — WARFARIN SODIUM 4 MG PO TABS: ORAL_TABLET | ORAL | Status: DC

## 2016-02-25 NOTE — Progress Notes (Signed)
Pre visit review using our clinic review tool, if applicable. No additional management support is needed unless otherwise documented below in the visit note. 

## 2016-02-27 ENCOUNTER — Telehealth: Payer: Self-pay | Admitting: Cardiovascular Disease

## 2016-02-27 MED ORDER — EPLERENONE 25 MG PO TABS: 25.0000 mg | ORAL_TABLET | Freq: Every day | ORAL | Status: DC

## 2016-02-27 NOTE — Telephone Encounter (Signed)
Refill sent for inspra 25 mg

## 2016-02-27 NOTE — Telephone Encounter (Signed)
*  STAT* If patient is at the pharmacy, call can be transferred to refill team.   1. Which medications need to be refilled? (please list name of each medication and dose if known) eplerenone (INSPRA) 25 MG tablet   2. Which pharmacy/location (including street and city if local pharmacy) is medication to be sent to? [facility]   3. Do they need a 30 day or 90 day supply? 30 day

## 2016-03-09 ENCOUNTER — Emergency Department: Payer: Medicare Other

## 2016-03-09 ENCOUNTER — Emergency Department
Admission: EM | Admit: 2016-03-09 | Discharge: 2016-03-09 | Disposition: A | Discharge status: Home / Self Care | Payer: Medicare Other | Attending: Emergency Medicine | Admitting: Emergency Medicine

## 2016-03-09 ENCOUNTER — Encounter: Payer: Self-pay | Admitting: Emergency Medicine

## 2016-03-09 DIAGNOSIS — I110 Hypertensive heart disease with heart failure: Secondary | ICD-10-CM | POA: Diagnosis not present

## 2016-03-09 DIAGNOSIS — R55 Syncope and collapse: Secondary | ICD-10-CM

## 2016-03-09 DIAGNOSIS — I951 Orthostatic hypotension: Secondary | ICD-10-CM | POA: Diagnosis not present

## 2016-03-09 DIAGNOSIS — Z79899 Other long term (current) drug therapy: Secondary | ICD-10-CM | POA: Diagnosis not present

## 2016-03-09 DIAGNOSIS — R42 Dizziness and giddiness: Secondary | ICD-10-CM | POA: Diagnosis present

## 2016-03-09 DIAGNOSIS — I509 Heart failure, unspecified: Secondary | ICD-10-CM | POA: Diagnosis not present

## 2016-03-09 LAB — TROPONIN I: Troponin I: <0.03 ng/mL (ref <0.031)

## 2016-03-09 LAB — BASIC METABOLIC PANEL
ANION GAP: 8 (ref 5–15)
BUN: 10 mg/dL (ref 6–20)
CALCIUM: 8.7 mg/dL — AB (ref 8.9–10.3)
CO2: 25 mmol/L (ref 22–32)
Chloride: 97 mmol/L — ABNORMAL LOW (ref 101–111)
Creatinine, Ser: 1.25 mg/dL — ABNORMAL HIGH (ref 0.61–1.24)
GFR calc Af Amer: >60 mL/min (ref >60)
GFR calc non Af Amer: 53 mL/min — ABNORMAL LOW (ref >60)
GLUCOSE: 108 mg/dL — AB (ref 65–99)
Potassium: 4.2 mmol/L (ref 3.5–5.1)
Sodium: 130 mmol/L — ABNORMAL LOW (ref 135–145)

## 2016-03-09 LAB — CBC WITH DIFFERENTIAL/PLATELET
Basophils Absolute: 0.1 10*3/uL (ref 0–0.1)
Basophils Relative: 1 %
EOS ABS: 0 10*3/uL (ref 0–0.7)
Eosinophils Relative: 1 %
HEMATOCRIT: 31.9 % — AB (ref 40.0–52.0)
HEMOGLOBIN: 10.2 g/dL — AB (ref 13.0–18.0)
LYMPHS ABS: 1 10*3/uL (ref 1.0–3.6)
Lymphocytes Relative: 23 %
MCH: 23.5 pg — AB (ref 26.0–34.0)
MCHC: 31.9 g/dL — AB (ref 32.0–36.0)
MCV: 73.8 fL — ABNORMAL LOW (ref 80.0–100.0)
MONO ABS: 0.4 10*3/uL (ref 0.2–1.0)
MONOS PCT: 11 %
NEUTROS PCT: 64 %
Neutro Abs: 2.7 10*3/uL (ref 1.4–6.5)
Platelets: 251 10*3/uL (ref 150–440)
RBC: 4.32 MIL/uL — ABNORMAL LOW (ref 4.40–5.90)
RDW: 16.1 % — ABNORMAL HIGH (ref 11.5–14.5)
WBC: 4.2 10*3/uL (ref 3.8–10.6)

## 2016-03-09 LAB — PROTIME-INR
INR: 2.21
PROTHROMBIN TIME: 24.3 s — AB (ref 11.4–15.0)

## 2016-03-09 NOTE — ED Notes (Addendum)
Ems s/p syncope at [place] [facility] [place]. Per ems pt was out and then confused when waking up. A/O now.  50/30 at scene, 131/70 now. Incontinent of stool and urine at scene.

## 2016-03-09 NOTE — Discharge Instructions (Signed)
You were evaluated after passing out, and was found to have low blood pressure upon standing consistent with dehydration and you're given IV fluids here in the emergency department.  Return to emergency department for any worsening condition including any chest pain or trouble breathing, dizziness or passing out, fever, confusion altered mental status, or any other symptoms concerning to you.   Orthostatic Hypotension Orthostatic hypotension is a sudden drop in blood pressure. It happens when you quickly stand up from a seated or lying position. You may feel dizzy or light-headed. This can last for just a few seconds or for up to a few minutes. It is usually not a serious problem. However, if this happens frequently or gets worse, it can be a sign of something more serious. CAUSES  Different things can cause orthostatic hypotension, including:   Loss of body fluids (dehydration).  Medicines that lower blood pressure.  Sudden changes in posture, such as standing up quickly after you have been sitting or lying down.  Taking too much of your medicine. SIGNS AND SYMPTOMS   Light-headedness or dizziness.   Fainting or near-fainting.   A fast heart rate.   Weakness.   Feeling tired (fatigue).  DIAGNOSIS  Your health care provider may do several things to help diagnose your condition and identify the cause. These may include:   Taking a medical history and doing a physical exam.  Checking your blood pressure. Your health care provider will check your blood pressure when you are:  Lying down.  Sitting.  Standing.  Using tilt table testing. In this test, you lie down on a table that moves from a lying position to a standing position. You will be strapped onto the table. This test monitors your blood pressure and heart rate when you are in different positions. TREATMENT  Treatment will vary depending on the cause. Possible treatments include:   Changing the dosage of your  medicines.  Wearing compression stockings on your lower legs.  Standing up slowly after sitting or lying down.  Eating more salt.  Eating frequent, small meals.  In some cases, getting IV fluids.  Taking medicine to enhance fluid retention. HOME CARE INSTRUCTIONS  Only take over-the-counter or prescription medicines as directed by your health care provider.  Follow your health care provider's instructions for changing the dosage of your current medicines.  Do not stop or adjust your medicine on your own.  Stand up slowly after sitting or lying down. This allows your body to adjust to the different position.  Wear compression stockings as directed.  Eat extra salt as directed.  Do not add extra salt to your diet unless directed to by your health care provider.  Eat frequent, small meals.  Avoid standing suddenly after eating.  Avoid hot showers or excessive heat as directed by your health care provider.  Keep all follow-up appointments. SEEK MEDICAL CARE IF:  You continue to feel dizzy or light-headed after standing.  You feel groggy or confused.  You feel cold, clammy, or sick to your stomach (nauseous).  You have blurred vision.  You feel short of breath. SEEK IMMEDIATE MEDICAL CARE IF:   You faint after standing.  You have chest pain.  You have difficulty breathing.   You lose feeling or movement in your arms or legs.   You have slurred speech or difficulty talking, or you are unable to talk.  MAKE SURE YOU:   Understand these instructions.  Will watch your condition.  Will get help  right away if you are not doing well or get worse.   This information is not intended to replace advice given to you by your health care provider. Make sure you discuss any questions you have with your health care provider.   Document Released: 09/10/2002 Document Revised: 09/25/2013 Document Reviewed: 07/13/2013 Elsevier Interactive Patient Education 2016  [employer].  Syncope Syncope is a medical term for fainting or passing out. This means you lose consciousness and drop to the ground. People are generally unconscious for less than 5 minutes. You may have some muscle twitches for up to 15 seconds before waking up and returning to normal. Syncope occurs more often in older adults, but it can happen to anyone. While most causes of syncope are not dangerous, syncope can be a sign of a serious medical problem. It is important to seek medical care.  CAUSES  Syncope is caused by a sudden drop in blood flow to the brain. The specific cause is often not determined. Factors that can bring on syncope include:  Taking medicines that lower blood pressure.  Sudden changes in posture, such as standing up quickly.  Taking more medicine than prescribed.  Standing in one place for too long.  Seizure disorders.  Dehydration and excessive exposure to heat.  Low blood sugar (hypoglycemia).  Straining to have a bowel movement.  Heart disease, irregular heartbeat, or other circulatory problems.  Fear, emotional distress, seeing blood, or severe pain. SYMPTOMS  Right before fainting, you may:  Feel dizzy or light-headed.  Feel nauseous.  See all white or all black in your field of vision.  Have cold, clammy skin. DIAGNOSIS  Your health care provider will ask about your symptoms, perform a physical exam, and perform an electrocardiogram (ECG) to record the electrical activity of your heart. Your health care provider may also perform other heart or blood tests to determine the cause of your syncope which may include:  Transthoracic echocardiogram (TTE). During echocardiography, sound waves are used to evaluate how blood flows through your heart.  Transesophageal echocardiogram (TEE).  Cardiac monitoring. This allows your health care provider to monitor your heart rate and rhythm in real time.  Holter monitor. This is a portable device that  records your heartbeat and can help diagnose heart arrhythmias. It allows your health care provider to track your heart activity for several days, if needed.  Stress tests by exercise or by giving medicine that makes the heart beat faster. TREATMENT  In most cases, no treatment is needed. Depending on the cause of your syncope, your health care provider may recommend changing or stopping some of your medicines. HOME CARE INSTRUCTIONS  Have someone stay with you until you feel stable.  Do not drive, use machinery, or play sports until your health care provider says it is okay.  Keep all follow-up appointments as directed by your health care provider.  Lie down right away if you start feeling like you might faint. Breathe deeply and steadily. Wait until all the symptoms have passed.  Drink enough fluids to keep your urine clear or pale yellow.  If you are taking blood pressure or heart medicine, get up slowly and take several minutes to sit and then stand. This can reduce dizziness. SEEK IMMEDIATE MEDICAL CARE IF:   You have a severe headache.  You have unusual pain in the chest, abdomen, or back.  You are bleeding from your mouth or rectum, or you have black or tarry stool.  You have an irregular  or very fast heartbeat.  You have pain with breathing.  You have repeated fainting or seizure-like jerking during an episode.  You faint when sitting or lying down.  You have confusion.  You have trouble walking.  You have severe weakness.  You have vision problems. If you fainted, call your local emergency services (911 in [place] Do not drive yourself to the hospital.    This information is not intended to replace advice given to you by your health care provider. Make sure you discuss any questions you have with your health care provider.   Document Released: 09/20/2005 Document Revised: 02/04/2015 Document Reviewed: 11/19/2011 Elsevier Interactive Patient Education [employer]

## 2016-03-09 NOTE — ED Provider Notes (Signed)
[facility] Emergency Department Provider Note   ____________________________________________  Time seen: Approximately 3 PM I have reviewed the triage vital signs and the triage nursing note.  HISTORY  Chief Complaint Loss of Consciousness   Historian Patient  HPI [name] is a 82 y.o. male brought in by EMS for passing out at [facility]. He states that he was feeling lightheaded and dizzy and then passed out. He is denying traumatic injury, but apparently he was quite confused when EMS got there. He also had a low blood pressure of systolic blood pressure in the 50s. Patient denies recent fluid losses, fever, vomiting, or diarrhea. He states he's had some mild sinus congestion without coughing or sputum production.  He has a history of CABG but no ongoing chest pain. He has a history of intermittent A. fib. He is taking Coumadin.  Denies any chest pain, palpitations or trouble breathing.  He feels somewhat better now.    Past Medical History  Diagnosis Date  . Coronary atherosclerosis of unspecified type of vessel, native or graft 2008    cardioversion. Echo mod LVH EF 25%, Triv AR  Mild LAE, RAE 11/15/*11  . Long term (current) use of anticoagulants   . Atrial fibrillation ([place])   . Pure hypercholesterolemia   . Esophageal reflux   . Sprain of neck   . Dizziness and giddiness   . Iron deficiency anemia, unspecified   . Unspecified sleep apnea   . Left heart failure ([place])   . Personal history of other diseases of digestive system   . Diaphragmatic hernia without mention of obstruction or gangrene   . Unspecified hemorrhoids without mention of complication   . Diverticulosis of colon (without mention of hemorrhage)   . Encounter for long-term (current) use of other medications   . Encounter for therapeutic drug monitoring   . Personal history of peptic ulcer disease   . Gastritis 12/18/08    EGD, 3 clips remaining (Dr. Vira Agar)  . Upper  GI bleed 7/15-7/20/09    Hosp.  Elevated INR, coumadin stopped sinusitis  . Hypertension   . CHF (congestive heart failure) ([place])   . Blind right eye   . Macular degeneration     Patient Active Problem List   Diagnosis Date Noted  . Bilateral carotid bruits 11/17/2015  . H. pylori infection 10/26/2015  . Persistent atrial fibrillation ([place]) 04/28/2015  . Medicare annual wellness visit, initial 01/31/2015  . Advance care planning 01/31/2015  . Long term current use of anticoagulant therapy 01/30/2015  . Abnormal TSH 02/16/2014  . Chronic systolic heart failure ([place]) 08/16/2013  . Fatigue 09/08/2012  . Cardiomyopathy, dilated, nonischemic ([place]) 12/19/2011  . Hypertension 01/20/2011  . ROSACEA 11/12/2009  . BPH with obstruction/lower urinary tract symptoms 09/03/2009  . MITRAL VALVE REPLACEMENT, HX OF 06/11/2009  . OTHER AND UNSPECIFIED MITRAL VALVE DISEASES 03/04/2009  . PEPTIC ULCER DISEASE, HX OF 02/27/2009  . ANEMIA, IRON DEFICIENCY 04/09/2008  . GERD 04/09/2008  . HYPERCHOLESTEROLEMIA 04/08/2008  . LEFT VENTRICULAR FAILURE 04/08/2008  . HEMORRHOIDS 04/08/2008  . HIATAL HERNIA 04/08/2008  . DIVERTICULOSIS, COLON 04/08/2008  . SLEEP APNEA 04/08/2008  . GASTROINTESTINAL HEMORRHAGE, HX OF 04/08/2008  . Atrial fibrillation ([place]) 02/01/2007    Past Surgical History  Procedure Laterality Date  . Mitral valve repair  1995  . Cholecystectomy    . Partial gastrectomy    . Appendectomy    . Cataract extraction  10/13/08    left  . Mitral valve  annuloplasty  1996  . Cardioversion  12/17/2011    Procedure: CARDIOVERSION;  Surgeon: Hillary Bow, MD;  Location: [facility];  Service: Cardiovascular;  Laterality: N/A;  . Cardiac catheterization  1996    [facility]  . Cardiac catheterization    . Cardiac catheterization      Current Outpatient Rx  Name  Route  Sig  Dispense  Refill  . amiodarone (PACERONE) 200 MG tablet   Oral   Take 1 tablet (200 mg total) by mouth daily.   90  tablet   3   . benzonatate (TESSALON) 200 MG capsule   Oral   Take 1 capsule (200 mg total) by mouth 3 (three) times daily as needed.   30 capsule   1   . cholecalciferol (VITAMIN D) 1000 UNITS tablet   Oral   Take 1,000 Units by mouth daily.         . cyanocobalamin 100 MCG tablet   Oral   Take 100 mcg by mouth daily.           . enalapril (VASOTEC) 5 MG tablet      TAKE ONE TABLET BY MOUTH ONCE DAILY   90 tablet   3   . eplerenone (INSPRA) 25 MG tablet   Oral   Take 1 tablet (25 mg total) by mouth daily.   30 tablet   3   . ferrous sulfate 325 (65 FE) MG tablet   Oral   Take 325 mg by mouth daily with breakfast.         . finasteride (PROSCAR) 5 MG tablet      TAKE ONE TABLET BY MOUTH ONCE DAILY   90 tablet   0   . fish oil-omega-3 fatty acids 1000 MG capsule   Oral   Take 1 g by mouth daily.         . furosemide (LASIX) 40 MG tablet   Oral   Take 1 tablet (40 mg total) by mouth daily as needed.   30 tablet   3   . hydroxypropyl methylcellulose (ISOPTO TEARS) 2.5 % ophthalmic solution   Both Eyes   Place 1 drop into both eyes every morning.         . metoprolol succinate (TOPROL-XL) 50 MG 24 hr tablet      TAKE ONE TABLET BY MOUTH ONCE DAILY WITH MEALS OR  IMMEDIATELY  FOLLOWING  MEAL   30 tablet   0     PATIENT MUST MAKE APPOINTMENT FOR PHYSICAL EXAM PR ...   . Multiple Vitamin (MULTIVITAMIN) tablet   Oral   Take 1 tablet by mouth daily.           Marland Kitchen omeprazole (PRILOSEC) 20 MG capsule   Oral   Take 1 capsule (20 mg total) by mouth daily.   90 capsule   3   . tamsulosin (FLOMAX) 0.4 MG CAPS capsule      TAKE ONE CAPSULE BY MOUTH ONCE DAILY   90 capsule   0   . vitamin C (ASCORBIC ACID) 500 MG tablet   Oral   Take 500 mg by mouth daily.         Marland Kitchen warfarin (COUMADIN) 4 MG tablet      Take as directed by anti-coagulation clinic.   60 tablet   0     90 day supply     Allergies Atorvastatin and Statins  Family  History  Problem Relation Age of Onset  . Cancer Father  lung  . Colon cancer Neg Hx   . Prostate cancer Neg Hx     Social History Social History  Substance Use Topics  . Smoking status: Never Smoker   . Smokeless tobacco: Never Used  . Alcohol Use: Yes     Comment: socially, beer     Review of Systems  Constitutional: Negative for fever. Eyes: Negative for visual changes. ENT: Negative for sore throat. Cardiovascular: Negative for chest pain. Respiratory: Negative for shortness of breath. Gastrointestinal: Negative for abdominal pain, vomiting and diarrhea. Genitourinary: Negative for dysuria. Musculoskeletal: Negative for back pain. Skin: Negative for rash. Neurological: Negative for headache. 10 point Review of Systems otherwise negative ____________________________________________   PHYSICAL EXAM:  VITAL SIGNS: ED Triage Vitals  Enc Vitals Group     BP 03/09/16 1501 123/59 mmHg     Pulse Rate 03/09/16 1501 64     Resp --      Temp 03/09/16 1501 97.8 F (36.6 C)     Temp Source 03/09/16 1501 Oral     SpO2 03/09/16 1501 96 %     Weight 03/09/16 1501 185 lb (83.915 kg)     Height 03/09/16 1501 5\' 11"  (1.803 m)     Head Cir --      Peak Flow --      Pain Score --      Pain Loc --      Pain Edu? --      Excl. in [place]? --      Constitutional: Alert and oriented. Well appearing and in no distress. HEENT   Head: Normocephalic and atraumatic.      Eyes: Conjunctivae are normal. PERRL. Normal extraocular movements.      Ears:         Nose: No congestion/rhinnorhea.   Mouth/Throat: Mucous membranes are moist.   Neck: No stridor.  Nontender cervical spine to palpation or range of motion. Cardiovascular/Chest: Normal rate, regular rhythm.  No murmurs, rubs, or gallops. Respiratory: Normal respiratory effort without tachypnea nor retractions. Breath sounds are clear and equal bilaterally. No wheezes/rales/rhonchi. Gastrointestinal: Soft. No  distention, no guarding, no rebound. Nontender.   Genitourinary/rectal:Deferred Musculoskeletal: Nontender with normal range of motion in all extremities. No joint effusions.  No lower extremity tenderness.  No edema. Neurologic:  Normal speech and language. No gross or focal neurologic deficits are appreciated. Skin:  Skin is warm, dry and intact. No rash noted. Psychiatric: Mood and affect are normal. Speech and behavior are normal. Patient exhibits appropriate insight and judgment.  ____________________________________________   EKG I, Lisa Roca, MD, the attending physician have personally viewed and interpreted all ECGs.  65 bpm undetermined rhythm irregularly irregular, likely A. fib. Nonspecific T wave. ____________________________________________  LABS (pertinent positives/negatives)  Labs Reviewed  BASIC METABOLIC PANEL  TROPONIN I  CBC WITH DIFFERENTIAL/PLATELET  PROTIME-INR  URINALYSIS COMPLETEWITH MICROSCOPIC ([facility] ONLY)    ____________________________________________  RADIOLOGY All Xrays were viewed by me. Imaging interpreted by Radiologist.  Chest x-ray: Pending  CT head without contrast: Pending __________________________________________  PROCEDURES  Procedure(s) performed: None  Critical Care performed: None  ____________________________________________   ED COURSE / ASSESSMENT AND PLAN  Pertinent labs & imaging results that were available during my care of the patient were reviewed by me and considered in my medical decision making (see chart for details).   Patient gave a history that sounds somewhat like orthostatic hypotension, rather than cardiac syncope.  His orthostatics here to confirm orthostatic hypotension and he will be given IV  fluids and have his orthostatics recheck.  He's not providing any history that would explain why he's had any fluid losses.  He is not having any cardiac symptoms.  From a trauma perspective, he is not  complaining of any pain or injury, although it sounds like per bystanders he did strike his head and had some confusion after the event and so I am going to CT his head as he is also on Coumadin.  If the source is found to be orthostatic hypotension with otherwise reassuring exam and patient is improved, is possible he may be discharged home.  Patient care transferred to Dr. Archie Balboa at shift change 3:30 PM. Labs, chest x-ray, urinalysis, head CT, and repeat orthostatics are all pending.   CONSULTATIONS:   None  Patient / Family / Caregiver informed of clinical course, medical decision-making process, and agree with plan.     ___________________________________________   FINAL CLINICAL IMPRESSION(S) / ED DIAGNOSES   Final diagnoses:  Orthostatic hypotension  Syncope, unspecified syncope type              Note: This dictation was prepared with Dragon dictation. Any transcriptional errors that result from this process are unintentional   Lisa Roca, MD 03/09/16 1534

## 2016-03-11 ENCOUNTER — Telehealth: Payer: Self-pay | Admitting: Family Medicine

## 2016-03-11 NOTE — Telephone Encounter (Signed)
Per [facility], last vist 01/2015. Just seen at ED and needs 30 min office visit.

## 2016-03-15 DIAGNOSIS — H353221 Exudative age-related macular degeneration, left eye, with active choroidal neovascularization: Secondary | ICD-10-CM | POA: Diagnosis not present

## 2016-03-22 ENCOUNTER — Other Ambulatory Visit: Payer: Self-pay | Admitting: Family Medicine

## 2016-03-30 ENCOUNTER — Other Ambulatory Visit: Payer: Self-pay | Admitting: Family Medicine

## 2016-03-30 DIAGNOSIS — I4891 Unspecified atrial fibrillation: Secondary | ICD-10-CM | POA: Diagnosis not present

## 2016-03-31 LAB — PROTIME-INR
INR: 2.9 — ABNORMAL HIGH (ref 0.8–1.2)
Prothrombin Time: 29.8 s — ABNORMAL HIGH (ref 9.1–12.0)

## 2016-04-01 ENCOUNTER — Telehealth: Payer: Self-pay | Admitting: Family Medicine

## 2016-04-01 NOTE — Telephone Encounter (Signed)
Please see what his questions are.  Thanks.

## 2016-04-01 NOTE — Telephone Encounter (Signed)
Patient says he is scheduled at the dentist at 10 o'clock Monday morning.  I reiterated your instructions to have INR done prior to dental visit.  Patient says he normally gets his INR's done at [facility] near his home and they will not have the results prior to his dental appt.  I suggested that he could come here for a reading prior to the dental visit.  After much ado, he agreed to do that but begrudgingly.  It was explained that after he has been off the Coumadin for 5 days, if his dentist will do the procedure, that is fine, but Dr. Josefine Class instructions are to have INR done previous before he would approve of the extraction.  Lab appt scheduled.

## 2016-04-01 NOTE — Telephone Encounter (Signed)
Patient is in Dr.Dodd's office.  Patient has been off his blood thinner for two days.  Dr.Dodd would like to have permission to extract a tooth.  Please fax approval to fax [phone].

## 2016-04-01 NOTE — Telephone Encounter (Signed)
I just got his INR, 2.9.  I wouldn't pull the tooth with that level unless it was emergent.  I would suggest 5 days off from coumadin with INR check.   Thanks.

## 2016-04-01 NOTE — Telephone Encounter (Signed)
Pt called he has ? About his PT/INR and going to the dentist Best number [phone]

## 2016-04-01 NOTE — Telephone Encounter (Signed)
I notified Tiffany of Dr.Duncan's comments and faxed her a copy of this phone note.

## 2016-04-05 ENCOUNTER — Other Ambulatory Visit (INDEPENDENT_AMBULATORY_CARE_PROVIDER_SITE_OTHER): Payer: Medicare Other

## 2016-04-05 DIAGNOSIS — I4891 Unspecified atrial fibrillation: Secondary | ICD-10-CM | POA: Diagnosis not present

## 2016-04-05 LAB — POCT INR: INR: 1.1

## 2016-04-22 ENCOUNTER — Encounter: Payer: Self-pay | Admitting: Family Medicine

## 2016-04-22 ENCOUNTER — Ambulatory Visit (INDEPENDENT_AMBULATORY_CARE_PROVIDER_SITE_OTHER): Payer: Medicare Other | Admitting: Family Medicine

## 2016-04-22 VITALS — BP 108/58 | HR 75 | Temp 98.4°F | Wt 173.0 lb

## 2016-04-22 DIAGNOSIS — I4891 Unspecified atrial fibrillation: Secondary | ICD-10-CM | POA: Diagnosis not present

## 2016-04-22 DIAGNOSIS — E7800 Pure hypercholesterolemia, unspecified: Secondary | ICD-10-CM | POA: Diagnosis not present

## 2016-04-22 NOTE — Patient Instructions (Addendum)
Check with your insurance to see if they will cover the shingles shot. We'll get your labs done with [facility]  Take care.  Glad to see you.  Update me as needed.

## 2016-04-22 NOTE — Progress Notes (Signed)
Pre visit review using our clinic review tool, if applicable. No additional management support is needed unless otherwise documented below in the visit note.  AF on coumadin.  Prev with dental work done.  In RRR per patient report.  He feels well on current meds. No CP, SOB.  More energy.  No BLE edema, controlled with daily lasix.  He has f/u INR pending for next week at the lab.    Advance care plan- would have wife designated if patient were incapacitated.    Colon and prostate cancer screening declined.  D/w pt.  This is reasonable.    Statin intolerant.  D/w pt.    PMH and SH reviewed  ROS: Per HPI unless specifically indicated in ROS section   Meds, vitals, and allergies reviewed.   GEN: nad, alert and oriented HEENT: mucous membranes moist NECK: supple w/o LA CV: rrr.  no murmur PULM: ctab, no inc wob ABD: soft, +bs EXT: no edema SKIN: no acute rash

## 2016-04-23 NOTE — Assessment & Plan Note (Signed)
He feels well on current meds. No CP, SOB.  More energy.  No BLE edema, controlled with daily lasix.  He has f/u INR pending for next week at the lab.  Will recheck BMET with INR next week, lab slip given to patient.  He agrees. >25 minutes spent in face to face time with patient, >50% spent in counselling or coordination of care.

## 2016-04-23 NOTE — Assessment & Plan Note (Signed)
  Statin intolerant.  D/w pt.

## 2016-04-28 ENCOUNTER — Other Ambulatory Visit: Payer: Self-pay | Admitting: Family Medicine

## 2016-04-28 NOTE — Telephone Encounter (Signed)
I sent the Rx to the pharmacy.

## 2016-04-29 ENCOUNTER — Other Ambulatory Visit: Payer: Self-pay | Admitting: Family Medicine

## 2016-04-29 DIAGNOSIS — I4891 Unspecified atrial fibrillation: Secondary | ICD-10-CM | POA: Diagnosis not present

## 2016-04-29 DIAGNOSIS — I10 Essential (primary) hypertension: Secondary | ICD-10-CM | POA: Diagnosis not present

## 2016-04-30 LAB — BASIC METABOLIC PANEL
BUN / CREAT RATIO: 8 — AB (ref 10–24)
BUN: 8 mg/dL (ref 8–27)
CO2: 24 mmol/L (ref 18–29)
CREATININE: 0.97 mg/dL (ref 0.76–1.27)
Calcium: 8.5 mg/dL — ABNORMAL LOW (ref 8.6–10.2)
Chloride: 96 mmol/L (ref 96–106)
GFR calc Af Amer: 84 mL/min/{1.73_m2} (ref >59)
GFR, EST NON AFRICAN AMERICAN: 73 mL/min/{1.73_m2} (ref >59)
GLUCOSE: 79 mg/dL (ref 65–99)
Potassium: 4.9 mmol/L (ref 3.5–5.2)
Sodium: 134 mmol/L (ref 134–144)

## 2016-04-30 LAB — PROTIME-INR
INR: 2.1 — AB (ref 0.8–1.2)
PROTHROMBIN TIME: 21.3 s — AB (ref 9.1–12.0)

## 2016-05-04 ENCOUNTER — Telehealth: Payer: Self-pay | Admitting: *Deleted

## 2016-05-04 NOTE — Telephone Encounter (Signed)
Form for Standing Order Request from [facility] placed in Dr. Josefine Class In [place].

## 2016-05-05 NOTE — Telephone Encounter (Signed)
I'll work on the hard copy.  Thanks.  

## 2016-05-13 ENCOUNTER — Telehealth: Payer: Self-pay | Admitting: Family Medicine

## 2016-05-13 ENCOUNTER — Other Ambulatory Visit: Payer: Self-pay | Admitting: Family Medicine

## 2016-05-13 NOTE — Telephone Encounter (Signed)
Patient returned Araceli's call. °

## 2016-05-13 NOTE — Telephone Encounter (Signed)
Sent. Thanks.   

## 2016-05-14 NOTE — Telephone Encounter (Signed)
Refer to lab notes 

## 2016-05-27 ENCOUNTER — Other Ambulatory Visit: Payer: Self-pay | Admitting: Family Medicine

## 2016-05-27 DIAGNOSIS — I4891 Unspecified atrial fibrillation: Secondary | ICD-10-CM | POA: Diagnosis not present

## 2016-05-28 DIAGNOSIS — H353221 Exudative age-related macular degeneration, left eye, with active choroidal neovascularization: Secondary | ICD-10-CM | POA: Diagnosis not present

## 2016-05-28 LAB — PROTIME-INR
INR: 2.4 — ABNORMAL HIGH (ref 0.8–1.2)
PROTHROMBIN TIME: 24.3 s — AB (ref 9.1–12.0)

## 2016-05-31 ENCOUNTER — Other Ambulatory Visit: Payer: Self-pay | Admitting: Family Medicine

## 2016-05-31 NOTE — Telephone Encounter (Signed)
Electronic refill request. Last Filled:    60 tablet 0 02/25/2016  Last office visit:   04/22/2016   Please advise.

## 2016-06-01 NOTE — Telephone Encounter (Signed)
Sent. Thanks.   

## 2016-06-18 DIAGNOSIS — D692 Other nonthrombocytopenic purpura: Secondary | ICD-10-CM | POA: Diagnosis not present

## 2016-06-29 ENCOUNTER — Other Ambulatory Visit: Payer: Self-pay | Admitting: Family Medicine

## 2016-06-29 DIAGNOSIS — I4891 Unspecified atrial fibrillation: Secondary | ICD-10-CM | POA: Diagnosis not present

## 2016-06-30 LAB — PROTIME-INR
INR: 3.5 — ABNORMAL HIGH (ref 0.8–1.2)
PROTHROMBIN TIME: 34.3 s — AB (ref 9.1–12.0)

## 2016-07-02 ENCOUNTER — Other Ambulatory Visit: Payer: Self-pay | Admitting: Cardiovascular Disease

## 2016-07-20 ENCOUNTER — Ambulatory Visit: Payer: Medicare Other

## 2016-07-27 ENCOUNTER — Other Ambulatory Visit: Payer: Self-pay | Admitting: Family Medicine

## 2016-07-27 DIAGNOSIS — I4891 Unspecified atrial fibrillation: Secondary | ICD-10-CM | POA: Diagnosis not present

## 2016-07-27 LAB — POCT INR: INR: 3.9 — AB (ref 0.9–1.1)

## 2016-07-28 ENCOUNTER — Telehealth: Payer: Self-pay | Admitting: Family Medicine

## 2016-07-28 ENCOUNTER — Encounter: Payer: Self-pay | Admitting: Family Medicine

## 2016-07-28 LAB — PROTIME-INR
INR: 3.9 — ABNORMAL HIGH (ref 0.8–1.2)
Prothrombin Time: 37.4 s — ABNORMAL HIGH (ref 9.1–12.0)

## 2016-07-28 NOTE — Telephone Encounter (Signed)
Please call pt.  INR 3.9. Would change his dose. Would take a whole tablet on Saturdays and one half tablet on Sunday through Friday. Recheck in 4 weeks. Thanks.

## 2016-07-28 NOTE — Telephone Encounter (Signed)
Patient notified as instructed by telephone and verbalized understanding. Patient stated that he will have his INR checked again as instructed at [facility] close to his house and will make sure that Dr. Damita Dunnings gets the results.

## 2016-07-30 DIAGNOSIS — H353221 Exudative age-related macular degeneration, left eye, with active choroidal neovascularization: Secondary | ICD-10-CM | POA: Diagnosis not present

## 2016-08-02 ENCOUNTER — Telehealth: Payer: Self-pay | Admitting: Cardiovascular Disease

## 2016-08-02 NOTE — Telephone Encounter (Signed)
Pt wife states pt has a lot of chest congestion and he thinks his BP is low, states he feels dizzy and weak.

## 2016-08-02 NOTE — Telephone Encounter (Signed)
S/w pt who reports the thought BP low at [facility] two weeks ago. Reports 125/67 HR upper 60s. He takes metoprolol and enalapril as prescribed. He does not have a BP cuff at home but has plans to purchase one. He has had episodes of "swimmy-headedness" when getting out of the bed in the AM. States PCP told him to get up slowly to avoid falls. Advised pt that BP reports is WNL. He will continue to monitor and keep f/u appt w/Dr. Fletcher Anon in November. He is agreeable w/plan w/no further questions.

## 2016-08-30 ENCOUNTER — Other Ambulatory Visit: Payer: Self-pay | Admitting: Family Medicine

## 2016-08-30 DIAGNOSIS — I4891 Unspecified atrial fibrillation: Secondary | ICD-10-CM | POA: Diagnosis not present

## 2016-08-31 ENCOUNTER — Ambulatory Visit: Payer: Medicare Other | Admitting: Cardiovascular Disease

## 2016-08-31 LAB — PROTIME-INR
INR: 2.6 — ABNORMAL HIGH (ref 0.8–1.2)
Prothrombin Time: 25.5 s — ABNORMAL HIGH (ref 9.1–12.0)

## 2016-09-17 ENCOUNTER — Other Ambulatory Visit: Payer: Self-pay | Admitting: Cardiovascular Disease

## 2016-09-30 ENCOUNTER — Telehealth: Payer: Self-pay | Admitting: Family Medicine

## 2016-09-30 NOTE — Telephone Encounter (Signed)
Please send an order or have him start coming here for INRs.    [facility] usually sends a form for [place] to fill out every 6 months, but I haven't gotten the form for this upcoming period.   If they don't automatically send the form for [place] to work on, then he'll need to start reminding them to send it every 6 months.  If it is his preference to get the labs done there, then he'll need to help [place] make those arrangements.   Thanks.

## 2016-09-30 NOTE — Telephone Encounter (Signed)
Patient said he went to the [facility] in [place] to have his protime checked.  He said he's been going for 3-4 years and they told him they don't have an order for the lab.  Patient would like an order sent to [facility] [phone]. Please call patient when order is done.

## 2016-10-01 NOTE — Telephone Encounter (Signed)
Patient advised.

## 2016-10-07 ENCOUNTER — Other Ambulatory Visit: Payer: Self-pay | Admitting: Family Medicine

## 2016-10-07 DIAGNOSIS — I4891 Unspecified atrial fibrillation: Secondary | ICD-10-CM | POA: Diagnosis not present

## 2016-10-07 LAB — POCT INR: INR: 2.1

## 2016-10-08 ENCOUNTER — Ambulatory Visit (INDEPENDENT_AMBULATORY_CARE_PROVIDER_SITE_OTHER): Payer: Medicare Other

## 2016-10-08 DIAGNOSIS — H353221 Exudative age-related macular degeneration, left eye, with active choroidal neovascularization: Secondary | ICD-10-CM | POA: Diagnosis not present

## 2016-10-08 LAB — PROTIME-INR
INR: 2.1 — AB (ref 0.8–1.2)
PROTHROMBIN TIME: 21.2 s — AB (ref 9.1–12.0)

## 2016-10-08 NOTE — Patient Instructions (Signed)
Pre visit review using our clinic review tool, if applicable. No additional management support is needed unless otherwise documented below in the visit note. 

## 2016-10-10 NOTE — Progress Notes (Signed)
Agree. Thanks

## 2016-10-11 ENCOUNTER — Ambulatory Visit: Payer: Medicare Other | Admitting: Cardiovascular Disease

## 2016-10-18 ENCOUNTER — Other Ambulatory Visit: Payer: Self-pay | Admitting: Cardiovascular Disease

## 2016-11-04 ENCOUNTER — Ambulatory Visit: Payer: Medicare Other | Admitting: Cardiovascular Disease

## 2016-11-04 ENCOUNTER — Encounter: Payer: Self-pay | Admitting: *Deleted

## 2016-11-04 NOTE — Progress Notes (Deleted)
Cardiology Office Note   Date:  11/04/2016   ID:  [name], DOB 26-Jan-1933, MRN [id number]  PCP:  Elsie Stain, MD  Cardiologist:   Kathlyn Sacramento, MD   No chief complaint on file.     History of Present Illness: [name] is a 83 y.o. male who presents for a follow up visit. He has a h/o persistent atrial fibrillation and nonischemic cardiomyopathy.   His afib has been associated with worsening CHF in the past. He had multiple cardioversions. He has known history of mitral valve disease status post mitral valve repair in the 90s. He has no history of coronary artery disease. He is on coumadin for stroke prevention. He has been maintaining in sinus rhythm since he was placed on amiodarone. Most recent echo in March of 2015 showed: Ejection fraction of 35% to 40%, mild aortic stenosis, mitral valve repair with mild to moderate regurgitation, mild to moderate tricuspid regurgitation and pulmonary pressure of 48 mmHg.   he has been doing well from a cardiac standpoint with no chest pain or dyspnea. He was diagnosed with H. pylori and was treated for this. He has been taking his heart medications regularly with no reported side effects. He was noted to have bilateral carotid bruits today. No previous history of stroke.   Past Medical History:  Diagnosis Date  . Atrial fibrillation ([place])   . Blind right eye   . CHF (congestive heart failure) ([place])   . Coronary atherosclerosis of unspecified type of vessel, native or graft 2008   cardioversion. Echo mod LVH EF 25%, Triv AR  Mild LAE, RAE 11/15/*11  . Diaphragmatic hernia without mention of obstruction or gangrene   . Diverticulosis of colon (without mention of hemorrhage)   . Dizziness and giddiness   . Encounter for long-term (current) use of other medications   . Encounter for therapeutic drug monitoring   . Esophageal reflux   . Gastritis 12/18/08   EGD, 3 clips remaining (Dr. Vira Agar)  . Hypertension   .  Iron deficiency anemia, unspecified   . Left heart failure ([place])   . Long term (current) use of anticoagulants   . Macular degeneration   . Personal history of other diseases of digestive system   . Personal history of peptic ulcer disease   . Pure hypercholesterolemia   . Sprain of neck   . Unspecified hemorrhoids without mention of complication   . Unspecified sleep apnea   . Upper GI bleed 7/15-7/20/09   Hosp.  Elevated INR, coumadin stopped sinusitis    Past Surgical History:  Procedure Laterality Date  . APPENDECTOMY    . CARDIAC CATHETERIZATION  1996   [facility]  . CARDIAC CATHETERIZATION    . CARDIAC CATHETERIZATION    . CARDIOVERSION  12/17/2011   Procedure: CARDIOVERSION;  Surgeon: Hillary Bow, MD;  Location: [facility];  Service: Cardiovascular;  Laterality: N/A;  . CATARACT EXTRACTION  10/13/08   left  . CHOLECYSTECTOMY    . MITRAL VALVE ANNULOPLASTY  1996  . MITRAL VALVE REPAIR  1995  . PARTIAL GASTRECTOMY       Current Outpatient Prescriptions  Medication Sig Dispense Refill  . amiodarone (PACERONE) 200 MG tablet TAKE ONE TABLET BY MOUTH ONCE DAILY 90 tablet 3  . cholecalciferol (VITAMIN D) 1000 UNITS tablet Take 1,000 Units by mouth daily.    . cyanocobalamin 100 MCG tablet Take 100 mcg by mouth daily.      . enalapril (VASOTEC) 5  MG tablet TAKE ONE TABLET BY MOUTH ONCE DAILY 90 tablet 3  . eplerenone (INSPRA) 25 MG tablet TAKE ONE TABLET BY MOUTH ONCE DAILY 30 tablet 3  . ferrous sulfate 325 (65 FE) MG tablet Take 325 mg by mouth daily with breakfast.    . finasteride (PROSCAR) 5 MG tablet TAKE ONE TABLET BY MOUTH ONCE DAILY 90 tablet 3  . fish oil-omega-3 fatty acids 1000 MG capsule Take 1 g by mouth daily.    . furosemide (LASIX) 40 MG tablet Take 1 tablet (40 mg total) by mouth daily as needed. 30 tablet 3  . hydroxypropyl methylcellulose (ISOPTO TEARS) 2.5 % ophthalmic solution Place 1 drop into both eyes every morning.    . metoprolol succinate (TOPROL-XL) 50  MG 24 hr tablet TAKE ONE TABLET BY MOUTH ONCE DAILY WITH MEALS OR  IMMEDIATELY  FOLLOWING  A  MEAL 90 tablet 3  . Multiple Vitamin (MULTIVITAMIN) tablet Take 1 tablet by mouth daily.      Marland Kitchen omeprazole (PRILOSEC) 20 MG capsule Take 1 capsule (20 mg total) by mouth daily. 90 capsule 3  . tamsulosin (FLOMAX) 0.4 MG CAPS capsule TAKE ONE CAPSULE BY MOUTH ONCE DAILY 90 capsule 3  . vitamin C (ASCORBIC ACID) 500 MG tablet Take 500 mg by mouth daily.    Marland Kitchen warfarin (COUMADIN) 4 MG tablet TAKE AS DIRECTED BY  ANTICOAGULATION  CLINIC 60 tablet 12   No current facility-administered medications for this visit.     Allergies:   Atorvastatin and Statins    Social History:  The patient  reports that he has never smoked. He has never used smokeless tobacco. He reports that he drinks alcohol. He reports that he does not use drugs.   Family History:  The patient's ***family history includes Cancer in his father.    ROS:  Please see the history of present illness.   Otherwise, review of systems are positive for {NONE DEFAULTED:18576::"none"}.   All other systems are reviewed and negative.    PHYSICAL EXAM: VS:  There were no vitals taken for this visit. , BMI There is no height or weight on file to calculate BMI. GEN: Well nourished, well developed, in no acute distress  HEENT: normal  Neck: no JVD, carotid bruits, or masses Cardiac: ***RRR; no murmurs, rubs, or gallops,no edema  Respiratory:  clear to auscultation bilaterally, normal work of breathing GI: soft, nontender, nondistended, + BS MS: no deformity or atrophy  Skin: warm and dry, no rash Neuro:  Strength and sensation are intact Psych: euthymic mood, full affect   EKG:  EKG {ACTION; IS/IS [id number] ordered today. The ekg ordered today demonstrates ***   Recent Labs: 11/26/2015: ALT 19; TSH 0.943 03/09/2016: Hemoglobin 10.2; Platelets 251 04/29/2016: BUN 8; Creatinine, Ser 0.97; Potassium 4.9; Sodium 134    Lipid Panel      Component Value Date/Time   CHOL 256 (H) 02/08/2014 0953   TRIG 91.0 02/08/2014 0953   HDL 55.20 02/08/2014 0953   CHOLHDL 5 02/08/2014 0953   VLDL 18.2 02/08/2014 0953   LDLCALC 183 (H) 02/08/2014 0953   LDLDIRECT 202.5 01/18/2011 0825      Wt Readings from Last 3 Encounters:  04/22/16 173 lb (78.5 kg)  03/09/16 185 lb (83.9 kg)  11/17/15 185 lb 8 oz (84.1 kg)      Other studies Reviewed: Additional studies/ records that were reviewed today include: ***. Review of the above records demonstrates: ***  No flowsheet data found.  ASSESSMENT AND PLAN:  1.  ***    Disposition:   FU with *** in {gen number [id number] {Days to years:10300}  Signed,  Kathlyn Sacramento, MD  11/04/2016 10:07 AM    [place]

## 2016-11-12 ENCOUNTER — Other Ambulatory Visit: Payer: Self-pay | Admitting: Family Medicine

## 2016-11-12 DIAGNOSIS — I4891 Unspecified atrial fibrillation: Secondary | ICD-10-CM | POA: Diagnosis not present

## 2016-11-13 LAB — PROTIME-INR
INR: 2.6 — ABNORMAL HIGH (ref 0.8–1.2)
Prothrombin Time: 25.9 s — ABNORMAL HIGH (ref 9.1–12.0)

## 2016-11-15 ENCOUNTER — Ambulatory Visit (INDEPENDENT_AMBULATORY_CARE_PROVIDER_SITE_OTHER): Payer: Medicare Other

## 2016-11-15 LAB — POCT INR: INR: 2.6

## 2016-11-15 NOTE — Patient Instructions (Signed)
Pre visit review using our clinic review tool, if applicable. No additional management support is needed unless otherwise documented below in the visit note. 

## 2016-11-17 NOTE — Progress Notes (Signed)
I agree thanks!

## 2016-11-19 ENCOUNTER — Encounter: Payer: Self-pay | Admitting: Cardiovascular Disease

## 2016-11-19 ENCOUNTER — Ambulatory Visit (INDEPENDENT_AMBULATORY_CARE_PROVIDER_SITE_OTHER): Payer: Medicare Other | Admitting: Cardiovascular Disease

## 2016-11-19 VITALS — BP 100/60 | Ht 71.0 in | Wt 177.5 lb

## 2016-11-19 DIAGNOSIS — I779 Disorder of arteries and arterioles, unspecified: Secondary | ICD-10-CM

## 2016-11-19 DIAGNOSIS — I359 Nonrheumatic aortic valve disorder, unspecified: Secondary | ICD-10-CM | POA: Diagnosis not present

## 2016-11-19 DIAGNOSIS — I5022 Chronic systolic (congestive) heart failure: Secondary | ICD-10-CM | POA: Diagnosis not present

## 2016-11-19 DIAGNOSIS — I4819 Other persistent atrial fibrillation: Secondary | ICD-10-CM

## 2016-11-19 DIAGNOSIS — I481 Persistent atrial fibrillation: Secondary | ICD-10-CM | POA: Diagnosis not present

## 2016-11-19 DIAGNOSIS — I739 Peripheral vascular disease, unspecified: Secondary | ICD-10-CM

## 2016-11-19 NOTE — Patient Instructions (Addendum)
Medication Instructions:  Your physician recommends that you continue on your current medications as directed. Please refer to the Current Medication list given to you today.   Labwork: BMET, CBC, liver, and TSH when you come for your echo or carotid duplex  Testing/Procedures: Your physician has requested that you have an echocardiogram. Echocardiography is a painless test that uses sound waves to create images of your heart. It provides your doctor with information about the size and shape of your heart and how well your heart's chambers and valves are working. This procedure takes approximately one hour. There are no restrictions for this procedure.  Your physician has requested that you have a carotid duplex. This test is an ultrasound of the carotid arteries in your neck. It looks at blood flow through these arteries that supply the brain with blood. Allow one hour for this exam. There are no restrictions or special instructions.    Follow-Up: Your physician wants you to follow-up in: 6 months with Dr. Fletcher Anon.  You will receive a reminder letter in the mail two months in advance. If you don't receive a letter, please call our office to schedule the follow-up appointment.   Any Other Special Instructions Will Be Listed Below (If Applicable).     If you need a refill on your cardiac medications before your next appointment, please call your pharmacy.  Echocardiogram An echocardiogram, or echocardiography, uses sound waves (ultrasound) to produce an image of your heart. The echocardiogram is simple, painless, obtained within a short period of time, and offers valuable information to your health care provider. The images from an echocardiogram can provide information such as:  Evidence of coronary artery disease (CAD).  Heart size.  Heart muscle function.  Heart valve function.  Aneurysm detection.  Evidence of a past heart attack.  Fluid buildup around the heart.  Heart  muscle thickening.  Assess heart valve function. Tell a health care provider about:  Any allergies you have.  All medicines you are taking, including vitamins, herbs, eye drops, creams, and over-the-counter medicines.  Any problems you or family members have had with anesthetic medicines.  Any blood disorders you have.  Any surgeries you have had.  Any medical conditions you have.  Whether you are pregnant or may be pregnant. What happens before the procedure? No special preparation is needed. Eat and drink normally. What happens during the procedure?  In order to produce an image of your heart, gel will be applied to your chest and a wand-like tool (transducer) will be moved over your chest. The gel will help transmit the sound waves from the transducer. The sound waves will harmlessly bounce off your heart to allow the heart images to be captured in real-time motion. These images will then be recorded.  You may need an IV to receive a medicine that improves the quality of the pictures. What happens after the procedure? You may return to your normal schedule including diet, activities, and medicines, unless your health care provider tells you otherwise. This information is not intended to replace advice given to you by your health care provider. Make sure you discuss any questions you have with your health care provider. Document Released: 09/17/2000 Document Revised: 05/08/2016 Document Reviewed: 05/28/2013 Elsevier Interactive Patient Education  2017 [employer].

## 2016-11-19 NOTE — Progress Notes (Signed)
Cardiology Office Note   Date:  11/19/2016   ID:  [name], DOB [birth year], MRN [id number]  PCP:  Jeremy Stain, MD  Cardiologist:   Jeremy Sacramento, MD   Chief Complaint  Patient presents with  . other    6 month follow up. Meds reviewed by the pt. verbally. "doing well."       History of Present Illness: [name] is a 83 y.o. male who presents for a follow up visit. He has a h/o persistent atrial fibrillation and nonischemic cardiomyopathy.   His afib has been associated with worsening CHF in the past. He had multiple cardioversions. He has known history of mitral valve disease status post mitral valve repair in the 90s. He has no history of coronary artery disease. He is on coumadin for stroke prevention. He has been maintaining in sinus rhythm since he was placed on amiodarone. Most recent echo in March of 2015 showed: Ejection fraction of 35% to 40%, mild aortic stenosis, mitral valve repair with mild to moderate regurgitation, mild to moderate tricuspid regurgitation and pulmonary pressure of 48 mmHg.  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 He has been doing well overall with no chest pain, shortness of breath or palpitations.   Past Medical History:  Diagnosis Date  . Atrial fibrillation ([place])   . Blind right eye   . CHF (congestive heart failure) ([place])   . Coronary atherosclerosis of unspecified type of vessel, native or graft 2008   cardioversion. Echo mod LVH EF 25%, Triv AR  Mild LAE, RAE 11/15/*11  . Diaphragmatic hernia without mention of obstruction or gangrene   . Diverticulosis of colon (without mention of hemorrhage)   . Dizziness and giddiness   . Encounter for long-term (current) use of other medications   . Encounter for therapeutic drug monitoring   . Esophageal reflux   . Gastritis 12/18/08   EGD, 3 clips remaining (Dr. Vira Parsons)  . Hypertension   .  Iron deficiency anemia, unspecified   . Left heart failure ([place])   . Long term (current) use of anticoagulants   . Macular degeneration   . Personal history of other diseases of digestive system   . Personal history of peptic ulcer disease   . Pure hypercholesterolemia   . Sprain of neck   . Unspecified hemorrhoids without mention of complication   . Unspecified sleep apnea   . Upper GI bleed 7/15-7/20/09   Hosp.  Elevated INR, coumadin stopped sinusitis    Past Surgical History:  Procedure Laterality Date  . APPENDECTOMY    . CARDIAC CATHETERIZATION  1996   [facility]  . CARDIAC CATHETERIZATION    . CARDIAC CATHETERIZATION    . CARDIOVERSION  12/17/2011   Procedure: CARDIOVERSION;  Surgeon: Jeremy Bow, MD;  Location: [facility];  Service: Cardiovascular;  Laterality: N/A;  . CATARACT EXTRACTION  10/13/08   left  . CHOLECYSTECTOMY    . MITRAL VALVE ANNULOPLASTY  1996  . MITRAL VALVE REPAIR  1995  . PARTIAL GASTRECTOMY       Current Outpatient Prescriptions  Medication Sig Dispense Refill  . amiodarone (PACERONE) 200 MG tablet TAKE ONE TABLET BY MOUTH ONCE DAILY 90 tablet 3  . cholecalciferol (VITAMIN D) 1000 UNITS tablet Take 1,000 Units by mouth daily.    . cyanocobalamin 100 MCG tablet Take 100 mcg by mouth daily.      . enalapril (VASOTEC)  5 MG tablet TAKE ONE TABLET BY MOUTH ONCE DAILY 90 tablet 3  . eplerenone (INSPRA) 25 MG tablet TAKE ONE TABLET BY MOUTH ONCE DAILY 30 tablet 3  . ferrous sulfate 325 (65 FE) MG tablet Take 325 mg by mouth daily with breakfast.    . finasteride (PROSCAR) 5 MG tablet TAKE ONE TABLET BY MOUTH ONCE DAILY 90 tablet 3  . fish oil-omega-3 fatty acids 1000 MG capsule Take 1 g by mouth daily.    . furosemide (LASIX) 40 MG tablet Take 1 tablet (40 mg total) by mouth daily as needed. 30 tablet 3  . hydroxypropyl methylcellulose (ISOPTO TEARS) 2.5 % ophthalmic solution Place 1 drop into both eyes every morning.    . metoprolol succinate (TOPROL-XL) 50  MG 24 hr tablet TAKE ONE TABLET BY MOUTH ONCE DAILY WITH MEALS OR  IMMEDIATELY  FOLLOWING  A  MEAL 90 tablet 3  . Multiple Vitamin (MULTIVITAMIN) tablet Take 1 tablet by mouth daily.      Marland Kitchen omeprazole (PRILOSEC) 20 MG capsule Take 1 capsule (20 mg total) by mouth daily. 90 capsule 3  . tamsulosin (FLOMAX) 0.4 MG CAPS capsule TAKE ONE CAPSULE BY MOUTH ONCE DAILY 90 capsule 3  . vitamin C (ASCORBIC ACID) 500 MG tablet Take 500 mg by mouth daily.    Marland Kitchen warfarin (COUMADIN) 4 MG tablet TAKE AS DIRECTED BY  ANTICOAGULATION  CLINIC 60 tablet 12   No current facility-administered medications for this visit.     Allergies:   Atorvastatin and Statins    Social History:  The patient  reports that he has never smoked. He has never used smokeless tobacco. He reports that he drinks alcohol. He reports that he does not use drugs.   Family History:  The patient's family history includes Cancer in his father.    ROS:  Please see the history of present illness.   Otherwise, review of systems are positive for none.   All other systems are reviewed and negative.    PHYSICAL EXAM: VS:  BP 100/60 (BP Location: Left Arm, Patient Position: Sitting, Cuff Size: Normal)   Ht 5\' 11"  (1.803 m)   Wt 177 lb 8 oz (80.5 kg)   BMI 24.76 kg/m  , BMI Body mass index is 24.76 kg/m. GEN: Well nourished, well developed, in no acute distress  HEENT: normal  Neck: no JVD, or masses.left carotid bruit  Cardiac: RRR; no rubs, or gallops,no edema .  2/6 crescendo decrescendo systolic murmur in the aortic area which is mid peaking. Respiratory:  clear to auscultation bilaterally, normal work of breathing GI: soft, nontender, nondistended, + BS MS: no deformity or atrophy  Skin: warm and dry, no rash Neuro:  Strength and sensation are intact Psych: euthymic mood, full affect   EKG:  EKG is ordered today. The ekg ordered today demonstrates  ectopic atrial rhythm with IVCD and nonspecific T wave changes.   Recent  Labs: 11/26/2015: ALT 19; TSH 0.943 03/09/2016: Hemoglobin 10.2; Platelets 251 04/29/2016: BUN 8; Creatinine, Ser 0.97; Potassium 4.9; Sodium 134    Lipid Panel    Component Value Date/Time   CHOL 256 (H) 02/08/2014 0953   TRIG 91.0 02/08/2014 0953   HDL 55.20 02/08/2014 0953   CHOLHDL 5 02/08/2014 0953   VLDL 18.2 02/08/2014 0953   LDLCALC 183 (H) 02/08/2014 0953   LDLDIRECT 202.5 01/18/2011 0825      Wt Readings from Last 3 Encounters:  11/19/16 177 lb 8 oz (80.5 kg)  04/22/16  173 lb (78.5 kg)  03/09/16 185 lb (83.9 kg)      No flowsheet data found.    ASSESSMENT AND PLAN:  1.  Persistent atrial fibrillation: Maintaining in sinus rhythm with amiodarone. He is on long-term anticoagulation with warfarin. I requested routine labs that include CBC, basic metabolic profile, liver profile and TSH given that he is on amiodarone.  2. Bilateral carotid disease: Known occluded right carotid artery and moderate left carotid stenosis. I requested a follow-up carotid Doppler.  3. Chronic systolic heart failure: I requested routine labs given that he is on eplerenone. Blood pressure is somewhat on the low side but with no significant symptoms.  4. Previous mitral valve repair with mild aortic stenosis. I requested a follow-up echocardiogram. His exam is suggestive of at least moderate aortic stenosis.    Disposition:   FU with me in 6 months  Signed,  Jeremy Sacramento, MD  11/19/2016 11:59 AM    [place]

## 2016-11-28 ENCOUNTER — Other Ambulatory Visit: Payer: Self-pay | Admitting: Cardiovascular Disease

## 2016-12-09 ENCOUNTER — Other Ambulatory Visit: Payer: Self-pay | Admitting: Family Medicine

## 2016-12-09 DIAGNOSIS — I4891 Unspecified atrial fibrillation: Secondary | ICD-10-CM | POA: Diagnosis not present

## 2016-12-09 LAB — POCT INR: INR: 1.3

## 2016-12-10 ENCOUNTER — Ambulatory Visit (INDEPENDENT_AMBULATORY_CARE_PROVIDER_SITE_OTHER): Payer: Medicare Other

## 2016-12-10 ENCOUNTER — Other Ambulatory Visit: Payer: Self-pay

## 2016-12-10 DIAGNOSIS — H353222 Exudative age-related macular degeneration, left eye, with inactive choroidal neovascularization: Secondary | ICD-10-CM | POA: Diagnosis not present

## 2016-12-10 LAB — PROTIME-INR
INR: 1.3 — AB (ref 0.8–1.2)
PROTHROMBIN TIME: 13.4 s — AB (ref 9.1–12.0)

## 2016-12-10 NOTE — Telephone Encounter (Signed)
Spoke with patient this am regarding his INR results from [facility] faxed this am.  (please refer to coag encounter note for details).  While on phone patient mentions that he is in need of refills on his Warfarin.  I note in patient's chart that he should have plenty of refills on his coumadin.  We do not need to place a new order at this time.  I called wife back at 11:39pm today to inform her.  She will call [facility] and have them fill it.

## 2016-12-10 NOTE — Patient Instructions (Signed)
Pre visit review using our clinic review tool, if applicable. No additional management support is needed unless otherwise documented below in the visit note. 

## 2016-12-12 NOTE — Progress Notes (Signed)
Agree. Thanks

## 2016-12-16 ENCOUNTER — Other Ambulatory Visit: Payer: Self-pay

## 2016-12-16 ENCOUNTER — Ambulatory Visit (INDEPENDENT_AMBULATORY_CARE_PROVIDER_SITE_OTHER): Payer: Medicare Other

## 2016-12-16 ENCOUNTER — Ambulatory Visit: Payer: Medicare Other

## 2016-12-16 ENCOUNTER — Other Ambulatory Visit (INDEPENDENT_AMBULATORY_CARE_PROVIDER_SITE_OTHER): Payer: Medicare Other

## 2016-12-16 DIAGNOSIS — I4819 Other persistent atrial fibrillation: Secondary | ICD-10-CM

## 2016-12-16 DIAGNOSIS — I359 Nonrheumatic aortic valve disorder, unspecified: Secondary | ICD-10-CM | POA: Diagnosis not present

## 2016-12-16 DIAGNOSIS — I779 Disorder of arteries and arterioles, unspecified: Secondary | ICD-10-CM

## 2016-12-16 DIAGNOSIS — I481 Persistent atrial fibrillation: Secondary | ICD-10-CM | POA: Diagnosis not present

## 2016-12-16 DIAGNOSIS — I739 Peripheral vascular disease, unspecified: Principal | ICD-10-CM

## 2016-12-16 DIAGNOSIS — I6523 Occlusion and stenosis of bilateral carotid arteries: Secondary | ICD-10-CM | POA: Diagnosis not present

## 2016-12-17 ENCOUNTER — Telehealth: Payer: Self-pay | Admitting: Family Medicine

## 2016-12-17 ENCOUNTER — Telehealth: Payer: Self-pay

## 2016-12-17 LAB — VAS US CAROTID
LCCADSYS: 77 cm/s
LCCAPDIAS: -23 cm/s
LEFT ECA DIAS: -16 cm/s
LEFT VERTEBRAL DIAS: 37 cm/s
LICADDIAS: -57 cm/s
LICADSYS: -139 cm/s
LICAPDIAS: -65 cm/s
LICAPSYS: -174 cm/s
Left CCA dist dias: 25 cm/s
Left CCA prox sys: -91 cm/s
RCCAPSYS: -37 cm/s
RIGHT ECA DIAS: 0 cm/s
Right CCA prox dias: 0 cm/s

## 2016-12-17 LAB — TSH: TSH: 0.845 u[IU]/mL (ref 0.450–4.500)

## 2016-12-17 NOTE — Telephone Encounter (Signed)
Pt would like me to call back 1st of next week to schedule AWV

## 2016-12-17 NOTE — Telephone Encounter (Signed)
This Probation officer still has not received the lab report from [facility] on patient's INR this week.  He was subtherapeutic at last check and it is very important for him to have level checked as soon as possible.  He acknowledges that he did not go this week due to multiple doctors appointments.    Patient and I spoke in detail today about need for reading.  He verbalizes understanding and agrees to go Monday 12/20/16 for check.  Will contact patient as soon as I received results.

## 2016-12-21 ENCOUNTER — Other Ambulatory Visit: Payer: Self-pay | Admitting: Family Medicine

## 2016-12-21 DIAGNOSIS — I4891 Unspecified atrial fibrillation: Secondary | ICD-10-CM | POA: Diagnosis not present

## 2016-12-22 ENCOUNTER — Ambulatory Visit (INDEPENDENT_AMBULATORY_CARE_PROVIDER_SITE_OTHER): Payer: Medicare Other

## 2016-12-22 LAB — PROTIME-INR
INR: 1.5 — ABNORMAL HIGH (ref 0.8–1.2)
Prothrombin Time: 15.7 s — ABNORMAL HIGH (ref 9.1–12.0)

## 2016-12-22 LAB — POCT INR: INR: 1.5

## 2016-12-22 NOTE — Patient Instructions (Signed)
Pre visit review using our clinic review tool, if applicable. No additional management support is needed unless otherwise documented below in the visit note. 

## 2016-12-22 NOTE — Progress Notes (Signed)
Agree, thanks

## 2016-12-24 ENCOUNTER — Other Ambulatory Visit: Payer: Self-pay

## 2016-12-24 DIAGNOSIS — I482 Chronic atrial fibrillation, unspecified: Secondary | ICD-10-CM

## 2016-12-27 ENCOUNTER — Other Ambulatory Visit: Payer: Self-pay

## 2016-12-27 DIAGNOSIS — I482 Chronic atrial fibrillation, unspecified: Secondary | ICD-10-CM

## 2017-01-04 NOTE — Telephone Encounter (Signed)
Patient called to cancel his acute appointment with Dr.Duncan on 01/05/17.  Patient said he'll call back in June to schedule his AWV.

## 2017-01-05 ENCOUNTER — Ambulatory Visit: Payer: Medicare Other | Admitting: Family Medicine

## 2017-01-09 ENCOUNTER — Telehealth: Payer: Self-pay | Admitting: Family Medicine

## 2017-01-09 NOTE — Telephone Encounter (Signed)
I think patient cancelled recent appointment re: anemia.  When is he coming in for this?  If not scheduled, then needs to be scheduled.  Thanks.

## 2017-01-10 NOTE — Telephone Encounter (Signed)
Thanks

## 2017-01-10 NOTE — Telephone Encounter (Signed)
Spoke to patient by telephone and appointment was rescheduled for 01/19/17.

## 2017-01-19 ENCOUNTER — Encounter: Payer: Self-pay | Admitting: Family Medicine

## 2017-01-19 ENCOUNTER — Ambulatory Visit (INDEPENDENT_AMBULATORY_CARE_PROVIDER_SITE_OTHER): Payer: Medicare Other | Admitting: Family Medicine

## 2017-01-19 VITALS — BP 120/64 | HR 79 | Temp 97.7°F | Ht 71.0 in | Wt 176.8 lb

## 2017-01-19 DIAGNOSIS — D649 Anemia, unspecified: Secondary | ICD-10-CM | POA: Diagnosis not present

## 2017-01-19 DIAGNOSIS — D509 Iron deficiency anemia, unspecified: Secondary | ICD-10-CM | POA: Diagnosis not present

## 2017-01-19 NOTE — Patient Instructions (Signed)
Stay on the iron for now.  Go to the lab on the way out.  We'll contact you with your lab report. Take care.  Glad to see you.

## 2017-01-19 NOTE — Progress Notes (Signed)
Most recent hgbs usually 10-11.  Lower recently.  Prev normal HGB years ago, about 4-6 years ago.  h/o colonoscopy years ago.    Is on iron now, had been off for a while but recently restarted after labs check about 3-4 weeks ago.  Still on coumadin.  On PPI.    H/o gastric ulcer years ago but no abd sx recently.  No black stools.  No BRBPR.  No unexpected weight loss.    Recently with a cold but he is not having fevers.  No vomiting, no diarrhea.  No abd pain.    No FH colon cancer.    PMH and SH reviewed  ROS: Per HPI unless specifically indicated in ROS section   Meds, vitals, and allergies reviewed.   GEN: nad, alert and oriented HEENT: mucous membranes moist NECK: supple w/o LA CV: rrr.  Murmur noted.  PULM: ctab, no inc wob ABD: soft, +bs EXT: no edema SKIN: no acute rash

## 2017-01-19 NOTE — Progress Notes (Signed)
Pre visit review using our clinic review tool, if applicable. No additional management support is needed unless otherwise documented below in the visit note. 

## 2017-01-19 NOTE — Assessment & Plan Note (Signed)
Worse recently, presumed iron def.  D/w pt.  Recheck labs today, continue iron.  No gross blood in stools.  No black stools.  Not lightheaded, no CP, okay for outpatient f/u.  It may be more hazardous to w/u with colonoscopy unless he has sig sx; d/w pt about risk/benefit of invasive w/u.  He wants to avoid colonoscopy if possible esp with concurrent coumadin use.  It may be better for patient to treat iron loss with repletion.  See notes on labs.  He agrees with plan.

## 2017-01-21 LAB — IRON: IRON: 15 ug/dL — AB (ref 38–169)

## 2017-01-21 LAB — CBC WITH DIFFERENTIAL/PLATELET
BASOS ABS: 0.1 10*3/uL (ref 0.0–0.2)
Basos: 2 %
EOS (ABSOLUTE): 0.1 10*3/uL (ref 0.0–0.4)
Eos: 2 %
HEMOGLOBIN: 9.1 g/dL — AB (ref 13.0–17.7)
Hematocrit: 30.7 % — ABNORMAL LOW (ref 37.5–51.0)
Immature Grans (Abs): 0 10*3/uL (ref 0.0–0.1)
Immature Granulocytes: 0 %
LYMPHS ABS: 1.5 10*3/uL (ref 0.7–3.1)
LYMPHS: 44 %
MCH: 21.4 pg — ABNORMAL LOW (ref 26.6–33.0)
MCHC: 29.6 g/dL — AB (ref 31.5–35.7)
MCV: 72 fL — ABNORMAL LOW (ref 79–97)
MONOCYTES: 15 %
Monocytes Absolute: 0.5 10*3/uL (ref 0.1–0.9)
Neutrophils Absolute: 1.3 10*3/uL — ABNORMAL LOW (ref 1.4–7.0)
Neutrophils: 37 %
PLATELETS: 258 10*3/uL (ref 150–379)
RBC: 4.26 x10E6/uL (ref 4.14–5.80)
RDW: 18.9 % — ABNORMAL HIGH (ref 12.3–15.4)
WBC: 3.4 10*3/uL (ref 3.4–10.8)

## 2017-01-23 ENCOUNTER — Other Ambulatory Visit: Payer: Self-pay | Admitting: Family Medicine

## 2017-01-23 DIAGNOSIS — D509 Iron deficiency anemia, unspecified: Secondary | ICD-10-CM

## 2017-01-25 ENCOUNTER — Encounter: Payer: Self-pay | Admitting: *Deleted

## 2017-01-28 ENCOUNTER — Other Ambulatory Visit: Payer: Self-pay | Admitting: Family Medicine

## 2017-01-28 DIAGNOSIS — I4891 Unspecified atrial fibrillation: Secondary | ICD-10-CM | POA: Diagnosis not present

## 2017-01-28 LAB — POCT INR: INR: 2.1

## 2017-01-29 LAB — PROTIME-INR
INR: 2.1 — ABNORMAL HIGH (ref 0.8–1.2)
Prothrombin Time: 21.4 s — ABNORMAL HIGH (ref 9.1–12.0)

## 2017-01-31 ENCOUNTER — Ambulatory Visit (INDEPENDENT_AMBULATORY_CARE_PROVIDER_SITE_OTHER): Payer: Medicare Other

## 2017-02-01 NOTE — Patient Instructions (Signed)
Pre visit review using our clinic review tool, if applicable. No additional management support is needed unless otherwise documented below in the visit note. 

## 2017-02-02 NOTE — Progress Notes (Signed)
agree.  thanks.  

## 2017-02-04 DIAGNOSIS — H353222 Exudative age-related macular degeneration, left eye, with inactive choroidal neovascularization: Secondary | ICD-10-CM | POA: Diagnosis not present

## 2017-02-18 ENCOUNTER — Other Ambulatory Visit: Payer: Self-pay | Admitting: Family Medicine

## 2017-02-18 DIAGNOSIS — D509 Iron deficiency anemia, unspecified: Secondary | ICD-10-CM | POA: Diagnosis not present

## 2017-02-18 DIAGNOSIS — I4891 Unspecified atrial fibrillation: Secondary | ICD-10-CM | POA: Diagnosis not present

## 2017-02-19 LAB — CBC WITH DIFFERENTIAL/PLATELET
Basophils Absolute: 0.1 10*3/uL (ref 0.0–0.2)
Basos: 1 %
EOS (ABSOLUTE): 0.1 10*3/uL (ref 0.0–0.4)
EOS: 2 %
HEMATOCRIT: 34.9 % — AB (ref 37.5–51.0)
HEMOGLOBIN: 10.5 g/dL — AB (ref 13.0–17.7)
Immature Grans (Abs): 0 10*3/uL (ref 0.0–0.1)
Immature Granulocytes: 1 %
Lymphocytes Absolute: 1.8 10*3/uL (ref 0.7–3.1)
Lymphs: 47 %
MCH: 23.2 pg — AB (ref 26.6–33.0)
MCHC: 30.1 g/dL — ABNORMAL LOW (ref 31.5–35.7)
MCV: 77 fL — AB (ref 79–97)
MONOCYTES: 14 %
Monocytes Absolute: 0.5 10*3/uL (ref 0.1–0.9)
NEUTROS ABS: 1.4 10*3/uL (ref 1.4–7.0)
Neutrophils: 35 %
Platelets: 248 10*3/uL (ref 150–379)
RBC: 4.52 x10E6/uL (ref 4.14–5.80)
RDW: 23.6 % — ABNORMAL HIGH (ref 12.3–15.4)
WBC: 3.8 10*3/uL (ref 3.4–10.8)

## 2017-02-19 LAB — PROTIME-INR
INR: 3.1 — ABNORMAL HIGH (ref 0.8–1.2)
Prothrombin Time: 30.4 s — ABNORMAL HIGH (ref 9.1–12.0)

## 2017-02-22 ENCOUNTER — Ambulatory Visit (INDEPENDENT_AMBULATORY_CARE_PROVIDER_SITE_OTHER): Payer: Medicare Other

## 2017-02-22 LAB — POCT INR: INR: 3.1

## 2017-02-22 NOTE — Patient Instructions (Signed)
Pre visit review using our clinic review tool, if applicable. No additional management support is needed unless otherwise documented below in the visit note.  INR today 3.1  Patient denies any changes to medications, health or diet.  He states that he has noticed some bruising and redness to his right forearm but denies any pain or warmth to the area.  Patient is to hold his coumadin tomorrow (5/23) as already taken today and then resume his prior dosing of 1/2 pill daily EXCEPT for 1 pill on Tuesdays and Saturdays.  Recheck in 1 month.  We reviewed his other lab results and instructions given to continue taking his daily Iron supplement and will recheck Iron Panel and CBC in 1 month.  Patient verbalizes understanding of all instructions given today and will follow up with [place] here or go to the ER if any progression of right arm bruising or development of other concerns.

## 2017-02-23 ENCOUNTER — Other Ambulatory Visit: Payer: Self-pay | Admitting: Family Medicine

## 2017-02-23 DIAGNOSIS — I4819 Other persistent atrial fibrillation: Secondary | ICD-10-CM

## 2017-02-23 DIAGNOSIS — D509 Iron deficiency anemia, unspecified: Secondary | ICD-10-CM

## 2017-02-28 NOTE — Progress Notes (Signed)
Agree. Thanks

## 2017-03-25 ENCOUNTER — Other Ambulatory Visit: Payer: Self-pay | Admitting: Family Medicine

## 2017-03-25 DIAGNOSIS — I4891 Unspecified atrial fibrillation: Secondary | ICD-10-CM | POA: Diagnosis not present

## 2017-03-26 LAB — PROTIME-INR
INR: 3.5 — AB (ref 0.8–1.2)
Prothrombin Time: 34.4 s — ABNORMAL HIGH (ref 9.1–12.0)

## 2017-03-28 ENCOUNTER — Other Ambulatory Visit: Payer: Self-pay | Admitting: Family Medicine

## 2017-03-28 ENCOUNTER — Ambulatory Visit (INDEPENDENT_AMBULATORY_CARE_PROVIDER_SITE_OTHER): Payer: Medicare Other

## 2017-03-28 LAB — POCT INR: INR: 3.5

## 2017-03-28 NOTE — Patient Instructions (Signed)
Pre visit review using our clinic review tool, if applicable. No additional management support is needed unless otherwise documented below in the visit note. 

## 2017-03-30 NOTE — Progress Notes (Signed)
Agree. Thanks

## 2017-03-31 ENCOUNTER — Telehealth: Payer: Self-pay | Admitting: *Deleted

## 2017-03-31 NOTE — Telephone Encounter (Signed)
Faxed form received for Standing Order at [place].  Form placed in Dr. Josefine Class In [place].

## 2017-03-31 NOTE — Telephone Encounter (Signed)
Form done. Thanks. 

## 2017-04-01 NOTE — Telephone Encounter (Signed)
Form faxed and scanned.

## 2017-04-15 DIAGNOSIS — H353221 Exudative age-related macular degeneration, left eye, with active choroidal neovascularization: Secondary | ICD-10-CM | POA: Diagnosis not present

## 2017-04-21 ENCOUNTER — Telehealth: Payer: Self-pay

## 2017-04-21 ENCOUNTER — Other Ambulatory Visit: Payer: Self-pay | Admitting: Family Medicine

## 2017-04-21 DIAGNOSIS — D508 Other iron deficiency anemias: Secondary | ICD-10-CM

## 2017-04-21 NOTE — Telephone Encounter (Signed)
Patient missed his INR check at [facility] this past Monday (7/16).  He plans on going this Monday (7/23) and I have asked him to be sure they are rechecking his "blood counts and iron level" as we put in the order over 1 month ago.   They should have checked it with his June labs but I don't see where we ever received the result.  I have made Terri in our lab aware so that she can be sure that the tests have been ordered.  Thanks.

## 2017-04-25 ENCOUNTER — Other Ambulatory Visit: Payer: Self-pay | Admitting: Family Medicine

## 2017-04-25 DIAGNOSIS — I4891 Unspecified atrial fibrillation: Secondary | ICD-10-CM | POA: Diagnosis not present

## 2017-04-25 DIAGNOSIS — D508 Other iron deficiency anemias: Secondary | ICD-10-CM | POA: Diagnosis not present

## 2017-04-26 ENCOUNTER — Ambulatory Visit (INDEPENDENT_AMBULATORY_CARE_PROVIDER_SITE_OTHER): Payer: Medicare Other

## 2017-04-26 LAB — CBC WITH DIFFERENTIAL/PLATELET
BASOS: 1 %
Basophils Absolute: 0 10*3/uL (ref 0.0–0.2)
EOS (ABSOLUTE): 0 10*3/uL (ref 0.0–0.4)
Eos: 1 %
Hematocrit: 34.7 % — ABNORMAL LOW (ref 37.5–51.0)
Hemoglobin: 11 g/dL — ABNORMAL LOW (ref 13.0–17.7)
IMMATURE GRANS (ABS): 0 10*3/uL (ref 0.0–0.1)
IMMATURE GRANULOCYTES: 1 %
LYMPHS: 43 %
Lymphocytes Absolute: 1.7 10*3/uL (ref 0.7–3.1)
MCH: 25.8 pg — ABNORMAL LOW (ref 26.6–33.0)
MCHC: 31.7 g/dL (ref 31.5–35.7)
MCV: 81 fL (ref 79–97)
Monocytes Absolute: 0.5 10*3/uL (ref 0.1–0.9)
Monocytes: 13 %
NEUTROS PCT: 41 %
Neutrophils Absolute: 1.6 10*3/uL (ref 1.4–7.0)
PLATELETS: 227 10*3/uL (ref 150–379)
RBC: 4.27 x10E6/uL (ref 4.14–5.80)
RDW: 18.7 % — ABNORMAL HIGH (ref 12.3–15.4)
WBC: 3.9 10*3/uL (ref 3.4–10.8)

## 2017-04-26 LAB — IRON AND TIBC
IRON SATURATION: 9 % — AB (ref 15–55)
IRON: 34 ug/dL — AB (ref 38–169)
TIBC: 398 ug/dL (ref 250–450)
UIBC: 364 ug/dL — AB (ref 111–343)

## 2017-04-26 LAB — PROTIME-INR
INR: 1.6 — AB (ref 0.8–1.2)
PROTHROMBIN TIME: 16.3 s — AB (ref 9.1–12.0)

## 2017-04-26 LAB — TRANSFERRIN: Transferrin: 304 mg/dL (ref 200–370)

## 2017-04-26 LAB — POCT INR: INR: 1.6

## 2017-04-26 NOTE — Addendum Note (Signed)
Addended by: Ellamae Sia on: 04/26/2017 02:26 PM   Modules accepted: Orders

## 2017-04-26 NOTE — Patient Instructions (Signed)
Pre visit review using our clinic review tool, if applicable. No additional management support is needed unless otherwise documented below in the visit note. 

## 2017-04-27 NOTE — Progress Notes (Signed)
Agreed, thanks

## 2017-04-29 DIAGNOSIS — Z4420 Encounter for fitting and adjustment of artificial eye, unspecified: Secondary | ICD-10-CM | POA: Diagnosis not present

## 2017-05-17 ENCOUNTER — Other Ambulatory Visit: Payer: Self-pay | Admitting: Family Medicine

## 2017-05-19 ENCOUNTER — Telehealth: Payer: Self-pay

## 2017-05-19 NOTE — Telephone Encounter (Signed)
Patient is passed due for his next coumadin check.  I have left message to have him call me to discuss setting up next appointment.

## 2017-05-23 ENCOUNTER — Other Ambulatory Visit: Payer: Self-pay | Admitting: Family Medicine

## 2017-05-23 DIAGNOSIS — I4891 Unspecified atrial fibrillation: Secondary | ICD-10-CM | POA: Diagnosis not present

## 2017-05-23 NOTE — Telephone Encounter (Signed)
Patient reports that he went to [facility] today for lab draw.  No results yet, will check tomorrow and if not received will call the lab.  Patient unsure whether they rechecked his CBC and TIBC per Dr. Carole Civil request in July. Will verify tomorrow when results come in.

## 2017-05-24 ENCOUNTER — Ambulatory Visit: Payer: Self-pay

## 2017-05-24 LAB — PROTIME-INR
INR: 1.7 — AB (ref 0.8–1.2)
PROTHROMBIN TIME: 17 s — AB (ref 9.1–12.0)

## 2017-05-24 LAB — POCT INR: INR: 1.7

## 2017-05-24 NOTE — Patient Instructions (Signed)
Pre visit review using our clinic review tool, if applicable. No additional management support is needed unless otherwise documented below in the visit note. 

## 2017-05-25 NOTE — Progress Notes (Signed)
Agree, thanks

## 2017-05-27 DIAGNOSIS — D485 Neoplasm of uncertain behavior of skin: Secondary | ICD-10-CM | POA: Diagnosis not present

## 2017-06-10 NOTE — Telephone Encounter (Signed)
ERRONEOUS ENCOUNTER

## 2017-06-13 ENCOUNTER — Other Ambulatory Visit: Payer: Self-pay | Admitting: Family Medicine

## 2017-06-14 NOTE — Telephone Encounter (Signed)
Patient is compliant with coumadin management with close oversight to keep on schedule with his routine [facility] checks.

## 2017-06-17 DIAGNOSIS — H353221 Exudative age-related macular degeneration, left eye, with active choroidal neovascularization: Secondary | ICD-10-CM | POA: Diagnosis not present

## 2017-06-17 DIAGNOSIS — H353211 Exudative age-related macular degeneration, right eye, with active choroidal neovascularization: Secondary | ICD-10-CM | POA: Diagnosis not present

## 2017-06-22 ENCOUNTER — Other Ambulatory Visit: Payer: Self-pay | Admitting: Family Medicine

## 2017-06-23 ENCOUNTER — Other Ambulatory Visit: Payer: Self-pay | Admitting: Family Medicine

## 2017-06-23 DIAGNOSIS — L570 Actinic keratosis: Secondary | ICD-10-CM | POA: Diagnosis not present

## 2017-06-23 DIAGNOSIS — I4891 Unspecified atrial fibrillation: Secondary | ICD-10-CM | POA: Diagnosis not present

## 2017-06-23 NOTE — Telephone Encounter (Signed)
Electronic refill request.  Finasteride Last office visit:   01/09/2017  No CPE since 2016 Last Filled:    90 tablet 3 05/13/2016  Please advise.

## 2017-06-24 ENCOUNTER — Ambulatory Visit: Payer: Self-pay

## 2017-06-24 LAB — PROTIME-INR
INR: 3.6 — AB (ref 0.8–1.2)
PROTHROMBIN TIME: 34.9 s — AB (ref 9.1–12.0)

## 2017-06-24 LAB — POCT INR: INR: 3.6

## 2017-06-24 NOTE — Telephone Encounter (Signed)
Sent.  Due for CPE.  Please schedule.  Thanks.

## 2017-06-24 NOTE — Telephone Encounter (Signed)
Patient advised.

## 2017-06-24 NOTE — Patient Instructions (Signed)
Pre visit review using our clinic review tool, if applicable. No additional management support is needed unless otherwise documented below in the visit note. 

## 2017-06-25 NOTE — Progress Notes (Signed)
Agree. Thanks

## 2017-07-08 ENCOUNTER — Telehealth: Payer: Self-pay

## 2017-07-08 NOTE — Telephone Encounter (Signed)
I have not received his [facility] INR report from this week.  Spoke with patient and he has not gone yet.  He promises to go next week for recheck.  I will be out of the office all week but will be sure result is forwarded to PCP to manage.

## 2017-07-10 NOTE — Telephone Encounter (Signed)
Will await results.  Thanks.

## 2017-07-13 ENCOUNTER — Ambulatory Visit: Payer: Self-pay

## 2017-07-13 ENCOUNTER — Other Ambulatory Visit: Payer: Self-pay | Admitting: Family Medicine

## 2017-07-13 DIAGNOSIS — I4891 Unspecified atrial fibrillation: Secondary | ICD-10-CM | POA: Diagnosis not present

## 2017-07-13 LAB — POCT INR: INR: 2.1

## 2017-07-14 LAB — PROTIME-INR
INR: 2.1 — ABNORMAL HIGH (ref 0.8–1.2)
PROTHROMBIN TIME: 21.2 s — AB (ref 9.1–12.0)

## 2017-07-19 NOTE — Patient Instructions (Addendum)
Pre visit review using our clinic review tool, if applicable. No additional management support is needed unless otherwise documented below in the visit note. 

## 2017-07-24 NOTE — Progress Notes (Signed)
Agreed.  Thanks.  

## 2017-07-29 ENCOUNTER — Encounter: Payer: Self-pay | Admitting: Family Medicine

## 2017-08-05 DIAGNOSIS — H353221 Exudative age-related macular degeneration, left eye, with active choroidal neovascularization: Secondary | ICD-10-CM | POA: Diagnosis not present

## 2017-08-16 DIAGNOSIS — H353221 Exudative age-related macular degeneration, left eye, with active choroidal neovascularization: Secondary | ICD-10-CM | POA: Diagnosis not present

## 2017-08-16 DIAGNOSIS — H43812 Vitreous degeneration, left eye: Secondary | ICD-10-CM | POA: Diagnosis not present

## 2017-08-17 ENCOUNTER — Telehealth: Payer: Self-pay

## 2017-08-17 NOTE — Telephone Encounter (Signed)
Patient was due last week for INR check with [facility].  I have not received result as of yet so I called patient to verify.  He has not been yet but plans on going this week.  I explain that we must keep regular appointments and to please have done by Friday.  Also, noted that [facility] did not recheck patient's CBC and FE panel as ordered in August.  Will be sure orders are in place for lab to check this week with INR as this is past due for patient.  Will FYI to Dr. Damita Dunnings.

## 2017-08-18 NOTE — Telephone Encounter (Signed)
Thanks

## 2017-08-19 ENCOUNTER — Other Ambulatory Visit: Payer: Self-pay | Admitting: Family Medicine

## 2017-08-19 DIAGNOSIS — D509 Iron deficiency anemia, unspecified: Secondary | ICD-10-CM

## 2017-08-19 DIAGNOSIS — I4891 Unspecified atrial fibrillation: Secondary | ICD-10-CM | POA: Diagnosis not present

## 2017-08-20 LAB — TRANSFERRIN: Transferrin: 302 mg/dL (ref 200–370)

## 2017-08-20 LAB — CBC WITH DIFFERENTIAL/PLATELET
BASOS ABS: 0 10*3/uL (ref 0.0–0.2)
BASOS: 0 %
EOS (ABSOLUTE): 0.1 10*3/uL (ref 0.0–0.4)
Eos: 1 %
Hematocrit: 39.1 % (ref 37.5–51.0)
Hemoglobin: 12.4 g/dL — ABNORMAL LOW (ref 13.0–17.7)
IMMATURE GRANS (ABS): 0 10*3/uL (ref 0.0–0.1)
IMMATURE GRANULOCYTES: 1 %
LYMPHS: 19 %
Lymphocytes Absolute: 1.2 10*3/uL (ref 0.7–3.1)
MCH: 27.7 pg (ref 26.6–33.0)
MCHC: 31.7 g/dL (ref 31.5–35.7)
MCV: 87 fL (ref 79–97)
Monocytes Absolute: 0.5 10*3/uL (ref 0.1–0.9)
Monocytes: 9 %
NEUTROS PCT: 70 %
Neutrophils Absolute: 4.4 10*3/uL (ref 1.4–7.0)
PLATELETS: 196 10*3/uL (ref 150–379)
RBC: 4.48 x10E6/uL (ref 4.14–5.80)
RDW: 17.1 % — ABNORMAL HIGH (ref 12.3–15.4)
WBC: 6.3 10*3/uL (ref 3.4–10.8)

## 2017-08-20 LAB — PROTIME-INR
INR: 2.1 — ABNORMAL HIGH (ref 0.8–1.2)
Prothrombin Time: 21 s — ABNORMAL HIGH (ref 9.1–12.0)

## 2017-08-20 LAB — IRON AND TIBC
IRON SATURATION: 15 % (ref 15–55)
IRON: 54 ug/dL (ref 38–169)
TIBC: 358 ug/dL (ref 250–450)
UIBC: 304 ug/dL (ref 111–343)

## 2017-08-22 LAB — POCT INR: INR: 2.1

## 2017-08-23 ENCOUNTER — Ambulatory Visit: Payer: Self-pay

## 2017-08-24 NOTE — Progress Notes (Signed)
Agreed.  Thanks.  

## 2017-09-05 ENCOUNTER — Encounter: Payer: Self-pay | Admitting: *Deleted

## 2017-09-21 ENCOUNTER — Other Ambulatory Visit: Payer: Self-pay | Admitting: Family Medicine

## 2017-09-21 DIAGNOSIS — I4891 Unspecified atrial fibrillation: Secondary | ICD-10-CM | POA: Diagnosis not present

## 2017-09-21 LAB — PROTIME-INR: INR: 1.8 — AB (ref <=1.1)

## 2017-09-22 ENCOUNTER — Ambulatory Visit (INDEPENDENT_AMBULATORY_CARE_PROVIDER_SITE_OTHER): Payer: Medicare Other | Admitting: General Practice

## 2017-09-22 DIAGNOSIS — Z7901 Long term (current) use of anticoagulants: Secondary | ICD-10-CM | POA: Diagnosis not present

## 2017-09-22 LAB — PROTIME-INR
INR: 1.8 — ABNORMAL HIGH (ref 0.8–1.2)
Prothrombin Time: 18.2 s — ABNORMAL HIGH (ref 9.1–12.0)

## 2017-09-22 NOTE — Patient Instructions (Signed)
Pre visit review using our clinic review tool, if applicable. No additional management support is needed unless otherwise documented below in the visit note.  Take 1 tablet today (12/20) and then continue taking 1/2 pill (2mg ) daily EXCEPT for 1 pill (4mg ) on Tuesdays only.  Recheck in 4 weeks.  Patient made aware. Re-check at [facility] in 4 weeks.

## 2017-09-23 NOTE — Progress Notes (Signed)
Agree. Thanks

## 2017-09-26 ENCOUNTER — Other Ambulatory Visit: Payer: Self-pay | Admitting: Family Medicine

## 2017-10-06 ENCOUNTER — Other Ambulatory Visit: Payer: Self-pay | Admitting: Cardiovascular Disease

## 2017-10-07 ENCOUNTER — Telehealth: Payer: Self-pay | Admitting: Family Medicine

## 2017-10-07 NOTE — Telephone Encounter (Signed)
Copied from [place] [id number]. Topic: Quick Communication - See Telephone Encounter >> Oct 07, 2017 10:28 AM Boyd Kerbs wrote: CRM for notification. See Telephone encounter for:  He is wanting to go to [facility] in [place] to do his blood work.  He goes every three weeks and wants to go there for physical . Please mail patient the orders for what is needing to be done. He said you can fax to them but please let pt. Know if you do. 10/07/17.

## 2017-10-07 NOTE — Telephone Encounter (Signed)
Pt scheduled on 11/03/17 for CPX.

## 2017-10-12 NOTE — Telephone Encounter (Signed)
Letter done to mail to patient.  I can sign if needed when I get to clinic tomorrow.  Thanks.

## 2017-10-13 ENCOUNTER — Encounter: Payer: Self-pay | Admitting: *Deleted

## 2017-10-13 NOTE — Telephone Encounter (Signed)
Orders mailed to patient.

## 2017-10-18 ENCOUNTER — Other Ambulatory Visit: Payer: Self-pay | Admitting: Family Medicine

## 2017-10-18 DIAGNOSIS — H353221 Exudative age-related macular degeneration, left eye, with active choroidal neovascularization: Secondary | ICD-10-CM | POA: Diagnosis not present

## 2017-10-18 NOTE — Telephone Encounter (Signed)
Electronic refill request. Tamsolusin Last office visit:   01/19/17 Last Filled:    30 capsule 0 09/21/2017  Last fill states that lab work is needed prior to further refills.  Please advise.

## 2017-10-19 NOTE — Telephone Encounter (Signed)
Schedule for CPE.  rx sent.  Thanks.

## 2017-10-19 NOTE — Telephone Encounter (Signed)
Please schedule CPE as instructed.

## 2017-10-20 ENCOUNTER — Encounter: Payer: Self-pay | Admitting: *Deleted

## 2017-10-20 ENCOUNTER — Other Ambulatory Visit: Payer: Self-pay | Admitting: Family Medicine

## 2017-10-20 ENCOUNTER — Telehealth: Payer: Self-pay | Admitting: Family Medicine

## 2017-10-20 DIAGNOSIS — I10 Essential (primary) hypertension: Secondary | ICD-10-CM | POA: Diagnosis not present

## 2017-10-20 DIAGNOSIS — D509 Iron deficiency anemia, unspecified: Secondary | ICD-10-CM | POA: Diagnosis not present

## 2017-10-20 NOTE — Telephone Encounter (Signed)
I have seen exactly zero faxes about this.  Thanks.

## 2017-10-20 NOTE — Telephone Encounter (Signed)
Copied from [place] [id number]. Topic: Quick Communication - See Telephone Encounter >> Oct 20, 2017  4:19 PM Ether Griffins B wrote: CRM for notification. See Telephone encounter for:  [place] with [facility] calling needing an order for PT INR. Pt came in today and had blood drawn and she cant send it with out the order. She has faxed the order over twice. [phone] [employer]) close at 5.  10/20/17.

## 2017-10-20 NOTE — Telephone Encounter (Signed)
Order faxed to [facility].

## 2017-10-21 ENCOUNTER — Ambulatory Visit: Payer: Self-pay

## 2017-10-21 ENCOUNTER — Other Ambulatory Visit: Payer: Self-pay | Admitting: *Deleted

## 2017-10-21 ENCOUNTER — Other Ambulatory Visit: Payer: Self-pay | Admitting: Family Medicine

## 2017-10-21 DIAGNOSIS — R0989 Other specified symptoms and signs involving the circulatory and respiratory systems: Secondary | ICD-10-CM

## 2017-10-21 DIAGNOSIS — I4891 Unspecified atrial fibrillation: Secondary | ICD-10-CM | POA: Diagnosis not present

## 2017-10-21 LAB — COMPREHENSIVE METABOLIC PANEL
ALBUMIN: 4.4 g/dL (ref 3.5–4.7)
ALT: 32 IU/L (ref 0–44)
AST: 37 IU/L (ref 0–40)
Albumin/Globulin Ratio: 1.6 (ref 1.2–2.2)
Alkaline Phosphatase: 69 IU/L (ref 39–117)
BUN / CREAT RATIO: 6 — AB (ref 10–24)
BUN: 5 mg/dL — AB (ref 8–27)
Bilirubin Total: 0.8 mg/dL (ref 0.0–1.2)
CO2: 20 mmol/L (ref 20–29)
CREATININE: 0.78 mg/dL (ref 0.76–1.27)
Calcium: 8.9 mg/dL (ref 8.6–10.2)
Chloride: 93 mmol/L — ABNORMAL LOW (ref 96–106)
GFR calc Af Amer: 97 mL/min/{1.73_m2} (ref >59)
GFR, EST NON AFRICAN AMERICAN: 84 mL/min/{1.73_m2} (ref >59)
GLUCOSE: 97 mg/dL (ref 65–99)
Globulin, Total: 2.8 g/dL (ref 1.5–4.5)
Potassium: 4.7 mmol/L (ref 3.5–5.2)
Sodium: 133 mmol/L — ABNORMAL LOW (ref 134–144)
Total Protein: 7.2 g/dL (ref 6.0–8.5)

## 2017-10-21 LAB — CBC WITH DIFFERENTIAL/PLATELET
BASOS ABS: 0.1 10*3/uL (ref 0.0–0.2)
Basos: 1 %
EOS (ABSOLUTE): 0 10*3/uL (ref 0.0–0.4)
EOS: 1 %
HEMOGLOBIN: 12.1 g/dL — AB (ref 13.0–17.7)
Hematocrit: 38 % (ref 37.5–51.0)
IMMATURE GRANS (ABS): 0 10*3/uL (ref 0.0–0.1)
Immature Granulocytes: 0 %
LYMPHS: 27 %
Lymphocytes Absolute: 1.4 10*3/uL (ref 0.7–3.1)
MCH: 27.3 pg (ref 26.6–33.0)
MCHC: 31.8 g/dL (ref 31.5–35.7)
MCV: 86 fL (ref 79–97)
MONOCYTES: 10 %
Monocytes Absolute: 0.5 10*3/uL (ref 0.1–0.9)
NEUTROS ABS: 3.1 10*3/uL (ref 1.4–7.0)
Neutrophils: 61 %
Platelets: 221 10*3/uL (ref 150–379)
RBC: 4.44 x10E6/uL (ref 4.14–5.80)
RDW: 15.6 % — ABNORMAL HIGH (ref 12.3–15.4)
WBC: 5 10*3/uL (ref 3.4–10.8)

## 2017-10-21 LAB — LIPID PANEL W/O CHOL/HDL RATIO
Cholesterol, Total: 178 mg/dL (ref 100–199)
HDL: 59 mg/dL (ref >39)
LDL Calculated: 104 mg/dL — ABNORMAL HIGH (ref 0–99)
Triglycerides: 75 mg/dL (ref 0–149)
VLDL CHOLESTEROL CAL: 15 mg/dL (ref 5–40)

## 2017-10-21 LAB — IRON AND TIBC
IRON: 33 ug/dL — AB (ref 38–169)
Iron Saturation: 9 % — CL (ref 15–55)
Total Iron Binding Capacity: 388 ug/dL (ref 250–450)
UIBC: 355 ug/dL — ABNORMAL HIGH (ref 111–343)

## 2017-10-21 LAB — TSH: TSH: 0.79 u[IU]/mL (ref 0.450–4.500)

## 2017-10-22 LAB — PROTIME-INR
INR: 2.5 — ABNORMAL HIGH (ref 0.8–1.2)
PROTHROMBIN TIME: 24.2 s — AB (ref 9.1–12.0)

## 2017-10-24 LAB — POCT INR: INR: 2.5

## 2017-10-26 NOTE — Telephone Encounter (Signed)
See pt CRM # [phone]

## 2017-10-26 NOTE — Progress Notes (Signed)
Agree. Thanks

## 2017-10-28 ENCOUNTER — Other Ambulatory Visit: Payer: Medicare Other

## 2017-10-31 ENCOUNTER — Telehealth: Payer: Self-pay

## 2017-10-31 NOTE — Telephone Encounter (Signed)
Phone # updated as requested.

## 2017-10-31 NOTE — Telephone Encounter (Signed)
PLEASE NOTE: All timestamps contained within this report are represented as [place] Standard Time. CONFIDENTIALTY NOTICE: This fax transmission is intended only for the addressee. It contains information that is legally privileged, confidential or otherwise protected from use or disclosure. If you are not the intended recipient, you are strictly prohibited from reviewing, disclosing, copying using or disseminating any of this information or taking any action in reliance on or regarding this information. If you have received this fax in error, please notify us immediately by telephone so that we can arrange for its return to [place]. Phone: [phone], Toll-Free: [phone], Fax: [phone] Page: 1 of 1 Call Id: [id number] [place] Night - Client Nonclinical Telephone Record [place] Night - Client Client Site [place] Physician Renford Dills - MD Contact Type Call Who Is Calling Patient / Member / Family / Caregiver Caller Name Jayant Kriz Caller Phone Number [phone] Patient Name [name] Patient DOB 28-Oct-1932 Call Type Message Only Information Provided Reason for Call Request for General Office Information Initial Comment Caller wants to update her husband's cell number. [phone] Additional Comment Call Closed By: Sharlet Salina Transaction Date/Time: 10/30/2017 12:41:54 PM (ET)

## 2017-11-03 ENCOUNTER — Encounter: Payer: Self-pay | Admitting: Family Medicine

## 2017-11-03 ENCOUNTER — Ambulatory Visit (INDEPENDENT_AMBULATORY_CARE_PROVIDER_SITE_OTHER): Payer: Medicare Other | Admitting: Family Medicine

## 2017-11-03 VITALS — BP 118/72 | HR 76 | Temp 97.5°F | Ht 71.0 in | Wt 180.0 lb

## 2017-11-03 DIAGNOSIS — D509 Iron deficiency anemia, unspecified: Secondary | ICD-10-CM | POA: Diagnosis not present

## 2017-11-03 DIAGNOSIS — Z7189 Other specified counseling: Secondary | ICD-10-CM

## 2017-11-03 DIAGNOSIS — I4891 Unspecified atrial fibrillation: Secondary | ICD-10-CM

## 2017-11-03 DIAGNOSIS — N138 Other obstructive and reflux uropathy: Secondary | ICD-10-CM | POA: Diagnosis not present

## 2017-11-03 DIAGNOSIS — Z0000 Encounter for general adult medical examination without abnormal findings: Secondary | ICD-10-CM | POA: Diagnosis not present

## 2017-11-03 DIAGNOSIS — N401 Enlarged prostate with lower urinary tract symptoms: Secondary | ICD-10-CM | POA: Diagnosis not present

## 2017-11-03 DIAGNOSIS — J310 Chronic rhinitis: Secondary | ICD-10-CM

## 2017-11-03 MED ORDER — FINASTERIDE 5 MG PO TABS: 5.0000 mg | ORAL_TABLET | Freq: Every day | ORAL | 3 refills | Status: DC

## 2017-11-03 NOTE — Patient Instructions (Signed)
Don't change your meds for now.  Recheck CBC and iron panel with your next INR.  I wrote the order for the labs today.  I'll update cardiology about your iron and blood counts.  I already talked to [facility].   Take care.  Glad to see you.

## 2017-11-03 NOTE — Progress Notes (Signed)
I have personally reviewed the Medicare Annual Wellness questionnaire and have noted 1. The patient's medical and social history 2. Their use of alcohol, tobacco or illicit drugs 3. Their current medications and supplements 4. The patient's functional ability including ADL's, fall risks, home safety risks and hearing or visual             impairment. 5. Diet and physical activities 6. Evidence for depression or mood disorders  The patients weight, height, BMI have been recorded in the chart and visual acuity is per eye clinic.  I have made referrals, counseling and provided education to the patient based review of the above and I have provided the pt with a written personalized care plan for preventive services.  Provider list updated- see scanned forms.  Routine anticipatory guidance given to patient.  See health maintenance. The possibility exists that previously documented standard health maintenance information may have been brought forward from a previous encounter into this note.  If needed, that same information has been updated to reflect the current situation based on today's encounter.    Flu 2018 Shingles d/w pt. We are back ordered. PNA up to date Tetanus 2011 Colonoscopy d/w pt.  See below.    Prostate cancer screening and PSA options (with potential risks and benefits of testing vs not testing) were discussed along with recent recs/guidelines.  He declined testing PSA at this point. Advance directive- wife designated if patient were incapacitated.   Cognitive function addressed- see scanned forms- and if abnormal then additional documentation follows.   LUTS controlled with current meds.  D/w pt about deferring PSA given lack of FH and his current anticoagulation and his age.    Yearlong post nasal gtt with no help from OTC pills but hasn't used nasal sprays except for short course of OTC nasal steroid that was possibly too brief to get benefit.  He can get an upset stomach  from the post nasal gtt.     Iron level is low.  D/w pt.  Still on oral iron.  He has dark stools at baseline.  No BRBPR.  No FH colon cancer.  No blood in urine.  Patient reports baseline h/o anemia over his lifetime.  Hgb up to 12 from 11 when checked 6 months ago.  He wanted to avoid procedures.  He doesn't have abd pain.    He has L eye injections for macular degeneration every 2 months at baseline.    Still on coumadin at baseline.  He has f/u with cards pending.  H/o AF but no recent sx per patient report.  No CP, SOB, BLE edema.    PMH and SH reviewed  Meds, vitals, and allergies reviewed.   ROS: Per HPI.  Unless specifically indicated otherwise in HPI, the patient denies:  General: fever. Eyes: acute vision changes ENT: sore throat Cardiovascular: chest pain Respiratory: SOB GI: vomiting GU: dysuria Musculoskeletal: acute back pain Derm: acute rash Neuro: acute motor dysfunction Psych: worsening mood Endocrine: polydipsia Heme: bleeding Allergy: hayfever  GEN: nad, alert and oriented HEENT: mucous membranes moist, nasal exam stuffy.  NECK: supple w/o LA CV: rrr. SEM noted.  PULM: ctab, no inc wob ABD: soft, +bs, not ttp  EXT: no edema SKIN: no acute rash

## 2017-11-04 ENCOUNTER — Other Ambulatory Visit: Payer: Self-pay | Admitting: Cardiovascular Disease

## 2017-11-04 DIAGNOSIS — I6523 Occlusion and stenosis of bilateral carotid arteries: Secondary | ICD-10-CM

## 2017-11-04 DIAGNOSIS — R0989 Other specified symptoms and signs involving the circulatory and respiratory systems: Secondary | ICD-10-CM

## 2017-11-04 DIAGNOSIS — J310 Chronic rhinitis: Secondary | ICD-10-CM | POA: Insufficient documentation

## 2017-11-04 NOTE — Assessment & Plan Note (Signed)
Iron level is low.  D/w pt.  Still on oral iron.  He has dark stools at baseline.  No BRBPR.  No FH colon cancer.  No blood in urine.  Patient reports baseline h/o anemia over his lifetime.  Hgb up to 12 from 11 when checked 6 months ago.  He wanted to avoid procedures.  He doesn't have abd pain.  He is aware he could have GI pathology up to and including cancer.  Given his age and his preferences, we'll recheck iron panel and CBC with next INR.  Lab slip given to patient.  Continue oral iron for now.  He agrees.  Routed to cards and [place] as FYI.  I didn't stop his coumadin.

## 2017-11-04 NOTE — Assessment & Plan Note (Signed)
Sounds to be RRR, he'll check on f/u with cards.  See anemia discussion.  No CP, SOB, BLE edema.

## 2017-11-04 NOTE — Assessment & Plan Note (Signed)
Advance directive- wife designated if patient were incapacitated.  

## 2017-11-04 NOTE — Assessment & Plan Note (Signed)
Flu 2018 Shingles d/w pt. We are back ordered. PNA up to date Tetanus 2011 Colonoscopy d/w pt.  See below.    Prostate cancer screening and PSA options (with potential risks and benefits of testing vs not testing) were discussed along with recent recs/guidelines.  He declined testing PSA at this point. Advance directive- wife designated if patient were incapacitated.   Cognitive function addressed- see scanned forms- and if abnormal then additional documentation follows.

## 2017-11-04 NOTE — Assessment & Plan Note (Signed)
Would likely benefit from longer course of nasal steroid, d/w pt.  Update me if not effective.

## 2017-11-04 NOTE — Assessment & Plan Note (Signed)
Defer PSA given age, LUTS controlled, continue as is.  No ADE on med.

## 2017-11-07 ENCOUNTER — Other Ambulatory Visit: Payer: Self-pay | Admitting: Cardiovascular Disease

## 2017-11-17 ENCOUNTER — Other Ambulatory Visit: Payer: Self-pay | Admitting: Cardiovascular Disease

## 2017-11-28 ENCOUNTER — Other Ambulatory Visit: Payer: Self-pay | Admitting: Family Medicine

## 2017-11-28 DIAGNOSIS — I4891 Unspecified atrial fibrillation: Secondary | ICD-10-CM | POA: Diagnosis not present

## 2017-11-28 DIAGNOSIS — D509 Iron deficiency anemia, unspecified: Secondary | ICD-10-CM | POA: Diagnosis not present

## 2017-11-28 LAB — PROTIME-INR

## 2017-11-29 ENCOUNTER — Telehealth: Payer: Self-pay | Admitting: Family Medicine

## 2017-11-29 ENCOUNTER — Encounter: Payer: Self-pay | Admitting: Family Medicine

## 2017-11-29 LAB — LAB REPORT - SCANNED
INR: 5.6
PT: 53

## 2017-11-29 LAB — IRON AND TIBC
Iron Saturation: 9 % — CL (ref 15–55)
Iron: 34 ug/dL — ABNORMAL LOW (ref 38–169)
TIBC: 386 ug/dL (ref 250–450)
UIBC: 352 ug/dL — ABNORMAL HIGH (ref 111–343)

## 2017-11-29 LAB — CBC WITH DIFFERENTIAL/PLATELET
BASOS ABS: 0 10*3/uL (ref 0.0–0.2)
Basos: 1 %
EOS (ABSOLUTE): 0.1 10*3/uL (ref 0.0–0.4)
Eos: 2 %
HEMATOCRIT: 36.8 % — AB (ref 37.5–51.0)
Hemoglobin: 11.8 g/dL — ABNORMAL LOW (ref 13.0–17.7)
IMMATURE GRANULOCYTES: 0 %
Immature Grans (Abs): 0 10*3/uL (ref 0.0–0.1)
Lymphocytes Absolute: 1.4 10*3/uL (ref 0.7–3.1)
Lymphs: 42 %
MCH: 27.3 pg (ref 26.6–33.0)
MCHC: 32.1 g/dL (ref 31.5–35.7)
MCV: 85 fL (ref 79–97)
MONOS ABS: 0.5 10*3/uL (ref 0.1–0.9)
Monocytes: 13 %
NEUTROS PCT: 42 %
Neutrophils Absolute: 1.5 10*3/uL (ref 1.4–7.0)
PLATELETS: 192 10*3/uL (ref 150–379)
RBC: 4.33 x10E6/uL (ref 4.14–5.80)
RDW: 16 % — AB (ref 12.3–15.4)
WBC: 3.4 10*3/uL (ref 3.4–10.8)

## 2017-11-29 LAB — PROTIME-INR
INR: 5.6 (ref 0.8–1.2)
PROTHROMBIN TIME: 53 s — AB (ref 9.1–12.0)

## 2017-11-29 NOTE — Telephone Encounter (Signed)
[facility] called critical results, INR 5.6, PT 53.0. Results given to [place]

## 2017-11-29 NOTE — Telephone Encounter (Signed)
See result note.  

## 2017-11-30 ENCOUNTER — Ambulatory Visit (INDEPENDENT_AMBULATORY_CARE_PROVIDER_SITE_OTHER): Payer: Medicare Other

## 2017-11-30 DIAGNOSIS — I6523 Occlusion and stenosis of bilateral carotid arteries: Secondary | ICD-10-CM

## 2017-11-30 DIAGNOSIS — R0989 Other specified symptoms and signs involving the circulatory and respiratory systems: Secondary | ICD-10-CM | POA: Diagnosis not present

## 2017-12-04 ENCOUNTER — Telehealth: Payer: Self-pay | Admitting: Family Medicine

## 2017-12-04 NOTE — Telephone Encounter (Signed)
Prev with elevated INR, please make sure he got f/u INR done.   [name] called him about this last week.   If he did get f/u INR done, then I haven't seen the results.   If he isn't going to follow up on the INR and follow our directions, then I wish him the best and he can get another clinic to follow him for this.  Thanks.

## 2017-12-05 NOTE — Telephone Encounter (Signed)
Patient states that he went last Thursday (12/01/17) for recheck on INR and wife confirms; however, when I called [facility] looking for result, they have no record of this.  Only INR lab last week was from 2/25.  Patient says he did hold x 2 days and then went back on his prior dosing after initial call from [place] earlier in the week. (He says this is what he understood he was to do).    Dr. Damita Dunnings,  Patient seems to have progressing confusion.  He was VERY unclear over the events of last week surrounding his coumadin management. Looking back, I can see a slow, progressive change in his responses with me over the phone.  He appears to be mixing up his medications.  His wife also confirms today that he is showing signs of decline with (what she thinks) is due to a drop in his blood sugar and a result of his sleeping all of the time.  He is not driving now and he has to rely on her to take him to his appointments.    I told him that given his current condition that I need to see him TODAY 3/4 for an immediate INR check and dosing plan.  He states he cannot come today because his wife cannot bring him.  I educated him on the dangers and risks concerning inconsistent checks and management of coumadin.  Also, I informed him that we cannot continue to manage him if he is unable to be compliant with instructions and plan.  He verbalizes understanding but states that he has to rely on others for transportation now.  He did agree to see me tomorrow at 1000am for management.  I have instructed him to hold his dose today until he sees me tomorrow for check and further instruction.  He verbalizes understanding.    I do note that you have a 1030 (SAME DAY) slot if you would like or need to see him as well.  Please let me know what you would like for me to do.  Thanks.

## 2017-12-05 NOTE — Telephone Encounter (Signed)
Appointment made

## 2017-12-05 NOTE — Telephone Encounter (Signed)
Please add him onto my schedule tomorrow.  Thanks.

## 2017-12-06 ENCOUNTER — Other Ambulatory Visit: Payer: Self-pay

## 2017-12-06 ENCOUNTER — Ambulatory Visit (INDEPENDENT_AMBULATORY_CARE_PROVIDER_SITE_OTHER): Payer: Medicare Other | Admitting: Family Medicine

## 2017-12-06 ENCOUNTER — Ambulatory Visit: Payer: Medicare Other

## 2017-12-06 ENCOUNTER — Ambulatory Visit (INDEPENDENT_AMBULATORY_CARE_PROVIDER_SITE_OTHER): Payer: Medicare Other

## 2017-12-06 ENCOUNTER — Encounter: Payer: Self-pay | Admitting: Family Medicine

## 2017-12-06 DIAGNOSIS — Z7901 Long term (current) use of anticoagulants: Secondary | ICD-10-CM | POA: Diagnosis not present

## 2017-12-06 DIAGNOSIS — I4891 Unspecified atrial fibrillation: Secondary | ICD-10-CM

## 2017-12-06 DIAGNOSIS — I6523 Occlusion and stenosis of bilateral carotid arteries: Secondary | ICD-10-CM

## 2017-12-06 LAB — POCT INR: INR: 2.1

## 2017-12-06 NOTE — Progress Notes (Signed)
Patient left prior to eval.  This is AMA and he needs to reschedule.  We worked him into the schedule to try to meet his care needs.

## 2017-12-06 NOTE — Patient Instructions (Addendum)
INR TODAY 2.1  *patient held dose last pm per my instruction.   Patient is to restart taking prior dosing of 1/2 pill (2mg ) daily EXCEPT for 1 pill (4mg ) on Tuesdays only.  Recheck in 1 week.  Patient made aware. Re-check at [employer] in 1 week.    Patient and I discussed his confusion over recent medication administration.  He has agreed to start using a pill box to see if this increases safety.  He and I also discussed keeping up with regular consistent checks for his appointments.  I routinely have to call and remind patient to have his level checked.  He is aware and verbalizes understanding that if he cannot keep up with checks and management outside of the office, then he will need to start coming in to the clinic for routine checks.

## 2017-12-07 NOTE — Progress Notes (Signed)
He was to have f/u but walked out prior to eval.  He needs to reschedule and keep an appointment with me.  Please contact him about this.  Agree with anticoagulation note.  Thanks.

## 2017-12-07 NOTE — Assessment & Plan Note (Signed)
Patient left prior to eval.  This is AMA and he needs to reschedule.  We worked him into the schedule to try to meet his care needs.

## 2017-12-08 NOTE — Progress Notes (Signed)
Spoke with patient regarding our concerns and need to come back in ASAP for recheck appt with PCP.  Patient was very nice but a little anxious  stating that he has a lot of "personal" things going and he would have to call me later next week if he could work it out.  I explained that it is very important that we see him as he appears to have multiple things going on right now and needs medical follow up.  I further explained that if he cannot comply with care needs then it may be necessary for Dr. Damita Dunnings to discharge him from care as we cannot safely manage him at that point.  Patient verbalizes understanding and made aware that I will be calling him on Monday to discuss further and schedule an appointment.

## 2017-12-12 ENCOUNTER — Telehealth: Payer: Self-pay | Admitting: Family Medicine

## 2017-12-12 NOTE — Telephone Encounter (Signed)
Copied from [place]. Topic: Quick Communication - See Telephone Encounter >> Dec 12, 2017  9:58 AM Cleaster Corin, NT wrote: CRM for notification. See Telephone encounter for:   12/12/17. Pt. Returning call from [facility] from the coumadin clinic. Best callback number for pt. Is [phone]

## 2017-12-12 NOTE — Telephone Encounter (Signed)
Spoke with patient regarding need for appointments.  Patient reports that he has been mixing up wife's pm OTC sleep medication with his coumadin.  Stated he did it again over the weekend and this is likely what is causing the elevated reading.  He also apologized for his wife's behavior last week and why they had to leave without being seen.  He states, she was having arthritic pain and her er "nerves are terrible" and she had the dog in the car.    I informed him that Dr. Damita Dunnings and I must see him in the clinic tomorrow, especially since he has, again, confused his coumadin dosing.  I explained that he clearly has had some changes going on with his management of care and we were concerned.    Patient has appointment with Dr. Damita Dunnings at 12noon tomorrow, 12/13/17, as this was the only time that his wife wanted to bring him.  I will just see him for coag encounter after he checks in with the doctor even though he is on my coumadin schedule for 1130.  I informed him to please be prepared to wait in case needed.  He verbalizes understanding and agrees to come in.

## 2017-12-13 ENCOUNTER — Ambulatory Visit (INDEPENDENT_AMBULATORY_CARE_PROVIDER_SITE_OTHER): Payer: Medicare Other

## 2017-12-13 ENCOUNTER — Encounter: Payer: Self-pay | Admitting: Family Medicine

## 2017-12-13 ENCOUNTER — Ambulatory Visit (INDEPENDENT_AMBULATORY_CARE_PROVIDER_SITE_OTHER): Payer: Medicare Other | Admitting: Family Medicine

## 2017-12-13 DIAGNOSIS — I4891 Unspecified atrial fibrillation: Secondary | ICD-10-CM

## 2017-12-13 DIAGNOSIS — Z7901 Long term (current) use of anticoagulants: Secondary | ICD-10-CM

## 2017-12-13 LAB — POCT INR: INR: 4

## 2017-12-13 NOTE — Telephone Encounter (Signed)
Thanks

## 2017-12-13 NOTE — Patient Instructions (Signed)
INR TODAY 4.0.   *Patient thinks he held his dose yesterday but not sure.  He had mixed up his wife's sleeping pills over the weekend with his coumadin but states he has fixed that now and has started using a pill box per my insistence last week.    Patient is to hold coumadin today (3/12) and then resume taking 1/2 pill (2mg ) daily EXCEPT for 1 pill (4mg ) on Tuesdays only. Recheck in 1 week.  Patient made aware. Re-check in 1 week.

## 2017-12-13 NOTE — Patient Instructions (Signed)
Use the pillbox in the meantime and I'll check with [facility] in the meantime.  Take care.  Glad to see you.

## 2017-12-14 NOTE — Assessment & Plan Note (Signed)
Anticoagulated.  See above.  He has to do a better job managing his Coumadin.  He thought he was not depressed and did not want treatment for this.  His memory appears normal.  He does have significant stressors noted.  See discussion above.  He is aware of potential death or disability related to Coumadin mismanagement on his part.  He needs to come in here for INR is in the near future.  He understands all of that.  I wish him the best.  He declined further treatment for his situation at this point.
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
## 2017-12-14 NOTE — Progress Notes (Signed)
Agree. Thanks

## 2017-12-19 DIAGNOSIS — Z7901 Long term (current) use of anticoagulants: Secondary | ICD-10-CM | POA: Insufficient documentation

## 2017-12-22 ENCOUNTER — Ambulatory Visit: Payer: Medicare Other

## 2017-12-26 ENCOUNTER — Other Ambulatory Visit: Payer: Self-pay | Admitting: Family Medicine

## 2017-12-26 ENCOUNTER — Other Ambulatory Visit: Payer: Self-pay | Admitting: Cardiovascular Disease

## 2017-12-26 DIAGNOSIS — I4891 Unspecified atrial fibrillation: Secondary | ICD-10-CM | POA: Diagnosis not present

## 2017-12-27 ENCOUNTER — Ambulatory Visit: Payer: Self-pay

## 2017-12-27 DIAGNOSIS — I4891 Unspecified atrial fibrillation: Secondary | ICD-10-CM

## 2017-12-27 DIAGNOSIS — H353221 Exudative age-related macular degeneration, left eye, with active choroidal neovascularization: Secondary | ICD-10-CM | POA: Diagnosis not present

## 2017-12-27 DIAGNOSIS — Z7901 Long term (current) use of anticoagulants: Secondary | ICD-10-CM

## 2017-12-27 LAB — PROTIME-INR
INR: 4.5 — ABNORMAL HIGH (ref 0.8–1.2)
PROTHROMBIN TIME: 42.9 s — AB (ref 9.1–12.0)

## 2017-12-27 LAB — POCT INR: INR: 4.5

## 2017-12-27 NOTE — Patient Instructions (Signed)
INR TODAY 4.5  Patient no showed for coumadin clinic appointment last Thursday, instead went to [place] on Monday for check. INR is still out of range.  I  discussed this with patient and importance of keeping appointments here in the clinic at least until his levels stabilize.  I explained that it is very important that I see him face to face and give him written instructions as he has not been able to keep things straight of recent over the phone.  Patient verbalizes understanding and agreement.   Instructions given today to hold coumadin today (3/26) and tomorrow 3/27 and then decrease dose to taking:  2mg  daily EXCEPT for 1mg  on Sundays.  Recheck in 1 week.  Patient was able to repeat back correct instructions.  *RX sent in for 1mg  tablets to pharmacy.  Patient aware to pick up.    He is also aware to bring his pill bottles and pill box with him to Coumadin Clinic appointment next week and verbalizes understanding that he must keep clinic appointment.

## 2017-12-28 NOTE — Progress Notes (Signed)
Agree. Thanks

## 2017-12-29 ENCOUNTER — Telehealth: Payer: Self-pay | Admitting: Family Medicine

## 2017-12-29 NOTE — Telephone Encounter (Signed)
Noted, called patient to clarify instructions as note below is not correct.  Patient is very confused and mixed up instructions that I gave him on Tuesday.    Due to confusion, I will not send in the 1 mg tablets.  Instead, I will just have patient take the decreased dose of 1/2 pill (2mg ) daily as he is already accustomed to taking 1/2 pill most days.  This does not quite decrease him the full amount, but feel this combined with his 2 day hold will be sufficient until recheck here in the clinic on Tuesday.  Patient repeats back instructions and agrees to appt on Tuesday.  Will forward to Dr. Damita Dunnings as Juluis Rainier.   Thanks.

## 2017-12-29 NOTE — Telephone Encounter (Signed)
Agreed. Mandy and I talked about this.  He'll have to keep f/u and keep check on INR for [place] to continue seeing him.

## 2017-12-29 NOTE — Telephone Encounter (Signed)
Copied from [place]. Topic: Quick Communication - See Telephone Encounter >> Dec 29, 2017 10:55 AM Ahmed Prima L wrote: CRM for notification. See Telephone encounter for: 12/29/17.  Patient said that [facility] RN was suppose to call in his warfarin (COUMADIN) 4 MG tablet but was going to switch it to 1mg . Please advise  Golva [place], [place]

## 2018-01-01 ENCOUNTER — Other Ambulatory Visit: Payer: Self-pay | Admitting: Cardiovascular Disease

## 2018-01-02 ENCOUNTER — Other Ambulatory Visit: Payer: Self-pay

## 2018-01-02 ENCOUNTER — Ambulatory Visit: Payer: Medicare Other | Admitting: Physician Assistant

## 2018-01-02 ENCOUNTER — Observation Stay (HOSPITAL_COMMUNITY)
Admission: EM | Admit: 2018-01-02 | Discharge: 2018-01-05 | Disposition: A | Discharge status: Home / Self Care | Payer: Medicare Other | Source: Home / Self Care | Attending: Emergency Medicine | Admitting: Emergency Medicine

## 2018-01-02 ENCOUNTER — Encounter: Payer: Self-pay | Admitting: Emergency Medicine

## 2018-01-02 DIAGNOSIS — I509 Heart failure, unspecified: Secondary | ICD-10-CM

## 2018-01-02 DIAGNOSIS — N400 Enlarged prostate without lower urinary tract symptoms: Secondary | ICD-10-CM | POA: Diagnosis not present

## 2018-01-02 DIAGNOSIS — Z79899 Other long term (current) drug therapy: Secondary | ICD-10-CM | POA: Insufficient documentation

## 2018-01-02 DIAGNOSIS — I2510 Atherosclerotic heart disease of native coronary artery without angina pectoris: Secondary | ICD-10-CM

## 2018-01-02 DIAGNOSIS — I482 Chronic atrial fibrillation: Secondary | ICD-10-CM | POA: Diagnosis not present

## 2018-01-02 DIAGNOSIS — E871 Hypo-osmolality and hyponatremia: Secondary | ICD-10-CM

## 2018-01-02 DIAGNOSIS — Z903 Acquired absence of stomach [part of]: Secondary | ICD-10-CM | POA: Diagnosis not present

## 2018-01-02 DIAGNOSIS — I4891 Unspecified atrial fibrillation: Secondary | ICD-10-CM

## 2018-01-02 DIAGNOSIS — R531 Weakness: Secondary | ICD-10-CM | POA: Diagnosis not present

## 2018-01-02 DIAGNOSIS — Z888 Allergy status to other drugs, medicaments and biological substances status: Secondary | ICD-10-CM | POA: Insufficient documentation

## 2018-01-02 DIAGNOSIS — E7800 Pure hypercholesterolemia, unspecified: Secondary | ICD-10-CM | POA: Insufficient documentation

## 2018-01-02 DIAGNOSIS — K219 Gastro-esophageal reflux disease without esophagitis: Secondary | ICD-10-CM | POA: Diagnosis not present

## 2018-01-02 DIAGNOSIS — Z8711 Personal history of peptic ulcer disease: Secondary | ICD-10-CM

## 2018-01-02 DIAGNOSIS — G4730 Sleep apnea, unspecified: Secondary | ICD-10-CM | POA: Diagnosis not present

## 2018-01-02 DIAGNOSIS — Z7901 Long term (current) use of anticoagulants: Secondary | ICD-10-CM | POA: Diagnosis not present

## 2018-01-02 DIAGNOSIS — K625 Hemorrhage of anus and rectum: Secondary | ICD-10-CM | POA: Diagnosis not present

## 2018-01-02 DIAGNOSIS — K31811 Angiodysplasia of stomach and duodenum with bleeding: Secondary | ICD-10-CM

## 2018-01-02 DIAGNOSIS — K921 Melena: Secondary | ICD-10-CM | POA: Insufficient documentation

## 2018-01-02 DIAGNOSIS — K5521 Angiodysplasia of colon with hemorrhage: Secondary | ICD-10-CM | POA: Diagnosis not present

## 2018-01-02 DIAGNOSIS — K31819 Angiodysplasia of stomach and duodenum without bleeding: Secondary | ICD-10-CM | POA: Diagnosis not present

## 2018-01-02 DIAGNOSIS — R601 Generalized edema: Secondary | ICD-10-CM | POA: Diagnosis not present

## 2018-01-02 DIAGNOSIS — I110 Hypertensive heart disease with heart failure: Secondary | ICD-10-CM

## 2018-01-02 DIAGNOSIS — D649 Anemia, unspecified: Secondary | ICD-10-CM | POA: Diagnosis not present

## 2018-01-02 DIAGNOSIS — Z934 Other artificial openings of gastrointestinal tract status: Secondary | ICD-10-CM | POA: Insufficient documentation

## 2018-01-02 DIAGNOSIS — I5022 Chronic systolic (congestive) heart failure: Secondary | ICD-10-CM | POA: Diagnosis not present

## 2018-01-02 DIAGNOSIS — K922 Gastrointestinal hemorrhage, unspecified: Secondary | ICD-10-CM

## 2018-01-02 DIAGNOSIS — I10 Essential (primary) hypertension: Secondary | ICD-10-CM | POA: Diagnosis not present

## 2018-01-02 DIAGNOSIS — D509 Iron deficiency anemia, unspecified: Secondary | ICD-10-CM | POA: Diagnosis not present

## 2018-01-02 DIAGNOSIS — G4733 Obstructive sleep apnea (adult) (pediatric): Secondary | ICD-10-CM | POA: Diagnosis not present

## 2018-01-02 DIAGNOSIS — R600 Localized edema: Secondary | ICD-10-CM | POA: Diagnosis not present

## 2018-01-02 DIAGNOSIS — H5461 Unqualified visual loss, right eye, normal vision left eye: Secondary | ICD-10-CM | POA: Diagnosis not present

## 2018-01-02 LAB — COMPREHENSIVE METABOLIC PANEL
ALT: 24 U/L (ref 17–63)
AST: 31 U/L (ref 15–41)
Albumin: 3.6 g/dL (ref 3.5–5.0)
Alkaline Phosphatase: 62 U/L (ref 38–126)
Anion gap: 7 (ref 5–15)
BUN: 12 mg/dL (ref 6–20)
CO2: 25 mmol/L (ref 22–32)
CREATININE: 0.72 mg/dL (ref 0.61–1.24)
Calcium: 8.5 mg/dL — ABNORMAL LOW (ref 8.9–10.3)
Chloride: 94 mmol/L — ABNORMAL LOW (ref 101–111)
GFR calc non Af Amer: >60 mL/min (ref >60)
Glucose, Bld: 110 mg/dL — ABNORMAL HIGH (ref 65–99)
Potassium: 4.2 mmol/L (ref 3.5–5.1)
SODIUM: 126 mmol/L — AB (ref 135–145)
Total Bilirubin: 0.8 mg/dL (ref 0.3–1.2)
Total Protein: 6.7 g/dL (ref 6.5–8.1)

## 2018-01-02 LAB — PROTIME-INR
INR: 1.92
PROTHROMBIN TIME: 21.8 s — AB (ref 11.4–15.2)

## 2018-01-02 LAB — CBC
HCT: 35.2 % — ABNORMAL LOW (ref 40.0–52.0)
Hemoglobin: 11.4 g/dL — ABNORMAL LOW (ref 13.0–18.0)
MCH: 26.6 pg (ref 26.0–34.0)
MCHC: 32.3 g/dL (ref 32.0–36.0)
MCV: 82.4 fL (ref 80.0–100.0)
PLATELETS: 216 10*3/uL (ref 150–440)
RBC: 4.27 MIL/uL — AB (ref 4.40–5.90)
RDW: 17.1 % — ABNORMAL HIGH (ref 11.5–14.5)
WBC: 3.5 10*3/uL — ABNORMAL LOW (ref 3.8–10.6)

## 2018-01-02 LAB — HEMOGLOBIN: Hemoglobin: 11.2 g/dL — ABNORMAL LOW (ref 13.0–18.0)

## 2018-01-02 LAB — TYPE AND SCREEN
ABO/RH(D): O POS
Antibody Screen: NEGATIVE

## 2018-01-02 MED ORDER — PANTOPRAZOLE SODIUM 40 MG PO TBEC
40.0000 mg | DELAYED_RELEASE_TABLET | Freq: Two times a day (BID) | ORAL | Status: DC
  Administered 2018-01-03 – 2018-01-05 (×5): 40 mg via ORAL
  Filled 2018-01-02 (×5): qty 1

## 2018-01-02 MED ORDER — ZOLPIDEM TARTRATE 5 MG PO TABS
5.0000 mg | ORAL_TABLET | Freq: Every evening | ORAL | Status: DC | PRN
  Administered 2018-01-02 – 2018-01-04 (×3): 5 mg via ORAL
  Filled 2018-01-02 (×3): qty 1

## 2018-01-02 MED ORDER — OMEGA-3-ACID ETHYL ESTERS 1 G PO CAPS
1.0000 g | ORAL_CAPSULE | Freq: Every day | ORAL | Status: DC
  Administered 2018-01-02 – 2018-01-05 (×4): 1 g via ORAL
  Filled 2018-01-02 (×4): qty 1

## 2018-01-02 MED ORDER — ENALAPRIL MALEATE 5 MG PO TABS
5.0000 mg | ORAL_TABLET | Freq: Every day | ORAL | Status: DC
  Administered 2018-01-02 – 2018-01-05 (×4): 5 mg via ORAL
  Filled 2018-01-02: qty 1
  Filled 2018-01-02 (×3): qty 0.5

## 2018-01-02 MED ORDER — FERROUS SULFATE 325 (65 FE) MG PO TABS
325.0000 mg | ORAL_TABLET | Freq: Two times a day (BID) | ORAL | Status: DC
  Administered 2018-01-02 – 2018-01-05 (×5): 325 mg via ORAL
  Filled 2018-01-02 (×7): qty 1

## 2018-01-02 MED ORDER — VITAMIN D 1000 UNITS PO TABS
1000.0000 [IU] | ORAL_TABLET | Freq: Every day | ORAL | Status: DC
  Administered 2018-01-02 – 2018-01-05 (×4): 1000 [IU] via ORAL
  Filled 2018-01-02 (×4): qty 1

## 2018-01-02 MED ORDER — VITAMIN B-12 100 MCG PO TABS
100.0000 ug | ORAL_TABLET | Freq: Every day | ORAL | Status: DC
Start: 2018-01-02 — End: 2018-01-05
  Administered 2018-01-02 – 2018-01-05 (×4): 100 ug via ORAL
  Filled 2018-01-02 (×4): qty 1

## 2018-01-02 MED ORDER — PANTOPRAZOLE SODIUM 40 MG PO TBEC
40.0000 mg | DELAYED_RELEASE_TABLET | Freq: Every day | ORAL | Status: DC
  Administered 2018-01-02: 19:00:00 40 mg via ORAL
  Filled 2018-01-02: qty 1

## 2018-01-02 MED ORDER — AMIODARONE HCL 200 MG PO TABS
200.0000 mg | ORAL_TABLET | Freq: Every day | ORAL | Status: DC
  Administered 2018-01-02 – 2018-01-05 (×4): 200 mg via ORAL
  Filled 2018-01-02 (×4): qty 1

## 2018-01-02 MED ORDER — ADULT MULTIVITAMIN W/MINERALS CH
1.0000 | ORAL_TABLET | Freq: Every day | ORAL | Status: DC
  Administered 2018-01-02 – 2018-01-05 (×4): 1 via ORAL
  Filled 2018-01-02 (×4): qty 1

## 2018-01-02 MED ORDER — FINASTERIDE 5 MG PO TABS
5.0000 mg | ORAL_TABLET | Freq: Every day | ORAL | Status: DC
  Administered 2018-01-02 – 2018-01-05 (×4): 5 mg via ORAL
  Filled 2018-01-02 (×4): qty 1

## 2018-01-02 MED ORDER — SPIRONOLACTONE 25 MG PO TABS
25.0000 mg | ORAL_TABLET | Freq: Every day | ORAL | Status: DC
  Administered 2018-01-02 – 2018-01-05 (×4): 25 mg via ORAL
  Filled 2018-01-02 (×4): qty 1

## 2018-01-02 MED ORDER — PANTOPRAZOLE SODIUM 40 MG IV SOLR
40.0000 mg | Freq: Once | INTRAVENOUS | Status: AC
  Administered 2018-01-02: 40 mg via INTRAVENOUS
  Filled 2018-01-02: qty 40

## 2018-01-02 MED ORDER — METOPROLOL SUCCINATE ER 25 MG PO TB24
25.0000 mg | ORAL_TABLET | Freq: Every day | ORAL | Status: DC
  Administered 2018-01-02 – 2018-01-05 (×4): 25 mg via ORAL
  Filled 2018-01-02 (×4): qty 1

## 2018-01-02 MED ORDER — SODIUM CHLORIDE 0.9 % IV BOLUS
250.0000 mL | Freq: Once | INTRAVENOUS | Status: AC
  Administered 2018-01-02: 250 mL via INTRAVENOUS

## 2018-01-02 MED ORDER — DOCUSATE SODIUM 100 MG PO CAPS
100.0000 mg | ORAL_CAPSULE | Freq: Two times a day (BID) | ORAL | Status: DC | PRN
  Administered 2018-01-02: 100 mg via ORAL
  Filled 2018-01-02: qty 1

## 2018-01-02 MED ORDER — POLYVINYL ALCOHOL 1.4 % OP SOLN
1.0000 [drp] | Freq: Every morning | OPHTHALMIC | Status: DC
  Administered 2018-01-03 – 2018-01-04 (×2): 1 [drp] via OPHTHALMIC
  Filled 2018-01-02: qty 15

## 2018-01-02 MED ORDER — CALCIUM CARBONATE ANTACID 500 MG PO CHEW
1.0000 | CHEWABLE_TABLET | Freq: Once | ORAL | Status: AC
  Administered 2018-01-02: 23:00:00 200 mg via ORAL
  Filled 2018-01-02: qty 1

## 2018-01-02 MED ORDER — SODIUM CHLORIDE 0.9 % IV SOLN
INTRAVENOUS | Status: DC
  Administered 2018-01-03 – 2018-01-05 (×4): via INTRAVENOUS

## 2018-01-02 MED ORDER — VITAMIN C 500 MG PO TABS
500.0000 mg | ORAL_TABLET | Freq: Every day | ORAL | Status: DC
Start: 2018-01-02 — End: 2018-01-05
  Administered 2018-01-02 – 2018-01-05 (×4): 500 mg via ORAL
  Filled 2018-01-02 (×4): qty 1

## 2018-01-02 MED ORDER — TAMSULOSIN HCL 0.4 MG PO CAPS
0.4000 mg | ORAL_CAPSULE | Freq: Every day | ORAL | Status: DC
  Administered 2018-01-02 – 2018-01-05 (×4): 0.4 mg via ORAL
  Filled 2018-01-02 (×4): qty 1

## 2018-01-02 NOTE — Plan of Care (Signed)
No stool noted.   

## 2018-01-02 NOTE — ED Notes (Signed)
Hospitalist to bedside at this time 

## 2018-01-02 NOTE — H&P (Signed)
[facility] NAME: [name]    MR#:  [id number]  DATE OF BIRTH:  1933-04-26  DATE OF ADMISSION:  01/02/2018  PRIMARY CARE PHYSICIAN: Tonia Ghent, MD   REQUESTING/REFERRING PHYSICIAN: Quale  CHIEF COMPLAINT:   Chief Complaint  Patient presents with  . Rectal Bleeding    HISTORY OF PRESENT ILLNESS: [name]  is a 84 y.o. male with a known history of atrial fibrillation, CHF, coronary artery disease, esophageal reflux, hypertension, iron deficiency anemia- takes warfarin at home. For last 4-5 days noted dark stool. Denies any associated complain of abdominal pain, nausea, vomiting. Tool was much darker today, concerned with this he came to  Emergency room. His hemoglobin and vitals were stable, but he is on warfarin, so ER physician suggested to monitor in hospital.  PAST MEDICAL HISTORY:   Past Medical History:  Diagnosis Date  . Atrial fibrillation ([place])   . Blind right eye   . CHF (congestive heart failure) ([place])   . Coronary atherosclerosis of unspecified type of vessel, native or graft 2008   cardioversion. Echo mod LVH EF 25%, Triv AR  Mild LAE, RAE 11/15/*11  . Diaphragmatic hernia without mention of obstruction or gangrene   . Diverticulosis of colon (without mention of hemorrhage)   . Dizziness and giddiness   . Encounter for long-term (current) use of other medications   . Encounter for therapeutic drug monitoring   . Esophageal reflux   . Gastritis 12/18/08   EGD, 3 clips remaining (Dr. Vira Agar)  . Hypertension   . Iron deficiency anemia, unspecified   . Left heart failure ([place])   . Long term (current) use of anticoagulants   . Macular degeneration   . Personal history of other diseases of digestive system   . Personal history of peptic ulcer disease   . Pure hypercholesterolemia   . Sprain of neck   . Unspecified hemorrhoids without mention of complication   . Unspecified sleep apnea   .  Upper GI bleed 7/15-7/20/09   Hosp.  Elevated INR, coumadin stopped sinusitis    PAST SURGICAL HISTORY:  Past Surgical History:  Procedure Laterality Date  . APPENDECTOMY    . CARDIAC CATHETERIZATION  1996   [facility]  . CARDIAC CATHETERIZATION    . CARDIAC CATHETERIZATION    . CARDIOVERSION  12/17/2011   Procedure: CARDIOVERSION;  Surgeon: Hillary Bow, MD;  Location: [facility];  Service: Cardiovascular;  Laterality: N/A;  . CATARACT EXTRACTION  10/13/08   left  . CHOLECYSTECTOMY    . MITRAL VALVE ANNULOPLASTY  1996  . MITRAL VALVE REPAIR  1995  . PARTIAL GASTRECTOMY      SOCIAL HISTORY:  Social History   Tobacco Use  . Smoking status: Never Smoker  . Smokeless tobacco: Never Used  Substance Use Topics  . Alcohol use: Yes    Comment: socially, beer     FAMILY HISTORY:  Family History  Problem Relation Age of Onset  . Cancer Father        lung  . Colon cancer Neg Hx   . Prostate cancer Neg Hx     DRUG ALLERGIES:  Allergies  Allergen Reactions  . Atorvastatin Other (See Comments)    Other reaction(s): Unknown REACTION:muscular soreness  . Statins     Muscle aches on mult statins    REVIEW OF SYSTEMS:   CONSTITUTIONAL: No fever, fatigue or weakness.  EYES: No blurred or double vision.  EARS,  NOSE, AND THROAT: No tinnitus or ear pain.  RESPIRATORY: No cough, shortness of breath, wheezing or hemoptysis.  CARDIOVASCULAR: No chest pain, orthopnea, edema.  GASTROINTESTINAL: No nausea, vomiting, diarrhea or abdominal pain.  GENITOURINARY: No dysuria, hematuria.  ENDOCRINE: No polyuria, nocturia,  HEMATOLOGY: No anemia, easy bruising or bleeding SKIN: No rash or lesion. MUSCULOSKELETAL: No joint pain or arthritis.   NEUROLOGIC: No tingling, numbness, weakness.  PSYCHIATRY: No anxiety or depression.   MEDICATIONS AT HOME:  Prior to Admission medications   Medication Sig Start Date End Date Taking? Authorizing Provider  amiodarone (PACERONE) 200 MG tablet TAKE 1  TABLET BY MOUTH ONCE DAILY 11/08/17  Yes Wellington Hampshire, MD  cholecalciferol (VITAMIN D) 1000 UNITS tablet Take 1,000 Units by mouth daily.   Yes [provider]  cyanocobalamin 100 MCG tablet Take 100 mcg by mouth daily.     Yes [provider]  enalapril (VASOTEC) 5 MG tablet TAKE ONE TABLET BY MOUTH ONCE DAILY 11/17/17  Yes Wellington Hampshire, MD  eplerenone (INSPRA) 25 MG tablet TAKE 1 TABLET BY MOUTH ONCE DAILY 01/02/18  Yes Wellington Hampshire, MD  ferrous sulfate 325 (65 FE) MG tablet Take 325 mg by mouth 2 (two) times daily.   Yes [provider]  finasteride (PROSCAR) 5 MG tablet Take 1 tablet (5 mg total) by mouth daily. 11/03/17  Yes Tonia Ghent, MD  fish oil-omega-3 fatty acids 1000 MG capsule Take 1 g by mouth daily.   Yes [provider]  furosemide (LASIX) 40 MG tablet Take 1 tablet (40 mg total) by mouth daily as needed. 03/18/15  Yes Wellington Hampshire, MD  hydroxypropyl methylcellulose (ISOPTO TEARS) 2.5 % ophthalmic solution Place 1 drop into both eyes every morning.   Yes [provider]  metoprolol succinate (TOPROL-XL) 50 MG 24 hr tablet TAKE ONE TABLET BY MOUTH ONCE DAILY WITH MEALS 05/17/17  Yes Tonia Ghent, MD  Multiple Vitamin (MULTIVITAMIN) tablet Take 1 tablet by mouth daily.     Yes [provider]  omeprazole (PRILOSEC) 20 MG capsule Take 1 capsule (20 mg total) by mouth daily. 06/24/15  Yes Minna Merritts, MD  tamsulosin (FLOMAX) 0.4 MG CAPS capsule Take 1 capsule (0.4 mg total) by mouth daily. 10/19/17  Yes Tonia Ghent, MD  vitamin C (ASCORBIC ACID) 500 MG tablet Take 500 mg by mouth daily.   Yes [provider]  warfarin (COUMADIN) 4 MG tablet TAKE AS DIRECTED BY ANTICOAGULATION CLINIC 06/14/17  Yes Tonia Ghent, MD      PHYSICAL EXAMINATION:   VITAL SIGNS: Blood pressure (!) 148/78, pulse 74, temperature 97.6 F (36.4 C), temperature source Oral, resp. rate 18, height 5\' 11"  (1.803 m),  weight 81.6 kg (180 lb), SpO2 100 %.  GENERAL:  84 y.o.-year-old patient lying in the bed with no acute distress.  EYES: Pupils equal, round, reactive to light and accommodation. No scleral icterus. Extraocular muscles intact.  HEENT: Head atraumatic, normocephalic. Oropharynx and nasopharynx clear.  NECK:  Supple, no jugular venous distention. No thyroid enlargement, no tenderness.  LUNGS: Normal breath sounds bilaterally, no wheezing, rales,rhonchi or crepitation. No use of accessory muscles of respiration.  CARDIOVASCULAR: S1, S2 normal. No murmurs, rubs, or gallops.  ABDOMEN: Soft, nontender, nondistended. Bowel sounds present. No organomegaly or mass.  EXTREMITIES: No pedal edema, cyanosis, or clubbing.  NEUROLOGIC: Cranial nerves II through XII are intact. Muscle strength 5/5 in all extremities. Sensation intact. Gait not checked.  PSYCHIATRIC:  The patient is alert and oriented x 3.  SKIN: No obvious rash, lesion, or ulcer.   LABORATORY PANEL:   CBC Recent Labs  Lab 01/02/18 1110 01/02/18 2219  WBC 3.5*  --   HGB 11.4* 11.2*  HCT 35.2*  --   PLT 216  --   MCV 82.4  --   MCH 26.6  --   MCHC 32.3  --   RDW 17.1*  --    ------------------------------------------------------------------------------------------------------------------  Chemistries  Recent Labs  Lab 01/02/18 1110  NA 126*  K 4.2  CL 94*  CO2 25  GLUCOSE 110*  BUN 12  CREATININE 0.72  CALCIUM 8.5*  AST 31  ALT 24  ALKPHOS 62  BILITOT 0.8   ------------------------------------------------------------------------------------------------------------------ estimated creatinine clearance is 75.8 mL/min (by C-G formula based on SCr of 0.72 mg/dL). ------------------------------------------------------------------------------------------------------------------ No results for input(s): TSH, T4TOTAL, T3FREE, THYROIDAB in the last 72 hours.  Invalid input(s): FREET3   Coagulation profile Recent Labs   Lab 12/27/17 01/02/18 1110  INR 4.5 1.92   ------------------------------------------------------------------------------------------------------------------- No results for input(s): DDIMER in the last 72 hours. -------------------------------------------------------------------------------------------------------------------  Cardiac Enzymes No results for input(s): CKMB, TROPONINI, MYOGLOBIN in the last 168 hours.  Invalid input(s): CK ------------------------------------------------------------------------------------------------------------------ Invalid input(s): POCBNP  ---------------------------------------------------------------------------------------------------------------  Urinalysis No results found for: COLORURINE, APPEARANCEUR, LABSPEC, PHURINE, GLUCOSEU, HGBUR, BILIRUBINUR, KETONESUR, PROTEINUR, UROBILINOGEN, NITRITE, LEUKOCYTESUR   RADIOLOGY: No results found.  EKG: Orders placed or performed in visit on 11/19/16  . EKG 12-Lead    IMPRESSION AND PLAN:  * acute GI bleed   Guaiac positive stool as  Per ER physician   Hemoglobin stable for now.   Hold warfarin and anticoagulation.   Follow hemoglobin, GI consult.   Protonix twice a day.  * hypertension   Continue all home medications.  * coronary artery disease   Continue metoprolol   He has not on statin,we may need to start before discharge.  * atrial fibrillation   Continue amiodarone, metoprolol.   Hold warfarin.  * hyponatremia   IV fluid, recheck tomorrow.        All the records are reviewed and case discussed with ED provider. Management plans discussed with the patient, family and they are in agreement.  CODE STATUS: full.    Code Status Orders  (From admission, onward)        Start     Ordered   01/02/18 1732  Full code  Continuous     01/02/18 1732    Code Status History    This patient has a current code status but no historical code status.    Advance Directive  Documentation     Most Recent Value  Type of Advance Directive  Living will  Pre-existing out of facility DNR order (yellow form or pink MOST form)  -  "MOST" Form in Place?  -     Granddaughter Present in the room during my visit.  TOTAL TIME TAKING CARE OF THIS PATIENT: 45 minutes.    Vaughan Basta M.D on 01/02/2018   Between 7am to 6pm - Pager - [phone]  After 6pm go to [email or web address] - password [email or web address] [facility]  Office  [phone]  CC: Primary care physician; Tonia Ghent, MD   Note: This dictation was prepared with Dragon dictation along with smaller phrase technology. Any transcriptional errors that result from this process are unintentional.

## 2018-01-02 NOTE — Progress Notes (Signed)
Family Meeting Note  Advance Directive:yes  Today a meeting took place with the Patient and grand daughter.  The following clinical team members were present during this meeting:MD  The following were discussed:Patient's diagnosis: GI bleed, A fib , Patient's progosis: Unable to determine and Goals for treatment: Full Code  Additional follow-up to be provided: Gi consult  Time spent during discussion:20 minutes  Vaughan Basta, MD

## 2018-01-02 NOTE — ED Provider Notes (Signed)
[facility] Emergency Department Provider Note   ____________________________________________   First MD Initiated Contact with Patient 01/02/18 1504     (approximate)  I have reviewed the triage vital signs and the nursing notes.   HISTORY  Chief Complaint Rectal Bleeding    HPI [name] is a 84 y.o. male presents for evaluation for dark stools.  Patient reports he thinks his bleeding from his stool.  He has had a few dark stools over the last couple of days, but this morning he looked after going to the bathroom he reports he had a black bowel movement.  Patient reports is been feeling slightly more fatigued the last couple of days as well.  He does take Coumadin for A. fib, and had a high Coumadin level but believes that is come back down he was due to have that checked today  No abdominal pain.  No fevers or chills.  No chest pain.  Reports one black bowel movement today.  Also reports she has had a few loose stools last couple of days.  Reports a history of previous GI bleeding and ulcers or tear in his stomach that required emergency evaluation several years ago.  Thinks he could have a bleeding ulcer.  Does not take any blood thinners other than Coumadin.  He avoids ibuprofen and NSAIDs.  Does not take aspirin sees Dr. Vira Agar  Past Medical History:  Diagnosis Date  . Atrial fibrillation ([place])   . Blind right eye   . CHF (congestive heart failure) ([place])   . Coronary atherosclerosis of unspecified type of vessel, native or graft 2008   cardioversion. Echo mod LVH EF 25%, Triv AR  Mild LAE, RAE 11/15/*11  . Diaphragmatic hernia without mention of obstruction or gangrene   . Diverticulosis of colon (without mention of hemorrhage)   . Dizziness and giddiness   . Encounter for long-term (current) use of other medications   . Encounter for therapeutic drug monitoring   . Esophageal reflux   . Gastritis 12/18/08   EGD, 3 clips remaining (Dr.  Vira Agar)  . Hypertension   . Iron deficiency anemia, unspecified   . Left heart failure ([place])   . Long term (current) use of anticoagulants   . Macular degeneration   . Personal history of other diseases of digestive system   . Personal history of peptic ulcer disease   . Pure hypercholesterolemia   . Sprain of neck   . Unspecified hemorrhoids without mention of complication   . Unspecified sleep apnea   . Upper GI bleed 7/15-7/20/09   Hosp.  Elevated INR, coumadin stopped sinusitis    Patient Active Problem List   Diagnosis Date Noted  . Long term (current) use of anticoagulants 12/19/2017  . Rhinitis 11/04/2017  . Bilateral carotid bruits 11/17/2015  . H. pylori infection 10/26/2015  . Persistent atrial fibrillation ([place]) 04/28/2015  . Medicare annual wellness visit, subsequent 01/31/2015  . Advance care planning 01/31/2015  . Long term current use of anticoagulant therapy 01/30/2015  . Abnormal TSH 02/16/2014  . Chronic systolic heart failure ([place]) 08/16/2013  . Fatigue 09/08/2012  . Cardiomyopathy, dilated, nonischemic ([place]) 12/19/2011  . Hypertension 01/20/2011  . ROSACEA 11/12/2009  . BPH with obstruction/lower urinary tract symptoms 09/03/2009  . MITRAL VALVE REPLACEMENT, HX OF 06/11/2009  . OTHER AND UNSPECIFIED MITRAL VALVE DISEASES 03/04/2009  . PEPTIC ULCER DISEASE, HX OF 02/27/2009  . ANEMIA, IRON DEFICIENCY 04/09/2008  . GERD 04/09/2008  . HYPERCHOLESTEROLEMIA 04/08/2008  .  LEFT VENTRICULAR FAILURE 04/08/2008  . HEMORRHOIDS 04/08/2008  . HIATAL HERNIA 04/08/2008  . DIVERTICULOSIS, COLON 04/08/2008  . SLEEP APNEA 04/08/2008  . GASTROINTESTINAL HEMORRHAGE, HX OF 04/08/2008  . Atrial fibrillation ([place]) 02/01/2007    Past Surgical History:  Procedure Laterality Date  . APPENDECTOMY    . CARDIAC CATHETERIZATION  1996   [facility]  . CARDIAC CATHETERIZATION    . CARDIAC CATHETERIZATION    . CARDIOVERSION  12/17/2011   Procedure: CARDIOVERSION;  Surgeon: Hillary Bow, MD;  Location: [facility];  Service: Cardiovascular;  Laterality: N/A;  . CATARACT EXTRACTION  10/13/08   left  . CHOLECYSTECTOMY    . MITRAL VALVE ANNULOPLASTY  1996  . MITRAL VALVE REPAIR  1995  . PARTIAL GASTRECTOMY      Prior to Admission medications   Medication Sig Start Date End Date Taking? Authorizing Provider  amiodarone (PACERONE) 200 MG tablet TAKE 1 TABLET BY MOUTH ONCE DAILY 11/08/17   Wellington Hampshire, MD  cholecalciferol (VITAMIN D) 1000 UNITS tablet Take 1,000 Units by mouth daily.    [provider]  cyanocobalamin 100 MCG tablet Take 100 mcg by mouth daily.      [provider]  enalapril (VASOTEC) 5 MG tablet TAKE ONE TABLET BY MOUTH ONCE DAILY 11/17/17   Wellington Hampshire, MD  eplerenone (INSPRA) 25 MG tablet TAKE 1 TABLET BY MOUTH ONCE DAILY 01/02/18   Wellington Hampshire, MD  ferrous sulfate 325 (65 FE) MG tablet Take 325 mg by mouth 2 (two) times daily.    [provider]  finasteride (PROSCAR) 5 MG tablet Take 1 tablet (5 mg total) by mouth daily. 11/03/17   Tonia Ghent, MD  fish oil-omega-3 fatty acids 1000 MG capsule Take 1 g by mouth daily.    [provider]  furosemide (LASIX) 40 MG tablet Take 1 tablet (40 mg total) by mouth daily as needed. 03/18/15   Wellington Hampshire, MD  hydroxypropyl methylcellulose (ISOPTO TEARS) 2.5 % ophthalmic solution Place 1 drop into both eyes every morning.    [provider]  metoprolol succinate (TOPROL-XL) 50 MG 24 hr tablet TAKE ONE TABLET BY MOUTH ONCE DAILY WITH MEALS 05/17/17   Tonia Ghent, MD  Multiple Vitamin (MULTIVITAMIN) tablet Take 1 tablet by mouth daily.      [provider]  omeprazole (PRILOSEC) 20 MG capsule Take 1 capsule (20 mg total) by mouth daily. 06/24/15   Minna Merritts, MD  tamsulosin (FLOMAX) 0.4 MG CAPS capsule Take 1 capsule (0.4 mg total) by mouth daily. 10/19/17   Tonia Ghent, MD  vitamin C (ASCORBIC ACID) 500 MG tablet Take 500 mg by  mouth daily.    [provider]  warfarin (COUMADIN) 4 MG tablet TAKE AS DIRECTED BY ANTICOAGULATION CLINIC 06/14/17   Tonia Ghent, MD    Allergies Atorvastatin and Statins  Family History  Problem Relation Age of Onset  . Cancer Father        lung  . Colon cancer Neg Hx   . Prostate cancer Neg Hx     Social History Social History   Tobacco Use  . Smoking status: Never Smoker  . Smokeless tobacco: Never Used  Substance Use Topics  . Alcohol use: Yes    Comment: socially, beer   . Drug use: No    Review of Systems Constitutional: No fever/chills Eyes: No visual changes. ENT: No sore throat. Cardiovascular: Denies chest pain. Respiratory: Denies shortness of  breath. Gastrointestinal: No abdominal pain.  No nausea, no vomiting.  No constipation. Genitourinary: Negative for dysuria. Musculoskeletal: Negative for back pain. Skin: Negative for rash. Neurological: Negative for headaches, focal weakness or numbness.    ____________________________________________   PHYSICAL EXAM:  VITAL SIGNS: ED Triage Vitals  Enc Vitals Group     BP 01/02/18 1105 101/75     Pulse Rate 01/02/18 1105 74     Resp 01/02/18 1105 18     Temp 01/02/18 1105 97.8 F (36.6 C)     Temp Source 01/02/18 1105 Oral     SpO2 01/02/18 1105 96 %     Weight 01/02/18 1103 180 lb (81.6 kg)     Height --      Head Circumference --      Peak Flow --      Pain Score 01/02/18 1102 0     Pain Loc --      Pain Edu? --      Excl. in [place]? --     Constitutional: Alert and oriented. Well appearing and in no acute distress. Eyes: Conjunctivae are normal. Head: Atraumatic. Nose: No congestion/rhinnorhea. Mouth/Throat: Mucous membranes are moist. Neck: No stridor.   Cardiovascular: Normal rate, irregular rhythm. Grossly normal heart sounds.  Good peripheral circulation. Respiratory: Normal respiratory effort.  No retractions. Lungs CTAB. Gastrointestinal: Soft and nontender. No  distention. Rectal exam: Normal external examination.  Rectal exam demonstrates an empty vault, only a small amount of stool could be obtained though it is dark and does test positive for heme. Musculoskeletal: No lower extremity tenderness nor edema. Neurologic:  Normal speech and language. No gross focal neurologic deficits are appreciated.  Skin:  Skin is warm, dry and intact. No rash noted. Psychiatric: Mood and affect are normal. Speech and behavior are normal.  ____________________________________________   LABS (all labs ordered are listed, but only abnormal results are displayed)  Labs Reviewed  COMPREHENSIVE METABOLIC PANEL - Abnormal; Notable for the following components:      Result Value   Sodium 126 (*)    Chloride 94 (*)    Glucose, Bld 110 (*)    Calcium 8.5 (*)    All other components within normal limits  CBC - Abnormal; Notable for the following components:   WBC 3.5 (*)    RBC 4.27 (*)    Hemoglobin 11.4 (*)    HCT 35.2 (*)    RDW 17.1 (*)    All other components within normal limits  PROTIME-INR - Abnormal; Notable for the following components:   Prothrombin Time 21.8 (*)    All other components within normal limits  TYPE AND SCREEN   ____________________________________________  EKG   ____________________________________________  RADIOLOGY   ____________________________________________   PROCEDURES  Procedure(s) performed: None  Procedures  Critical Care performed: No  ____________________________________________   INITIAL IMPRESSION / ASSESSMENT AND PLAN / ED COURSE  Pertinent labs & imaging results that were available during my care of the patient were reviewed by me and considered in my medical decision making (see chart for details).  Patient presents for evaluation of fatigue and dark stools.  History of upper GI bleeding in the past.  Reports one episode of vomiting a few days ago that was darker "bilious" as he describes it.   History now with dark stool suspicious for bleeding, particularly seems high risk for upper GI bleed.  He is on Coumadin his INR is 1.9 today.  The present time he is not having any active  stooling and his hemoglobin is stable, though his last dark stools this morning.  Discussed with Dr. Verl Blalock with GI.  Consult is placed, will admit the patient for and GI consultation and serial hemoglobin monitoring.  In addition his sodium is noted to be low at 126, this could be contributing to his fatigue.  I will give a gentle bolus of normal saline 279ml.  Discussed with patient and family, agreeable with plan for admission.      ____________________________________________   FINAL CLINICAL IMPRESSION(S) / ED DIAGNOSES  Final diagnoses:  Melena  Hyponatremia  Anticoagulated      NEW MEDICATIONS STARTED DURING THIS VISIT:  New Prescriptions   No medications on file     Note:  This document was prepared using Dragon voice recognition software and may include unintentional dictation errors.     Delman Kitten, MD 01/02/18 1530

## 2018-01-02 NOTE — ED Triage Notes (Signed)
C/o dark black stools X 3-4 days per pt. Is on coumadin which was just decreased r/t elevated INR.  Also on iron.  NAD at this time. Color WNL.

## 2018-01-03 ENCOUNTER — Ambulatory Visit: Payer: Medicare Other

## 2018-01-03 DIAGNOSIS — K922 Gastrointestinal hemorrhage, unspecified: Secondary | ICD-10-CM | POA: Diagnosis not present

## 2018-01-03 DIAGNOSIS — K921 Melena: Secondary | ICD-10-CM | POA: Diagnosis not present

## 2018-01-03 DIAGNOSIS — E871 Hypo-osmolality and hyponatremia: Secondary | ICD-10-CM | POA: Diagnosis not present

## 2018-01-03 DIAGNOSIS — K31819 Angiodysplasia of stomach and duodenum without bleeding: Secondary | ICD-10-CM | POA: Diagnosis not present

## 2018-01-03 DIAGNOSIS — I10 Essential (primary) hypertension: Secondary | ICD-10-CM | POA: Diagnosis not present

## 2018-01-03 DIAGNOSIS — I4891 Unspecified atrial fibrillation: Secondary | ICD-10-CM | POA: Diagnosis not present

## 2018-01-03 LAB — CBC
HEMATOCRIT: 33.7 % — AB (ref 40.0–52.0)
Hemoglobin: 11 g/dL — ABNORMAL LOW (ref 13.0–18.0)
MCH: 26.6 pg (ref 26.0–34.0)
MCHC: 32.6 g/dL (ref 32.0–36.0)
MCV: 81.5 fL (ref 80.0–100.0)
Platelets: 187 10*3/uL (ref 150–440)
RBC: 4.14 MIL/uL — ABNORMAL LOW (ref 4.40–5.90)
RDW: 16.8 % — AB (ref 11.5–14.5)
WBC: 3.5 10*3/uL — ABNORMAL LOW (ref 3.8–10.6)

## 2018-01-03 LAB — BASIC METABOLIC PANEL
Anion gap: 5 (ref 5–15)
BUN: 9 mg/dL (ref 6–20)
CHLORIDE: 95 mmol/L — AB (ref 101–111)
CO2: 26 mmol/L (ref 22–32)
CREATININE: 0.65 mg/dL (ref 0.61–1.24)
Calcium: 8 mg/dL — ABNORMAL LOW (ref 8.9–10.3)
GFR calc Af Amer: >60 mL/min (ref >60)
GFR calc non Af Amer: >60 mL/min (ref >60)
GLUCOSE: 91 mg/dL (ref 65–99)
POTASSIUM: 4.1 mmol/L (ref 3.5–5.1)
Sodium: 126 mmol/L — ABNORMAL LOW (ref 135–145)

## 2018-01-03 MED ORDER — DEXTROMETHORPHAN POLISTIREX ER 30 MG/5ML PO SUER
30.0000 mg | Freq: Two times a day (BID) | ORAL | Status: DC
  Administered 2018-01-04 – 2018-01-05 (×2): 30 mg via ORAL
  Filled 2018-01-03 (×6): qty 5

## 2018-01-03 MED ORDER — DM-GUAIFENESIN ER 30-600 MG PO TB12: 1.0000 | ORAL_TABLET | Freq: Two times a day (BID) | ORAL | Status: DC

## 2018-01-03 MED ORDER — GUAIFENESIN ER 600 MG PO TB12
600.0000 mg | ORAL_TABLET | Freq: Two times a day (BID) | ORAL | Status: DC
  Administered 2018-01-03 – 2018-01-05 (×4): 600 mg via ORAL
  Filled 2018-01-03 (×4): qty 1

## 2018-01-03 MED ORDER — ACETAMINOPHEN 325 MG PO TABS
650.0000 mg | ORAL_TABLET | Freq: Four times a day (QID) | ORAL | Status: DC | PRN
Start: 2018-01-03 — End: 2018-01-05
  Filled 2018-01-03: qty 2

## 2018-01-03 NOTE — Consult Note (Signed)
Jeremy Lame, MD [facility]  [address] [place], [place] [address] Phone: [phone] Fax : [phone]  Consultation  Referring Provider:     Dr. Posey Pronto Primary Care Physician:  Tonia Ghent, MD Primary Gastroenterologist:  Dr. Vira Agar         Reason for Consultation:     Melena  Date of Admission:  01/02/2018 Date of Consultation:  01/03/2018         HPI:   [name] is a 84 y.o. male who was admitted with dark stools.  The patient reports that he had dark stools for 1 week and decided to come in.  The patient also reports that he has had 3 bowel movements today without any blood or dark material seen.  The patient has a history of a GI bleed back in 2010.  He is presently on warfarin.  The patient was found to have a hemoglobin of 11.4 on admission that was 11.1 today.  His baseline has been anywhere between 9.1 and 12.4.  The patient was found to have guaiac positive stools in the emergency room and I am now being asked to see the patient for his GI bleed.  Past Medical History:  Diagnosis Date  . Atrial fibrillation ([place])   . Blind right eye   . CHF (congestive heart failure) ([place])   . Coronary atherosclerosis of unspecified type of vessel, native or graft 2008   cardioversion. Echo mod LVH EF 25%, [place] AR  Mild LAE, RAE 11/15/*11  . Diaphragmatic hernia without mention of obstruction or gangrene   . Diverticulosis of colon (without mention of hemorrhage)   . Dizziness and giddiness   . Encounter for long-term (current) use of other medications   . Encounter for therapeutic drug monitoring   . Esophageal reflux   . Gastritis 12/18/08   EGD, 3 clips remaining (Dr. Vira Agar)  . Hypertension   . Iron deficiency anemia, unspecified   . Left heart failure ([place])   . Long term (current) use of anticoagulants   . Macular degeneration   . Personal history of other diseases of digestive system   . Personal history of peptic ulcer disease   . Pure  hypercholesterolemia   . Sprain of neck   . Unspecified hemorrhoids without mention of complication   . Unspecified sleep apnea   . Upper GI bleed 7/15-7/20/09   Hosp.  Elevated INR, coumadin stopped sinusitis    Past Surgical History:  Procedure Laterality Date  . APPENDECTOMY    . CARDIAC CATHETERIZATION  1996   [facility]  . CARDIAC CATHETERIZATION    . CARDIAC CATHETERIZATION    . CARDIOVERSION  12/17/2011   Procedure: CARDIOVERSION;  Surgeon: Hillary Bow, MD;  Location: [place];  Service: Cardiovascular;  Laterality: N/A;  . CATARACT EXTRACTION  10/13/08   left  . CHOLECYSTECTOMY    . MITRAL VALVE ANNULOPLASTY  1996  . MITRAL VALVE REPAIR  1995  . PARTIAL GASTRECTOMY      Prior to Admission medications   Medication Sig Start Date End Date Taking? Authorizing Provider  amiodarone (PACERONE) 200 MG tablet TAKE 1 TABLET BY MOUTH ONCE DAILY 11/08/17  Yes Wellington Hampshire, MD  cholecalciferol (VITAMIN D) 1000 UNITS tablet Take 1,000 Units by mouth daily.   Yes [provider]  cyanocobalamin 100 MCG tablet Take 100 mcg by mouth daily.     Yes [provider]  enalapril (VASOTEC) 5 MG tablet TAKE ONE TABLET BY  MOUTH ONCE DAILY 11/17/17  Yes Wellington Hampshire, MD  eplerenone (INSPRA) 25 MG tablet TAKE 1 TABLET BY MOUTH ONCE DAILY 01/02/18  Yes Wellington Hampshire, MD  ferrous sulfate 325 (65 FE) MG tablet Take 325 mg by mouth 2 (two) times daily.   Yes [provider]  finasteride (PROSCAR) 5 MG tablet Take 1 tablet (5 mg total) by mouth daily. 11/03/17  Yes Tonia Ghent, MD  fish oil-omega-3 fatty acids 1000 MG capsule Take 1 g by mouth daily.   Yes [provider]  furosemide (LASIX) 40 MG tablet Take 1 tablet (40 mg total) by mouth daily as needed. 03/18/15  Yes Wellington Hampshire, MD  hydroxypropyl methylcellulose (ISOPTO TEARS) 2.5 % ophthalmic solution Place 1 drop into both eyes every morning.   Yes [provider]  metoprolol succinate  (TOPROL-XL) 50 MG 24 hr tablet TAKE ONE TABLET BY MOUTH ONCE DAILY WITH MEALS 05/17/17  Yes Tonia Ghent, MD  Multiple Vitamin (MULTIVITAMIN) tablet Take 1 tablet by mouth daily.     Yes [provider]  omeprazole (PRILOSEC) 20 MG capsule Take 1 capsule (20 mg total) by mouth daily. 06/24/15  Yes Minna Merritts, MD  tamsulosin (FLOMAX) 0.4 MG CAPS capsule Take 1 capsule (0.4 mg total) by mouth daily. 10/19/17  Yes Tonia Ghent, MD  vitamin C (ASCORBIC ACID) 500 MG tablet Take 500 mg by mouth daily.   Yes [provider]  warfarin (COUMADIN) 4 MG tablet TAKE AS DIRECTED BY ANTICOAGULATION CLINIC 06/14/17  Yes Tonia Ghent, MD    Family History  Problem Relation Age of Onset  . Cancer Father        lung  . Colon cancer Neg Hx   . Prostate cancer Neg Hx      Social History   Tobacco Use  . Smoking status: Never Smoker  . Smokeless tobacco: Never Used  Substance Use Topics  . Alcohol use: Yes    Comment: socially, beer   . Drug use: No    Allergies as of 01/02/2018 - Review Complete 01/02/2018  Allergen Reaction Noted  . Atorvastatin Other (See Comments) 11/03/2006  . Statins  02/15/2014    Review of Systems:    All systems reviewed and negative except where noted in HPI.   Physical Exam:  Vital signs in last 24 hours: Temp:  [97.6 F (36.4 C)-98.3 F (36.8 C)] 98.3 F (36.8 C) (04/02 1456) Pulse Rate:  [67-74] 67 (04/02 1456) Resp:  [16-18] 16 (04/02 1456) BP: (127-157)/(78-91) 127/88 (04/02 1456) SpO2:  [96 %-100 %] 99 % (04/02 1456) Last BM Date: 01/03/18 General:   Pleasant, cooperative in NAD Head:  Normocephalic and atraumatic. Eyes:   No icterus.   Conjunctiva pink. PERRLA. Ears:  Normal auditory acuity. Neck:  Supple; no masses or thyroidomegaly Lungs: Respirations even and unlabored. Lungs clear to auscultation bilaterally.   No wheezes, crackles, or rhonchi.  Heart:  Regular rate and rhythm;  Without murmur, clicks, rubs or  gallops Abdomen:  Soft, nondistended, nontender. Normal bowel sounds. No appreciable masses or hepatomegaly.  No rebound or guarding.  Rectal:  Not performed. Msk:  Symmetrical without gross deformities.    Extremities:  Without edema, cyanosis or clubbing. Neurologic:  Alert and oriented x3;  grossly normal neurologically. Skin:  Intact without significant lesions or rashes. Cervical Nodes:  No significant cervical adenopathy. Psych:  Alert and cooperative. Normal affect.  LAB RESULTS: Recent Labs    01/02/18  1110 01/02/18 2219 01/03/18 0526  WBC 3.5*  --  3.5*  HGB 11.4* 11.2* 11.0*  HCT 35.2*  --  33.7*  PLT 216  --  187   BMET Recent Labs    01/02/18 1110 01/03/18 0526  NA 126* 126*  K 4.2 4.1  CL 94* 95*  CO2 25 26  GLUCOSE 110* 91  BUN 12 9  CREATININE 0.72 0.65  CALCIUM 8.5* 8.0*   LFT Recent Labs    01/02/18 1110  PROT 6.7  ALBUMIN 3.6  AST 31  ALT 24  ALKPHOS 62  BILITOT 0.8   PT/INR Recent Labs    01/02/18 1110  LABPROT 21.8*  INR 1.92    STUDIES: No results found.    Impression / Plan:   YOSSI HINCHMAN is a 84 y.o. y/o male with with melena and heme positive stools and a history of being on Coumadin.  The patient has had his Coumadin held and will be set up for an EGD for tomorrow.  The patient states that he is on his Coumadin for a history of valvular repair of the mitral valve in the 1990s and he has undergone cardioversion multiple times.  The patient will need to be put back on anticoagulation and therefore doing the upper endoscopy sooner than later would be recommended. I have discussed risks & benefits which include, but are not limited to, bleeding, infection, perforation & drug reaction.  The patient agrees with this plan & written consent will be obtained.     Thank you for involving me in the care of this patient.      LOS: 0 days   Jeremy Lame, MD  01/03/2018, 7:13 PM   Note: This dictation was prepared with Dragon  dictation along with smaller phrase technology. Any transcriptional errors that result from this process are unintentional.

## 2018-01-03 NOTE — Progress Notes (Signed)
[place] [facility] [place] NAME: [name]    MR#:  [id number]  DATE OF BIRTH:  1933/04/01  SUBJECTIVE:    REVIEW OF SYSTEMS:   ROS Tolerating Diet: Tolerating PT:   DRUG ALLERGIES:   Allergies  Allergen Reactions  . Atorvastatin Other (See Comments)    Other reaction(s): Unknown REACTION:muscular soreness  . Statins     Muscle aches on mult statins    VITALS:  Blood pressure (!) 143/91, pulse 71, temperature 98.1 F (36.7 C), temperature source Oral, resp. rate 18, height 5\' 11"  (1.803 m), weight 81.6 kg (180 lb), SpO2 96 %.  PHYSICAL EXAMINATION:   Physical Exam  GENERAL:  84 y.o.-year-old patient lying in the bed with no acute distress.  EYES: Pupils equal, round, reactive to light and accommodation. No scleral icterus. Extraocular muscles intact.  HEENT: Head atraumatic, normocephalic. Oropharynx and nasopharynx clear.  NECK:  Supple, no jugular venous distention. No thyroid enlargement, no tenderness.  LUNGS: Normal breath sounds bilaterally, no wheezing, rales, rhonchi. No use of accessory muscles of respiration.  CARDIOVASCULAR: S1, S2 normal. No murmurs, rubs, or gallops.  ABDOMEN: Soft, nontender, nondistended. Bowel sounds present. No organomegaly or mass.  EXTREMITIES: No cyanosis, clubbing or edema b/l.    NEUROLOGIC: Cranial nerves II through XII are intact. No focal Motor or sensory deficits b/l.   PSYCHIATRIC:  patient is alert and oriented x 3.  SKIN: No obvious rash, lesion, or ulcer.   LABORATORY PANEL:  CBC Recent Labs  Lab 01/03/18 0526  WBC 3.5*  HGB 11.0*  HCT 33.7*  PLT 187    Chemistries  Recent Labs  Lab 01/02/18 1110 01/03/18 0526  NA 126* 126*  K 4.2 4.1  CL 94* 95*  CO2 25 26  GLUCOSE 110* 91  BUN 12 9  CREATININE 0.72 0.65  CALCIUM 8.5* 8.0*  AST 31  --   ALT 24  --   ALKPHOS 62  --   BILITOT 0.8  --    Cardiac Enzymes No results for input(s): TROPONINI in the  last 168 hours. RADIOLOGY:  No results found. ASSESSMENT AND PLAN:   Ala Capri  is a 84 y.o. male with a known history of atrial fibrillation, CHF, coronary artery disease, esophageal reflux, hypertension, iron deficiency anemia- takes warfarin at home. For last 4-5 days noted dark stool. Denies any associated complain of abdominal pain, nausea, vomiting. stool was much darker today, concerned with this he came to  Emergency room.   * Melena/acute GI bleed   Guaiac positive stool as  Per ER physician   Hemoglobin stable for now.   Hold warfarin and anticoagulation.   Follow hemoglobin, GI consult placed   Protonix twice a day. -Patient has had several upper GI endoscopies done in 2009 and 2010.  Records are not available however reviewing [facility] GI clinic reviews patient had polypectomy severe bleeding in his stomach in 2009.  He has history of peptic ulcer disease also.  * hypertension   Continue all home medications.  * coronary artery disease   Continue metoprolol   He has not on statin,we may need to start before discharge.  * atrial fibrillation   Continue amiodarone, metoprolol.   Hold warfarin.  * hyponatremia   IV fluid, recheck tomorrow.   Case discussed with Care Management/Social Worker. Management plans discussed with the patient, family and they are in agreement.  CODE STATUS: Full  DVT Prophylaxis: SCD  TOTAL TIME  TAKING CARE OF THIS PATIENT: 30 minutes.  >50% time spent on counselling and coordination of care  POSSIBLE D/C IN 1=2 DAYS, DEPENDING ON CLINICAL CONDITION.  Note: This dictation was prepared with Dragon dictation along with smaller phrase technology. Any transcriptional errors that result from this process are unintentional.  Fritzi Mandes M.D on 01/03/2018 at 1:34 PM  Between 7am to 6pm - Pager - [phone]  After 6pm go to [email or web address] - password [email or web address] [facility] [place]  Office  [phone]  CC: Primary care  physician; Tonia Ghent, MDPatient ID: [name], male   DOB: December 19, 1932, 84 y.o.   MRN: [id number]

## 2018-01-03 NOTE — Care Management Obs Status (Signed)
[place] NOTIFICATION   Patient Details  Name: CALIN FANTROY MRN: [id number] Date of Birth: 1933-06-23   Medicare Observation Status Notification Given:  Yes. Permission given to sign given per Mr. Christa See, RN 01/03/2018, 8:14 AM

## 2018-01-03 NOTE — Care Management Note (Signed)
Case Management Note  Patient Details  Name: [name] MRN: [id number] Date of Birth: July 16, 1933  Subjective/Objective:  Admitted to [facility] under observation status with the diagnosis of GI bleed. Lives with wife, Olin Hauser [phone]). Last seen Dr. Damita Dunnings 12/13/17.  Prescriptions are filled at [facility] in [place]. No home Health. No  Skilled Nursing. No home oxygen. No medical equipment in the home. Takes care of all basic activities of daily living himself, can drive. Last fall was 2-3 weeks ago. Decreased appetite. Lost 10 pounds in the last few weeks. Family will transport                  Action/Plan: No follow-up needs identified at this time.   Expected Discharge Date:                  Expected Discharge Plan:     In-House Referral:     Discharge planning Services     Post Acute Care Choice:    Choice offered to:     DME Arranged:    DME Agency:     HH Arranged:    HH Agency:     Status of Service:     If discussed at [employer], dates discussed:    Additional Comments:  Shelbie Ammons, RN MSN CCM Care Management [phone] 01/03/2018, 8:17 AM

## 2018-01-04 ENCOUNTER — Observation Stay: Payer: Medicare Other | Admitting: Anesthesiology

## 2018-01-04 ENCOUNTER — Encounter
Admission: EM | Disposition: A | Discharge status: Home / Self Care | Payer: Self-pay | Source: Home / Self Care | Attending: Emergency Medicine

## 2018-01-04 ENCOUNTER — Encounter: Payer: Self-pay | Admitting: *Deleted

## 2018-01-04 DIAGNOSIS — K922 Gastrointestinal hemorrhage, unspecified: Secondary | ICD-10-CM | POA: Diagnosis not present

## 2018-01-04 DIAGNOSIS — I10 Essential (primary) hypertension: Secondary | ICD-10-CM | POA: Diagnosis not present

## 2018-01-04 DIAGNOSIS — I4891 Unspecified atrial fibrillation: Secondary | ICD-10-CM | POA: Diagnosis not present

## 2018-01-04 DIAGNOSIS — K31819 Angiodysplasia of stomach and duodenum without bleeding: Secondary | ICD-10-CM | POA: Diagnosis not present

## 2018-01-04 DIAGNOSIS — E871 Hypo-osmolality and hyponatremia: Secondary | ICD-10-CM | POA: Diagnosis not present

## 2018-01-04 HISTORY — PX: ESOPHAGOGASTRODUODENOSCOPY (EGD) WITH PROPOFOL: SHX5813

## 2018-01-04 LAB — HEMOGLOBIN: Hemoglobin: 10.8 g/dL — ABNORMAL LOW (ref 13.0–18.0)

## 2018-01-04 SURGERY — ESOPHAGOGASTRODUODENOSCOPY (EGD) WITH PROPOFOL
Anesthesia: General

## 2018-01-04 MED ORDER — WARFARIN - PHARMACIST DOSING INPATIENT
Freq: Every day | Status: DC
  Administered 2018-01-04: 18:00:00 2

## 2018-01-04 MED ORDER — PROPOFOL 500 MG/50ML IV EMUL
INTRAVENOUS | Status: AC
  Filled 2018-01-04: qty 50

## 2018-01-04 MED ORDER — LIDOCAINE HCL (CARDIAC) 20 MG/ML IV SOLN
INTRAVENOUS | Status: DC | PRN
  Administered 2018-01-04: 50 mg via INTRAVENOUS

## 2018-01-04 MED ORDER — PROPOFOL 500 MG/50ML IV EMUL
INTRAVENOUS | Status: DC | PRN
  Administered 2018-01-04: 100 ug/kg/min via INTRAVENOUS

## 2018-01-04 MED ORDER — LIDOCAINE HCL (PF) 2 % IJ SOLN
INTRAMUSCULAR | Status: AC
  Filled 2018-01-04: qty 10

## 2018-01-04 MED ORDER — WARFARIN SODIUM 2 MG PO TABS
2.0000 mg | ORAL_TABLET | Freq: Once | ORAL | Status: AC
  Administered 2018-01-04: 2 mg via ORAL
  Filled 2018-01-04: qty 1

## 2018-01-04 MED ORDER — PREMIER PROTEIN SHAKE: 11.0000 [oz_av] | Freq: Two times a day (BID) | ORAL | Status: DC

## 2018-01-04 MED ORDER — PROPOFOL 10 MG/ML IV BOLUS
INTRAVENOUS | Status: DC | PRN
  Administered 2018-01-04: 40 mg via INTRAVENOUS

## 2018-01-04 NOTE — Progress Notes (Signed)
Initial Nutrition Assessment  DOCUMENTATION CODES:   Not applicable  INTERVENTION:   Premier Protein BID, each supplement provides 160 kcal and 30 grams of protein.   MVI daily   NUTRITION DIAGNOSIS:   Inadequate oral intake related to acute illness as evidenced by NPO status.  GOAL:   Patient will meet greater than or equal to 90% of their needs  MONITOR:   Diet advancement, Labs, Weight trends, Skin, I & O's  REASON FOR ASSESSMENT:   Malnutrition Screening Tool    ASSESSMENT:   84 y.o. male with a known history of atrial fibrillation, CHF, coronary artery disease, billroth II, esophageal reflux, hypertension, iron deficiency anemia- takes warfarin at home admitted with GIB  Met with pt in room today. Pt reports good appetite and oral intake pta. Pt reports that he is hungry today and would like to eat. Pt currently NPO s/p EGD today; pt noted to have a single bleeding angiodysplastic lesion in the stomach. Pt also with h/o billroth II secondary to gastric ulcer perforation. Pt reports that he takes a daily MVI, B12, and vitamin D but does not drink any supplements. Per chart, pt is weight stable. RD will order supplements when diet advanced; pt would like to have Premier Protein.     Medications reviewed and include: vitamin D, ferrous sulfate, MVI, omega 3, protonix, aldactone, B12, NaCl @75ml /hr, colace  Labs reviewed: N 126(L), Cl 95(L), Ca 8.0(L)- 4/2 Hgb 10.8(L)  NUTRITION - FOCUSED PHYSICAL EXAM:    Most Recent Value  Orbital Region  No depletion  Upper Arm Region  Moderate depletion  Thoracic and Lumbar Region  Mild depletion  Buccal Region  No depletion  Temple Region  No depletion  Clavicle Bone Region  No depletion  Clavicle and Acromion Bone Region  No depletion  Scapular Bone Region  No depletion  Dorsal Hand  Moderate depletion  Patellar Region  Mild depletion  Anterior Thigh Region  Mild depletion  Posterior Calf Region  No depletion  Edema (RD  Assessment)  None  Hair  Reviewed  Eyes  Reviewed  Mouth  Reviewed  Skin  Reviewed  Nails  Reviewed      Diet Order:  Diet NPO time specified  EDUCATION NEEDS:   Education needs have been addressed  Skin:  Reviewed RN Assessment  Last BM:  4/3- type 7  Height:   Ht Readings from Last 1 Encounters:  01/02/18 5' 11"  (1.803 m)    Weight:   Wt Readings from Last 1 Encounters:  01/02/18 180 lb (81.6 kg)    Ideal Body Weight:  78.2 kg  BMI:  Body mass index is 25.1 kg/m.  Estimated Nutritional Needs:   Kcal:  1800-2100kcal/day  Protein:  82-90g/day   Fluid:  >1.9L/day   Koleen Distance MS, RD, LDN Pager #[phone] After Hours Pager: [phone]

## 2018-01-04 NOTE — Anesthesia Postprocedure Evaluation (Signed)
Anesthesia Post Note  Patient: CAROLINE MATTERS  Procedure(s) Performed: ESOPHAGOGASTRODUODENOSCOPY (EGD) WITH PROPOFOL (N/A )  Patient location during evaluation: Endoscopy Anesthesia Type: General Level of consciousness: awake and alert Pain management: pain level controlled Vital Signs Assessment: post-procedure vital signs reviewed and stable Respiratory status: spontaneous breathing, nonlabored ventilation, respiratory function stable and patient connected to nasal cannula oxygen Cardiovascular status: blood pressure returned to baseline and stable Postop Assessment: no apparent nausea or vomiting Anesthetic complications: no     Last Vitals:  Vitals:   01/04/18 1320 01/04/18 1330  BP: 131/74 134/89  Pulse: 64 65  Resp: 17 14  Temp:    SpO2: 100% 98%    Last Pain:  Vitals:   01/04/18 1330  TempSrc:   PainSc: 0-No pain                 Adali Pennings S

## 2018-01-04 NOTE — Anesthesia Procedure Notes (Signed)
Date/Time: 01/04/2018 12:52 PM Performed by: Johnna Acosta, CRNA Pre-anesthesia Checklist: Patient identified, Emergency Drugs available, Suction available, Patient being monitored and Timeout performed Patient Re-evaluated:Patient Re-evaluated prior to induction Oxygen Delivery Method: Nasal cannula Preoxygenation: Pre-oxygenation with 100% oxygen

## 2018-01-04 NOTE — Progress Notes (Signed)
ANTICOAGULATION CONSULT NOTE - Initial Consult  Pharmacy Consult for warfarin Indication: atrial fibrillation  Allergies  Allergen Reactions  . Atorvastatin Other (See Comments)    Other reaction(s): Unknown REACTION:muscular soreness  . Statins     Muscle aches on mult statins    Patient Measurements: Height: 5\' 11"  (180.3 cm) Weight: 180 lb (81.6 kg) IBW/kg (Calculated) : 75.3  Vital Signs: Temp: 97.4 F (36.3 C) (04/03 1311) Temp Source: Tympanic (04/03 1311) BP: 136/85 (04/03 1402) Pulse Rate: 68 (04/03 1402)  Labs: Recent Labs    01/02/18 1110  01/03/18 0526 01/04/18 0550  HGB 11.4*   < > 11.0* 10.8*  HCT 35.2*  --  33.7*  --   PLT 216  --  187  --   LABPROT 21.8*  --   --   --   INR 1.92  --   --   --   CREATININE 0.72  --  0.65  --    < > = values in this interval not displayed.    Estimated Creatinine Clearance: 75.8 mL/min (by C-G formula based on SCr of 0.65 mg/dL).  Assessment: Pharmacy consulted to dose and monitor warfarin in this 84 year old male. Warfarin has been held on admission for melena. Patient underwent EGD today and per MD it is ok to resume home dose of warfarin this evening.  Goal of Therapy:  INR 2-3 Monitor platelets by anticoagulation protocol: Yes   Plan: Warfarin 2 mg PO this evening. Recheck INR with AM labs tomorrow.  Darylene Price [place] 01/04/2018,2:49 PM

## 2018-01-04 NOTE — Anesthesia Post-op Follow-up Note (Signed)
Anesthesia QCDR form completed.        

## 2018-01-04 NOTE — Op Note (Signed)
[facility] Gastroenterology Patient Name: [name] Procedure Date: 01/04/2018 12:27 PM MRN: [id number] Account #: [id number] Date of Birth: [birth year] Admit Type: Inpatient Age: 84 Room: [facility] ENDO ROOM 3 Gender: Male Note Status: Finalized Procedure:            Upper GI endoscopy Indications:          Melena Providers:            [name] MD, MD Referring MD:         Elveria Rising. Damita Dunnings, MD (Referring MD) Medicines:            Propofol per Anesthesia Complications:        No immediate complications. Procedure:            Pre-Anesthesia Assessment:                       - Prior to the procedure, a History and Physical was                        performed, and patient medications and allergies were                        reviewed. The patient's tolerance of previous                        anesthesia was also reviewed. The risks and benefits of                        the procedure and the sedation options and risks were                        discussed with the patient. All questions were                        answered, and informed consent was obtained. Prior                        Anticoagulants: The patient has taken no previous                        anticoagulant or antiplatelet agents. ASA Grade                        Assessment: II - A patient with mild systemic disease.                        After reviewing the risks and benefits, the patient was                        deemed in satisfactory condition to undergo the                        procedure.                       After obtaining informed consent, the endoscope was                        passed under direct vision. Throughout the procedure,  the patient's blood pressure, pulse, and oxygen                        saturations were monitored continuously. The Endoscope                        was introduced through the mouth, and advanced to the   jejunum. The upper GI endoscopy was accomplished                        without difficulty. The patient tolerated the procedure                        well. Findings:      The examined esophagus was normal.      Evidence of a patent Billroth II gastrojejunostomy was found. The       gastrojejunal anastomosis was characterized by healthy appearing mucosa.       This was traversed. The efferent limb was examined. The afferent limb       was examined.      A single 3 mm bleeding angiodysplastic lesion was found in the gastric       body. Coagulation for hemostasis using argon plasma at 2 liters/minute       and 30 watts was successful.      The examined jejunum was normal. Impression:           - Normal esophagus.                       - Patent Billroth II gastrojejunostomy was found,                        characterized by healthy appearing mucosa.                       - A single bleeding angiodysplastic lesion in the                        stomach. Treated with argon plasma coagulation (APC).                       - Normal examined jejunum.                       - No specimens collected. Recommendation:       - Return patient to hospital ward for ongoing care.                       - Resume previous diet. Procedure Code(s):    --- Professional ---                       [phone], Esophagogastroduodenoscopy, flexible, transoral;                        with control of bleeding, any method Diagnosis Code(s):    --- Professional ---                       K92.1, Melena (includes Hematochezia)                       K31.811, Angiodysplasia of stomach and duodenum  with                        bleeding CPT copyright 2017 American Medical Association. All rights reserved. The codes documented in this report are preliminary and upon coder review may  be revised to meet current compliance requirements. [name] MD, MD 01/04/2018 1:07:40 PM This report has been signed electronically. Number of  Addenda: 0 Note Initiated On: 01/04/2018 12:27 PM      [facility]

## 2018-01-04 NOTE — Transfer of Care (Signed)
Immediate Anesthesia Transfer of Care Note  Patient: [name]  Procedure(s) Performed: ESOPHAGOGASTRODUODENOSCOPY (EGD) WITH PROPOFOL (N/A )  Patient Location: PACU  Anesthesia Type:General  Level of Consciousness: sedated  Airway & Oxygen Therapy: Patient Spontanous Breathing and Patient connected to nasal cannula oxygen  Post-op Assessment: Report given to RN and Post -op Vital signs reviewed and stable  Post vital signs: Reviewed and stable  Last Vitals:  Vitals Value Taken Time  BP 112/53 01/04/2018  1:11 PM  Temp 36.3 C 01/04/2018  1:11 PM  Pulse 66 01/04/2018  1:11 PM  Resp 11 01/04/2018  1:11 PM  SpO2 95 % 01/04/2018  1:11 PM    Last Pain:  Vitals:   01/04/18 1311  TempSrc: Tympanic  PainSc:          Complications: No apparent anesthesia complications

## 2018-01-04 NOTE — Anesthesia Preprocedure Evaluation (Addendum)
Anesthesia Evaluation  Patient identified by MRN, date of birth, ID band Patient awake    Reviewed: Allergy & Precautions, NPO status , Patient's Chart, lab work & pertinent test results, reviewed documented beta blocker date and time   Airway Mallampati: III  TM Distance: >3 FB     Dental  (+) Chipped, Upper Dentures, Partial Lower   Pulmonary sleep apnea ,           Cardiovascular hypertension, Pt. on medications and Pt. on home beta blockers + CAD and +CHF  + dysrhythmias Atrial Fibrillation      Neuro/Psych  Neuromuscular disease    GI/Hepatic GERD  ,  Endo/Other    Renal/GU      Musculoskeletal   Abdominal   Peds  Hematology  (+) anemia ,   Anesthesia Other Findings MV replacement. EF 25. R eye blind. Hb 10.8. INR increased.  Reproductive/Obstetrics                            Anesthesia Physical Anesthesia Plan  ASA: III  Anesthesia Plan: General   Post-op Pain Management:    Induction: Intravenous  PONV Risk Score and Plan:   Airway Management Planned:   Additional Equipment:   Intra-op Plan:   Post-operative Plan:   Informed Consent: I have reviewed the patients History and Physical, chart, labs and discussed the procedure including the risks, benefits and alternatives for the proposed anesthesia with the patient or authorized representative who has indicated his/her understanding and acceptance.     Plan Discussed with: CRNA  Anesthesia Plan Comments:         Anesthesia Quick Evaluation

## 2018-01-05 ENCOUNTER — Telehealth: Payer: Self-pay

## 2018-01-05 DIAGNOSIS — K31819 Angiodysplasia of stomach and duodenum without bleeding: Secondary | ICD-10-CM | POA: Diagnosis not present

## 2018-01-05 DIAGNOSIS — I10 Essential (primary) hypertension: Secondary | ICD-10-CM | POA: Diagnosis not present

## 2018-01-05 DIAGNOSIS — K922 Gastrointestinal hemorrhage, unspecified: Secondary | ICD-10-CM | POA: Diagnosis not present

## 2018-01-05 DIAGNOSIS — E871 Hypo-osmolality and hyponatremia: Secondary | ICD-10-CM | POA: Diagnosis not present

## 2018-01-05 LAB — PROTIME-INR
INR: 2.16
Prothrombin Time: 23.9 seconds — ABNORMAL HIGH (ref 11.4–15.2)

## 2018-01-05 MED ORDER — WARFARIN SODIUM 1 MG PO TABS: 1.0000 mg | ORAL_TABLET | Freq: Once | ORAL | 0 refills | Status: DC

## 2018-01-05 MED ORDER — WARFARIN SODIUM 1 MG PO TABS
1.0000 mg | ORAL_TABLET | Freq: Once | ORAL | Status: DC
  Filled 2018-01-05: qty 1

## 2018-01-05 MED ORDER — WARFARIN SODIUM 2 MG PO TABS: 2.0000 mg | ORAL_TABLET | Freq: Once | ORAL | Status: DC

## 2018-01-05 NOTE — Telephone Encounter (Signed)
[name] with [facility] transferred Dr Posey Pronto from [facility] over; Dr Posey Pronto request INR ck on 01/09/18; Leafy Ro RN said she will schedule hospital f/u and INR on 01/09/18 at 2 pm. Dr Posey Pronto voiced understanding. FYI to Dr Damita Dunnings.

## 2018-01-05 NOTE — Discharge Instructions (Signed)
GET your PT/INR checked on Monday April 8th at your PCP's office

## 2018-01-05 NOTE — Progress Notes (Addendum)
[facility] at [employer] NAME: [name]    MR#:  [id number]  DATE OF BIRTH:  05-11-1933  SUBJECTIVE:  No new complaints  REVIEW OF SYSTEMS:   Review of Systems  Constitutional: Negative for chills, fever and weight loss.  HENT: Negative for ear discharge, ear pain and nosebleeds.   Eyes: Negative for blurred vision, pain and discharge.  Respiratory: Negative for sputum production, shortness of breath, wheezing and stridor.   Cardiovascular: Negative for chest pain, palpitations, orthopnea and PND.  Gastrointestinal: Negative for abdominal pain, diarrhea, nausea and vomiting.  Genitourinary: Negative for frequency and urgency.  Musculoskeletal: Negative for back pain and joint pain.  Neurological: Negative for sensory change, speech change, focal weakness and weakness.  Psychiatric/Behavioral: Negative for depression and hallucinations. The patient is not nervous/anxious.    DRUG ALLERGIES:   Allergies  Allergen Reactions  . Atorvastatin Other (See Comments)    Other reaction(s): Unknown REACTION:muscular soreness  . Statins     Muscle aches on mult statins    VITALS:  Blood pressure (!) 156/79, pulse 66, temperature (!) 97.5 F (36.4 C), temperature source Oral, resp. rate 18, height 5\' 11"  (1.803 m), weight 81.6 kg (180 lb), SpO2 96 %.  PHYSICAL EXAMINATION:   Physical Exam  GENERAL:  84 y.o.-year-old patient lying in the bed with no acute distress.  EYES: Pupils equal, round, reactive to light and accommodation. No scleral icterus. Extraocular muscles intact.  HEENT: Head atraumatic, normocephalic. Oropharynx and nasopharynx clear.  NECK:  Supple, no jugular venous distention. No thyroid enlargement, no tenderness.  LUNGS: Normal breath sounds bilaterally, no wheezing, rales, rhonchi. No use of accessory muscles of respiration.  CARDIOVASCULAR: S1, S2 normal. No murmurs, rubs, or gallops.  ABDOMEN: Soft,  nontender, nondistended. Bowel sounds present. No organomegaly or mass.  EXTREMITIES: No cyanosis, clubbing or edema b/l.    NEUROLOGIC: Cranial nerves II through XII are intact. No focal Motor or sensory deficits b/l.   PSYCHIATRIC:  patient is alert and oriented x 3.  SKIN: No obvious rash, lesion, or ulcer.   LABORATORY PANEL:  CBC Recent Labs  Lab 01/03/18 0526 01/04/18 0550  WBC 3.5*  --   HGB 11.0* 10.8*  HCT 33.7*  --   PLT 187  --     Chemistries  Recent Labs  Lab 01/02/18 1110 01/03/18 0526  NA 126* 126*  K 4.2 4.1  CL 94* 95*  CO2 25 26  GLUCOSE 110* 91  BUN 12 9  CREATININE 0.72 0.65  CALCIUM 8.5* 8.0*  AST 31  --   ALT 24  --   ALKPHOS 62  --   BILITOT 0.8  --    Cardiac Enzymes No results for input(s): TROPONINI in the last 168 hours. RADIOLOGY:  No results found. ASSESSMENT AND PLAN:   [name], CHF, coronary artery disease, esophageal reflux, hypertension, iron deficiency anemia- takes warfarin at home. For last 4-5 days noted dark stool. Denies any associated complain of abdominal pain, nausea, vomiting. stool was much darker today, concerned with this he came to Emergency room.   * Melena/acute GI bleed Guaiac positive stool as Per ER physician Hemoglobin stable for now. Hold warfarin and anticoagulation. Follow hemoglobin, GI consult appreciated. EGD today Protonix twice a day. -Patient has had several upper GI endoscopies done in 2009 and 2010.  Records are not available however reviewing [facility] GI clinic reviews patient had polypectomy  severe bleeding in his stomach in 2009.  He has history of peptic ulcer disease also.  * hypertension Continue all home medications.  * coronary artery disease Continue metoprolol He has not on statin,we may need to start before discharge.  * atrial fibrillation Continue amiodarone, metoprolol. Hold  warfarin.  * hyponatremia IV fluid, recheck tomorrow.   Case discussed with Care Management/Social Worker. Management plans discussed with the patient, family and they are in agreement.  CODE STATUS: full  DVT Prophylaxis:SCD TOTAL TIME TAKING CARE OF THIS PATIENT: *25* minutes.  >50% time spent on counselling and coordination of care  POSSIBLE D/C IN 1 DAYS, DEPENDING ON CLINICAL CONDITION.  Note: This dictation was prepared with Dragon dictation along with smaller phrase technology. Any transcriptional errors that result from this process are unintentional.  Fritzi Mandes M.D  at 8:35 AM  Between 7am to 6pm - Pager - [phone]  After 6pm go to [email or web address] - password [email or web address] [facility]  Office  [phone]  CC: Primary care physician; Tonia Ghent, MDPatient ID: Ronalee Belts, male   DOB: May 15, 1933, 84 y.o.   MRN: [id number]

## 2018-01-05 NOTE — Discharge Summary (Signed)
[facility] at [place] NAME: [name]    MR#:  [id number]  DATE OF BIRTH:  01-Nov-1932  DATE OF ADMISSION:  01/02/2018 ADMITTING PHYSICIAN: Vaughan Basta, MD  DATE OF DISCHARGE: 01/05/2018  PRIMARY CARE PHYSICIAN: Tonia Ghent, MD    ADMISSION DIAGNOSIS:  Melena [K92.1] Hyponatremia [E87.1] Anticoagulated [Z79.01]  DISCHARGE DIAGNOSIS:  Melena due to Gastric angiodysplasia s/p argon laser therapy Chronic anticoagulation with coumadin for Atrial fibrillation  SECONDARY DIAGNOSIS:   Past Medical History:  Diagnosis Date  . Atrial fibrillation ([place])   . Blind right eye   . CHF (congestive heart failure) ([place])   . Coronary atherosclerosis of unspecified type of vessel, native or graft 2008   cardioversion. Echo mod LVH EF 25%, Triv AR  Mild LAE, RAE 11/15/*11  . Diaphragmatic hernia without mention of obstruction or gangrene   . Diverticulosis of colon (without mention of hemorrhage)   . Dizziness and giddiness   . Encounter for long-term (current) use of other medications   . Encounter for therapeutic drug monitoring   . Esophageal reflux   . Gastritis 12/18/08   EGD, 3 clips remaining (Dr. Vira Agar)  . Hypertension   . Iron deficiency anemia, unspecified   . Left heart failure ([place])   . Long term (current) use of anticoagulants   . Macular degeneration   . Personal history of other diseases of digestive system   . Personal history of peptic ulcer disease   . Pure hypercholesterolemia   . Sprain of neck   . Unspecified hemorrhoids without mention of complication   . Unspecified sleep apnea   . Upper GI bleed 7/15-7/20/09   Hosp.  Elevated INR, coumadin stopped sinusitis    HOSPITAL COURSE:  [name], CHF, coronary artery disease, esophageal reflux, hypertension, iron deficiency anemia- takes warfarin at home. For last 4-5 days noted  dark stool. Denies any associated complain of abdominal pain, nausea, vomiting. stool was much darker today, concerned with this he came to Emergency room.   *Melena/acute GI bleed Guaiac positive stool as Per ER physician Hemoglobin stable for now. Hold warfarin and anticoagulation.  GI consultappreciated. EGD Normal esophagus.    - Patent Billroth II gastrojejunostomy was found,   characterized by healthy appearing mucosa.    - A single bleeding angiodysplastic lesion in thestomach. Treated with argon plasma coagulation (APC).     - Normal examined jejunum.      - No specimens collected. -cont Protonix twice a day. -Patient has had several upper GI endoscopies done in 2009 and 2010. Records are not available however reviewing [facility] GI clinic reviews patient had polypectomy severe bleeding in his stomach in 2009. He has history of peptic ulcer disease also.  * hypertension Continue all home medications.  * coronary artery disease Continue metoprolol  * atrial fibrillation Continue amiodarone, metoprolol. resumed warfarin at 1 mg. INR 2.16 today at discharge. Pt will f/u Dr Damita Dunnings for INR check. (spoke with staff at there office)  * hyponatremia received IV fluid. Pt asymptomatic  hgb stable at 10.8 D/c home  CONSULTS OBTAINED:  Treatment Team:  Virgel Manifold, MD  DRUG ALLERGIES:   Allergies  Allergen Reactions  . Atorvastatin Other (See Comments)    Other reaction(s): Unknown REACTION:muscular soreness  . Statins     Muscle aches on mult statins    DISCHARGE MEDICATIONS:   Allergies as of 01/05/2018  Reactions   Atorvastatin Other (See Comments)   Other reaction(s): Unknown REACTION:muscular soreness   Statins    Muscle aches on mult statins      Medication List    TAKE these medications   amiodarone 200 MG tablet Commonly known as:  PACERONE TAKE 1 TABLET BY MOUTH ONCE DAILY   cholecalciferol 1000 units  tablet Commonly known as:  VITAMIN D Take 1,000 Units by mouth daily.   cyanocobalamin 100 MCG tablet Take 100 mcg by mouth daily.   enalapril 5 MG tablet Commonly known as:  VASOTEC TAKE ONE TABLET BY MOUTH ONCE DAILY   eplerenone 25 MG tablet Commonly known as:  INSPRA TAKE 1 TABLET BY MOUTH ONCE DAILY   ferrous sulfate 325 (65 FE) MG tablet Take 325 mg by mouth 2 (two) times daily.   finasteride 5 MG tablet Commonly known as:  PROSCAR Take 1 tablet (5 mg total) by mouth daily.   fish oil-omega-3 fatty acids 1000 MG capsule Take 1 g by mouth daily.   furosemide 40 MG tablet Commonly known as:  LASIX Take 1 tablet (40 mg total) by mouth daily as needed.   hydroxypropyl methylcellulose / hypromellose 2.5 % ophthalmic solution Commonly known as:  ISOPTO TEARS / GONIOVISC Place 1 drop into both eyes every morning.   metoprolol succinate 50 MG 24 hr tablet Commonly known as:  TOPROL-XL TAKE ONE TABLET BY MOUTH ONCE DAILY WITH MEALS   multivitamin tablet Take 1 tablet by mouth daily.   omeprazole 20 MG capsule Commonly known as:  PRILOSEC Take 1 capsule (20 mg total) by mouth daily.   tamsulosin 0.4 MG Caps capsule Commonly known as:  FLOMAX Take 1 capsule (0.4 mg total) by mouth daily.   vitamin C 500 MG tablet Commonly known as:  ASCORBIC ACID Take 500 mg by mouth daily.   warfarin 1 MG tablet Commonly known as:  COUMADIN Take as directed. If you are unsure how to take this medication, talk to your nurse or doctor. Original instructions:  Take 1 tablet (1 mg total) by mouth one time only at 6 PM. What changed:    medication strength  See the new instructions.       If you experience worsening of your admission symptoms, develop shortness of breath, life threatening emergency, suicidal or homicidal thoughts you must seek medical attention immediately by calling 911 or calling your MD immediately  if symptoms less severe.  You Must read complete  instructions/literature along with all the possible adverse reactions/side effects for all the Medicines you take and that have been prescribed to you. Take any new Medicines after you have completely understood and accept all the possible adverse reactions/side effects.   Please note  You were cared for by a hospitalist during your hospital stay. If you have any questions about your discharge medications or the care you received while you were in the hospital after you are discharged, you can call the unit and asked to speak with the hospitalist on call if the hospitalist that took care of you is not available. Once you are discharged, your primary care physician will handle any further medical issues. Please note that NO REFILLS for any discharge medications will be authorized once you are discharged, as it is imperative that you return to your primary care physician (or establish a relationship with a primary care physician if you do not have one) for your aftercare needs so that they can reassess your need for medications and  monitor your lab values. Today   SUBJECTIVE   Doing well VITAL SIGNS:  Blood pressure (!) 156/79, pulse 66, temperature (!) 97.5 F (36.4 C), temperature source Oral, resp. rate 18, height 5\' 11"  (1.803 m), weight 81.6 kg (180 lb), SpO2 96 %.  I/O:    Intake/Output Summary (Last 24 hours) at 01/05/2018 1004 Last data filed at 01/04/2018 1305 Gross per 24 hour  Intake 100 ml  Output -  Net 100 ml    PHYSICAL EXAMINATION:  GENERAL:  84 y.o.-year-old patient lying in the bed with no acute distress.  EYES: Pupils equal, round, reactive to light and accommodation. No scleral icterus. Extraocular muscles intact.  HEENT: Head atraumatic, normocephalic. Oropharynx and nasopharynx clear.  NECK:  Supple, no jugular venous distention. No thyroid enlargement, no tenderness.  LUNGS: Normal breath sounds bilaterally, no wheezing, rales,rhonchi or crepitation. No use of accessory  muscles of respiration.  CARDIOVASCULAR: S1, S2 normal. No murmurs, rubs, or gallops.  ABDOMEN: Soft, non-tender, non-distended. Bowel sounds present. No organomegaly or mass.  EXTREMITIES: No pedal edema, cyanosis, or clubbing.  NEUROLOGIC: Cranial nerves II through XII are intact. Muscle strength 5/5 in all extremities. Sensation intact. Gait not checked.  PSYCHIATRIC: The patient is alert and oriented x 3.  SKIN: No obvious rash, lesion, or ulcer.   DATA REVIEW:   CBC  Recent Labs  Lab 01/03/18 0526 01/04/18 0550  WBC 3.5*  --   HGB 11.0* 10.8*  HCT 33.7*  --   PLT 187  --     Chemistries  Recent Labs  Lab 01/02/18 1110 01/03/18 0526  NA 126* 126*  K 4.2 4.1  CL 94* 95*  CO2 25 26  GLUCOSE 110* 91  BUN 12 9  CREATININE 0.72 0.65  CALCIUM 8.5* 8.0*  AST 31  --   ALT 24  --   ALKPHOS 62  --   BILITOT 0.8  --     Microbiology Results   No results found for this or any previous visit (from the past 240 hour(s)).  RADIOLOGY:  No results found.   Management plans discussed with the patient, family and they are in agreement.  CODE STATUS:     Code Status Orders  (From admission, onward)        Start     Ordered   01/02/18 1732  Full code  Continuous     01/02/18 1732    Code Status History    This patient has a current code status but no historical code status.    Advance Directive Documentation     Most Recent Value  Type of Advance Directive  Healthcare Power of Attorney, Living will  Pre-existing out of facility DNR order (yellow form or pink MOST form)  -  "MOST" Form in Place?  -      TOTAL TIME TAKING CARE OF THIS PATIENT:40 minutes.    Fritzi Mandes M.D on 01/05/2018 at 10:04 AM  Between 7am to 6pm - Pager - [phone] After 6pm go to [email or web address] - password [email or web address] [facility]  Office  [phone]  CC: Primary care physician; Tonia Ghent, MD

## 2018-01-05 NOTE — Progress Notes (Signed)
Discharge instructions given and went over with patient at bedside. Follow-up appointment reviewed. All questions answered. Patient to discharge home with son. Madlyn Frankel, RN

## 2018-01-06 ENCOUNTER — Other Ambulatory Visit: Payer: Self-pay | Admitting: Cardiovascular Disease

## 2018-01-06 ENCOUNTER — Encounter: Payer: Self-pay | Admitting: Gastroenterology

## 2018-01-06 NOTE — Telephone Encounter (Signed)
Noted. Thanks.

## 2018-01-07 ENCOUNTER — Inpatient Hospital Stay
Admission: EM | Admit: 2018-01-07 | Discharge: 2018-01-10 | DRG: 378 | Disposition: A | Discharge status: Home Health | Payer: Medicare Other | Attending: Specialist | Admitting: Specialist

## 2018-01-07 ENCOUNTER — Other Ambulatory Visit: Payer: Self-pay

## 2018-01-07 ENCOUNTER — Encounter: Payer: Self-pay | Admitting: Emergency Medicine

## 2018-01-07 DIAGNOSIS — K5521 Angiodysplasia of colon with hemorrhage: Principal | ICD-10-CM | POA: Diagnosis present

## 2018-01-07 DIAGNOSIS — I2510 Atherosclerotic heart disease of native coronary artery without angina pectoris: Secondary | ICD-10-CM | POA: Diagnosis present

## 2018-01-07 DIAGNOSIS — G4730 Sleep apnea, unspecified: Secondary | ICD-10-CM | POA: Diagnosis present

## 2018-01-07 DIAGNOSIS — D509 Iron deficiency anemia, unspecified: Secondary | ICD-10-CM | POA: Diagnosis present

## 2018-01-07 DIAGNOSIS — Z79899 Other long term (current) drug therapy: Secondary | ICD-10-CM | POA: Diagnosis not present

## 2018-01-07 DIAGNOSIS — H5461 Unqualified visual loss, right eye, normal vision left eye: Secondary | ICD-10-CM | POA: Diagnosis present

## 2018-01-07 DIAGNOSIS — I110 Hypertensive heart disease with heart failure: Secondary | ICD-10-CM | POA: Diagnosis present

## 2018-01-07 DIAGNOSIS — E7800 Pure hypercholesterolemia, unspecified: Secondary | ICD-10-CM | POA: Diagnosis present

## 2018-01-07 DIAGNOSIS — E871 Hypo-osmolality and hyponatremia: Secondary | ICD-10-CM | POA: Diagnosis present

## 2018-01-07 DIAGNOSIS — I482 Chronic atrial fibrillation: Secondary | ICD-10-CM | POA: Diagnosis present

## 2018-01-07 DIAGNOSIS — Z7901 Long term (current) use of anticoagulants: Secondary | ICD-10-CM | POA: Diagnosis not present

## 2018-01-07 DIAGNOSIS — G4733 Obstructive sleep apnea (adult) (pediatric): Secondary | ICD-10-CM | POA: Diagnosis present

## 2018-01-07 DIAGNOSIS — K921 Melena: Secondary | ICD-10-CM | POA: Diagnosis present

## 2018-01-07 DIAGNOSIS — N400 Enlarged prostate without lower urinary tract symptoms: Secondary | ICD-10-CM | POA: Diagnosis present

## 2018-01-07 DIAGNOSIS — K219 Gastro-esophageal reflux disease without esophagitis: Secondary | ICD-10-CM | POA: Diagnosis present

## 2018-01-07 DIAGNOSIS — Z903 Acquired absence of stomach [part of]: Secondary | ICD-10-CM | POA: Diagnosis not present

## 2018-01-07 DIAGNOSIS — K922 Gastrointestinal hemorrhage, unspecified: Secondary | ICD-10-CM

## 2018-01-07 DIAGNOSIS — I5022 Chronic systolic (congestive) heart failure: Secondary | ICD-10-CM | POA: Diagnosis present

## 2018-01-07 LAB — COMPREHENSIVE METABOLIC PANEL
ALK PHOS: 67 U/L (ref 38–126)
ALT: 24 U/L (ref 17–63)
AST: 37 U/L (ref 15–41)
Albumin: 3.4 g/dL — ABNORMAL LOW (ref 3.5–5.0)
Anion gap: 10 (ref 5–15)
BILIRUBIN TOTAL: 1.3 mg/dL — AB (ref 0.3–1.2)
BUN: 7 mg/dL (ref 6–20)
CALCIUM: 8.1 mg/dL — AB (ref 8.9–10.3)
CO2: 24 mmol/L (ref 22–32)
CREATININE: 0.59 mg/dL — AB (ref 0.61–1.24)
Chloride: 87 mmol/L — ABNORMAL LOW (ref 101–111)
GFR calc Af Amer: >60 mL/min (ref >60)
Glucose, Bld: 107 mg/dL — ABNORMAL HIGH (ref 65–99)
Potassium: 3.7 mmol/L (ref 3.5–5.1)
Sodium: 121 mmol/L — ABNORMAL LOW (ref 135–145)
TOTAL PROTEIN: 6.4 g/dL — AB (ref 6.5–8.1)

## 2018-01-07 LAB — BRAIN NATRIURETIC PEPTIDE: B NATRIURETIC PEPTIDE 5: 622 pg/mL — AB (ref 0.0–100.0)

## 2018-01-07 LAB — CBC WITH DIFFERENTIAL/PLATELET
BASOS ABS: 0 10*3/uL (ref 0–0.1)
Basophils Relative: 1 %
Eosinophils Absolute: 0 10*3/uL (ref 0–0.7)
Eosinophils Relative: 0 %
HEMATOCRIT: 36.3 % — AB (ref 40.0–52.0)
Hemoglobin: 12 g/dL — ABNORMAL LOW (ref 13.0–18.0)
LYMPHS ABS: 0.6 10*3/uL — AB (ref 1.0–3.6)
LYMPHS PCT: 16 %
MCH: 26.6 pg (ref 26.0–34.0)
MCHC: 33.1 g/dL (ref 32.0–36.0)
MCV: 80.5 fL (ref 80.0–100.0)
Monocytes Absolute: 0.4 10*3/uL (ref 0.2–1.0)
Monocytes Relative: 12 %
NEUTROS ABS: 2.6 10*3/uL (ref 1.4–6.5)
Neutrophils Relative %: 71 %
Platelets: 220 10*3/uL (ref 150–440)
RBC: 4.51 MIL/uL (ref 4.40–5.90)
RDW: 16.5 % — ABNORMAL HIGH (ref 11.5–14.5)
WBC: 3.7 10*3/uL — AB (ref 3.8–10.6)

## 2018-01-07 LAB — APTT: aPTT: 34 seconds (ref 24–36)

## 2018-01-07 LAB — TYPE AND SCREEN
ABO/RH(D): O POS
ANTIBODY SCREEN: NEGATIVE

## 2018-01-07 LAB — PROTIME-INR
INR: 1.69
PROTHROMBIN TIME: 19.7 s — AB (ref 11.4–15.2)

## 2018-01-07 LAB — OSMOLALITY: Osmolality: 249 mOsm/kg — CL (ref 275–295)

## 2018-01-07 MED ORDER — OMEGA-3-ACID ETHYL ESTERS 1 G PO CAPS
1.0000 g | ORAL_CAPSULE | Freq: Every day | ORAL | Status: DC
  Administered 2018-01-07 – 2018-01-08 (×2): 1 g via ORAL
  Filled 2018-01-07 (×7): qty 1

## 2018-01-07 MED ORDER — PANTOPRAZOLE SODIUM 40 MG PO TBEC
40.0000 mg | DELAYED_RELEASE_TABLET | Freq: Every day | ORAL | Status: DC
  Administered 2018-01-07 – 2018-01-10 (×4): 40 mg via ORAL
  Filled 2018-01-07 (×4): qty 1

## 2018-01-07 MED ORDER — POLYETHYLENE GLYCOL 3350 17 G PO PACK: 17.0000 g | PACK | Freq: Every day | ORAL | Status: DC | PRN

## 2018-01-07 MED ORDER — ACETAMINOPHEN 325 MG PO TABS
650.0000 mg | ORAL_TABLET | Freq: Four times a day (QID) | ORAL | Status: DC | PRN
  Administered 2018-01-08: 650 mg via ORAL
  Filled 2018-01-07: qty 2

## 2018-01-07 MED ORDER — ALUM & MAG HYDROXIDE-SIMETH 200-200-20 MG/5ML PO SUSP
30.0000 mL | Freq: Four times a day (QID) | ORAL | Status: DC | PRN
  Administered 2018-01-07: 30 mL via ORAL
  Filled 2018-01-07: qty 30

## 2018-01-07 MED ORDER — VITAMIN C 500 MG PO TABS
500.0000 mg | ORAL_TABLET | Freq: Every day | ORAL | Status: DC
  Administered 2018-01-07 – 2018-01-08 (×2): 500 mg via ORAL
  Filled 2018-01-07 (×5): qty 1

## 2018-01-07 MED ORDER — SODIUM CHLORIDE 0.9 % IV SOLN
Freq: Once | INTRAVENOUS | Status: AC
  Administered 2018-01-07: 17:00:00 via INTRAVENOUS

## 2018-01-07 MED ORDER — WARFARIN - PHARMACIST DOSING INPATIENT
Freq: Every day | Status: DC
  Filled 2018-01-07 (×5): qty 1

## 2018-01-07 MED ORDER — ONDANSETRON HCL 4 MG/2ML IJ SOLN
4.0000 mg | Freq: Four times a day (QID) | INTRAMUSCULAR | Status: DC | PRN
  Administered 2018-01-07 – 2018-01-08 (×3): 4 mg via INTRAVENOUS
  Filled 2018-01-07 (×3): qty 2

## 2018-01-07 MED ORDER — METOPROLOL SUCCINATE ER 50 MG PO TB24
50.0000 mg | ORAL_TABLET | Freq: Every day | ORAL | Status: DC
  Administered 2018-01-07 – 2018-01-10 (×4): 50 mg via ORAL
  Filled 2018-01-07 (×4): qty 1

## 2018-01-07 MED ORDER — VITAMIN D 1000 UNITS PO TABS
1000.0000 [IU] | ORAL_TABLET | Freq: Every day | ORAL | Status: DC
  Administered 2018-01-07 – 2018-01-09 (×3): 1000 [IU] via ORAL
  Filled 2018-01-07 (×3): qty 1

## 2018-01-07 MED ORDER — PANTOPRAZOLE SODIUM 40 MG IV SOLR
40.0000 mg | Freq: Once | INTRAVENOUS | Status: AC
  Administered 2018-01-07: 40 mg via INTRAVENOUS
  Filled 2018-01-07: qty 40

## 2018-01-07 MED ORDER — ADULT MULTIVITAMIN W/MINERALS CH
1.0000 | ORAL_TABLET | Freq: Every day | ORAL | Status: DC
  Administered 2018-01-07 – 2018-01-08 (×2): 1 via ORAL
  Filled 2018-01-07 (×5): qty 1

## 2018-01-07 MED ORDER — SODIUM CHLORIDE 0.9 % IV SOLN
INTRAVENOUS | Status: DC
  Administered 2018-01-08: 01:00:00 via INTRAVENOUS

## 2018-01-07 MED ORDER — FINASTERIDE 5 MG PO TABS
5.0000 mg | ORAL_TABLET | Freq: Every day | ORAL | Status: DC
  Administered 2018-01-07 – 2018-01-10 (×4): 5 mg via ORAL
  Filled 2018-01-07 (×4): qty 1

## 2018-01-07 MED ORDER — AMIODARONE HCL 200 MG PO TABS
200.0000 mg | ORAL_TABLET | Freq: Every day | ORAL | Status: DC
  Administered 2018-01-07 – 2018-01-10 (×4): 200 mg via ORAL
  Filled 2018-01-07 (×4): qty 1

## 2018-01-07 MED ORDER — VITAMIN B-12 100 MCG PO TABS
100.0000 ug | ORAL_TABLET | Freq: Every day | ORAL | Status: DC
  Administered 2018-01-07 – 2018-01-08 (×2): 100 ug via ORAL
  Filled 2018-01-07 (×4): qty 1

## 2018-01-07 MED ORDER — POLYVINYL ALCOHOL 1.4 % OP SOLN
1.0000 [drp] | Freq: Every morning | OPHTHALMIC | Status: DC
  Administered 2018-01-08: 1 [drp] via OPHTHALMIC
  Filled 2018-01-07: qty 15

## 2018-01-07 MED ORDER — ACETAMINOPHEN 650 MG RE SUPP: 650.0000 mg | Freq: Four times a day (QID) | RECTAL | Status: DC | PRN

## 2018-01-07 MED ORDER — WARFARIN SODIUM 1 MG PO TABS: 1.0000 mg | ORAL_TABLET | Freq: Once | ORAL | Status: DC

## 2018-01-07 MED ORDER — TAMSULOSIN HCL 0.4 MG PO CAPS
0.4000 mg | ORAL_CAPSULE | Freq: Every day | ORAL | Status: DC
  Administered 2018-01-07 – 2018-01-10 (×4): 0.4 mg via ORAL
  Filled 2018-01-07 (×4): qty 1

## 2018-01-07 MED ORDER — WARFARIN SODIUM 1 MG PO TABS
1.0000 mg | ORAL_TABLET | Freq: Every day | ORAL | Status: DC
  Administered 2018-01-07 – 2018-01-08 (×2): 1 mg via ORAL
  Filled 2018-01-07 (×2): qty 1

## 2018-01-07 MED ORDER — SPIRONOLACTONE 25 MG PO TABS
25.0000 mg | ORAL_TABLET | Freq: Every day | ORAL | Status: DC
  Administered 2018-01-07 – 2018-01-08 (×2): 25 mg via ORAL
  Filled 2018-01-07 (×2): qty 1

## 2018-01-07 MED ORDER — ONDANSETRON HCL 4 MG PO TABS: 4.0000 mg | ORAL_TABLET | Freq: Four times a day (QID) | ORAL | Status: DC | PRN

## 2018-01-07 MED ORDER — FERROUS SULFATE 325 (65 FE) MG PO TABS
325.0000 mg | ORAL_TABLET | Freq: Two times a day (BID) | ORAL | Status: DC
  Administered 2018-01-08 – 2018-01-10 (×5): 325 mg via ORAL
  Filled 2018-01-07 (×5): qty 1

## 2018-01-07 MED ORDER — ENALAPRIL MALEATE 5 MG PO TABS
5.0000 mg | ORAL_TABLET | Freq: Every day | ORAL | Status: DC
  Administered 2018-01-07 – 2018-01-10 (×4): 5 mg via ORAL
  Filled 2018-01-07 (×4): qty 1

## 2018-01-07 NOTE — Progress Notes (Signed)
[facility] for Warfarin Indication: atrial fibrillation  Allergies  Allergen Reactions  . Atorvastatin Other (See Comments)    Other reaction(s): Unknown REACTION:muscular soreness  . Statins     Muscle aches on mult statins    Patient Measurements: Height: 5\' 11"  (180.3 cm) Weight: 180 lb (81.6 kg) IBW/kg (Calculated) : 75.3  Vital Signs: Temp: 98 F (36.7 C) (04/06 1357) Temp Source: Oral (04/06 1357) BP: 147/99 (04/06 1357) Pulse Rate: 95 (04/06 1357)  Labs: Recent Labs    01/05/18 0711 01/07/18 1409  HGB  --  12.0*  HCT  --  36.3*  PLT  --  220  APTT  --  34  LABPROT 23.9* 19.7*  INR 2.16 1.69  CREATININE  --  0.59*    Estimated Creatinine Clearance: 75.8 mL/min (A) (by C-G formula based on SCr of 0.59 mg/dL (L)).   Medical History: Past Medical History:  Diagnosis Date  . Atrial fibrillation ([place])   . Blind right eye   . CHF (congestive heart failure) ([place])   . Coronary atherosclerosis of unspecified type of vessel, native or graft 2008   cardioversion. Echo mod LVH EF 25%, Triv AR  Mild LAE, RAE 11/15/*11  . Diaphragmatic hernia without mention of obstruction or gangrene   . Diverticulosis of colon (without mention of hemorrhage)   . Dizziness and giddiness   . Encounter for long-term (current) use of other medications   . Encounter for therapeutic drug monitoring   . Esophageal reflux   . Gastritis 12/18/08   EGD, 3 clips remaining (Dr. Vira Agar)  . Hypertension   . Iron deficiency anemia, unspecified   . Left heart failure ([place])   . Long term (current) use of anticoagulants   . Macular degeneration   . Personal history of other diseases of digestive system   . Personal history of peptic ulcer disease   . Pure hypercholesterolemia   . Sprain of neck   . Unspecified hemorrhoids without mention of complication   . Unspecified sleep apnea   . Upper GI bleed 7/15-7/20/09   Hosp.  Elevated INR, coumadin stopped  sinusitis    Assessment: 84 y/o M with a h/o atrial fibrillation on warfarin as well as GIB admitted for dizziness and lightheadedness. Patient recently discharged on warfarin 1 mg daily but may have been taking 0.25 mg daily per med rec.   Goal of Therapy:  INR 2-3   Plan:  Will resume warfarin 1 mg daily and f/u daily INR.   [name] D 01/07/2018,4:31 PM

## 2018-01-07 NOTE — ED Provider Notes (Addendum)
[facility] Emergency Department Provider Note  ____________________________________________   I have reviewed the triage vital signs and the nursing notes. Where available I have reviewed prior notes and, if possible and indicated, outside hospital notes.    HISTORY  Chief Complaint GI Bleeding    HPI [name] is a 84 y.o. male a history of CHF, atrial fibrillation, diverticulitis, significant GI bleeds in the past, 3 days ago had endoscopy which did not show any evidence of acute GI bleed at that time, he notes.  Patient went home and was doing okay at home, has been feeling somewhat weak however and then had one cannot a bowel movement today.  No hematemesis no abdominal pain.  he is on warfarin.  Past Medical History:  Diagnosis Date  . Atrial fibrillation ([place])   . Blind right eye   . CHF (congestive heart failure) ([place])   . Coronary atherosclerosis of unspecified type of vessel, native or graft 2008   cardioversion. Echo mod LVH EF 25%, Triv AR  Mild LAE, RAE 11/15/*11  . Diaphragmatic hernia without mention of obstruction or gangrene   . Diverticulosis of colon (without mention of hemorrhage)   . Dizziness and giddiness   . Encounter for long-term (current) use of other medications   . Encounter for therapeutic drug monitoring   . Esophageal reflux   . Gastritis 12/18/08   EGD, 3 clips remaining (Dr. Vira Agar)  . Hypertension   . Iron deficiency anemia, unspecified   . Left heart failure ([place])   . Long term (current) use of anticoagulants   . Macular degeneration   . Personal history of other diseases of digestive system   . Personal history of peptic ulcer disease   . Pure hypercholesterolemia   . Sprain of neck   . Unspecified hemorrhoids without mention of complication   . Unspecified sleep apnea   . Upper GI bleed 7/15-7/20/09   Hosp.  Elevated INR, coumadin stopped sinusitis    Patient Active Problem List   Diagnosis Date  Noted  . Melena   . GI bleed 01/02/2018  . Long term (current) use of anticoagulants 12/19/2017  . Rhinitis 11/04/2017  . Bilateral carotid bruits 11/17/2015  . H. pylori infection 10/26/2015  . Persistent atrial fibrillation ([place]) 04/28/2015  . Medicare annual wellness visit, subsequent 01/31/2015  . Advance care planning 01/31/2015  . Long term current use of anticoagulant therapy 01/30/2015  . Abnormal TSH 02/16/2014  . Chronic systolic heart failure ([place]) 08/16/2013  . Fatigue 09/08/2012  . Cardiomyopathy, dilated, nonischemic ([place]) 12/19/2011  . Hypertension 01/20/2011  . ROSACEA 11/12/2009  . BPH with obstruction/lower urinary tract symptoms 09/03/2009  . MITRAL VALVE REPLACEMENT, HX OF 06/11/2009  . OTHER AND UNSPECIFIED MITRAL VALVE DISEASES 03/04/2009  . PEPTIC ULCER DISEASE, HX OF 02/27/2009  . ANEMIA, IRON DEFICIENCY 04/09/2008  . GERD 04/09/2008  . HYPERCHOLESTEROLEMIA 04/08/2008  . LEFT VENTRICULAR FAILURE 04/08/2008  . HEMORRHOIDS 04/08/2008  . HIATAL HERNIA 04/08/2008  . DIVERTICULOSIS, COLON 04/08/2008  . SLEEP APNEA 04/08/2008  . GASTROINTESTINAL HEMORRHAGE, HX OF 04/08/2008  . Atrial fibrillation ([place]) 02/01/2007    Past Surgical History:  Procedure Laterality Date  . APPENDECTOMY    . CARDIAC CATHETERIZATION  1996   [facility]  . CARDIAC CATHETERIZATION    . CARDIAC CATHETERIZATION    . CARDIOVERSION  12/17/2011   Procedure: CARDIOVERSION;  Surgeon: Hillary Bow, MD;  Location: [place];  Service: Cardiovascular;  Laterality: N/A;  . CATARACT EXTRACTION  10/13/08   left  . CHOLECYSTECTOMY    . ESOPHAGOGASTRODUODENOSCOPY (EGD) WITH PROPOFOL N/A 01/04/2018   Procedure: ESOPHAGOGASTRODUODENOSCOPY (EGD) WITH PROPOFOL;  Surgeon: [name], MD;  Location: [facility] ENDOSCOPY;  Service: Endoscopy;  Laterality: N/A;  . MITRAL VALVE ANNULOPLASTY  1996  . MITRAL VALVE REPAIR  1995  . PARTIAL GASTRECTOMY      Prior to Admission medications   Medication Sig Start Date  End Date Taking? Authorizing Provider  amiodarone (PACERONE) 200 MG tablet TAKE 1 TABLET BY MOUTH ONCE DAILY 11/08/17   Wellington Hampshire, MD  cholecalciferol (VITAMIN D) 1000 UNITS tablet Take 1,000 Units by mouth daily.    [provider]  cyanocobalamin 100 MCG tablet Take 100 mcg by mouth daily.      [provider]  enalapril (VASOTEC) 5 MG tablet TAKE ONE TABLET BY MOUTH ONCE DAILY 11/17/17   Wellington Hampshire, MD  eplerenone (INSPRA) 25 MG tablet TAKE 1 TABLET BY MOUTH ONCE DAILY 01/02/18   Wellington Hampshire, MD  ferrous sulfate 325 (65 FE) MG tablet Take 325 mg by mouth 2 (two) times daily.    [provider]  finasteride (PROSCAR) 5 MG tablet Take 1 tablet (5 mg total) by mouth daily. 11/03/17   Tonia Ghent, MD  fish oil-omega-3 fatty acids 1000 MG capsule Take 1 g by mouth daily.    [provider]  furosemide (LASIX) 40 MG tablet Take 1 tablet (40 mg total) by mouth daily as needed. 03/18/15   Wellington Hampshire, MD  hydroxypropyl methylcellulose (ISOPTO TEARS) 2.5 % ophthalmic solution Place 1 drop into both eyes every morning.    [provider]  metoprolol succinate (TOPROL-XL) 50 MG 24 hr tablet TAKE ONE TABLET BY MOUTH ONCE DAILY WITH MEALS 05/17/17   Tonia Ghent, MD  Multiple Vitamin (MULTIVITAMIN) tablet Take 1 tablet by mouth daily.      [provider]  omeprazole (PRILOSEC) 20 MG capsule Take 1 capsule (20 mg total) by mouth daily. 06/24/15   Minna Merritts, MD  tamsulosin (FLOMAX) 0.4 MG CAPS capsule Take 1 capsule (0.4 mg total) by mouth daily. 10/19/17   Tonia Ghent, MD  vitamin C (ASCORBIC ACID) 500 MG tablet Take 500 mg by mouth daily.    [provider]  warfarin (COUMADIN) 1 MG tablet Take 1 tablet (1 mg total) by mouth one time only at 6 PM. 01/05/18   Fritzi Mandes, MD    Allergies Atorvastatin and Statins  Family History  Problem Relation Age of Onset  . Cancer Father        lung  . Colon cancer  Neg Hx   . Prostate cancer Neg Hx     Social History Social History   Tobacco Use  . Smoking status: Never Smoker  . Smokeless tobacco: Never Used  Substance Use Topics  . Alcohol use: Yes    Comment: socially, beer   . Drug use: No    Review of Systems Constitutional: No fever/chills Eyes: No visual changes. ENT: No sore throat. No stiff neck no neck pain Cardiovascular: Denies chest pain. Respiratory: Denies shortness of breath. Gastrointestinal:   no vomiting.  No diarrhea.  No constipation. Genitourinary: Negative for dysuria. Musculoskeletal: Negative lower extremity swelling Skin: Negative for rash. Neurological: Negative for severe headaches, focal weakness or numbness.   ____________________________________________   PHYSICAL EXAM:  VITAL SIGNS: ED Triage Vitals [01/07/18 1357]  Enc Vitals Group     BP Marland Kitchen)  147/99     Pulse Rate 95     Resp 16     Temp 98 F (36.7 C)     Temp Source Oral     SpO2 98 %     Weight 180 lb (81.6 kg)     Height 5\' 11"  (1.803 m)     Head Circumference      Peak Flow      Pain Score 0     Pain Loc      Pain Edu?      Excl. in [place]?     Constitutional: Alert and oriented. Well appearing and in no acute distress. Eyes: Conjunctivae are normal Head: Atraumatic HEENT: No congestion/rhinnorhea. Mucous membranes are moist.  Oropharynx non-erythematous Neck:   Nontender with no meningismus, no masses, no stridor Cardiovascular: Normal rate, regular rhythm. Grossly normal heart sounds.  Good peripheral circulation. Respiratory: Normal respiratory effort.  No retractions. Lungs CTAB. Abdominal: Soft and nontender. No distention. No guarding no rebound Back:  There is no focal tenderness or step off.  there is no midline tenderness there are no lesions noted. there is no CVA tenderness Rectal exam: Guaiac positive black stool Musculoskeletal: No lower extremity tenderness, no upper extremity tenderness. No joint effusions, no DVT  signs strong distal pulses no edema Neurologic:  Normal speech and language. No gross focal neurologic deficits are appreciated.  Skin:  Skin is warm, dry and intact. No rash noted. Psychiatric: Mood and affect are normal. Speech and behavior are normal.  ____________________________________________   LABS (all labs ordered are listed, but only abnormal results are displayed)  Labs Reviewed  CBC WITH DIFFERENTIAL/PLATELET - Abnormal; Notable for the following components:      Result Value   WBC 3.7 (*)    Hemoglobin 12.0 (*)    HCT 36.3 (*)    RDW 16.5 (*)    Lymphs Abs 0.6 (*)    All other components within normal limits  COMPREHENSIVE METABOLIC PANEL - Abnormal; Notable for the following components:   Sodium 121 (*)    Chloride 87 (*)    Glucose, Bld 107 (*)    Creatinine, Ser 0.59 (*)    Calcium 8.1 (*)    Total Protein 6.4 (*)    Albumin 3.4 (*)    Total Bilirubin 1.3 (*)    All other components within normal limits  PROTIME-INR - Abnormal; Notable for the following components:   Prothrombin Time 19.7 (*)    All other components within normal limits  APTT  TYPE AND SCREEN    Pertinent labs  results that were available during my care of the patient were reviewed by me and considered in my medical decision making (see chart for details). ____________________________________________  EKG  I personally interpreted any EKGs ordered by me or triage  ____________________________________________  RADIOLOGY  Pertinent labs & imaging results that were available during my care of the patient were reviewed by me and considered in my medical decision making (see chart for details). If possible, patient and/or family made aware of any abnormal findings.  No results found. ____________________________________________    PROCEDURES  Procedure(s) performed: None  Procedures  Critical Care performed: None  ____________________________________________   INITIAL  IMPRESSION / ASSESSMENT AND PLAN / ED COURSE  Pertinent labs & imaging results that were available during my care of the patient were reviewed by me and considered in my medical decision making (see chart for details).  Patient has had one melanotic bowel movement vital  signs are reassuring, hemoglobin is reassuring, he also has chronic hyponatremia but today he is much lower than baseline at 121, we will give him ginger IV fluid given his history of CHF to replace his sodium.  He states he has been drinking a lot of water.  States he is also had Gatorade.  We will admit the patient for further observation.  She did not may have had simple bleeding from the procedure itself, there is no evidence of instability at this time we will avoid reversing his Coumadin I think pending further evidence of actual ongoing pathology ____________________________________________   FINAL CLINICAL IMPRESSION(S) / ED DIAGNOSES  Final diagnoses:  None      This chart was dictated using voice recognition software.  Despite best efforts to proofread,  errors can occur which can change meaning.      Schuyler Amor, MD 01/07/18 1517    Schuyler Amor, MD 01/07/18 [phone]

## 2018-01-07 NOTE — ED Triage Notes (Signed)
Arrives from home with c/o gi bleed.  States had a \\procedure  done on Tuesday to stop gi bleeding on Tuesday. Arrives today with one episode of black tarry BM.  Patient states he doesn't feel his best today.  Feels tired, dizzy, and low appetite.

## 2018-01-07 NOTE — Progress Notes (Signed)
Family Meeting Note  Advance Directive: yes  Today a meeting took place with the pt and grand dter\   The following were discussed:Patient's diagnosis: patient came in with generalized weakness lightheadedness is being admitted for acute hyponatremia secondary to poor PO intake. Nephrology consultation placed, Patient's progosis: stable  status address. Patient would like to be full code   Time spent during discussion:16 mins  Fritzi Mandes, MD

## 2018-01-07 NOTE — H&P (Signed)
[facility] [place] NAME: [name]    MR#:  [id number]  DATE OF BIRTH:  1933/07/14  DATE OF ADMISSION:  01/07/2018  PRIMARY CARE PHYSICIAN: Tonia Ghent, MD   REQUESTING/REFERRING PHYSICIAN: Dr. Burlene Arnt  CHIEF COMPLAINT:   Feeling weak dizzy and lightheaded HISTORY OF PRESENT ILLNESS:  [name]  is a 84 y.o. male with a known history of atrial fibrillation on Coumadin, history of G.I. bleed status post endoscopy couple days ago, coronary artery disease comes to the emergency room with the above chief complaint patient was just discharge a few days ago with G.I. 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 Patient's hemoglobin today stable. He has some melena. Came in with generalized weakness poor PO intake and was found to have sodium 121.  Patient is being admitted with acute hyponatremia in the setting of poor PO intake and being on diuretics.  PAST MEDICAL HISTORY:   Past Medical History:  Diagnosis Date  . Atrial fibrillation ([place])   . Blind right eye   . CHF (congestive heart failure) ([place])   . Coronary atherosclerosis of unspecified type of vessel, native or graft 2008   cardioversion. Echo mod LVH EF 25%, Triv AR  Mild LAE, RAE 11/15/*11  . Diaphragmatic hernia without mention of obstruction or gangrene   . Diverticulosis of colon (without mention of hemorrhage)   . Dizziness and giddiness   . Encounter for long-term (current) use of other medications   . Encounter for therapeutic drug monitoring   . Esophageal reflux   . Gastritis 12/18/08   EGD, 3 clips remaining (Dr. Vira Agar)  . Hypertension   . Iron deficiency anemia, unspecified   . Left heart failure ([place])   . Long term (current) use of anticoagulants   . Macular degeneration   . Personal history of other diseases of digestive system   . Personal history of peptic ulcer disease   . Pure  hypercholesterolemia   . Sprain of neck   . Unspecified hemorrhoids without mention of complication   . Unspecified sleep apnea   . Upper GI bleed 7/15-7/20/09   Hosp.  Elevated INR, coumadin stopped sinusitis    PAST SURGICAL HISTOIRY:   Past Surgical History:  Procedure Laterality Date  . APPENDECTOMY    . CARDIAC CATHETERIZATION  1996   [facility]  . CARDIAC CATHETERIZATION    . CARDIAC CATHETERIZATION    . CARDIOVERSION  12/17/2011   Procedure: CARDIOVERSION;  Surgeon: Hillary Bow, MD;  Location: [place];  Service: Cardiovascular;  Laterality: N/A;  . CATARACT EXTRACTION  10/13/08   left  . CHOLECYSTECTOMY    . ESOPHAGOGASTRODUODENOSCOPY (EGD) WITH PROPOFOL N/A 01/04/2018   Procedure: ESOPHAGOGASTRODUODENOSCOPY (EGD) WITH PROPOFOL;  Surgeon: [name], MD;  Location: [facility] ENDOSCOPY;  Service: Endoscopy;  Laterality: N/A;  . MITRAL VALVE ANNULOPLASTY  1996  . MITRAL VALVE REPAIR  1995  . PARTIAL GASTRECTOMY      SOCIAL HISTORY:   Social History   Tobacco Use  . Smoking status: Never Smoker  . Smokeless tobacco: Never Used  Substance Use Topics  . Alcohol use: Yes    Comment: socially, beer     FAMILY HISTORY:   Family History  Problem Relation Age of Onset  . Cancer Father        lung  . Colon cancer Neg Hx   . Prostate cancer Neg Hx     DRUG  ALLERGIES:   Allergies  Allergen Reactions  . Atorvastatin Other (See Comments)    Other reaction(s): Unknown REACTION:muscular soreness  . Statins     Muscle aches on mult statins    REVIEW OF SYSTEMS:  Review of Systems  Constitutional: Negative for chills, fever and weight loss.  HENT: Negative for ear discharge, ear pain and nosebleeds.   Eyes: Negative for blurred vision, pain and discharge.  Respiratory: Negative for sputum production, shortness of breath, wheezing and stridor.   Cardiovascular: Negative for chest pain, palpitations, orthopnea and PND.  Gastrointestinal: Negative for abdominal pain,  diarrhea, nausea and vomiting.  Genitourinary: Negative for frequency and urgency.  Musculoskeletal: Negative for back pain and joint pain.  Neurological: Positive for weakness. Negative for sensory change, speech change and focal weakness.  Psychiatric/Behavioral: Negative for depression and hallucinations. The patient is not nervous/anxious.      MEDICATIONS AT HOME:   Prior to Admission medications   Medication Sig Start Date End Date Taking? Authorizing Provider  amiodarone (PACERONE) 200 MG tablet TAKE 1 TABLET BY MOUTH ONCE DAILY 11/08/17  Yes Wellington Hampshire, MD  cholecalciferol (VITAMIN D) 1000 UNITS tablet Take 1,000 Units by mouth daily.   Yes [provider]  cyanocobalamin 100 MCG tablet Take 100 mcg by mouth daily.     Yes [provider]  enalapril (VASOTEC) 5 MG tablet TAKE ONE TABLET BY MOUTH ONCE DAILY 11/17/17  Yes Wellington Hampshire, MD  eplerenone (INSPRA) 25 MG tablet TAKE 1 TABLET BY MOUTH ONCE DAILY 01/02/18  Yes Wellington Hampshire, MD  ferrous sulfate 325 (65 FE) MG tablet Take 325 mg by mouth 2 (two) times daily.   Yes [provider]  finasteride (PROSCAR) 5 MG tablet Take 1 tablet (5 mg total) by mouth daily. 11/03/17  Yes Tonia Ghent, MD  fish oil-omega-3 fatty acids 1000 MG capsule Take 1 g by mouth daily.   Yes [provider]  hydroxypropyl methylcellulose (ISOPTO TEARS) 2.5 % ophthalmic solution Place 1 drop into both eyes every morning.   Yes [provider]  metoprolol succinate (TOPROL-XL) 50 MG 24 hr tablet TAKE ONE TABLET BY MOUTH ONCE DAILY WITH MEALS 05/17/17  Yes Tonia Ghent, MD  Multiple Vitamin (MULTIVITAMIN) tablet Take 1 tablet by mouth daily.     Yes [provider]  omeprazole (PRILOSEC) 20 MG capsule Take 1 capsule (20 mg total) by mouth daily. 06/24/15  Yes Minna Merritts, MD  tamsulosin (FLOMAX) 0.4 MG CAPS capsule Take 1 capsule (0.4 mg total) by mouth daily. 10/19/17  Yes Tonia Ghent, MD  vitamin C (ASCORBIC ACID) 500 MG tablet Take 500 mg by mouth daily.   Yes [provider]  warfarin (COUMADIN) 1 MG tablet Take 1 tablet (1 mg total) by mouth one time only at 6 PM. Patient taking differently: Take 0.25 mg by mouth one time only at 6 PM.  01/05/18  Yes Fritzi Mandes, MD  furosemide (LASIX) 40 MG tablet Take 1 tablet (40 mg total) by mouth daily as needed. 03/18/15   Wellington Hampshire, MD      VITAL SIGNS:  Blood pressure (!) 147/99, pulse 95, temperature 98 F (36.7 C), temperature source Oral, resp. rate 16, height 5\' 11"  (1.803 m), weight 81.6 kg (180 lb), SpO2 98 %.  PHYSICAL EXAMINATION:  GENERAL:  84 y.o.-year-old patient lying in the bed with no acute distress. weak EYES: Pupils equal, round, reactive to light and accommodation. No  scleral icterus. Extraocular muscles intact.  HEENT: Head atraumatic, normocephalic. Oropharynx and nasopharynx clear.  NECK:  Supple, no jugular venous distention. No thyroid enlargement, no tenderness.  LUNGS: Normal breath sounds bilaterally, no wheezing, rales,rhonchi or crepitation. No use of accessory muscles of respiration.  CARDIOVASCULAR: S1, S2 normal. No murmurs, rubs, or gallops.  ABDOMEN: Soft, nontender, nondistended. Bowel sounds present. No organomegaly or mass.  EXTREMITIES: No pedal edema, cyanosis, or clubbing.  NEUROLOGIC: Cranial nerves II through XII are intact. Muscle strength 5/5 in all extremities. Sensation intact. Gait not checked.  PSYCHIATRIC: The patient is alert and oriented x 3.  SKIN: No obvious rash, lesion, or ulcer.   LABORATORY PANEL:   CBC Recent Labs  Lab 01/07/18 1409  WBC 3.7*  HGB 12.0*  HCT 36.3*  PLT 220   ------------------------------------------------------------------------------------------------------------------  Chemistries  Recent Labs  Lab 01/07/18 1409  NA 121*  K 3.7  CL 87*  CO2 24  GLUCOSE 107*  BUN 7  CREATININE 0.59*  CALCIUM 8.1*  AST 37  ALT 24   ALKPHOS 67  BILITOT 1.3*   ------------------------------------------------------------------------------------------------------------------  Cardiac Enzymes No results for input(s): TROPONINI in the last 168 hours. ------------------------------------------------------------------------------------------------------------------  RADIOLOGY:  No results found.  EKG:    IMPRESSION AND PLAN:   [name]  is a 84 y.o. male with a known history of atrial fibrillation on Coumadin, history of G.I. bleed status post endoscopy couple days ago, coronary artery disease comes to the emergency room with increasing weakness, dizziness and lightheadedness.  1. acute hyponatremia in the setting of poor PO intake and use of diuretics -admit to medical floor -hold Lasix, give IV fluids, monitor sodium, nephrology consultation placed--spoke with Dr Juleen China -check serum and urine osmolality  2. melena with history of recent G.I. bleed status post endoscopy withlaser therapy for angio dysplasia of the gastric antrum. -Hemoglobin is stable at 12.8. -Continue oral Protonix  3. Chronic atrial fibrillation on Coumadin -pt is not actively bleeding. Will continue Coumadin.  4.DVT prophylaxis On coumadin  All the records are reviewed and case discussed with ED provider. Management plans discussed with the patient, family and they are in agreement.  CODE STATUS: full  TOTAL TIME TAKING CARE OF THIS PATIENT: 50 minutes.    Fritzi Mandes M.D on 01/07/2018 at 4:15 PM  Between 7am to 6pm - Pager - [phone]  After 6pm go to [email or web address] - password [email or web address] [facility]  [facility]  Office  [phone]  CC: Primary care physician; Tonia Ghent, MD

## 2018-01-08 ENCOUNTER — Other Ambulatory Visit: Payer: Self-pay

## 2018-01-08 LAB — COMPREHENSIVE METABOLIC PANEL
ALK PHOS: 62 U/L (ref 38–126)
ALT: 24 U/L (ref 17–63)
AST: 35 U/L (ref 15–41)
Albumin: 3.1 g/dL — ABNORMAL LOW (ref 3.5–5.0)
Anion gap: 6 (ref 5–15)
BUN: 7 mg/dL (ref 6–20)
CALCIUM: 7.9 mg/dL — AB (ref 8.9–10.3)
CHLORIDE: 89 mmol/L — AB (ref 101–111)
CO2: 25 mmol/L (ref 22–32)
CREATININE: 0.58 mg/dL — AB (ref 0.61–1.24)
GFR calc Af Amer: >60 mL/min (ref >60)
Glucose, Bld: 102 mg/dL — ABNORMAL HIGH (ref 65–99)
Potassium: 3.7 mmol/L (ref 3.5–5.1)
Sodium: 120 mmol/L — ABNORMAL LOW (ref 135–145)
Total Bilirubin: 1.3 mg/dL — ABNORMAL HIGH (ref 0.3–1.2)
Total Protein: 6 g/dL — ABNORMAL LOW (ref 6.5–8.1)

## 2018-01-08 LAB — URINALYSIS, COMPLETE (UACMP) WITH MICROSCOPIC
BILIRUBIN URINE: NEGATIVE
Bacteria, UA: NONE SEEN
GLUCOSE, UA: NEGATIVE mg/dL
Hgb urine dipstick: NEGATIVE
KETONES UR: NEGATIVE mg/dL
LEUKOCYTES UA: NEGATIVE
NITRITE: NEGATIVE
PH: 5 (ref 5.0–8.0)
Protein, ur: 30 mg/dL — AB
SPECIFIC GRAVITY, URINE: 1.016 (ref 1.005–1.030)
Squamous Epithelial / LPF: NONE SEEN

## 2018-01-08 LAB — SODIUM: SODIUM: 117 mmol/L — AB (ref 135–145)

## 2018-01-08 LAB — OSMOLALITY: OSMOLALITY: 248 mosm/kg — AB (ref 275–295)

## 2018-01-08 LAB — BASIC METABOLIC PANEL
Anion gap: 7 (ref 5–15)
BUN: 8 mg/dL (ref 6–20)
CHLORIDE: 87 mmol/L — AB (ref 101–111)
CO2: 24 mmol/L (ref 22–32)
CREATININE: 0.56 mg/dL — AB (ref 0.61–1.24)
Calcium: 7.8 mg/dL — ABNORMAL LOW (ref 8.9–10.3)
GFR calc Af Amer: >60 mL/min (ref >60)
GFR calc non Af Amer: >60 mL/min (ref >60)
GLUCOSE: 119 mg/dL — AB (ref 65–99)
Potassium: 3.6 mmol/L (ref 3.5–5.1)
SODIUM: 118 mmol/L — AB (ref 135–145)

## 2018-01-08 LAB — PROTIME-INR
INR: 1.71
Prothrombin Time: 19.9 seconds — ABNORMAL HIGH (ref 11.4–15.2)

## 2018-01-08 LAB — OSMOLALITY, URINE: OSMOLALITY UR: 503 mosm/kg (ref 300–900)

## 2018-01-08 MED ORDER — FUROSEMIDE 40 MG PO TABS: 40.0000 mg | ORAL_TABLET | Freq: Every day | ORAL | Status: DC

## 2018-01-08 MED ORDER — TOLVAPTAN 15 MG PO TABS
15.0000 mg | ORAL_TABLET | ORAL | Status: DC
  Administered 2018-01-08 – 2018-01-09 (×2): 15 mg via ORAL
  Filled 2018-01-08 (×3): qty 1

## 2018-01-08 MED ORDER — DIPHENHYDRAMINE HCL 25 MG PO CAPS
25.0000 mg | ORAL_CAPSULE | Freq: Every evening | ORAL | Status: DC | PRN
  Administered 2018-01-08: 25 mg via ORAL
  Filled 2018-01-08: qty 1

## 2018-01-08 NOTE — Consult Note (Signed)
[facility]  CONSULT NOTE    Date: 01/08/2018                  Patient Name:  [name]  MRN: [id number]  DOB: [birth year]  Age / Sex: 84 y.o., male         PCP: Tonia Ghent, MD                 Service Requesting Consult: Dr. Verdell Carmine                 Reason for Consult: Hyponatremia            History of Present Illness: [name] is a 84 y.o. white male with hyponatremia. Patient with sodium of 118. Given IV fluids overnight.   Patient was recently admitted to [facility] from 4/1 to 4/4 for GI bleed. 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 Allen Norris.   Sodium was 126 on discharge. Seems to have chronic hyponatremia that has been attributed to congestive heart failure. Current diuretic regimen of furosemide and spironolactone.   Medications: Outpatient medications: Medications Prior to Admission  Medication Sig Dispense Refill Last Dose  . amiodarone (PACERONE) 200 MG tablet TAKE 1 TABLET BY MOUTH ONCE DAILY 90 tablet 3 01/06/2018 at Unknown time  . cholecalciferol (VITAMIN D) 1000 UNITS tablet Take 1,000 Units by mouth daily.   01/06/2018 at Unknown time  . cyanocobalamin 100 MCG tablet Take 100 mcg by mouth daily.     01/06/2018 at Unknown time  . enalapril (VASOTEC) 5 MG tablet TAKE ONE TABLET BY MOUTH ONCE DAILY 90 tablet 2 01/06/2018 at Unknown time  . eplerenone (INSPRA) 25 MG tablet TAKE 1 TABLET BY MOUTH ONCE DAILY 30 tablet 0 01/06/2018 at Unknown time  . ferrous sulfate 325 (65 FE) MG tablet Take 325 mg by mouth 2 (two) times daily.   01/06/2018 at Unknown time  . finasteride (PROSCAR) 5 MG tablet Take 1 tablet (5 mg total) by mouth daily. 90 tablet 3 01/06/2018 at Unknown time  . fish oil-omega-3 fatty acids 1000 MG capsule Take 1 g by mouth daily.   01/06/2018 at Unknown time  . hydroxypropyl methylcellulose (ISOPTO TEARS) 2.5 % ophthalmic solution Place 1 drop into both eyes every morning.   01/06/2018 at Unknown time  .  metoprolol succinate (TOPROL-XL) 50 MG 24 hr tablet TAKE ONE TABLET BY MOUTH ONCE DAILY WITH MEALS 90 tablet 3 01/06/2018 at Unknown time  . Multiple Vitamin (MULTIVITAMIN) tablet Take 1 tablet by mouth daily.     01/06/2018 at Unknown time  . omeprazole (PRILOSEC) 20 MG capsule Take 1 capsule (20 mg total) by mouth daily. 90 capsule 3 01/06/2018 at Unknown time  . tamsulosin (FLOMAX) 0.4 MG CAPS capsule Take 1 capsule (0.4 mg total) by mouth daily. 90 capsule 3 01/06/2018 at Unknown time  . vitamin C (ASCORBIC ACID) 500 MG tablet Take 500 mg by mouth daily.   01/06/2018 at Unknown time  . warfarin (COUMADIN) 1 MG tablet Take 1 tablet (1 mg total) by mouth one time only at 6 PM. (Patient taking differently: Take 0.25 mg by mouth one time only at 6 PM. ) 30 tablet 0 01/06/2018 at Unknown time  . furosemide (LASIX) 40 MG tablet Take 1 tablet (40 mg total) by mouth daily as needed. 30 tablet 3 PRN at PRN    Current medications: Current Facility-Administered Medications  Medication Dose Route Frequency Provider Last  Rate Last Dose  . acetaminophen (TYLENOL) tablet 650 mg  650 mg Oral Q6H PRN Fritzi Mandes, MD       Or  . acetaminophen (TYLENOL) suppository 650 mg  650 mg Rectal Q6H PRN Fritzi Mandes, MD      . alum & mag hydroxide-simeth (MAALOX/MYLANTA) 200-200-20 MG/5ML suspension 30 mL  30 mL Oral Q6H PRN Fritzi Mandes, MD   30 mL at 01/07/18 2100  . amiodarone (PACERONE) tablet 200 mg  200 mg Oral Daily Fritzi Mandes, MD   200 mg at 01/08/18 1018  . cholecalciferol (VITAMIN D) tablet 1,000 Units  1,000 Units Oral Daily Fritzi Mandes, MD   1,000 Units at 01/08/18 1018  . diphenhydrAMINE (BENADRYL) capsule 25 mg  25 mg Oral QHS PRN Lance Coon, MD   25 mg at 01/08/18 0029  . enalapril (VASOTEC) tablet 5 mg  5 mg Oral Daily Fritzi Mandes, MD   5 mg at 01/08/18 1023  . ferrous sulfate tablet 325 mg  325 mg Oral BID Fritzi Mandes, MD   325 mg at 01/08/18 1018  . finasteride (PROSCAR) tablet 5 mg  5 mg Oral Daily Fritzi Mandes, MD   5 mg at 01/08/18 1018  . metoprolol succinate (TOPROL-XL) 24 hr tablet 50 mg  50 mg Oral Daily Fritzi Mandes, MD   50 mg at 01/08/18 1018  . multivitamin with minerals tablet 1 tablet  1 tablet Oral Daily Fritzi Mandes, MD   1 tablet at 01/08/18 1031  . omega-3 acid ethyl esters (LOVAZA) capsule 1 g  1 g Oral Daily Fritzi Mandes, MD   1 g at 01/08/18 1018  . ondansetron (ZOFRAN) tablet 4 mg  4 mg Oral Q6H PRN Fritzi Mandes, MD       Or  . ondansetron [facility]) injection 4 mg  4 mg Intravenous Q6H PRN Fritzi Mandes, MD   4 mg at 01/08/18 1018  . pantoprazole (PROTONIX) EC tablet 40 mg  40 mg Oral Daily Fritzi Mandes, MD   40 mg at 01/08/18 1018  . polyethylene glycol (MIRALAX / GLYCOLAX) packet 17 g  17 g Oral Daily PRN Fritzi Mandes, MD      . polyvinyl alcohol (LIQUIFILM TEARS) 1.4 % ophthalmic solution 1 drop  1 drop Both Eyes q morning - 10a Fritzi Mandes, MD   1 drop at 01/08/18 1019  . tamsulosin (FLOMAX) capsule 0.4 mg  0.4 mg Oral Daily Fritzi Mandes, MD   0.4 mg at 01/08/18 1018  . tolvaptan (SAMSCA) tablet 15 mg  15 mg Oral Q24H Chaston Bradburn, MD      . vitamin B-12 (CYANOCOBALAMIN) tablet 100 mcg  100 mcg Oral Daily Fritzi Mandes, MD   100 mcg at 01/08/18 1023  . vitamin C (ASCORBIC ACID) tablet 500 mg  500 mg Oral Daily Fritzi Mandes, MD   500 mg at 01/08/18 1031  . warfarin (COUMADIN) tablet 1 mg  1 mg Oral q1800 Fritzi Mandes, MD   1 mg at 01/07/18 1904  . Warfarin - Pharmacist Dosing Inpatient   Does not apply [id number] Fritzi Mandes, MD          Allergies: Allergies  Allergen Reactions  . Atorvastatin Other (See Comments)    Other reaction(s): Unknown REACTION:muscular soreness  . Statins     Muscle aches on mult statins      Past Medical History: Past Medical History:  Diagnosis Date  . Atrial fibrillation ([place])   . Blind right eye   . CHF (  congestive heart failure) ([place])   . Coronary atherosclerosis of unspecified type of vessel, native or graft 2008   cardioversion. Echo mod LVH  EF 25%, Triv AR  Mild LAE, RAE 11/15/*11  . Diaphragmatic hernia without mention of obstruction or gangrene   . Diverticulosis of colon (without mention of hemorrhage)   . Dizziness and giddiness   . Encounter for long-term (current) use of other medications   . Encounter for therapeutic drug monitoring   . Esophageal reflux   . Gastritis 12/18/08   EGD, 3 clips remaining (Dr. Vira Agar)  . Hypertension   . Iron deficiency anemia, unspecified   . Left heart failure ([place])   . Long term (current) use of anticoagulants   . Macular degeneration   . Personal history of other diseases of digestive system   . Personal history of peptic ulcer disease   . Pure hypercholesterolemia   . Sprain of neck   . Unspecified hemorrhoids without mention of complication   . Unspecified sleep apnea   . Upper GI bleed 7/15-7/20/09   Hosp.  Elevated INR, coumadin stopped sinusitis     Past Surgical History: Past Surgical History:  Procedure Laterality Date  . APPENDECTOMY    . CARDIAC CATHETERIZATION  1996   [facility]  . CARDIAC CATHETERIZATION    . CARDIAC CATHETERIZATION    . CARDIOVERSION  12/17/2011   Procedure: CARDIOVERSION;  Surgeon: Hillary Bow, MD;  Location: [place];  Service: Cardiovascular;  Laterality: N/A;  . CATARACT EXTRACTION  10/13/08   left  . CHOLECYSTECTOMY    . ESOPHAGOGASTRODUODENOSCOPY (EGD) WITH PROPOFOL N/A 01/04/2018   Procedure: ESOPHAGOGASTRODUODENOSCOPY (EGD) WITH PROPOFOL;  Surgeon: [name], MD;  Location: [facility] ENDOSCOPY;  Service: Endoscopy;  Laterality: N/A;  . MITRAL VALVE ANNULOPLASTY  1996  . MITRAL VALVE REPAIR  1995  . PARTIAL GASTRECTOMY       Family History: Family History  Problem Relation Age of Onset  . Cancer Father        lung  . Colon cancer Neg Hx   . Prostate cancer Neg Hx      Social History: Social History   Socioeconomic History  . Marital status: Married    Spouse name: Not on file  . Number of children: 3  . Years of education:  Not on file  . Highest education level: Not on file  Occupational History  . 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 Financial resource strain: Not on file  . Food insecurity:    Worry: Not on file    Inability: Not on file  . Transportation needs:    Medical: Not on file    Non-medical: Not on file  Tobacco Use  . Smoking status: Never Smoker  . Smokeless tobacco: Never Used  Substance and Sexual Activity  . Alcohol use: Yes    Comment: socially, beer   . Drug use: No  . Sexual activity: Not on file  Lifestyle  . Physical activity:    Days per week: Not on file    Minutes per session: Not on file  . Stress: Not on file  Relationships  . Social connections:    Talks on phone: Not on file    Gets together: Not on file    Attends religious service: Not on file    Active member of club or organization: Not on file    Attends meetings of clubs or organizations: Not on file  Relationship status: Not on file  . Intimate partner violence:    Fear of current or ex partner: Not on file    Emotionally abused: Not on file    Physically abused: Not on file    Forced sexual activity: Not on file  Other Topics Concern  . Not on file  Social History Narrative   Married 30+ years    3 kids     Review of Systems: Review of Systems  Constitutional: Negative.  Negative for chills, diaphoresis, fever, malaise/fatigue and weight loss.  HENT: Negative.  Negative for congestion, ear discharge, ear pain, hearing loss, nosebleeds, sinus pain, sore throat and tinnitus.   Eyes: Negative.  Negative for blurred vision, double vision, photophobia, pain, discharge and redness.  Respiratory: Negative.  Negative for cough, hemoptysis, sputum production, shortness of breath, wheezing and stridor.   Cardiovascular: Positive for leg swelling. Negative for chest pain, palpitations, orthopnea, claudication and PND.  Gastrointestinal: Positive for nausea. Negative for  abdominal pain, blood in stool, constipation, diarrhea, heartburn, melena and vomiting.  Genitourinary: Negative.  Negative for dysuria, flank pain, frequency, hematuria and urgency.  Musculoskeletal: Negative.  Negative for back pain, falls, joint pain, myalgias and neck pain.  Skin: Negative.  Negative for itching and rash.  Neurological: Negative.  Negative for dizziness, tingling, tremors, sensory change, speech change, focal weakness, seizures, loss of consciousness, weakness and headaches.  Endo/Heme/Allergies: Negative.  Negative for environmental allergies and polydipsia. Does not bruise/bleed easily.  Psychiatric/Behavioral: Negative.  Negative for depression, hallucinations, memory loss, substance abuse and suicidal ideas. The patient is not nervous/anxious and does not have insomnia.     Vital Signs: Blood pressure (!) 141/79, pulse 72, temperature 98.3 F (36.8 C), temperature source Oral, resp. rate 20, height 5\' 11"  (1.803 m), weight 81.6 kg (180 lb), SpO2 98 %.  Weight trends: Filed Weights   01/07/18 1357  Weight: 81.6 kg (180 lb)    Physical Exam: General: NAD,   Head: Normocephalic, atraumatic. Moist oral mucosal membranes  Eyes: Anicteric, PERRL  Neck: Supple, trachea midline  Lungs:  Clear to auscultation  Heart: Regular rate and rhythm  Abdomen:  Soft, nontender,   Extremities:  1+  peripheral edema.  Neurologic: Nonfocal, moving all four extremities  Skin: No lesions         Lab results: Basic Metabolic Panel: Recent Labs  Lab 01/07/18 1409 01/08/18 0913 01/08/18 1101  NA 121* 120* 118*  K 3.7 3.7 3.6  CL 87* 89* 87*  CO2 24 25 24   GLUCOSE 107* 102* 119*  BUN 7 7 8   CREATININE 0.59* 0.58* 0.56*  CALCIUM 8.1* 7.9* 7.8*    Liver Function Tests: Recent Labs  Lab 01/02/18 1110 01/07/18 1409 01/08/18 0913  AST 31 37 35  ALT 24 24 24   ALKPHOS 62 67 62  BILITOT 0.8 1.3* 1.3*  PROT 6.7 6.4* 6.0*  ALBUMIN 3.6 3.4* 3.1*   No results for  input(s): LIPASE, AMYLASE in the last 168 hours. No results for input(s): AMMONIA in the last 168 hours.  CBC: Recent Labs  Lab 01/02/18 1110  01/03/18 0526 01/04/18 0550 01/07/18 1409  WBC 3.5*  --  3.5*  --  3.7*  NEUTROABS  --   --   --   --  2.6  HGB 11.4*   < > 11.0* 10.8* 12.0*  HCT 35.2*  --  33.7*  --  36.3*  MCV 82.4  --  81.5  --  80.5  PLT 216  --  187  --  220   < > = values in this interval not displayed.    Cardiac Enzymes: No results for input(s): CKTOTAL, CKMB, CKMBINDEX, TROPONINI in the last 168 hours.  BNP: Invalid input(s): POCBNP  CBG: No results for input(s): GLUCAP in the last 168 hours.  Microbiology: No results found for this or any previous visit.  Coagulation Studies: Recent Labs    01/07/18 1409 01/08/18 0507  LABPROT 19.7* 19.9*  INR 1.69 1.71    Urinalysis: No results for input(s): COLORURINE, LABSPEC, PHURINE, GLUCOSEU, HGBUR, BILIRUBINUR, KETONESUR, PROTEINUR, UROBILINOGEN, NITRITE, LEUKOCYTESUR in the last 72 hours.  Invalid input(s): APPERANCEUR    Imaging:  No results found.   Assessment & Plan: [name] is a 84 y.o. 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 Hyponatremia: acute hyponatremia on chronic hyponatremia Edema on examination - Discontinue diuretics: furosemide and spironolactone - Discontinue IV fluids.  - Trial of tolvaptan   2. Hypertension with history of systolic congestive heart failure with echocardiogram in 2015 with EF of 20-25%.  Atrial fibrillation on warfarin  3. Anemia with recent GI bleed. On  warfarin. Recent admission for GI bleed with endoscopy withargon laser therapy to gastric AVM by Dr. Allen Norris on 4/2.  - Hemoglobin stable. 12  LOS: 1 Kel Senn 4/7/201912:20 PM

## 2018-01-08 NOTE — Progress Notes (Signed)
[place] for Warfarin Indication: atrial fibrillation  Allergies  Allergen Reactions  . Atorvastatin Other (See Comments)    Other reaction(s): Unknown REACTION:muscular soreness  . Statins     Muscle aches on mult statins    Patient Measurements: Height: 5\' 11"  (180.3 cm) Weight: 180 lb (81.6 kg) IBW/kg (Calculated) : 75.3  Vital Signs: Temp: 98.3 F (36.8 C) (04/07 0524) Temp Source: Oral (04/07 0524) BP: 132/81 (04/07 0524) Pulse Rate: 72 (04/07 0524)  Labs: Recent Labs    01/07/18 1409 01/08/18 0507  HGB 12.0*  --   HCT 36.3*  --   PLT 220  --   APTT 34  --   LABPROT 19.7* 19.9*  INR 1.69 1.71  CREATININE 0.59*  --     Estimated Creatinine Clearance: 75.8 mL/min (A) (by C-G formula based on SCr of 0.59 mg/dL (L)).   Medical History: Past Medical History:  Diagnosis Date  . Atrial fibrillation ([place])   . Blind right eye   . CHF (congestive heart failure) ([place])   . Coronary atherosclerosis of unspecified type of vessel, native or graft 2008   cardioversion. Echo mod LVH EF 25%, Triv AR  Mild LAE, RAE 11/15/*11  . Diaphragmatic hernia without mention of obstruction or gangrene   . Diverticulosis of colon (without mention of hemorrhage)   . Dizziness and giddiness   . Encounter for long-term (current) use of other medications   . Encounter for therapeutic drug monitoring   . Esophageal reflux   . Gastritis 12/18/08   EGD, 3 clips remaining (Dr. Vira Agar)  . Hypertension   . Iron deficiency anemia, unspecified   . Left heart failure ([place])   . Long term (current) use of anticoagulants   . Macular degeneration   . Personal history of other diseases of digestive system   . Personal history of peptic ulcer disease   . Pure hypercholesterolemia   . Sprain of neck   . Unspecified hemorrhoids without mention of complication   . Unspecified sleep apnea   . Upper GI bleed 7/15-7/20/09   Hosp.  Elevated INR, coumadin  stopped sinusitis    Assessment: 84 y/o M with a h/o atrial fibrillation on warfarin as well as GIB s/p endoscopy a few days PTA which pt was in hospital. currently admitted for dizziness and lightheadedness. Patient recently discharged on warfarin 1 mg daily but may have been taking 0.25 mg daily per med rec.   4/6  INR 1.69  Warfarin 1 mg 4/7  INR 1.71  Goal of Therapy:  INR 2-3   Plan:  Will continue warfarin 1 mg daily and f/u daily INR.  Patient on amiodarone 200 mg daily. (PTA medication)  Neco Kling A 01/08/2018,10:15 AM

## 2018-01-08 NOTE — Progress Notes (Signed)
Dr. Margaretmary Eddy notified of decrease in Na+ 117; asked if Nephrology..."was on board?"; confirmed; acknowledged; Barbaraann Faster, RN. 7:44 PM 01/08/2018

## 2018-01-08 NOTE — Progress Notes (Signed)
Lab called with a critical sodium value of 117 during night shift report, Marcella, RN given the information and agreed to call and notify provider for night shift of critical value.

## 2018-01-08 NOTE — Progress Notes (Signed)
[place] [facility] [place] NAME: [name]    MR#:  [id number]  DATE OF BIRTH:  11/08/1932  SUBJECTIVE:   Patient here due to weakness dizziness and noted to be severely hyponatremic.  Sodium level today down to 120.  Patient is complaining of some intermittent nausea and feeling weak.  REVIEW OF SYSTEMS:    Review of Systems  Constitutional: Negative for chills and fever.  HENT: Negative for congestion and tinnitus.   Eyes: Negative for blurred vision and double vision.  Respiratory: Negative for cough, shortness of breath and wheezing.   Cardiovascular: Negative for chest pain, orthopnea and PND.  Gastrointestinal: Positive for nausea. Negative for abdominal pain, diarrhea and vomiting.  Genitourinary: Negative for dysuria and hematuria.  Neurological: Positive for weakness (generalized weakness). Negative for dizziness, sensory change and focal weakness.  All other systems reviewed and are negative.   Nutrition: Soft diet Tolerating Diet: Yes Tolerating PT: Await Eval.    DRUG ALLERGIES:   Allergies  Allergen Reactions  . Atorvastatin Other (See Comments)    Other reaction(s): Unknown REACTION:muscular soreness  . Statins     Muscle aches on mult statins    VITALS:  Blood pressure 136/77, pulse 74, temperature 98.2 F (36.8 C), temperature source Oral, resp. rate 19, height 5\' 11"  (1.803 m), weight 81.6 kg (180 lb), SpO2 97 %.  PHYSICAL EXAMINATION:   Physical Exam  GENERAL:  84 y.o.-year-old patient lying in bed in no acute distress.  EYES: Pupils equal, round, reactive to light and accommodation. No scleral icterus. Extraocular muscles intact.  HEENT: Head atraumatic, normocephalic. Oropharynx and nasopharynx clear.  NECK:  Supple, no jugular venous distention. No thyroid enlargement, no tenderness.  LUNGS: Normal breath sounds bilaterally, no wheezing, rales, rhonchi. No use of accessory muscles of respiration.   CARDIOVASCULAR: S1, S2 normal. No murmurs, rubs, or gallops.  ABDOMEN: Soft, nontender, nondistended. Bowel sounds present. No organomegaly or mass.  EXTREMITIES: No cyanosis, clubbing, +1 edema b/l.   NEUROLOGIC: Cranial nerves II through XII are intact. No focal Motor or sensory deficits b/l.  Globally weak.   PSYCHIATRIC: The patient is alert and oriented x 3.  SKIN: No obvious rash, lesion, or ulcer.    LABORATORY PANEL:   CBC Recent Labs  Lab 01/07/18 1409  WBC 3.7*  HGB 12.0*  HCT 36.3*  PLT 220   ------------------------------------------------------------------------------------------------------------------  Chemistries  Recent Labs  Lab 01/08/18 0913 01/08/18 1101  NA 120* 118*  K 3.7 3.6  CL 89* 87*  CO2 25 24  GLUCOSE 102* 119*  BUN 7 8  CREATININE 0.58* 0.56*  CALCIUM 7.9* 7.8*  AST 35  --   ALT 24  --   ALKPHOS 62  --   BILITOT 1.3*  --    ------------------------------------------------------------------------------------------------------------------  Cardiac Enzymes No results for input(s): TROPONINI in the last 168 hours. ------------------------------------------------------------------------------------------------------------------  RADIOLOGY:  No results found.   ASSESSMENT AND PLAN:   84 year old male with past medical history of hypertension, gastritis, GERD, history of diverticulosis, CHF, atrial fibrillation, recent admission for GI bleed who presents to the hospital due to generalized weakness and dizziness and noted to be hyponatremic.  1.  Acute hyponatremia-etiology unclear presently.  Suspected to be hypovolemic initially but despite getting IV fluids overnight patient sodium level has continued to trend down.  Patient does have a history of CHF.  -Patient does have some peripheral edema, and therefore discussed with nephrology and will give 1 dose of  tolvaptan and follow response. -Follow serum, urine osmolality.  2.  History  of chronic atrial fibrillation- rate controlled.  Continue amiodarone.  Continue Coumadin.  3.  History of recent GI bleed status post anoscopy showing angiodysplastic lesion in the stomach status post argon treatment. -Hemoglobin stable and will continue to monitor.  4.  Essential hypertension-continue Toprol, enalapril.  5.  BPH-continue finasteride, Flomax  6.  GERD-continue Protonix.    All the records are reviewed and case discussed with Care Management/Social Worker. Management plans discussed with the patient, family and they are in agreement.  CODE STATUS: Full code  DVT Prophylaxis: Warfarin  TOTAL TIME TAKING CARE OF THIS PATIENT: 30 minutes.   POSSIBLE D/C IN 1-2 DAYS, DEPENDING ON CLINICAL CONDITION.   Henreitta Leber M.D on 01/08/2018 at 2:49 PM  Between 7am to 6pm - Pager - [phone]  After 6pm go to [email or web address] - Proofreader  [facility]  Office  [phone]  CC: Primary care physician; Tonia Ghent, MD

## 2018-01-09 ENCOUNTER — Ambulatory Visit: Payer: Medicare Other | Admitting: Family Medicine

## 2018-01-09 LAB — PROTIME-INR
INR: 1.49
Prothrombin Time: 17.9 seconds — ABNORMAL HIGH (ref 11.4–15.2)

## 2018-01-09 LAB — BASIC METABOLIC PANEL
ANION GAP: 6 (ref 5–15)
BUN: 6 mg/dL (ref 6–20)
CO2: 26 mmol/L (ref 22–32)
Calcium: 8.2 mg/dL — ABNORMAL LOW (ref 8.9–10.3)
Chloride: 90 mmol/L — ABNORMAL LOW (ref 101–111)
Creatinine, Ser: 0.54 mg/dL — ABNORMAL LOW (ref 0.61–1.24)
GFR calc Af Amer: >60 mL/min (ref >60)
GLUCOSE: 116 mg/dL — AB (ref 65–99)
POTASSIUM: 3.8 mmol/L (ref 3.5–5.1)
Sodium: 122 mmol/L — ABNORMAL LOW (ref 135–145)

## 2018-01-09 LAB — SODIUM: SODIUM: 126 mmol/L — AB (ref 135–145)

## 2018-01-09 MED ORDER — WARFARIN SODIUM 1 MG PO TABS: 1.0000 mg | ORAL_TABLET | ORAL | Status: DC

## 2018-01-09 MED ORDER — WARFARIN SODIUM 2 MG PO TABS
2.0000 mg | ORAL_TABLET | ORAL | Status: DC
  Administered 2018-01-09: 2 mg via ORAL
  Filled 2018-01-09 (×2): qty 1

## 2018-01-09 MED ORDER — PREMIER PROTEIN SHAKE
11.0000 [oz_av] | Freq: Two times a day (BID) | ORAL | Status: DC
  Administered 2018-01-09 – 2018-01-10 (×2): 11 [oz_av] via ORAL

## 2018-01-09 NOTE — Progress Notes (Signed)
[facility] for Warfarin Indication: atrial fibrillation  Allergies  Allergen Reactions  . Atorvastatin Other (See Comments)    Other reaction(s): Unknown REACTION:muscular soreness  . Statins     Muscle aches on mult statins    Patient Measurements: Height: 5\' 11"  (180.3 cm) Weight: 180 lb (81.6 kg) IBW/kg (Calculated) : 75.3  Vital Signs: Temp: 97.7 F (36.5 C) (04/08 0457) Temp Source: Oral (04/08 0457) BP: 125/82 (04/08 0457) Pulse Rate: 71 (04/08 0457)  Labs: Recent Labs    01/07/18 1409 01/08/18 0507 01/08/18 0913 01/08/18 1101 01/09/18 0335  HGB 12.0*  --   --   --   --   HCT 36.3*  --   --   --   --   PLT 220  --   --   --   --   APTT 34  --   --   --   --   LABPROT 19.7* 19.9*  --   --  17.9*  INR 1.69 1.71  --   --  1.49  CREATININE 0.59*  --  0.58* 0.56* 0.54*    Estimated Creatinine Clearance: 75.8 mL/min (A) (by C-G formula based on SCr of 0.54 mg/dL (L)).   Medical History: Past Medical History:  Diagnosis Date  . Atrial fibrillation ([place])   . Blind right eye   . CHF (congestive heart failure) ([place])   . Coronary atherosclerosis of unspecified type of vessel, native or graft 2008   cardioversion. Echo mod LVH EF 25%, Triv AR  Mild LAE, RAE 11/15/*11  . Diaphragmatic hernia without mention of obstruction or gangrene   . Diverticulosis of colon (without mention of hemorrhage)   . Dizziness and giddiness   . Encounter for long-term (current) use of other medications   . Encounter for therapeutic drug monitoring   . Esophageal reflux   . Gastritis 12/18/08   EGD, 3 clips remaining (Dr. Vira Agar)  . Hypertension   . Iron deficiency anemia, unspecified   . Left heart failure ([place])   . Long term (current) use of anticoagulants   . Macular degeneration   . Personal history of other diseases of digestive system   . Personal history of peptic ulcer disease   . Pure hypercholesterolemia   . Sprain of neck   .  Unspecified hemorrhoids without mention of complication   . Unspecified sleep apnea   . Upper GI bleed 7/15-7/20/09   Hosp.  Elevated INR, coumadin stopped sinusitis    Assessment: 84 y/o M with a h/o atrial fibrillation on warfarin as well as GIB s/p endoscopy a few days PTA which pt was in hospital. currently admitted for dizziness and lightheadedness. Patient recently discharged on warfarin 1 mg daily but may have been taking 0.25 mg daily per med rec.   DATE INR DOSE  4/6 1.69 1 mg 4/7 1.71 1 mg 4/8 1.49  Goal of Therapy:  INR 2-3   Plan:  It appears that patients PTA warfarin dose was recently changed OP in an anti-coag visit to 2 mg po daily except for 1 mg po on Sundays. Will resume PTA dosing since 1 mg po daily has resulted in subtherapeutic INR. Will monitor INR per protocol.  Laural Benes, Pharm.D., BCPS Clinical Pharmacist 01/09/2018,7:30 AM

## 2018-01-09 NOTE — Progress Notes (Signed)
[facility], [place] 01/09/18  Subjective:   Patient is doing fair No complaints Continues to have some edema in the legs   Objective:  Vital signs in last 24 hours:  Temp:  [97.7 F (36.5 C)-98.2 F (36.8 C)] 97.7 F (36.5 C) (04/08 0457) Pulse Rate:  [67-74] 71 (04/08 0457) Resp:  [16-19] 16 (04/08 0457) BP: (125-141)/(77-82) 125/82 (04/08 0457) SpO2:  [97 %-99 %] 99 % (04/08 0457)  Weight change:  Filed Weights   01/07/18 1357  Weight: 180 lb (81.6 kg)    Intake/Output:    Intake/Output Summary (Last 24 hours) at 01/09/2018 1017 Last data filed at 01/09/2018 0908 Gross per 24 hour  Intake 170 ml  Output 1901 ml  Net -1731 ml     Physical Exam: General:  No acute distress, sitting up in the bed  HEENT  anicteric, moist oral mucous membranes  Neck  supple  Pulm/lungs  normal breathing effort, mild basilar crackles  CVS/Heart  no rub  Abdomen:   Soft, nontender  Extremities:  2+ pitting edema  Neurologic:  Alert, oriented  Skin:  No acute rashes          Basic Metabolic Panel:  Recent Labs  Lab 01/03/18 0526 01/07/18 1409 01/08/18 0913 01/08/18 1101 01/08/18 1852 01/09/18 0335  NA 126* 121* 120* 118* 117* 122*  K 4.1 3.7 3.7 3.6  --  3.8  CL 95* 87* 89* 87*  --  90*  CO2 26 24 25 24   --  26  GLUCOSE 91 107* 102* 119*  --  116*  BUN 9 7 7 8   --  6  CREATININE 0.65 0.59* 0.58* 0.56*  --  0.54*  CALCIUM 8.0* 8.1* 7.9* 7.8*  --  8.2*     CBC: Recent Labs  Lab 01/02/18 1110 01/02/18 2219 01/03/18 0526 01/04/18 0550 01/07/18 1409  WBC 3.5*  --  3.5*  --  3.7*  NEUTROABS  --   --   --   --  2.6  HGB 11.4* 11.2* 11.0* 10.8* 12.0*  HCT 35.2*  --  33.7*  --  36.3*  MCV 82.4  --  81.5  --  80.5  PLT 216  --  187  --  220     No results found for: HEPBSAG, HEPBSAB, HEPBIGM    Microbiology:  No results found for this or any previous visit (from the past 240 hour(s)).  Coagulation Studies: Recent Labs   01/07/18 1409 01/08/18 0507 01/09/18 0335  LABPROT 19.7* 19.9* 17.9*  INR 1.69 1.71 1.49    Urinalysis: Recent Labs    01/08/18 2015  COLORURINE YELLOW*  LABSPEC 1.016  PHURINE 5.0  GLUCOSEU NEGATIVE  HGBUR NEGATIVE  BILIRUBINUR NEGATIVE  KETONESUR NEGATIVE  PROTEINUR 30*  NITRITE NEGATIVE  LEUKOCYTESUR NEGATIVE      Imaging: No results found.   Medications:    . amiodarone  200 mg Oral Daily  . cholecalciferol  1,000 Units Oral Daily  . enalapril  5 mg Oral Daily  . ferrous sulfate  325 mg Oral BID  . finasteride  5 mg Oral Daily  . metoprolol succinate  50 mg Oral Daily  . multivitamin with minerals  1 tablet Oral Daily  . omega-3 acid ethyl esters  1 g Oral Daily  . pantoprazole  40 mg Oral Daily  . polyvinyl alcohol  1 drop Both Eyes q morning - 10a  . tamsulosin  0.4 mg Oral Daily  . tolvaptan  15 mg Oral Q24H  . cyanocobalamin  100 mcg Oral Daily  . vitamin C  500 mg Oral Daily  . warfarin  2 mg Oral Once per day on Mon Tue Wed Thu Fri Sat   And  . [START ON 01/15/2018] warfarin  1 mg Oral Once per day on Sun  . Warfarin - Pharmacist Dosing Inpatient   Does not apply q1800   acetaminophen **OR** acetaminophen, alum & mag hydroxide-simeth, diphenhydrAMINE, ondansetron **OR** ondansetron (ZOFRAN) IV, polyethylene glycol  Assessment/ Plan:  84 y.o. 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  Hyponatremia Chronic.  With acute worsening. Likely related to volume overload Asked patient to follow fluid restriction Sodium improved with tolvaptan.  We will give another dose today and  assess response  2.  Lower extremity edema Fluid restriction, tolvaptan   LOS: 2 Novalynn Branaman Candiss Norse 4/8/201910:17 AM  [place], [place]  Note: This note was prepared with Dragon dictation. Any transcription errors are unintentional

## 2018-01-09 NOTE — Progress Notes (Signed)
Initial Nutrition Assessment  DOCUMENTATION CODES:   Not applicable  INTERVENTION:   Premier Protein BID, each supplement provides 160 kcal and 30 grams of protein.   MVI daily   NUTRITION DIAGNOSIS:   Increased nutrient needs related to chronic illness(CHF) as evidenced by increased estimated needs from protein.  GOAL:   Patient will meet greater than or equal to 90% of their needs  MONITOR:   Diet advancement, Labs, Weight trends, Skin, I & O's  REASON FOR ASSESSMENT:   Malnutrition Screening Tool    ASSESSMENT:   84 y.o. 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 Pt reports good appetite and oral intake at baseline. Pt eating well at last admit. Pt currently eating 90% of GI soft diet. EGD at last admit; pt noted to have a single bleeding angiodysplastic lesion in the stomach. Pt also with h/o billroth II secondary to gastric ulcer perforation. Pt reports that he takes a daily MVI, B12, and vitamin D but does not drink any supplements. Per chart, pt is weight stable. RD will order supplements and MVI; pt would like to have Premier Protein.     Medications reviewed and include: vitamin D, ferrous sulfate, MVI, omega 3, protonix, B12, vitamin C, warfarin  Labs reviewed: N 122(L), Cl 90(L), Ca 8.2(L), creat 0.54(L) Osmolality 248(L)- 4/7 BNP 622(H)- 4/6 Hgb 12.0(L), Hct 36.3(L)- 4/6  NUTRITION - FOCUSED PHYSICAL EXAM:    Most Recent Value  Orbital Region  No depletion  Upper Arm Region  Moderate depletion  Thoracic and Lumbar Region  Mild depletion  Buccal Region  No depletion  Temple Region  No depletion   Clavicle Bone Region  No depletion  Clavicle and Acromion Bone Region  No depletion  Scapular Bone Region  No depletion  Dorsal Hand  Moderate depletion  Patellar Region  Mild depletion  Anterior Thigh Region  Mild depletion  Posterior Calf Region  No depletion  Edema (RD Assessment)  None  Hair  Reviewed  Eyes  Reviewed  Mouth  Reviewed  Skin  Reviewed  Nails  Reviewed      Diet Order:  DIET SOFT Room service appropriate? Yes; Fluid consistency: Thin; Fluid restriction: 1200 mL Fluid  EDUCATION NEEDS:   Education needs have been addressed  Skin:  Reviewed RN Assessment  Last BM:  4/7  Height:   Ht Readings from Last 1 Encounters:  01/07/18 5\' 11"  (1.803 m)    Weight:   Wt Readings from Last 1 Encounters:  01/07/18 180 lb (81.6 kg)    Ideal Body Weight:  78.2 kg  BMI:  Body mass index is 25.1 kg/m.  Estimated Nutritional Needs:   Kcal:  1800-2100kcal/day   Protein:  82-90g/day   Fluid:  per MD  Koleen Distance MS, RD, LDN Pager #[phone] After Hours Pager: [phone]

## 2018-01-09 NOTE — Progress Notes (Signed)
[facility] [place] NAME: [name]    MR#:  [id number]  DATE OF BIRTH:  1933/09/02  SUBJECTIVE:   Sodium level up to 122 today.  Patient clinically feels better denies any headache, nausea vomiting today.  REVIEW OF SYSTEMS:    Review of Systems  Constitutional: Negative for chills and fever.  HENT: Negative for congestion and tinnitus.   Eyes: Negative for blurred vision and double vision.  Respiratory: Negative for cough, shortness of breath and wheezing.   Cardiovascular: Negative for chest pain, orthopnea and PND.  Gastrointestinal: Positive for nausea. Negative for abdominal pain, diarrhea and vomiting.  Genitourinary: Negative for dysuria and hematuria.  Neurological: Positive for weakness (generalized weakness). Negative for dizziness, sensory change and focal weakness.  All other systems reviewed and are negative.   Nutrition: Soft diet Tolerating Diet: Yes Tolerating PT: Await Eval.    DRUG ALLERGIES:   Allergies  Allergen Reactions  . Atorvastatin Other (See Comments)    Other reaction(s): Unknown REACTION:muscular soreness  . Statins     Muscle aches on mult statins    VITALS:  Blood pressure 121/70, pulse 73, temperature 98.3 F (36.8 C), temperature source Oral, resp. rate 20, height 5\' 11"  (1.803 m), weight 81.6 kg (180 lb), SpO2 93 %.  PHYSICAL EXAMINATION:   Physical Exam  GENERAL:  84 y.o.-year-old patient lying in bed in no acute distress.  EYES: Pupils equal, round, reactive to light and accommodation. No scleral icterus. Extraocular muscles intact.  HEENT: Head atraumatic, normocephalic. Oropharynx and nasopharynx clear.  NECK:  Supple, no jugular venous distention. No thyroid enlargement, no tenderness.  LUNGS: Normal breath sounds bilaterally, no wheezing, rales, rhonchi. No use of accessory muscles of respiration.  CARDIOVASCULAR: S1, S2 normal. No murmurs, rubs, or gallops.  ABDOMEN:  Soft, nontender, nondistended. Bowel sounds present. No organomegaly or mass.  EXTREMITIES: No cyanosis, clubbing, +1 edema b/l.   NEUROLOGIC: Cranial nerves II through XII are intact. No focal Motor or sensory deficits b/l.  Globally weak.   PSYCHIATRIC: The patient is alert and oriented x 3.  SKIN: No obvious rash, lesion, or ulcer.    LABORATORY PANEL:   CBC Recent Labs  Lab 01/07/18 1409  WBC 3.7*  HGB 12.0*  HCT 36.3*  PLT 220   ------------------------------------------------------------------------------------------------------------------  Chemistries  Recent Labs  Lab 01/08/18 0913  01/09/18 0335  NA 120*   < > 122*  K 3.7   < > 3.8  CL 89*   < > 90*  CO2 25   < > 26  GLUCOSE 102*   < > 116*  BUN 7   < > 6  CREATININE 0.58*   < > 0.54*  CALCIUM 7.9*   < > 8.2*  AST 35  --   --   ALT 24  --   --   ALKPHOS 62  --   --   BILITOT 1.3*  --   --    < > = values in this interval not displayed.   ------------------------------------------------------------------------------------------------------------------  Cardiac Enzymes No results for input(s): TROPONINI in the last 168 hours. ------------------------------------------------------------------------------------------------------------------  RADIOLOGY:  No results found.   ASSESSMENT AND PLAN:   84 year old male with past medical history of hypertension, gastritis, GERD, history of diverticulosis, CHF, atrial fibrillation, recent admission for GI bleed who presents to the hospital due to generalized weakness and dizziness and noted to be hyponatremic.  1.  Acute hyponatremia- acute on chronic hyponatremia.  Secondary to volume overload. -Status post 1 dose of [place] and sodium has improved and will continue to monitor.  Appreciate nephrology input.  2.  History of chronic atrial fibrillation- rate controlled.  Continue amiodarone.  Continue Coumadin.  3.  History of recent GI bleed status post anoscopy  showing angiodysplastic lesion in the stomach status post argon treatment. -Hemoglobin stable and will continue to monitor.  4.  Essential hypertension-continue Toprol, enalapril.  5.  BPH-continue finasteride, Flomax  6.  GERD-continue Protonix.  We will get physical therapy evaluation.  Possible discharge in next 1-2 days if sodium continues to improve.   All the records are reviewed and case discussed with Care Management/Social Worker. Management plans discussed with the patient, family and they are in agreement.  CODE STATUS: Full code  DVT Prophylaxis: Warfarin  TOTAL TIME TAKING CARE OF THIS PATIENT: 30 minutes.   POSSIBLE D/C IN 1-2 DAYS, DEPENDING ON CLINICAL CONDITION.   Henreitta Leber M.D on 01/09/2018 at 2:50 PM  Between 7am to 6pm - Pager - [phone]  After 6pm go to [email or web address] - Proofreader  [facility]  Office  [phone]  CC: Primary care physician; Tonia Ghent, MD

## 2018-01-10 LAB — PROTIME-INR
INR: 1.45
PROTHROMBIN TIME: 17.5 s — AB (ref 11.4–15.2)

## 2018-01-10 LAB — SODIUM: SODIUM: 132 mmol/L — AB (ref 135–145)

## 2018-01-10 MED ORDER — TORSEMIDE 20 MG PO TABS: 20.0000 mg | ORAL_TABLET | Freq: Every day | ORAL | 1 refills | Status: DC

## 2018-01-10 NOTE — Progress Notes (Signed)
[facility] for Warfarin Indication: atrial fibrillation  Allergies  Allergen Reactions  . Atorvastatin Other (See Comments)    Other reaction(s): Unknown REACTION:muscular soreness  . Statins     Muscle aches on mult statins    Patient Measurements: Height: 5\' 11"  (180.3 cm) Weight: 180 lb (81.6 kg) IBW/kg (Calculated) : 75.3  Vital Signs: Temp: 98 F (36.7 C) (04/09 0435) Temp Source: Oral (04/09 0435) BP: 145/72 (04/09 0435) Pulse Rate: 70 (04/09 0435)  Labs: Recent Labs    01/07/18 1409 01/08/18 0507 01/08/18 0913 01/08/18 1101 01/09/18 0335 01/10/18 0526  HGB 12.0*  --   --   --   --   --   HCT 36.3*  --   --   --   --   --   PLT 220  --   --   --   --   --   APTT 34  --   --   --   --   --   LABPROT 19.7* 19.9*  --   --  17.9* 17.5*  INR 1.69 1.71  --   --  1.49 1.45  CREATININE 0.59*  --  0.58* 0.56* 0.54*  --     Estimated Creatinine Clearance: 75.8 mL/min (A) (by C-G formula based on SCr of 0.54 mg/dL (L)).   Medical History: Past Medical History:  Diagnosis Date  . Atrial fibrillation ([place])   . Blind right eye   . CHF (congestive heart failure) ([place])   . Coronary atherosclerosis of unspecified type of vessel, native or graft 2008   cardioversion. Echo mod LVH EF 25%, Triv AR  Mild LAE, RAE 11/15/*11  . Diaphragmatic hernia without mention of obstruction or gangrene   . Diverticulosis of colon (without mention of hemorrhage)   . Dizziness and giddiness   . Encounter for long-term (current) use of other medications   . Encounter for therapeutic drug monitoring   . Esophageal reflux   . Gastritis 12/18/08   EGD, 3 clips remaining (Dr. Vira Agar)  . Hypertension   . Iron deficiency anemia, unspecified   . Left heart failure ([place])   . Long term (current) use of anticoagulants   . Macular degeneration   . Personal history of other diseases of digestive system   . Personal history of peptic ulcer disease   . Pure  hypercholesterolemia   . Sprain of neck   . Unspecified hemorrhoids without mention of complication   . Unspecified sleep apnea   . Upper GI bleed 7/15-7/20/09   Hosp.  Elevated INR, coumadin stopped sinusitis    Assessment: 84 y/o M with a h/o atrial fibrillation on warfarin as well as GIB s/p endoscopy a few days PTA which pt was in hospital. currently admitted for dizziness and lightheadedness. Patient recently discharged on warfarin 1 mg daily but may have been taking 0.25 mg daily per med rec.   DATE INR DOSE  4/6 1.69 1 mg 4/7 1.71 1 mg 4/8 1.49 2 mg 4/9 1.45   Goal of Therapy:  INR 2-3   Plan:  Continue PTA dosing of warfarin 2 mg po daily except for 1 mg po on Sundays.  Laural Benes, Pharm.D., BCPS Clinical Pharmacist 01/10/2018,7:27 AM

## 2018-01-10 NOTE — Discharge Summary (Signed)
[facility] NAME: [name]    MR#:  [id number]  DATE OF BIRTH:  05-17-1933  DATE OF ADMISSION:  01/07/2018 ADMITTING PHYSICIAN: Fritzi Mandes, MD  DATE OF DISCHARGE: 01/10/2018  PRIMARY CARE PHYSICIAN: Tonia Ghent, MD    ADMISSION DIAGNOSIS:  Hyponatremia [E87.1] Gastrointestinal hemorrhage, unspecified gastrointestinal hemorrhage type [K92.2]  DISCHARGE DIAGNOSIS:  Active Problems:   Hyponatremia   SECONDARY DIAGNOSIS:   Past Medical History:  Diagnosis Date  . Atrial fibrillation ([place])   . Blind right eye   . CHF (congestive heart failure) ([place])   . Coronary atherosclerosis of unspecified type of vessel, native or graft 2008   cardioversion. Echo mod LVH EF 25%, [place] AR  Mild LAE, RAE 11/15/*11  . Diaphragmatic hernia without mention of obstruction or gangrene   . Diverticulosis of colon (without mention of hemorrhage)   . Dizziness and giddiness   . Encounter for long-term (current) use of other medications   . Encounter for therapeutic drug monitoring   . Esophageal reflux   . Gastritis 12/18/08   EGD, 3 clips remaining (Dr. Vira Agar)  . Hypertension   . Iron deficiency anemia, unspecified   . Left heart failure ([place])   . Long term (current) use of anticoagulants   . Macular degeneration   . Personal history of other diseases of digestive system   . Personal history of peptic ulcer disease   . Pure hypercholesterolemia   . Sprain of neck   . Unspecified hemorrhoids without mention of complication   . Unspecified sleep apnea   . Upper GI bleed 7/15-7/20/09   Hosp.  Elevated INR, coumadin stopped sinusitis    HOSPITAL COURSE:   84 year old male with past medical history of hypertension, gastritis, GERD, history of diverticulosis, CHF, atrial fibrillation, recent admission for GI bleed who presents to the hospital due to generalized weakness and dizziness and noted to be hyponatremic.  1.  Acute  hyponatremia- acute on chronic hyponatremia.  Secondary to volume overload. -Nephrology consult was obtained.  Patient was started on tolvaptan.  Patient responded well to it and sodium level has improved and normalized now.  Patient's sodium was as low as 117 on admission is up to 132 today. -Discussed with nephrology and patient will be discharged back on his diuretics except his Lasix has been changed to torsemide.  Patient will follow up with nephrology next 1-2 weeks.  2.  History of chronic atrial fibrillation-  this remained stable while in the hospital, patient will continue his amiodarone and Coumadin.   3.  History of recent GI bleed status post  Endoscopy showing angiodysplastic lesion in the stomach status post argon treatment. -he had no acute bleeding while in the hospital, hemoglobin remained stable.  4.  Essential hypertension- he will continue Toprol, enalapril.  5.  BPH- he will continue finasteride, Flomax  6.  GERD- he will continue Protonix.  Physical therapy and they recommend home health services.  Patient is being discharged home with home health nursing, physical therapy.   DISCHARGE CONDITIONS:   Stable  CONSULTS OBTAINED:  Treatment Team:  Lavonia Dana, MD  DRUG ALLERGIES:   Allergies  Allergen Reactions  . Atorvastatin Other (See Comments)    Other reaction(s): Unknown REACTION:muscular soreness  . Statins     Muscle aches on mult statins    DISCHARGE MEDICATIONS:   Allergies as of 01/10/2018      Reactions   Atorvastatin Other (See Comments)  Other reaction(s): Unknown REACTION:muscular soreness   Statins    Muscle aches on mult statins      Medication List    STOP taking these medications   furosemide 40 MG tablet Commonly known as:  LASIX     TAKE these medications   amiodarone 200 MG tablet Commonly known as:  PACERONE TAKE 1 TABLET BY MOUTH ONCE DAILY   cholecalciferol 1000 units tablet Commonly known as:  VITAMIN  D Take 1,000 Units by mouth daily.   cyanocobalamin 100 MCG tablet Take 100 mcg by mouth daily.   enalapril 5 MG tablet Commonly known as:  VASOTEC TAKE ONE TABLET BY MOUTH ONCE DAILY   eplerenone 25 MG tablet Commonly known as:  INSPRA TAKE 1 TABLET BY MOUTH ONCE DAILY   ferrous sulfate 325 (65 FE) MG tablet Take 325 mg by mouth 2 (two) times daily.   finasteride 5 MG tablet Commonly known as:  PROSCAR Take 1 tablet (5 mg total) by mouth daily.   fish oil-omega-3 fatty acids 1000 MG capsule Take 1 g by mouth daily.   hydroxypropyl methylcellulose / hypromellose 2.5 % ophthalmic solution Commonly known as:  ISOPTO TEARS / GONIOVISC Place 1 drop into both eyes every morning.   metoprolol succinate 50 MG 24 hr tablet Commonly known as:  TOPROL-XL TAKE ONE TABLET BY MOUTH ONCE DAILY WITH MEALS   multivitamin tablet Take 1 tablet by mouth daily.   omeprazole 20 MG capsule Commonly known as:  PRILOSEC Take 1 capsule (20 mg total) by mouth daily.   tamsulosin 0.4 MG Caps capsule Commonly known as:  FLOMAX Take 1 capsule (0.4 mg total) by mouth daily.   torsemide 20 MG tablet Commonly known as:  DEMADEX Take 1 tablet (20 mg total) by mouth daily.   vitamin C 500 MG tablet Commonly known as:  ASCORBIC ACID Take 500 mg by mouth daily.   warfarin 1 MG tablet Commonly known as:  COUMADIN Take as directed. If you are unsure how to take this medication, talk to your nurse or doctor. Original instructions:  Take 1 tablet (1 mg total) by mouth one time only at 6 PM. What changed:  how much to take         DISCHARGE INSTRUCTIONS:   DIET:  Cardiac diet  DISCHARGE CONDITION:  Stable  ACTIVITY:  Activity as tolerated  OXYGEN:  Home Oxygen: No.   Oxygen Delivery: room air  DISCHARGE LOCATION:  Home with home health nursing, physical therapy.  If you experience worsening of your admission symptoms, develop shortness of breath, life threatening emergency,  suicidal or homicidal thoughts you must seek medical attention immediately by calling 911 or calling your MD immediately  if symptoms less severe.  You Must read complete instructions/literature along with all the possible adverse reactions/side effects for all the Medicines you take and that have been prescribed to you. Take any new Medicines after you have completely understood and accpet all the possible adverse reactions/side effects.   Please note  You were cared for by a hospitalist during your hospital stay. If you have any questions about your discharge medications or the care you received while you were in the hospital after you are discharged, you can call the unit and asked to speak with the hospitalist on call if the hospitalist that took care of you is not available. Once you are discharged, your primary care physician will handle any further medical issues. Please note that NO REFILLS for any discharge medications  will be authorized once you are discharged, as it is imperative that you return to your primary care physician (or establish a relationship with a primary care physician if you do not have one) for your aftercare needs so that they can reassess your need for medications and monitor your lab values.     Today   Sitting up in chair no apparent distress.  Denies any nausea vomiting.  Feels much better.  Wants to go home.  VITAL SIGNS:  Blood pressure 132/79, pulse 73, temperature 98.3 F (36.8 C), temperature source Oral, resp. rate 16, height 5\' 11"  (1.803 m), weight 81.6 kg (180 lb), SpO2 98 %.  I/O:    Intake/Output Summary (Last 24 hours) at 01/10/2018 1415 Last data filed at 01/10/2018 1027 Gross per 24 hour  Intake 280 ml  Output 2875 ml  Net -2595 ml    PHYSICAL EXAMINATION:   GENERAL:  84 y.o.-year-old patient lying in bed in no acute distress.  EYES: Pupils equal, round, reactive to light and accommodation. No scleral icterus. Extraocular muscles intact.   HEENT: Head atraumatic, normocephalic. Oropharynx and nasopharynx clear.  NECK:  Supple, no jugular venous distention. No thyroid enlargement, no tenderness.  LUNGS: Normal breath sounds bilaterally, no wheezing, rales, rhonchi. No use of accessory muscles of respiration.  CARDIOVASCULAR: S1, S2 normal. No murmurs, rubs, or gallops.  ABDOMEN: Soft, nontender, nondistended. Bowel sounds present. No organomegaly or mass.  EXTREMITIES: No cyanosis, clubbing, +1 edema b/l.   NEUROLOGIC: Cranial nerves II through XII are intact. No focal Motor or sensory deficits b/l.  Globally weak.   PSYCHIATRIC: The patient is alert and oriented x 3.  SKIN: No obvious rash, lesion, or ulcer.   DATA REVIEW:   CBC Recent Labs  Lab 01/07/18 1409  WBC 3.7*  HGB 12.0*  HCT 36.3*  PLT 220    Chemistries  Recent Labs  Lab 01/08/18 0913  01/09/18 0335  01/10/18 0526  NA 120*   < > 122*   < > 132*  K 3.7   < > 3.8  --   --   CL 89*   < > 90*  --   --   CO2 25   < > 26  --   --   GLUCOSE 102*   < > 116*  --   --   BUN 7   < > 6  --   --   CREATININE 0.58*   < > 0.54*  --   --   CALCIUM 7.9*   < > 8.2*  --   --   AST 35  --   --   --   --   ALT 24  --   --   --   --   ALKPHOS 62  --   --   --   --   BILITOT 1.3*  --   --   --   --    < > = values in this interval not displayed.    Cardiac Enzymes No results for input(s): TROPONINI in the last 168 hours.  Microbiology Results  No results found for this or any previous visit.  RADIOLOGY:  No results found.    Management plans discussed with the patient, family and they are in agreement.  CODE STATUS:     Code Status Orders  (From admission, onward)        Start     Ordered   01/07/18 1811  Full code  Continuous     01/07/18 1811    Code Status History    Date Active Date Inactive Code Status Order ID Comments User Context   01/02/2018 1732 01/05/2018 1515 Full Code [id number]  Vaughan Basta, MD ED    Advance Directive  Documentation     Most Recent Value  Type of Advance Directive  Healthcare Power of Attorney, Living will  Pre-existing out of facility DNR order (yellow form or pink MOST form)  -  "MOST" Form in Place?  -      TOTAL TIME TAKING CARE OF THIS PATIENT: 40 minutes.    Henreitta Leber M.D on 01/10/2018 at 2:15 PM  Between 7am to 6pm - Pager - [phone]  After 6pm go to [email or web address] - Proofreader  [facility]  Office  [phone]  CC: Primary care physician; Tonia Ghent, MD

## 2018-01-10 NOTE — Progress Notes (Signed)
IV was removed. Discharge instructions and follow-up appointments were provided to the pt. All questions answered. The pt was taken downstairs via wheelchair by NT.

## 2018-01-10 NOTE — Care Management Important Message (Signed)
Copy left in patient's room. 

## 2018-01-10 NOTE — Evaluation (Signed)
Physical Therapy Evaluation Patient Details Name: [name] MRN: [id number] DOB: 1933/07/29 Today's Date: 01/10/2018   History of Present Illness  Pt admitted for hyponatremia with complaints of weakness and dizziness. HIstory includes HTN, gastritis, GERD, and afib. Pt with recent admission for GI bleed.   Clinical Impression  Pt is a pleasant 83 year old male who was admitted for hyponatremia. Pt performs bed mobility mod I, transfers with min assist, and ambulation with cga and RW. Initially, ambulated without AD, however very unsteady and reaching for B UE support. Gave RW with improvement in balance. Recommend continued use at this time for fall prevention. Pt demonstrates deficits with strength/mobility/balance. Recent admission to hospital, slightly weak, however refusing further PT services. Would benefit from skilled PT to address above deficits and promote optimal return to PLOF. Recommend transition to [facility] upon discharge from acute hospitalization.       Follow Up Recommendations Home health PT(however pt currently refusing)    Equipment Recommendations  None recommended by PT    Recommendations for Other Services       Precautions / Restrictions Precautions Precautions: Fall Restrictions Weight Bearing Restrictions: No      Mobility  Bed Mobility Overal bed mobility: Modified Independent             General bed mobility comments: uses railing for transitioning to sit at EOB. Once seated, pt reports slight dizziness  Transfers Overall transfer level: Needs assistance Equipment used: None Transfers: Sit to/from Stand Sit to Stand: Min assist         General transfer comment: uses therapist to hold onto, however reaching for furniture despite cues for safety. Unsteady gait noted  Ambulation/Gait Ambulation/Gait assistance: Min guard Ambulation Distance (Feet): 40 Feet Assistive device: None Gait Pattern/deviations: Step-to pattern      General Gait Details: ambulated using HHA assist to bathroom, unsteadiness noted. RW used for ambulation back from bathrom around room. Improved balance with AD, adjusted to height.  Stairs            Wheelchair Mobility    Modified Rankin (Stroke Patients Only)       Balance Overall balance assessment: Needs assistance Sitting-balance support: Feet supported Sitting balance-Leahy Scale: Good     Standing balance support: Bilateral upper extremity supported Standing balance-Leahy Scale: Fair                               Pertinent Vitals/Pain Pain Assessment: No/denies pain    Home Living Family/patient expects to be discharged to:: Private residence Living Arrangements: Spouse/significant other Available Help at Discharge: Family Type of Home: House Home Access: Stairs to enter Entrance Stairs-Rails: Can reach both Entrance Stairs-Number of Steps: 3 Home Layout: One level Home Equipment: [place] - 2 wheels;Cane - single point      Prior Function Level of Independence: Independent with assistive device(s)         Comments: uses SPC for all mobility, reports no falls     Hand Dominance        Extremity/Trunk Assessment   Upper Extremity Assessment Upper Extremity Assessment: Overall WFL for tasks assessed    Lower Extremity Assessment Lower Extremity Assessment: Generalized weakness(B LE grossly 4/5)       Communication   Communication: No difficulties  Cognition Arousal/Alertness: Awake/alert Behavior During Therapy: WFL for tasks assessed/performed Overall Cognitive Status: Impaired/Different from baseline  General Comments      Exercises Other Exercises Other Exercises: Pt able to ambulate to bathroom and use bathroom with safe technique and cga. Needs slight assist to stand from lower toliet and cues for AD use.   Assessment/Plan    PT Assessment Patient needs  continued PT services  PT Problem List Decreased strength;Decreased activity tolerance;Decreased balance;Decreased mobility       PT Treatment Interventions DME instruction;Gait training;Therapeutic exercise;Balance training    PT Goals (Current goals can be found in the Care Plan section)  Acute Rehab PT Goals Patient Stated Goal: to go home PT Goal Formulation: With patient Time For Goal Achievement: 01/24/18 Potential to Achieve Goals: Good    Frequency Min 2X/week   Barriers to discharge        Co-evaluation               AM-PAC PT "6 Clicks" Daily Activity  Outcome Measure Difficulty turning over in bed (including adjusting bedclothes, sheets and blankets)?: None Difficulty moving from lying on back to sitting on the side of the bed? : None Difficulty sitting down on and standing up from a chair with arms (e.g., wheelchair, bedside commode, etc,.)?: Unable Help needed moving to and from a bed to chair (including a wheelchair)?: A Little Help needed walking in hospital room?: A Little Help needed climbing 3-5 steps with a railing? : A Little 6 Click Score: 18    End of Session Equipment Utilized During Treatment: Gait belt Activity Tolerance: Patient tolerated treatment well Patient left: in chair;with chair alarm set Nurse Communication: Mobility status PT Visit Diagnosis: Unsteadiness on feet (R26.81);Difficulty in walking, not elsewhere classified (R26.2)    Time: 0941-1000 PT Time Calculation (min) (ACUTE ONLY): 19 min   Charges:   PT Evaluation $PT Eval Low Complexity: 1 Low PT Treatments $Therapeutic Activity: 8-22 mins   PT G CodesGreggory Parsons, PT, DPT [phone]   [name] 01/10/2018, 12:00 PM

## 2018-01-10 NOTE — Progress Notes (Signed)
[facility], [place] 01/10/18  Subjective:   Patient is doing fair No complaints Continues to have some edema in the legs Sitting up in the chair.  Asking about discharge Sodium level has improved to 132 this morning Urine output greater than 3700 cc from last 24 hours   Objective:  Vital signs in last 24 hours:  Temp:  [97.5 F (36.4 C)-98.3 F (36.8 C)] 98 F (36.7 C) (04/09 0435) Pulse Rate:  [68-73] 70 (04/09 0435) Resp:  [18-20] 18 (04/09 0435) BP: (121-145)/(70-79) 145/72 (04/09 0435) SpO2:  [93 %-100 %] 100 % (04/09 0435)  Weight change:  Filed Weights   01/07/18 1357  Weight: 180 lb (81.6 kg)    Intake/Output:    Intake/Output Summary (Last 24 hours) at 01/10/2018 0945 Last data filed at 01/10/2018 0514 Gross per 24 hour  Intake 517 ml  Output 3475 ml  Net -2958 ml     Physical Exam: General:  No acute distress, sitting up in the chair  HEENT  anicteric, moist oral mucous membranes  Neck  supple  Pulm/lungs  normal breathing effort, mild basilar crackles  CVS/Heart  no rub  Abdomen:   Soft, nontender  Extremities:  2+ pitting edema up to thighs  Neurologic:  Alert, oriented  Skin:  No acute rashes          Basic Metabolic Panel:  Recent Labs  Lab 01/07/18 1409 01/08/18 0913 01/08/18 1101 01/08/18 1852 01/09/18 0335 01/09/18 1501 01/10/18 0526  NA 121* 120* 118* 117* 122* 126* 132*  K 3.7 3.7 3.6  --  3.8  --   --   CL 87* 89* 87*  --  90*  --   --   CO2 24 25 24   --  26  --   --   GLUCOSE 107* 102* 119*  --  116*  --   --   BUN 7 7 8   --  6  --   --   CREATININE 0.59* 0.58* 0.56*  --  0.54*  --   --   CALCIUM 8.1* 7.9* 7.8*  --  8.2*  --   --      CBC: Recent Labs  Lab 01/04/18 0550 01/07/18 1409  WBC  --  3.7*  NEUTROABS  --  2.6  HGB 10.8* 12.0*  HCT  --  36.3*  MCV  --  80.5  PLT  --  220     No results found for: HEPBSAG, HEPBSAB, HEPBIGM    Microbiology:  No results found for this or  any previous visit (from the past 240 hour(s)).  Coagulation Studies: Recent Labs    01/07/18 1409 01/08/18 0507 01/09/18 0335 01/10/18 0526  LABPROT 19.7* 19.9* 17.9* 17.5*  INR 1.69 1.71 1.49 1.45    Urinalysis: Recent Labs    01/08/18 2015  COLORURINE YELLOW*  LABSPEC 1.016  PHURINE 5.0  GLUCOSEU NEGATIVE  HGBUR NEGATIVE  BILIRUBINUR NEGATIVE  KETONESUR NEGATIVE  PROTEINUR 30*  NITRITE NEGATIVE  LEUKOCYTESUR NEGATIVE      Imaging: No results found.   Medications:    . amiodarone  200 mg Oral Daily  . cholecalciferol  1,000 Units Oral Daily  . enalapril  5 mg Oral Daily  . ferrous sulfate  325 mg Oral BID  . finasteride  5 mg Oral Daily  . metoprolol succinate  50 mg Oral Daily  . multivitamin with minerals  1 tablet Oral Daily  . omega-3 acid ethyl esters  1  g Oral Daily  . pantoprazole  40 mg Oral Daily  . polyvinyl alcohol  1 drop Both Eyes q morning - 10a  . protein supplement shake  11 oz Oral BID BM  . tamsulosin  0.4 mg Oral Daily  . cyanocobalamin  100 mcg Oral Daily  . vitamin C  500 mg Oral Daily  . warfarin  2 mg Oral Once per day on Mon Tue Wed Thu Fri Sat   And  . [START ON 01/15/2018] warfarin  1 mg Oral Once per day on Sun  . Warfarin - Pharmacist Dosing Inpatient   Does not apply q1800   acetaminophen **OR** acetaminophen, alum & mag hydroxide-simeth, diphenhydrAMINE, ondansetron **OR** ondansetron (ZOFRAN) IV, polyethylene glycol  Assessment/ Plan:  84 y.o. 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  Hyponatremia Chronic.  With acute worsening. Likely related  to volume overload Asked patient to follow fluid restriction Sodium improved with tolvaptan.   Add torsemide at low-dose  2.  Lower extremity edema Fluid restriction, torsemide Outpatient follow-up w/Dr [facility] May 6th   LOS: 3 Willow Reczek Candiss Norse 4/9/20199:45 AM  [place], Angie  Note: This note was prepared with Dragon dictation. Any transcription errors are unintentional

## 2018-01-10 NOTE — Care Management Note (Signed)
Case Management Note  Patient Details  Name: [name] MRN: [id number] Date of Birth: 23-Feb-1933  Patient to discharge home today.  Patient lives at home with wife.  PCP Damita Dunnings.  PT has assessed patient and they recommend home health PT.  Patient agreeable to services.  Does not have preference of agency.  Referral made to [facility] with [place].  Patient states that he has a RW and cane in the home for ambulation if needed.  RNCM signing off.   Subjective/Objective:                    Action/Plan:   Expected Discharge Date:  01/10/18               Expected Discharge Plan:  [place]  In-House Referral:     Discharge planning Services  CM Consult  Post Acute Care Choice:  Home Health Choice offered to:  Patient  DME Arranged:    DME Agency:     HH Arranged:  RN, PT [place] Agency:  [facility]  Status of Service:  Completed, signed off  If discussed at [place] of Stay Meetings, dates discussed:    Additional Comments:  Beverly Sessions, RN 01/10/2018, 2:09 PM

## 2018-01-11 ENCOUNTER — Telehealth: Payer: Self-pay

## 2018-01-11 NOTE — Telephone Encounter (Signed)
Per PCP, provided the following instructions to patient and spouse:  Please seek treatment at ER if patient experiences shortness of breath, chest pain, or weight gain of 2 lbs or more in a 24 hours period.   Patient and spouse verbalized understanding.   Patient was encouraged to schedule next day appt with PCP but he declined. Hospital f/u appt is scheduled 01/13/18.   Patient stated he had just showered, shaved, and was feeling better.

## 2018-01-11 NOTE — Telephone Encounter (Signed)
Transitional Care Management Follow-up Telephone Call    Date discharged? 01/10/2018  How have you been since you were released from the hospital? [place].   Any patient concerns? Bilateral ankles and feet are swollen.    Items Reviewed:  Medications reviewed: Yes  Allergies reviewed: Yes  Dietary changes reviewed: Yes, cardiac diet  Referrals reviewed: Yes, nephrology and HH nursing/PT   Functional Questionnaire:  Independent - I Dependent - D    Activities of Daily Living (ADLs):    Personal hygiene - Needs assistance (spouse) Dressing - Needs assistance (spouse) Eating - I Maintaining continence - I Transferring - Needs assistance (cane, walker)  Independent Activities of Daily Living (iADLs): Basic communication skills - I Transportation - D (pt stopped driving 2 mths ago) Meal preparation  - D (spouse) Shopping - D (spouse) Housework - D (spouse) Managing medications - D (spouse) Managing personal finances - D (spouse)   Confirmed importance and date/time of follow-up visits scheduled YES  Provider Appointment booked with PCP 01/13/18 @ 1130  Confirmed with patient if condition begins to worsen call PCP or go to the ER.  Patient was given the office number and encouraged to call back with question or concerns: YES

## 2018-01-12 ENCOUNTER — Telehealth: Payer: Self-pay | Admitting: Family Medicine

## 2018-01-12 DIAGNOSIS — I2510 Atherosclerotic heart disease of native coronary artery without angina pectoris: Secondary | ICD-10-CM | POA: Diagnosis not present

## 2018-01-12 DIAGNOSIS — Z8711 Personal history of peptic ulcer disease: Secondary | ICD-10-CM | POA: Diagnosis not present

## 2018-01-12 DIAGNOSIS — I482 Chronic atrial fibrillation: Secondary | ICD-10-CM | POA: Diagnosis not present

## 2018-01-12 DIAGNOSIS — K922 Gastrointestinal hemorrhage, unspecified: Secondary | ICD-10-CM | POA: Diagnosis not present

## 2018-01-12 DIAGNOSIS — I5022 Chronic systolic (congestive) heart failure: Secondary | ICD-10-CM | POA: Diagnosis not present

## 2018-01-12 DIAGNOSIS — Z7901 Long term (current) use of anticoagulants: Secondary | ICD-10-CM | POA: Diagnosis not present

## 2018-01-12 DIAGNOSIS — I110 Hypertensive heart disease with heart failure: Secondary | ICD-10-CM | POA: Diagnosis not present

## 2018-01-12 DIAGNOSIS — G4730 Sleep apnea, unspecified: Secondary | ICD-10-CM | POA: Diagnosis not present

## 2018-01-12 DIAGNOSIS — D509 Iron deficiency anemia, unspecified: Secondary | ICD-10-CM | POA: Diagnosis not present

## 2018-01-12 DIAGNOSIS — H5461 Unqualified visual loss, right eye, normal vision left eye: Secondary | ICD-10-CM | POA: Diagnosis not present

## 2018-01-12 DIAGNOSIS — N400 Enlarged prostate without lower urinary tract symptoms: Secondary | ICD-10-CM | POA: Diagnosis not present

## 2018-01-12 DIAGNOSIS — K219 Gastro-esophageal reflux disease without esophagitis: Secondary | ICD-10-CM | POA: Diagnosis not present

## 2018-01-12 DIAGNOSIS — R42 Dizziness and giddiness: Secondary | ICD-10-CM | POA: Diagnosis not present

## 2018-01-12 DIAGNOSIS — E871 Hypo-osmolality and hyponatremia: Secondary | ICD-10-CM | POA: Diagnosis not present

## 2018-01-12 NOTE — Telephone Encounter (Signed)
Noted  

## 2018-01-12 NOTE — Telephone Encounter (Signed)
Copied from [place] [phone]. Topic: General - Other >> Jan 12, 2018 12:16 PM [name] wrote: Reason for CRM:  [name], with [facility] needs verbal order for nursing 1x a wk for 1wk and 2x a wk for 3wks.

## 2018-01-12 NOTE — Telephone Encounter (Signed)
Noted. Thanks.

## 2018-01-12 NOTE — Telephone Encounter (Signed)
Please give the order.  Thanks.   

## 2018-01-12 NOTE — Telephone Encounter (Signed)
Order given to [facility] with [facility].

## 2018-01-13 ENCOUNTER — Ambulatory Visit: Payer: Self-pay

## 2018-01-13 ENCOUNTER — Encounter: Payer: Self-pay | Admitting: Family Medicine

## 2018-01-13 ENCOUNTER — Ambulatory Visit (INDEPENDENT_AMBULATORY_CARE_PROVIDER_SITE_OTHER): Payer: Medicare Other | Admitting: Family Medicine

## 2018-01-13 VITALS — BP 104/48 | HR 78 | Temp 97.8°F | Wt 174.0 lb

## 2018-01-13 DIAGNOSIS — I4891 Unspecified atrial fibrillation: Secondary | ICD-10-CM

## 2018-01-13 DIAGNOSIS — E871 Hypo-osmolality and hyponatremia: Secondary | ICD-10-CM | POA: Diagnosis not present

## 2018-01-13 DIAGNOSIS — Z7901 Long term (current) use of anticoagulants: Secondary | ICD-10-CM

## 2018-01-13 DIAGNOSIS — I5022 Chronic systolic (congestive) heart failure: Secondary | ICD-10-CM | POA: Diagnosis not present

## 2018-01-13 DIAGNOSIS — D508 Other iron deficiency anemias: Secondary | ICD-10-CM | POA: Diagnosis not present

## 2018-01-13 LAB — CBC WITH DIFFERENTIAL/PLATELET
BASOS ABS: 0.1 10*3/uL (ref 0.0–0.1)
Basophils Relative: 1.7 % (ref 0.0–3.0)
EOS ABS: 0 10*3/uL (ref 0.0–0.7)
Eosinophils Relative: 1.1 % (ref 0.0–5.0)
HEMATOCRIT: 31.2 % — AB (ref 39.0–52.0)
Hemoglobin: 10.3 g/dL — ABNORMAL LOW (ref 13.0–17.0)
LYMPHS PCT: 26.4 % (ref 12.0–46.0)
Lymphs Abs: 0.9 10*3/uL (ref 0.7–4.0)
MCHC: 33 g/dL (ref 30.0–36.0)
MCV: 81.4 fl (ref 78.0–100.0)
MONOS PCT: 16.2 % — AB (ref 3.0–12.0)
Monocytes Absolute: 0.5 10*3/uL (ref 0.1–1.0)
Neutro Abs: 1.8 10*3/uL (ref 1.4–7.7)
Neutrophils Relative %: 54.6 % (ref 43.0–77.0)
PLATELETS: 202 10*3/uL (ref 150.0–400.0)
RBC: 3.83 Mil/uL — AB (ref 4.22–5.81)
RDW: 16.2 % — ABNORMAL HIGH (ref 11.5–15.5)
WBC: 3.2 10*3/uL — AB (ref 4.0–10.5)

## 2018-01-13 LAB — COMPREHENSIVE METABOLIC PANEL
ALK PHOS: 54 U/L (ref 39–117)
ALT: 28 U/L (ref 0–53)
AST: 32 U/L (ref 0–37)
Albumin: 3.4 g/dL — ABNORMAL LOW (ref 3.5–5.2)
BILIRUBIN TOTAL: 0.8 mg/dL (ref 0.2–1.2)
BUN: 21 mg/dL (ref 6–23)
CALCIUM: 8.4 mg/dL (ref 8.4–10.5)
CO2: 38 meq/L — AB (ref 19–32)
CREATININE: 0.89 mg/dL (ref 0.40–1.50)
Chloride: 92 mEq/L — ABNORMAL LOW (ref 96–112)
GFR: 86.8 mL/min (ref >60.00)
Glucose, Bld: 109 mg/dL — ABNORMAL HIGH (ref 70–99)
Potassium: 3.8 mEq/L (ref 3.5–5.1)
Sodium: 135 mEq/L (ref 135–145)
TOTAL PROTEIN: 6 g/dL (ref 6.0–8.3)

## 2018-01-13 LAB — PROTIME-INR
INR: 1.3 ratio — ABNORMAL HIGH (ref 0.8–1.0)
PROTHROMBIN TIME: 14.3 s — AB (ref 9.6–13.1)

## 2018-01-13 LAB — POCT INR: INR: 1.3

## 2018-01-13 IMAGING — DX DG CHEST 1V PORT
1 series · 1 of 1 positions shown · non-contrast
Comparison: Portable exam 3636 hours compared to 09/09/2010

CLINICAL DATA: Syncope today, history hypertension, CHF, CABG,
atrial fibrillation, MVR, coronary artery disease

EXAM:
PORTABLE CHEST 1 VIEW

[chest ap]
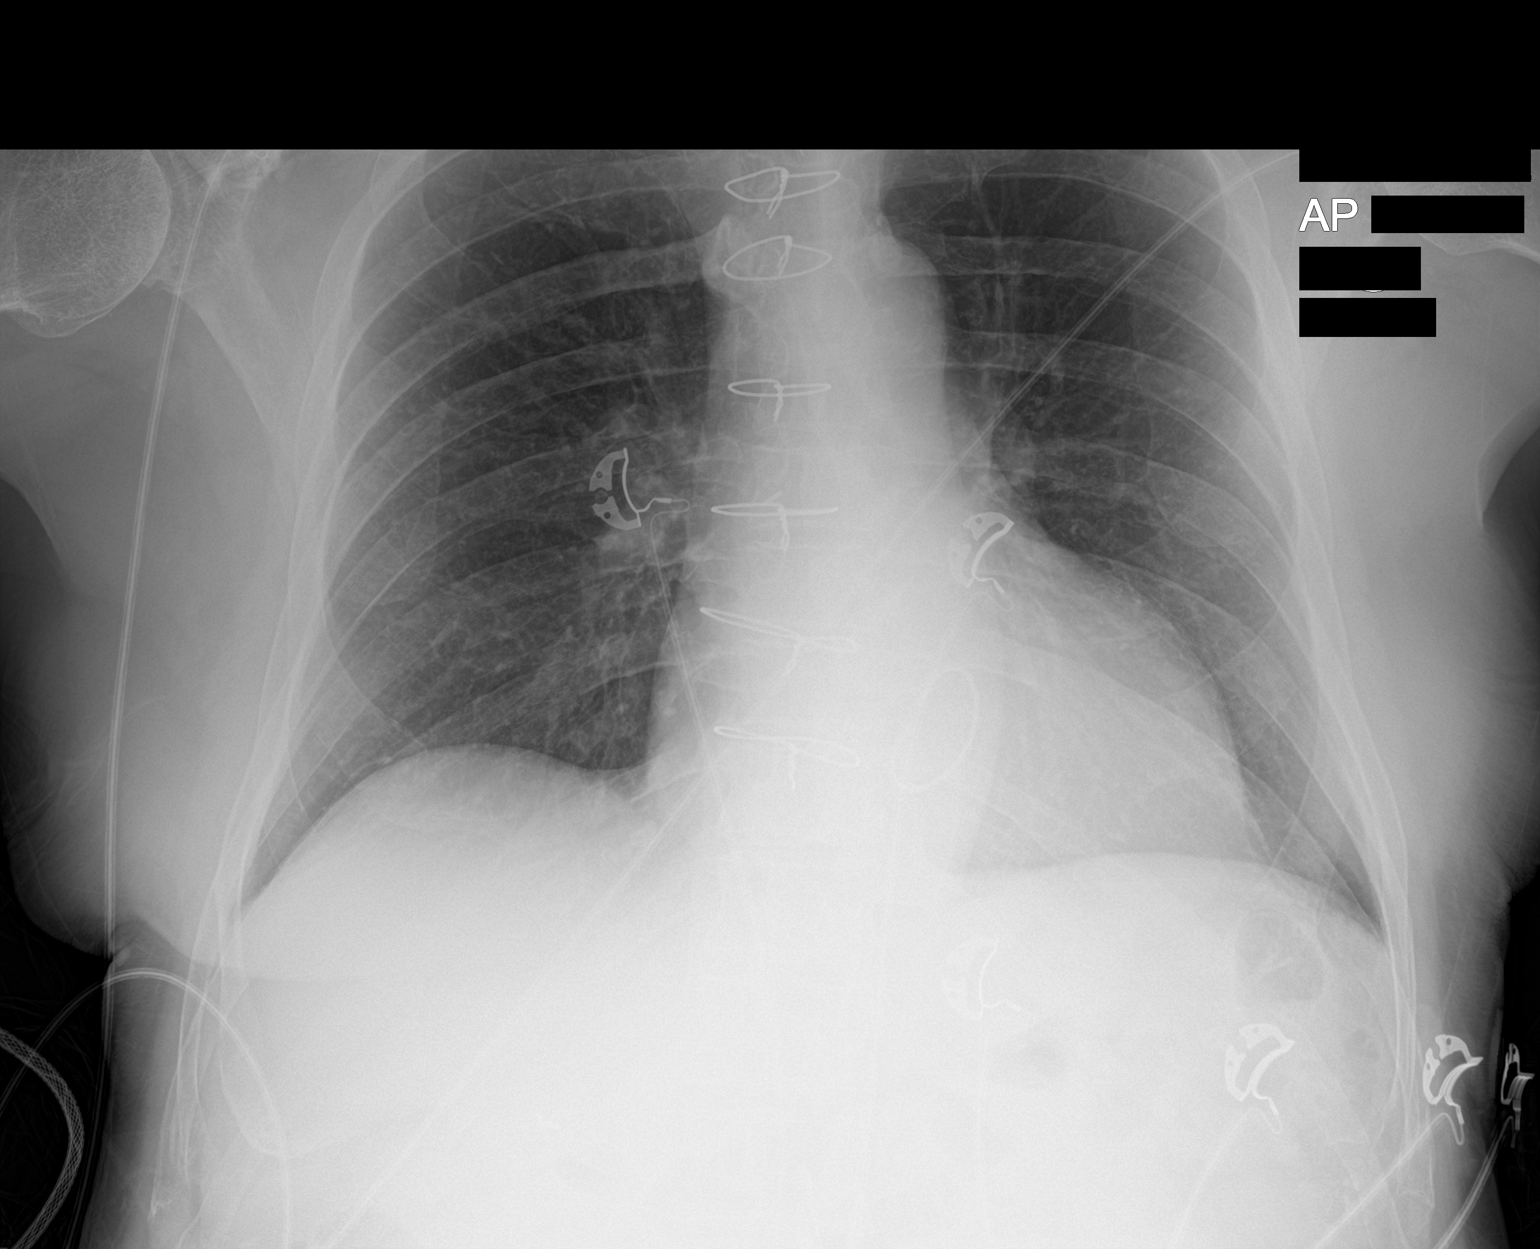

[1 of 1 positions shown; findings below may reference images not displayed]

FINDINGS: Normal heart size post median sternotomy and MVR.

Mediastinal contours and pulmonary vascularity normal.

Lungs clear.

No pleural effusion or pneumothorax.

Bones demineralized.
IMPRESSION: No acute abnormalities.

## 2018-01-13 NOTE — Progress Notes (Signed)
Inpatient follow-up.  Inpatient course discussed with patient.  Recent labs discussed with patient.  Multiple recent admissions.  Still on anticoagulation.  He is taking coumadin 1 mg/day, 7 mg/week in total.  No bleeding in the meantime.  He previously did have GI bleed noted.  Discussed with patient about adherence to Coumadin. I have talked with Leafy Ro about it here in the clinic.  We will not follow his Coumadin if he has it drawn at outside facilities anymore.  Will need to have follow-up here. All d/w pt. he has to be able to comply or we will not be able to follow him anymore.  He understood.  Low Na s/p correction with admission.  This was attributed to volume overload.  Due for follow-up labs.  He feels better, using a cane to walk, but still a little weak.  "I'm getting better every day."  He was taken off lasix and changed to torsemide.    No more bleeding.  Not SOB now.  No CP.  Swelling is better but not totally resolved.   He has PT and HH is coming to the house.    PMH and SH reviewed  ROS: Per HPI unless specifically indicated in ROS section   Meds, vitals, and allergies reviewed.   nad ncat Mmm Neck supple, no LA Sound to be RRR abd soft, not ttp, normal BS Trace BLE edema Gait appears at baseline, normal.  Speech and judgment appear intact.  See avs.

## 2018-01-13 NOTE — Patient Instructions (Addendum)
If you don't have an appointment with the kidney clinic, then let me know.   Go to the lab on the way out.  We'll contact you with your lab report. You have to get your coumadin checked here at the clinic.  Otherwise we can't manage your coumadin.  We won't manage your comadin through outside lab draws (meaning [facility], etc).  Take care.  Glad to see you.   See Leafy Ro on April 18th at [facility]

## 2018-01-13 NOTE — Patient Instructions (Signed)
INR TODAY 1.3 (4/12)  *patient has been taking 1mg  daily this past week while in hospital (per pt report).    He is to take 2mg  today and tomorrow (4/12, 4/13) and then resume taking 1 pill daily until recheck next Thursday 4/18.  Patient able to verbalize back instructions.    He is also aware to bring his pill bottles and pill box with him to Coumadin Clinic appointment next week and verbalizes understanding that he must keep clinic appointment.

## 2018-01-14 NOTE — Assessment & Plan Note (Addendum)
With recent hyponatremia attributed to fluid overload status post correction and here for follow-up.  No chest pain.  Not short of breath now.  Still okay for outpatient follow-up.  No change in meds at this point.  See notes on follow-up labs. >25 minutes spent in face to face time with patient, >50% spent in counselling or coordination of care, discussing anticoagulation, inpatient course, rationale for treatment, etc.  See after visit summary.  If he does not have follow-up scheduled with the kidney clinic then he can let me know.

## 2018-01-14 NOTE — Progress Notes (Signed)
Agree. Thanks

## 2018-01-14 NOTE — Assessment & Plan Note (Signed)
See above about anticoagulation.  He will have to have routine follow-up here.  We will not manage his labs if they are drawn at outside facilities.  He has to keep follow-up and be compliant here or we will not be able to follow him for anticoagulation.  Discussed with patient.  He understood.  Labs today.  No bleeding recently noted, since hospitalization.  See notes on labs.

## 2018-01-16 ENCOUNTER — Other Ambulatory Visit: Payer: Self-pay

## 2018-01-16 ENCOUNTER — Inpatient Hospital Stay
Admission: EM | Admit: 2018-01-16 | Discharge: 2018-01-19 | DRG: 378 | Disposition: A | Discharge status: Home Health | Payer: Medicare Other | Attending: Family Medicine | Admitting: Family Medicine

## 2018-01-16 ENCOUNTER — Encounter: Payer: Self-pay | Admitting: Emergency Medicine

## 2018-01-16 DIAGNOSIS — E7800 Pure hypercholesterolemia, unspecified: Secondary | ICD-10-CM | POA: Diagnosis not present

## 2018-01-16 DIAGNOSIS — I480 Paroxysmal atrial fibrillation: Secondary | ICD-10-CM | POA: Diagnosis present

## 2018-01-16 DIAGNOSIS — K31811 Angiodysplasia of stomach and duodenum with bleeding: Principal | ICD-10-CM | POA: Diagnosis present

## 2018-01-16 DIAGNOSIS — D509 Iron deficiency anemia, unspecified: Secondary | ICD-10-CM | POA: Diagnosis present

## 2018-01-16 DIAGNOSIS — K31819 Angiodysplasia of stomach and duodenum without bleeding: Secondary | ICD-10-CM | POA: Diagnosis not present

## 2018-01-16 DIAGNOSIS — K921 Melena: Secondary | ICD-10-CM | POA: Diagnosis not present

## 2018-01-16 DIAGNOSIS — I5022 Chronic systolic (congestive) heart failure: Secondary | ICD-10-CM | POA: Diagnosis not present

## 2018-01-16 DIAGNOSIS — I428 Other cardiomyopathies: Secondary | ICD-10-CM | POA: Diagnosis present

## 2018-01-16 DIAGNOSIS — R531 Weakness: Secondary | ICD-10-CM | POA: Diagnosis not present

## 2018-01-16 DIAGNOSIS — K219 Gastro-esophageal reflux disease without esophagitis: Secondary | ICD-10-CM | POA: Diagnosis present

## 2018-01-16 DIAGNOSIS — I482 Chronic atrial fibrillation: Secondary | ICD-10-CM | POA: Diagnosis not present

## 2018-01-16 DIAGNOSIS — I110 Hypertensive heart disease with heart failure: Secondary | ICD-10-CM | POA: Diagnosis present

## 2018-01-16 DIAGNOSIS — Z85118 Personal history of other malignant neoplasm of bronchus and lung: Secondary | ICD-10-CM | POA: Diagnosis not present

## 2018-01-16 DIAGNOSIS — I509 Heart failure, unspecified: Secondary | ICD-10-CM | POA: Diagnosis not present

## 2018-01-16 DIAGNOSIS — E871 Hypo-osmolality and hyponatremia: Secondary | ICD-10-CM | POA: Diagnosis present

## 2018-01-16 DIAGNOSIS — D62 Acute posthemorrhagic anemia: Secondary | ICD-10-CM | POA: Diagnosis not present

## 2018-01-16 DIAGNOSIS — K922 Gastrointestinal hemorrhage, unspecified: Secondary | ICD-10-CM

## 2018-01-16 DIAGNOSIS — K250 Acute gastric ulcer with hemorrhage: Secondary | ICD-10-CM | POA: Diagnosis not present

## 2018-01-16 DIAGNOSIS — H5461 Unqualified visual loss, right eye, normal vision left eye: Secondary | ICD-10-CM | POA: Diagnosis not present

## 2018-01-16 DIAGNOSIS — H3530 Unspecified macular degeneration: Secondary | ICD-10-CM | POA: Diagnosis not present

## 2018-01-16 DIAGNOSIS — G4730 Sleep apnea, unspecified: Secondary | ICD-10-CM | POA: Diagnosis present

## 2018-01-16 DIAGNOSIS — K449 Diaphragmatic hernia without obstruction or gangrene: Secondary | ICD-10-CM | POA: Diagnosis present

## 2018-01-16 DIAGNOSIS — K254 Chronic or unspecified gastric ulcer with hemorrhage: Secondary | ICD-10-CM | POA: Diagnosis present

## 2018-01-16 DIAGNOSIS — Z9049 Acquired absence of other specified parts of digestive tract: Secondary | ICD-10-CM | POA: Diagnosis not present

## 2018-01-16 DIAGNOSIS — Z7901 Long term (current) use of anticoagulants: Secondary | ICD-10-CM

## 2018-01-16 DIAGNOSIS — K3189 Other diseases of stomach and duodenum: Secondary | ICD-10-CM

## 2018-01-16 DIAGNOSIS — K259 Gastric ulcer, unspecified as acute or chronic, without hemorrhage or perforation: Secondary | ICD-10-CM | POA: Diagnosis not present

## 2018-01-16 DIAGNOSIS — I2510 Atherosclerotic heart disease of native coronary artery without angina pectoris: Secondary | ICD-10-CM | POA: Diagnosis present

## 2018-01-16 LAB — CBC
HCT: 28.3 % — ABNORMAL LOW (ref 40.0–52.0)
HEMOGLOBIN: 9.2 g/dL — AB (ref 13.0–18.0)
MCH: 26.5 pg (ref 26.0–34.0)
MCHC: 32.5 g/dL (ref 32.0–36.0)
MCV: 81.5 fL (ref 80.0–100.0)
PLATELETS: 212 10*3/uL (ref 150–440)
RBC: 3.47 MIL/uL — AB (ref 4.40–5.90)
RDW: 16.7 % — ABNORMAL HIGH (ref 11.5–14.5)
WBC: 2.7 10*3/uL — AB (ref 3.8–10.6)

## 2018-01-16 LAB — COMPREHENSIVE METABOLIC PANEL
ALT: 49 U/L (ref 17–63)
ANION GAP: 6 (ref 5–15)
AST: 60 U/L — ABNORMAL HIGH (ref 15–41)
Albumin: 3.4 g/dL — ABNORMAL LOW (ref 3.5–5.0)
Alkaline Phosphatase: 58 U/L (ref 38–126)
BUN: 17 mg/dL (ref 6–20)
CHLORIDE: 97 mmol/L — AB (ref 101–111)
CO2: 28 mmol/L (ref 22–32)
CREATININE: 0.72 mg/dL (ref 0.61–1.24)
Calcium: 8.4 mg/dL — ABNORMAL LOW (ref 8.9–10.3)
Glucose, Bld: 101 mg/dL — ABNORMAL HIGH (ref 65–99)
POTASSIUM: 4.1 mmol/L (ref 3.5–5.1)
SODIUM: 131 mmol/L — AB (ref 135–145)
Total Bilirubin: 0.9 mg/dL (ref 0.3–1.2)
Total Protein: 6.4 g/dL — ABNORMAL LOW (ref 6.5–8.1)

## 2018-01-16 LAB — TYPE AND SCREEN
ABO/RH(D): O POS
ANTIBODY SCREEN: NEGATIVE

## 2018-01-16 LAB — HEMOGLOBIN: Hemoglobin: 9.4 g/dL — ABNORMAL LOW (ref 13.0–18.0)

## 2018-01-16 LAB — PROTIME-INR
INR: 1.28
Prothrombin Time: 15.9 seconds — ABNORMAL HIGH (ref 11.4–15.2)

## 2018-01-16 MED ORDER — TRAMADOL HCL 50 MG PO TABS: 50.0000 mg | ORAL_TABLET | Freq: Four times a day (QID) | ORAL | Status: DC | PRN

## 2018-01-16 MED ORDER — SODIUM CHLORIDE 0.9 % IV SOLN
INTRAVENOUS | Status: DC
  Administered 2018-01-16 – 2018-01-18 (×4): via INTRAVENOUS

## 2018-01-16 MED ORDER — POLYETHYLENE GLYCOL 3350 17 G PO PACK: 17.0000 g | PACK | Freq: Every day | ORAL | Status: DC | PRN

## 2018-01-16 MED ORDER — POLYETHYLENE GLYCOL 3350 17 GM/SCOOP PO POWD
1.0000 | Freq: Once | ORAL | Status: AC
  Administered 2018-01-17: 255 g via ORAL
  Filled 2018-01-16 (×2): qty 255

## 2018-01-16 MED ORDER — FINASTERIDE 5 MG PO TABS
5.0000 mg | ORAL_TABLET | Freq: Every day | ORAL | Status: DC
  Administered 2018-01-17 – 2018-01-19 (×3): 5 mg via ORAL
  Filled 2018-01-16 (×3): qty 1

## 2018-01-16 MED ORDER — ACETAMINOPHEN 325 MG PO TABS
650.0000 mg | ORAL_TABLET | Freq: Four times a day (QID) | ORAL | Status: DC | PRN
  Administered 2018-01-18 – 2018-01-19 (×2): 650 mg via ORAL
  Filled 2018-01-16 (×2): qty 2

## 2018-01-16 MED ORDER — PANTOPRAZOLE SODIUM 40 MG IV SOLR
8.0000 mg/h | INTRAVENOUS | Status: DC
  Administered 2018-01-16 – 2018-01-18 (×6): 8 mg/h via INTRAVENOUS
  Filled 2018-01-16 (×4): qty 80
  Filled 2018-01-16: qty 40
  Filled 2018-01-16: qty 80

## 2018-01-16 MED ORDER — TORSEMIDE 20 MG PO TABS
20.0000 mg | ORAL_TABLET | Freq: Every day | ORAL | Status: DC
  Administered 2018-01-17 – 2018-01-19 (×3): 20 mg via ORAL
  Filled 2018-01-16 (×3): qty 1

## 2018-01-16 MED ORDER — POLYVINYL ALCOHOL 1.4 % OP SOLN
1.0000 [drp] | Freq: Every day | OPHTHALMIC | Status: DC
  Administered 2018-01-17 – 2018-01-19 (×3): 1 [drp] via OPHTHALMIC
  Filled 2018-01-16: qty 15

## 2018-01-16 MED ORDER — BISACODYL 5 MG PO TBEC
10.0000 mg | DELAYED_RELEASE_TABLET | Freq: Once | ORAL | Status: AC
  Administered 2018-01-17: 10 mg via ORAL
  Filled 2018-01-16 (×2): qty 2

## 2018-01-16 MED ORDER — SODIUM CHLORIDE 0.9 % IV SOLN
80.0000 mg | Freq: Once | INTRAVENOUS | Status: AC
  Administered 2018-01-16: 80 mg via INTRAVENOUS
  Filled 2018-01-16: qty 80

## 2018-01-16 MED ORDER — ONDANSETRON HCL 4 MG PO TABS: 4.0000 mg | ORAL_TABLET | Freq: Four times a day (QID) | ORAL | Status: DC | PRN

## 2018-01-16 MED ORDER — AMIODARONE HCL 200 MG PO TABS
200.0000 mg | ORAL_TABLET | Freq: Every day | ORAL | Status: DC
  Administered 2018-01-16 – 2018-01-19 (×4): 200 mg via ORAL
  Filled 2018-01-16 (×4): qty 1

## 2018-01-16 MED ORDER — TAMSULOSIN HCL 0.4 MG PO CAPS
0.4000 mg | ORAL_CAPSULE | Freq: Every day | ORAL | Status: DC
  Administered 2018-01-17 – 2018-01-19 (×3): 0.4 mg via ORAL
  Filled 2018-01-16 (×3): qty 1

## 2018-01-16 MED ORDER — ACETAMINOPHEN 650 MG RE SUPP: 650.0000 mg | Freq: Four times a day (QID) | RECTAL | Status: DC | PRN

## 2018-01-16 MED ORDER — ALBUTEROL SULFATE (2.5 MG/3ML) 0.083% IN NEBU: 2.5000 mg | INHALATION_SOLUTION | RESPIRATORY_TRACT | Status: DC | PRN

## 2018-01-16 MED ORDER — SODIUM CHLORIDE 0.9% FLUSH
3.0000 mL | Freq: Two times a day (BID) | INTRAVENOUS | Status: DC
  Administered 2018-01-17 – 2018-01-19 (×4): 3 mL via INTRAVENOUS

## 2018-01-16 MED ORDER — ONDANSETRON HCL 4 MG/2ML IJ SOLN: 4.0000 mg | Freq: Four times a day (QID) | INTRAMUSCULAR | Status: DC | PRN

## 2018-01-16 MED ORDER — METOPROLOL SUCCINATE ER 50 MG PO TB24
50.0000 mg | ORAL_TABLET | Freq: Every day | ORAL | Status: DC
  Administered 2018-01-17 – 2018-01-19 (×3): 50 mg via ORAL
  Filled 2018-01-16 (×3): qty 1

## 2018-01-16 MED ORDER — HYPROMELLOSE (GONIOSCOPIC) 2.5 % OP SOLN
1.0000 [drp] | Freq: Every morning | OPHTHALMIC | Status: DC
  Filled 2018-01-16: qty 15

## 2018-01-16 NOTE — ED Notes (Signed)
Patient arrived from triage with complaints of rectal bleeding. Patient resting on stretcher with family at bedside. Awaiting provider rounds.

## 2018-01-16 NOTE — Patient Outreach (Signed)
[name] [facility]) Care Management  01/16/2018  EBAN WEICK 1933/03/03 [id number]   EMMI- General Discharge RED ON EMMI ALERT Day # 1 Date:  01/13/18 Red Alert Reason: Unfilled prescriptions? Yes   Outreach attempt # 1 Spoke with patient.  He is able to verify HIPAA.  Patient reports that he is starting to have blood in his stools again and it is progressively getting worse.  He states he is going back to the hospital.  Strongly urged patient to get to the hospital before bleeding gets worse.  He verbalized understanding. Patient has all his medications.    Plan: RN CM will close case and check Epic to later to make sure patient went to the hospital as discussed.    Jone Baseman, RN, MSN [facility] Care Management Care Management Coordinator Direct Line [phone] Toll Free: [phone]  Fax: [phone]

## 2018-01-16 NOTE — H&P (Signed)
[place] [facility] [place] NAME: [name]    MR#:  [id number]  DATE OF BIRTH:  07-07-1933  DATE OF ADMISSION:  01/16/2018  PRIMARY CARE PHYSICIAN: Tonia Ghent, MD   REQUESTING/REFERRING PHYSICIAN: Dr. Clearnce Hasten  CHIEF COMPLAINT:   Chief Complaint  Patient presents with  . Rectal Bleeding    HISTORY OF PRESENT ILLNESS:  [name]  is a 84 y.o. male with a known history of atrial fibrillation on Coumadin, chronic systolic CHF, chronic hyponatremia chronic hypertension, recent admission to the hospital for angiodysplasia of the stomach with acute blood loss anemia returns due to melena and bright red blood per rectum.  Patient had similar presentation during last admission.  He started taking Coumadin again since seeing his primary care physician.  Stop taking the Coumadin 2 days back since he noticed the blood.  Due to no improvement in symptoms he has presented to the emergency room.  Patient had hemoglobin at 12 when he left the hospital and today it is a 9.2.  PAST MEDICAL HISTORY:   Past Medical History:  Diagnosis Date  . Atrial fibrillation ([place])   . Blind right eye   . CHF (congestive heart failure) ([place])   . Coronary atherosclerosis of unspecified type of vessel, native or graft 2008   cardioversion. Echo mod LVH EF 25%, [place] AR  Mild LAE, RAE 11/15/*11  . Diaphragmatic hernia without mention of obstruction or gangrene   . Diverticulosis of colon (without mention of hemorrhage)   . Dizziness and giddiness   . Encounter for long-term (current) use of other medications   . Encounter for therapeutic drug monitoring   . Esophageal reflux   . Gastritis 12/18/08   EGD, 3 clips remaining (Dr. Vira Agar)  . Hypertension   . Iron deficiency anemia, unspecified   . Left heart failure ([place])   . Long term (current) use of anticoagulants   . Macular degeneration   . Personal history of other diseases of digestive system   .  Personal history of peptic ulcer disease   . Pure hypercholesterolemia   . Sprain of neck   . Unspecified hemorrhoids without mention of complication   . Unspecified sleep apnea   . Upper GI bleed 7/15-7/20/09   Hosp.  Elevated INR, coumadin stopped sinusitis    PAST SURGICAL HISTORY:   Past Surgical History:  Procedure Laterality Date  . APPENDECTOMY    . CARDIAC CATHETERIZATION  1996   [facility]  . CARDIAC CATHETERIZATION    . CARDIAC CATHETERIZATION    . CARDIOVERSION  12/17/2011   Procedure: CARDIOVERSION;  Surgeon: Hillary Bow, MD;  Location: [place];  Service: Cardiovascular;  Laterality: N/A;  . CATARACT EXTRACTION  10/13/08   left  . CHOLECYSTECTOMY    . ESOPHAGOGASTRODUODENOSCOPY (EGD) WITH PROPOFOL N/A 01/04/2018   Procedure: ESOPHAGOGASTRODUODENOSCOPY (EGD) WITH PROPOFOL;  Surgeon: [name], MD;  Location: [facility] ENDOSCOPY;  Service: Endoscopy;  Laterality: N/A;  . MITRAL VALVE ANNULOPLASTY  1996  . MITRAL VALVE REPAIR  1995  . PARTIAL GASTRECTOMY      SOCIAL HISTORY:   Social History   Tobacco Use  . Smoking status: Never Smoker  . Smokeless tobacco: Never Used  Substance Use Topics  . Alcohol use: Yes    Comment: socially, beer     FAMILY HISTORY:   Family History  Problem Relation Age of Onset  . Cancer Father        lung  . Colon  cancer Neg Hx   . Prostate cancer Neg Hx     DRUG ALLERGIES:   Allergies  Allergen Reactions  . Atorvastatin Other (See Comments)    Other reaction(s): Unknown REACTION:muscular soreness  . Statins     Muscle aches on mult statins    REVIEW OF SYSTEMS:   Review of Systems  Constitutional: Positive for malaise/fatigue. Negative for chills, fever and weight loss.  HENT: Negative for hearing loss and nosebleeds.   Eyes: Negative for blurred vision, double vision and pain.  Respiratory: Negative for cough, hemoptysis, sputum production, shortness of breath and wheezing.   Cardiovascular: Negative for chest pain,  palpitations, orthopnea and leg swelling.  Gastrointestinal: Positive for blood in stool and melena. Negative for abdominal pain, constipation, diarrhea, nausea and vomiting.  Genitourinary: Negative for dysuria and hematuria.  Musculoskeletal: Negative for back pain, falls and myalgias.  Skin: Negative for rash.  Neurological: Positive for dizziness. Negative for tremors, sensory change, speech change, focal weakness, seizures and headaches.  Endo/Heme/Allergies: Does not bruise/bleed easily.  Psychiatric/Behavioral: Negative for depression and memory loss. The patient is not nervous/anxious.     MEDICATIONS AT HOME:   Prior to Admission medications   Medication Sig Start Date End Date Taking? Authorizing Provider  amiodarone (PACERONE) 200 MG tablet TAKE 1 TABLET BY MOUTH ONCE DAILY 11/08/17  Yes Wellington Hampshire, MD  cholecalciferol (VITAMIN D) 1000 UNITS tablet Take 1,000 Units by mouth daily.   Yes [provider]  cyanocobalamin 100 MCG tablet Take 100 mcg by mouth daily.     Yes [provider]  enalapril (VASOTEC) 5 MG tablet TAKE ONE TABLET BY MOUTH ONCE DAILY 11/17/17  Yes Wellington Hampshire, MD  eplerenone (INSPRA) 25 MG tablet TAKE 1 TABLET BY MOUTH ONCE DAILY 01/10/18  Yes Wellington Hampshire, MD  ferrous sulfate 325 (65 FE) MG tablet Take 325 mg by mouth 2 (two) times daily.   Yes [provider]  finasteride (PROSCAR) 5 MG tablet Take 1 tablet (5 mg total) by mouth daily. 11/03/17  Yes Tonia Ghent, MD  fish oil-omega-3 fatty acids 1000 MG capsule Take 1 g by mouth daily.   Yes [provider]  hydroxypropyl methylcellulose (ISOPTO TEARS) 2.5 % ophthalmic solution Place 1 drop into both eyes every morning.   Yes [provider]  metoprolol succinate (TOPROL-XL) 50 MG 24 hr tablet TAKE ONE TABLET BY MOUTH ONCE DAILY WITH MEALS 05/17/17  Yes Tonia Ghent, MD  Multiple Vitamin (MULTIVITAMIN) tablet Take 1 tablet by mouth daily.     Yes  [provider]  omeprazole (PRILOSEC) 20 MG capsule Take 1 capsule (20 mg total) by mouth daily. 06/24/15  Yes Minna Merritts, MD  tamsulosin (FLOMAX) 0.4 MG CAPS capsule Take 1 capsule (0.4 mg total) by mouth daily. 10/19/17  Yes Tonia Ghent, MD  torsemide (DEMADEX) 20 MG tablet Take 1 tablet (20 mg total) by mouth daily. 01/10/18 03/11/18 Yes Sainani, Belia Heman, MD  vitamin C (ASCORBIC ACID) 500 MG tablet Take 500 mg by mouth daily.   Yes [provider]  warfarin (COUMADIN) 1 MG tablet Take 1 tablet (1 mg total) by mouth one time only at 6 PM. Patient taking differently: Take 1 mg by mouth daily.  01/05/18   Fritzi Mandes, MD     VITAL SIGNS:  Blood pressure 122/61, pulse 70, temperature 97.9 F (36.6 C), temperature source Oral, resp. rate 16, height 5\' 11"  (1.803 m), weight 79.4 kg (  175 lb), SpO2 100 %.  PHYSICAL EXAMINATION:  Physical Exam  GENERAL:  84 y.o.-year-old patient lying in the bed with no acute distress.  EYES: Pupils equal, round, reactive to light and accommodation. No scleral icterus. Extraocular muscles intact.  HEENT: Head atraumatic, normocephalic. Oropharynx and nasopharynx clear. No oropharyngeal erythema, moist oral mucosa  NECK:  Supple, no jugular venous distention. No thyroid enlargement, no tenderness.  LUNGS: Normal breath sounds bilaterally, no wheezing, rales, rhonchi. No use of accessory muscles of respiration.  CARDIOVASCULAR: S1, S2 normal. No murmurs, rubs, or gallops.  ABDOMEN: Soft, nontender, nondistended. Bowel sounds present. No organomegaly or mass.  EXTREMITIES: No pedal edema, cyanosis, or clubbing. + 2 pedal & radial pulses b/l.   NEUROLOGIC: Cranial nerves II through XII are intact. No focal Motor or sensory deficits appreciated b/l PSYCHIATRIC: The patient is alert and oriented x 3. Good affect.  SKIN: No obvious rash, lesion, or ulcer.   LABORATORY PANEL:   CBC Recent Labs  Lab 01/16/18 1114  WBC 2.7*  HGB 9.2*  HCT  28.3*  PLT 212   ------------------------------------------------------------------------------------------------------------------  Chemistries  Recent Labs  Lab 01/16/18 1114  NA 131*  K 4.1  CL 97*  CO2 28  GLUCOSE 101*  BUN 17  CREATININE 0.72  CALCIUM 8.4*  AST 60*  ALT 49  ALKPHOS 58  BILITOT 0.9   ------------------------------------------------------------------------------------------------------------------  Cardiac Enzymes No results for input(s): TROPONINI in the last 168 hours. ------------------------------------------------------------------------------------------------------------------  RADIOLOGY:  No results found.   IMPRESSION AND PLAN:   *Recurrent GI bleed secondary to angiodysplasia of the stomach.  Associated acute blood loss anemia. Stop Coumadin.  Admit patient to medical floor with serial hemoglobin numbers.  Case discussed with GI.  N.p.o. except medications at this time. Start IV Protonix Transfuse as needed if hemoglobin less than 8 or symptomatic.  *Chronic systolic congestive heart failure.  No signs of fluid overload at this time. Will hold blood pressure medications due to low normal blood pressure and GI bleed.  *Chronic mild hyponatremia stable  *Paroxysmal atrial fibrillation.  Continue amiodarone and metoprolol.  Hold Coumadin.  *DVT prophylaxis with SCDs  All the records are reviewed and case discussed with ED provider. Management plans discussed with the patient, family and they are in agreement.  CODE STATUS: FULL CODE  TOTAL TIME TAKING CARE OF THIS PATIENT: 40 minutes.   Neita Carp M.D on 01/16/2018 at 2:05 PM  Between 7am to 6pm - Pager - [phone]  After 6pm go to [email or web address] - password [email or web address] [facility]  Office  [phone]  CC: Primary care physician; Tonia Ghent, MD  Note: This dictation was prepared with Dragon dictation along with smaller phrase technology. Any  transcriptional errors that result from this process are unintentional.

## 2018-01-16 NOTE — ED Notes (Signed)
Hospitalist to bedside at this time 

## 2018-01-16 NOTE — ED Provider Notes (Signed)
[facility] Emergency Department Provider Note  ____________________________________________   First MD Initiated Contact with Patient 01/16/18 1112     (approximate)  I have reviewed the triage vital signs and the nursing notes.   HISTORY  Chief Complaint Rectal Bleeding   HPI BOHDEN DUNG is a 84 y.o. male with atrial fibrillation on Coumadin who is presenting to the emergency department today with rectal bleeding.  He says that he has been passing clots.  Says that this has been an ongoing issue and had a recent endoscopy with angiodysplastic lesion status post argon treatment.  Also with recent admission to the hospital with discharge on April 9 without any acute bleeding while as an inpatient.  Family reports the patient has not been eating and drinking.  Mild generalized weakness.   Past Medical History:  Diagnosis Date  . Atrial fibrillation ([place])   . Blind right eye   . CHF (congestive heart failure) ([place])   . Coronary atherosclerosis of unspecified type of vessel, native or graft 2008   cardioversion. Echo mod LVH EF 25%, Triv AR  Mild LAE, RAE 11/15/*11  . Diaphragmatic hernia without mention of obstruction or gangrene   . Diverticulosis of colon (without mention of hemorrhage)   . Dizziness and giddiness   . Encounter for long-term (current) use of other medications   . Encounter for therapeutic drug monitoring   . Esophageal reflux   . Gastritis 12/18/08   EGD, 3 clips remaining (Dr. Vira Agar)  . Hypertension   . Iron deficiency anemia, unspecified   . Left heart failure ([place])   . Long term (current) use of anticoagulants   . Macular degeneration   . Personal history of other diseases of digestive system   . Personal history of peptic ulcer disease   . Pure hypercholesterolemia   . Sprain of neck   . Unspecified hemorrhoids without mention of complication   . Unspecified sleep apnea   . Upper GI bleed 7/15-7/20/09   Hosp.   Elevated INR, coumadin stopped sinusitis    Patient Active Problem List   Diagnosis Date Noted  . Hyponatremia 01/07/2018  . Melena   . GI bleed 01/02/2018  . Long term (current) use of anticoagulants 12/19/2017  . Rhinitis 11/04/2017  . Bilateral carotid bruits 11/17/2015  . H. pylori infection 10/26/2015  . Persistent atrial fibrillation ([place]) 04/28/2015  . Medicare annual wellness visit, subsequent 01/31/2015  . Advance care planning 01/31/2015  . Long term current use of anticoagulant therapy 01/30/2015  . Abnormal TSH 02/16/2014  . Chronic systolic heart failure ([place]) 08/16/2013  . Fatigue 09/08/2012  . Cardiomyopathy, dilated, nonischemic ([place]) 12/19/2011  . Hypertension 01/20/2011  . ROSACEA 11/12/2009  . BPH with obstruction/lower urinary tract symptoms 09/03/2009  . MITRAL VALVE REPLACEMENT, HX OF 06/11/2009  . OTHER AND UNSPECIFIED MITRAL VALVE DISEASES 03/04/2009  . PEPTIC ULCER DISEASE, HX OF 02/27/2009  . ANEMIA, IRON DEFICIENCY 04/09/2008  . GERD 04/09/2008  . HYPERCHOLESTEROLEMIA 04/08/2008  . LEFT VENTRICULAR FAILURE 04/08/2008  . HEMORRHOIDS 04/08/2008  . HIATAL HERNIA 04/08/2008  . DIVERTICULOSIS, COLON 04/08/2008  . SLEEP APNEA 04/08/2008  . GASTROINTESTINAL HEMORRHAGE, HX OF 04/08/2008  . Atrial fibrillation ([place]) 02/01/2007    Past Surgical History:  Procedure Laterality Date  . APPENDECTOMY    . CARDIAC CATHETERIZATION  1996   [facility]  . CARDIAC CATHETERIZATION    . CARDIAC CATHETERIZATION    . CARDIOVERSION  12/17/2011   Procedure: CARDIOVERSION;  Surgeon: Loretha Brasil  Lia Foyer, MD;  Location: [facility];  Service: Cardiovascular;  Laterality: N/A;  . CATARACT EXTRACTION  10/13/08   left  . CHOLECYSTECTOMY    . ESOPHAGOGASTRODUODENOSCOPY (EGD) WITH PROPOFOL N/A 01/04/2018   Procedure: ESOPHAGOGASTRODUODENOSCOPY (EGD) WITH PROPOFOL;  Surgeon: [name], MD;  Location[facility]COPY;  Service: Endoscopy;  Laterality: N/A;  . MITRAL VALVE ANNULOPLASTY  1996    . MITRAL VALVE REPAIR  1995  . PARTIAL GASTRECTOMY      Prior to Admission medications   Medication Sig Start Date End Date Taking? Authorizing Provider  amiodarone (PACERONE) 200 MG tablet TAKE 1 TABLET BY MOUTH ONCE DAILY 11/08/17   Wellington Hampshire, MD  cholecalciferol (VITAMIN D) 1000 UNITS tablet Take 1,000 Units by mouth daily.    [provider]  cyanocobalamin 100 MCG tablet Take 100 mcg by mouth daily.      [provider]  enalapril (VASOTEC) 5 MG tablet TAKE ONE TABLET BY MOUTH ONCE DAILY 11/17/17   Wellington Hampshire, MD  eplerenone (INSPRA) 25 MG tablet TAKE 1 TABLET BY MOUTH ONCE DAILY 01/10/18   Wellington Hampshire, MD  ferrous sulfate 325 (65 FE) MG tablet Take 325 mg by mouth 2 (two) times daily.    [provider]  finasteride (PROSCAR) 5 MG tablet Take 1 tablet (5 mg total) by mouth daily. 11/03/17   Tonia Ghent, MD  fish oil-omega-3 fatty acids 1000 MG capsule Take 1 g by mouth daily.    [provider]  hydroxypropyl methylcellulose (ISOPTO TEARS) 2.5 % ophthalmic solution Place 1 drop into both eyes every morning.    [provider]  metoprolol succinate (TOPROL-XL) 50 MG 24 hr tablet TAKE ONE TABLET BY MOUTH ONCE DAILY WITH MEALS 05/17/17   Tonia Ghent, MD  Multiple Vitamin (MULTIVITAMIN) tablet Take 1 tablet by mouth daily.      [provider]  omeprazole (PRILOSEC) 20 MG capsule Take 1 capsule (20 mg total) by mouth daily. 06/24/15   Minna Merritts, MD  tamsulosin (FLOMAX) 0.4 MG CAPS capsule Take 1 capsule (0.4 mg total) by mouth daily. 10/19/17   Tonia Ghent, MD  torsemide (DEMADEX) 20 MG tablet Take 1 tablet (20 mg total) by mouth daily. 01/10/18 03/11/18  Henreitta Leber, MD  vitamin C (ASCORBIC ACID) 500 MG tablet Take 500 mg by mouth daily.    [provider]  warfarin (COUMADIN) 1 MG tablet Take 1 tablet (1 mg total) by mouth one time only at 6 PM. Patient taking differently: Take 0.25 mg by  mouth one time only at 6 PM.  01/05/18   Fritzi Mandes, MD    Allergies Atorvastatin and Statins  Family History  Problem Relation Age of Onset  . Cancer Father        lung  . Colon cancer Neg Hx   . Prostate cancer Neg Hx     Social History Social History   Tobacco Use  . Smoking status: Never Smoker  . Smokeless tobacco: Never Used  Substance Use Topics  . Alcohol use: Yes    Comment: socially, beer   . Drug use: No    Review of Systems  Constitutional: No fever/chills Eyes: No visual changes. ENT: No sore throat. Cardiovascular: Denies chest pain. Respiratory: Denies shortness of breath. Gastrointestinal: No abdominal pain.  No nausea, no vomiting.  No diarrhea.  No constipation. Genitourinary: Negative for dysuria. Musculoskeletal: Negative for back pain. Skin: Negative for rash. Neurological: Negative for headaches,  focal weakness or numbness.   ____________________________________________   PHYSICAL EXAM:  VITAL SIGNS: ED Triage Vitals  Enc Vitals Group     BP 01/16/18 1103 95/75     Pulse Rate 01/16/18 1101 74     Resp 01/16/18 1101 16     Temp 01/16/18 1101 97.9 F (36.6 C)     Temp Source 01/16/18 1101 Oral     SpO2 01/16/18 1101 100 %     Weight 01/16/18 1102 175 lb (79.4 kg)     Height 01/16/18 1102 5\' 11"  (1.803 m)     Head Circumference --      Peak Flow --      Pain Score 01/16/18 1102 0     Pain Loc --      Pain Edu? --      Excl. in [place]? --     Constitutional: Alert and oriented. Well appearing and in no acute distress. Eyes: Conjunctivae are normal.  Head: Atraumatic. Nose: No congestion/rhinnorhea. Mouth/Throat: Mucous membranes are moist.  Neck: No stridor.   Cardiovascular: Normal rate, regular rhythm. Grossly normal heart sounds.   Respiratory: Normal respiratory effort.  No retractions. Lungs CTAB. Gastrointestinal: Soft and nontender. No distention. No CVA tenderness.  Rectal exam with rust colored stool which is strongly heme  positive. Musculoskeletal: No lower extremity tenderness nor edema.  No joint effusions. Neurologic:  Normal speech and language. No gross focal neurologic deficits are appreciated. Skin:  Skin is warm, dry and intact. No rash noted. Psychiatric: Mood and affect are normal. Speech and behavior are normal.  ____________________________________________   LABS (all labs ordered are listed, but only abnormal results are displayed)  Labs Reviewed  COMPREHENSIVE METABOLIC PANEL - Abnormal; Notable for the following components:      Result Value   Sodium 131 (*)    Chloride 97 (*)    Glucose, Bld 101 (*)    Calcium 8.4 (*)    Total Protein 6.4 (*)    Albumin 3.4 (*)    AST 60 (*)    All other components within normal limits  CBC - Abnormal; Notable for the following components:   WBC 2.7 (*)    RBC 3.47 (*)    Hemoglobin 9.2 (*)    HCT 28.3 (*)    RDW 16.7 (*)    All other components within normal limits  PROTIME-INR - Abnormal; Notable for the following components:   Prothrombin Time 15.9 (*)    All other components within normal limits  TYPE AND SCREEN  TYPE AND SCREEN   ____________________________________________  EKG  ____________________________________________  RADIOLOGY  ____________________________________________   PROCEDURES  Procedure(s) performed:   Procedures  Critical Care performed:   ____________________________________________   INITIAL IMPRESSION / ASSESSMENT AND PLAN / ED COURSE  Pertinent labs & imaging results that were available during my care of the patient were reviewed by me and considered in my medical decision making (see chart for details).  DDX: GI bleeding, diverticulosis, anemia, symptomatic anemia, Coumadin coagulopathy, renal failure, electrolyte abnormality/hyponatremia As part of my medical decision making, I reviewed the following data within the [facility] chart reviewed and Notes from prior ED  visits  ----------------------------------------- 1:27 PM on 01/16/2018 -----------------------------------------  I discussed the case with Dr. Bonna Gains of GI who recommends admission to the hospital.  Patient stable with GI bleeding at this time.  Likely upper given recent lesion on endoscopy.  Discussed case Dr. Darvin Neighbours.  Patient aware of need for admission  to the hospital and willing to comply.  Normal INR. ____________________________________________   FINAL CLINICAL IMPRESSION(S) / ED DIAGNOSES  GI bleeding.    NEW MEDICATIONS STARTED DURING THIS VISIT:  New Prescriptions   No medications on file     Note:  This document was prepared using Dragon voice recognition software and may include unintentional dictation errors.     Orbie Pyo, MD 01/16/18 1328

## 2018-01-16 NOTE — ED Triage Notes (Signed)
Says passing a lot of bright and dark blood since 2 nights ago.  Says recent procedure for same.

## 2018-01-16 NOTE — Consult Note (Signed)
Jeremy Antigua, MD [address], [place], [place], [place], [address] [address], [place], [place], [place], [address] Phone: [phone]  Fax: [phone]  Consultation  Referring Provider:     Dr. Darvin Neighbours Primary Care Physician:  Tonia Ghent, MD Reason for Consultation:     Hematochezia  Date of Admission:  01/16/2018 Date of Consultation:  01/16/2018         HPI:   [name] is a 84 y.o. male with history of partial gastrectomy and probable vagotomy 30-40 years ago because of peptic ulcer disease, Who presents with bright red blood for 2 days at home.  Has had 3-4 episodes of bright red blood per rectum.  Last episode was this morning.  No abdominal pain.  No nausea or vomiting.  No hematemesis.  Patient has history of A. fib and is on Coumadin, that he stopped taking 2 days ago due to the presence of blood per rectum.  Patient was recently in the hospital, and had an EGD on April 3, due to melena.  patient has history of Billroth II gastrojejunostomy, and was found to have an AVM bleeding in the gastric body that was treated with APC by Dr. Allen Norris.  In June 2009, patient had an EGD for upper GI bleed, and bleeding was noted from the anastomosis site, and clips were placed.  He had to repeat EGDs, 2 days later, and another 6 months later, and clips were seen in place, with no further bleeding.  No recent colonoscopies Previous colonoscopy reports none available.  Past Medical History:  Diagnosis Date  . Atrial fibrillation ([place])   . Blind right eye   . CHF (congestive heart failure) ([place])   . Coronary atherosclerosis of unspecified type of vessel, native or graft 2008   cardioversion. Echo mod LVH EF 25%, Triv AR  Mild LAE, RAE 11/15/*11  . Diaphragmatic hernia without mention of obstruction or gangrene   . Diverticulosis of colon (without mention of hemorrhage)   . Dizziness and giddiness   . Encounter for long-term (current) use of other medications   .  Encounter for therapeutic drug monitoring   . Esophageal reflux   . Gastritis 12/18/08   EGD, 3 clips remaining (Dr. Vira Agar)  . Hypertension   . Iron deficiency anemia, unspecified   . Left heart failure ([place])   . Long term (current) use of anticoagulants   . Macular degeneration   . Personal history of other diseases of digestive system   . Personal history of peptic ulcer disease   . Pure hypercholesterolemia   . Sprain of neck   . Unspecified hemorrhoids without mention of complication   . Unspecified sleep apnea   . Upper GI bleed 7/15-7/20/09   Hosp.  Elevated INR, coumadin stopped sinusitis    Past Surgical History:  Procedure Laterality Date  . APPENDECTOMY    . CARDIAC CATHETERIZATION  1996   [facility]  . CARDIAC CATHETERIZATION    . CARDIAC CATHETERIZATION    . CARDIOVERSION  12/17/2011   Procedure: CARDIOVERSION;  Surgeon: Hillary Bow, MD;  Location: [facility];  Service: Cardiovascular;  Laterality: N/A;  . CATARACT EXTRACTION  10/13/08   left  . CHOLECYSTECTOMY    . ESOPHAGOGASTRODUODENOSCOPY (EGD) WITH PROPOFOL N/A 01/04/2018   Procedure: ESOPHAGOGASTRODUODENOSCOPY (EGD) WITH PROPOFOL;  Surgeon: [name], MD;  Location: [facility] ENDOSCOPY;  Service: Endoscopy;  Laterality: N/A;  . MITRAL VALVE ANNULOPLASTY  1996  . MITRAL VALVE REPAIR  1995  .  PARTIAL GASTRECTOMY      Prior to Admission medications   Medication Sig Start Date End Date Taking? Authorizing Provider  amiodarone (PACERONE) 200 MG tablet TAKE 1 TABLET BY MOUTH ONCE DAILY 11/08/17  Yes Wellington Hampshire, MD  cholecalciferol (VITAMIN D) 1000 UNITS tablet Take 1,000 Units by mouth daily.   Yes [provider]  cyanocobalamin 100 MCG tablet Take 100 mcg by mouth daily.     Yes [provider]  enalapril (VASOTEC) 5 MG tablet TAKE ONE TABLET BY MOUTH ONCE DAILY 11/17/17  Yes Wellington Hampshire, MD  eplerenone (INSPRA) 25 MG tablet TAKE 1 TABLET BY MOUTH ONCE DAILY 01/10/18  Yes Wellington Hampshire, MD    ferrous sulfate 325 (65 FE) MG tablet Take 325 mg by mouth 2 (two) times daily.   Yes [provider]  finasteride (PROSCAR) 5 MG tablet Take 1 tablet (5 mg total) by mouth daily. 11/03/17  Yes Tonia Ghent, MD  fish oil-omega-3 fatty acids 1000 MG capsule Take 1 g by mouth daily.   Yes [provider]  hydroxypropyl methylcellulose (ISOPTO TEARS) 2.5 % ophthalmic solution Place 1 drop into both eyes every morning.   Yes [provider]  metoprolol succinate (TOPROL-XL) 50 MG 24 hr tablet TAKE ONE TABLET BY MOUTH ONCE DAILY WITH MEALS 05/17/17  Yes Tonia Ghent, MD  Multiple Vitamin (MULTIVITAMIN) tablet Take 1 tablet by mouth daily.     Yes [provider]  omeprazole (PRILOSEC) 20 MG capsule Take 1 capsule (20 mg total) by mouth daily. 06/24/15  Yes Minna Merritts, MD  tamsulosin (FLOMAX) 0.4 MG CAPS capsule Take 1 capsule (0.4 mg total) by mouth daily. 10/19/17  Yes Tonia Ghent, MD  torsemide (DEMADEX) 20 MG tablet Take 1 tablet (20 mg total) by mouth daily. 01/10/18 03/11/18 Yes Sainani, Belia Heman, MD  vitamin C (ASCORBIC ACID) 500 MG tablet Take 500 mg by mouth daily.   Yes [provider]  warfarin (COUMADIN) 1 MG tablet Take 1 tablet (1 mg total) by mouth one time only at 6 PM. Patient taking differently: Take 1 mg by mouth daily.  01/05/18   Fritzi Mandes, MD    Family History  Problem Relation Age of Onset  . Cancer Father        lung  . Colon cancer Neg Hx   . Prostate cancer Neg Hx      Social History   Tobacco Use  . Smoking status: Never Smoker  . Smokeless tobacco: Never Used  Substance Use Topics  . Alcohol use: Yes    Comment: socially, beer   . Drug use: No    Allergies as of 01/16/2018 - Review Complete 01/16/2018  Allergen Reaction Noted  . Atorvastatin Other (See Comments) 11/03/2006  . Statins  02/15/2014    Review of Systems:    All systems reviewed and negative except where noted in HPI.   Physical Exam:   Vital signs in last 24 hours: Vitals:   01/16/18 1430 01/16/18 1504 01/16/18 1530 01/16/18 1630  BP: (!) 131/106 133/68 128/67 139/69  Pulse:  73 71 73  Resp: (!) 23 15 16 14   Temp:      TempSrc:      SpO2:    100%  Weight:      Height:         General:   Pleasant, cooperative in NAD Head:  Normocephalic and atraumatic. Eyes:   No icterus.   Conjunctiva  pink. PERRLA. Ears:  Normal auditory acuity. Neck:  Supple; no masses or thyroidomegaly Lungs: Respirations even and unlabored. Lungs clear to auscultation bilaterally.   No wheezes, crackles, or rhonchi.  Abdomen:  Soft, nondistended, nontender. Normal bowel sounds. No appreciable masses or hepatomegaly.  No rebound or guarding.  Neurologic:  Alert and oriented x3;  grossly normal neurologically. Skin:  Intact without significant lesions or rashes. Cervical Nodes:  No significant cervical adenopathy. Psych:  Alert and cooperative. Normal affect.  LAB RESULTS: Recent Labs    01/16/18 1114  WBC 2.7*  HGB 9.2*  HCT 28.3*  PLT 212   BMET Recent Labs    01/16/18 1114  NA 131*  K 4.1  CL 97*  CO2 28  GLUCOSE 101*  BUN 17  CREATININE 0.72  CALCIUM 8.4*   LFT Recent Labs    01/16/18 1114  PROT 6.4*  ALBUMIN 3.4*  AST 60*  ALT 49  ALKPHOS 58  BILITOT 0.9   PT/INR Recent Labs    01/16/18 1114  LABPROT 15.9*  INR 1.28    STUDIES: No results found.    Impression / Plan:   TY BUNTROCK is a 84 y.o. y/o male with hematochezia at home, with recent EGD on April 3, showing a bleeding AVM in the gastric body that was treated with no recent colonoscopies, and patient requiring Coumadin at home for A. fib  Given ongoing hematochezia, despite recent EGD, lower GI bleed is also in the differential His hemoglobin is 9.2 and he is hemodynamic stable.  This all goes along with possibly lower GI bleed Colonoscopies as indicated, especially since he has not had one in years, and may have an underlying  malignancy Benefits of procedure would outweigh risks at this time We will proceed with both EGD and colonoscopy tomorrow Clear liquid diet today Colonoscopy prep today PPI IV twice daily Continue serial CBCs and transfuse. Avoid NSAIDs  I have discussed alternative options, risks & benefits,  which include, but are not limited to, bleeding, infection, perforation,respiratory complication & drug reaction.  The patient agrees with this plan & written consent will be obtained.     Thank you for involving me in the care of this patient.      LOS: 0 days   Virgel Manifold, MD  01/16/2018, 4:49 PM

## 2018-01-17 ENCOUNTER — Encounter: Payer: Self-pay | Admitting: *Deleted

## 2018-01-17 ENCOUNTER — Inpatient Hospital Stay: Payer: Medicare Other | Admitting: Anesthesiology

## 2018-01-17 ENCOUNTER — Encounter
Admission: EM | Disposition: A | Discharge status: Home Health | Payer: Self-pay | Source: Home / Self Care | Attending: Family Medicine

## 2018-01-17 DIAGNOSIS — K250 Acute gastric ulcer with hemorrhage: Secondary | ICD-10-CM

## 2018-01-17 DIAGNOSIS — K3189 Other diseases of stomach and duodenum: Secondary | ICD-10-CM

## 2018-01-17 HISTORY — PX: ESOPHAGOGASTRODUODENOSCOPY: SHX5428

## 2018-01-17 LAB — CBC
HCT: 24.3 % — ABNORMAL LOW (ref 40.0–52.0)
Hemoglobin: 8 g/dL — ABNORMAL LOW (ref 13.0–18.0)
MCH: 26.7 pg (ref 26.0–34.0)
MCHC: 32.8 g/dL (ref 32.0–36.0)
MCV: 81.4 fL (ref 80.0–100.0)
PLATELETS: 164 10*3/uL (ref 150–440)
RBC: 2.98 MIL/uL — ABNORMAL LOW (ref 4.40–5.90)
RDW: 16.7 % — AB (ref 11.5–14.5)
WBC: 3.1 10*3/uL — AB (ref 3.8–10.6)

## 2018-01-17 LAB — BASIC METABOLIC PANEL
Anion gap: 3 — ABNORMAL LOW (ref 5–15)
BUN: 14 mg/dL (ref 6–20)
CALCIUM: 7.9 mg/dL — AB (ref 8.9–10.3)
CO2: 25 mmol/L (ref 22–32)
CREATININE: 0.63 mg/dL (ref 0.61–1.24)
Chloride: 100 mmol/L — ABNORMAL LOW (ref 101–111)
Glucose, Bld: 83 mg/dL (ref 65–99)
Potassium: 4.2 mmol/L (ref 3.5–5.1)
SODIUM: 128 mmol/L — AB (ref 135–145)

## 2018-01-17 LAB — HEMOGLOBIN AND HEMATOCRIT, BLOOD
HCT: 26.4 % — ABNORMAL LOW (ref 40.0–52.0)
HEMATOCRIT: 24.5 % — AB (ref 40.0–52.0)
HEMOGLOBIN: 8 g/dL — AB (ref 13.0–18.0)
Hemoglobin: 8.6 g/dL — ABNORMAL LOW (ref 13.0–18.0)

## 2018-01-17 SURGERY — EGD (ESOPHAGOGASTRODUODENOSCOPY)
Anesthesia: General | Laterality: Left

## 2018-01-17 MED ORDER — SODIUM CHLORIDE 0.9 % IV SOLN: INTRAVENOUS | Status: DC

## 2018-01-17 MED ORDER — LIDOCAINE HCL (PF) 2 % IJ SOLN
INTRAMUSCULAR | Status: AC
  Filled 2018-01-17: qty 10

## 2018-01-17 MED ORDER — LIDOCAINE HCL (CARDIAC) 20 MG/ML IV SOLN
INTRAVENOUS | Status: DC | PRN
  Administered 2018-01-17: 40 mg via INTRAVENOUS

## 2018-01-17 MED ORDER — PHENYLEPHRINE HCL 10 MG/ML IJ SOLN
INTRAMUSCULAR | Status: DC | PRN
  Administered 2018-01-17 (×2): 100 ug via INTRAVENOUS
  Administered 2018-01-17 (×2): 200 ug via INTRAVENOUS
  Administered 2018-01-17 (×2): 100 ug via INTRAVENOUS

## 2018-01-17 MED ORDER — SODIUM CHLORIDE 1 G PO TABS
1.0000 g | ORAL_TABLET | Freq: Three times a day (TID) | ORAL | Status: DC
  Filled 2018-01-17 (×4): qty 1

## 2018-01-17 MED ORDER — GLYCOPYRROLATE 0.2 MG/ML IJ SOLN
INTRAMUSCULAR | Status: DC | PRN
  Administered 2018-01-17: 0.2 mg via INTRAVENOUS

## 2018-01-17 MED ORDER — EPHEDRINE SULFATE 50 MG/ML IJ SOLN
INTRAMUSCULAR | Status: DC | PRN
  Administered 2018-01-17 (×2): 5 mg via INTRAVENOUS

## 2018-01-17 MED ORDER — PROPOFOL 10 MG/ML IV BOLUS
INTRAVENOUS | Status: DC | PRN
  Administered 2018-01-17: 60 mg via INTRAVENOUS

## 2018-01-17 MED ORDER — PROPOFOL 500 MG/50ML IV EMUL
INTRAVENOUS | Status: AC
  Filled 2018-01-17: qty 50

## 2018-01-17 MED ORDER — GLYCOPYRROLATE 0.2 MG/ML IJ SOLN
INTRAMUSCULAR | Status: AC
  Filled 2018-01-17: qty 1

## 2018-01-17 MED ORDER — PROPOFOL 500 MG/50ML IV EMUL
INTRAVENOUS | Status: DC | PRN
  Administered 2018-01-17: 140 ug/kg/min via INTRAVENOUS

## 2018-01-17 NOTE — Progress Notes (Signed)
[facility] [place] NAME: [name]    MR#:  [id number]  DATE OF BIRTH:  02/28/1933  SUBJECTIVE:  CHIEF COMPLAINT:   Chief Complaint  Patient presents with  . Rectal Bleeding  Patient without complaint, most likely for EGD later today with possible colonoscopy, bowel prep did not start early this morning so colonoscopy may not be done until tomorrow, gastroneurology input appreciated  REVIEW OF SYSTEMS:  CONSTITUTIONAL: No fever, fatigue or weakness.  EYES: No blurred or double vision.  EARS, NOSE, AND THROAT: No tinnitus or ear pain.  RESPIRATORY: No cough, shortness of breath, wheezing or hemoptysis.  CARDIOVASCULAR: No chest pain, orthopnea, edema.  GASTROINTESTINAL: No nausea, vomiting, diarrhea or abdominal pain.  GENITOURINARY: No dysuria, hematuria.  ENDOCRINE: No polyuria, nocturia,  HEMATOLOGY: No anemia, easy bruising or bleeding SKIN: No rash or lesion. MUSCULOSKELETAL: No joint pain or arthritis.   NEUROLOGIC: No tingling, numbness, weakness.  PSYCHIATRY: No anxiety or depression.   ROS  DRUG ALLERGIES:   Allergies  Allergen Reactions  . Atorvastatin Other (See Comments)    Other reaction(s): Unknown REACTION:muscular soreness  . Statins     Muscle aches on mult statins    VITALS:  Blood pressure 132/65, pulse 77, temperature 97.9 F (36.6 C), temperature source Oral, resp. rate 19, height 5\' 11"  (1.803 m), weight 72.8 kg (160 lb 9.6 oz), SpO2 100 %.  PHYSICAL EXAMINATION:  GENERAL:  84 y.o.-year-old patient lying in the bed with no acute distress.  EYES: Pupils equal, round, reactive to light and accommodation. No scleral icterus. Extraocular muscles intact.  HEENT: Head atraumatic, normocephalic. Oropharynx and nasopharynx clear.  NECK:  Supple, no jugular venous distention. No thyroid enlargement, no tenderness.  LUNGS: Normal breath sounds bilaterally, no wheezing, rales,rhonchi or crepitation. No use  of accessory muscles of respiration.  CARDIOVASCULAR: S1, S2 normal. No murmurs, rubs, or gallops.  ABDOMEN: Soft, nontender, nondistended. Bowel sounds present. No organomegaly or mass.  EXTREMITIES: No pedal edema, cyanosis, or clubbing.  NEUROLOGIC: Cranial nerves II through XII are intact. Muscle strength 5/5 in all extremities. Sensation intact. Gait not checked.  PSYCHIATRIC: The patient is alert and oriented x 3.  SKIN: No obvious rash, lesion, or ulcer.   Physical Exam LABORATORY PANEL:   CBC Recent Labs  Lab 01/17/18 0409  WBC 3.1*  HGB 8.0*  HCT 24.3*  PLT 164   ------------------------------------------------------------------------------------------------------------------  Chemistries  Recent Labs  Lab 01/16/18 1114 01/17/18 0409  NA 131* 128*  K 4.1 4.2  CL 97* 100*  CO2 28 25  GLUCOSE 101* 83  BUN 17 14  CREATININE 0.72 0.63  CALCIUM 8.4* 7.9*  AST 60*  --   ALT 49  --   ALKPHOS 58  --   BILITOT 0.9  --    ------------------------------------------------------------------------------------------------------------------  Cardiac Enzymes No results for input(s): TROPONINI in the last 168 hours. ------------------------------------------------------------------------------------------------------------------  RADIOLOGY:  No results found.  ASSESSMENT AND PLAN:  *Acute recurrent GI bleeding  Secondary to chronic AVMs/angiodysplasia of the stomach Coumadin discontinued, Protonix drip, H&H every 8 hours, CBC in the morning, transfuse as needed, for EGD/possible colonoscopy later today, and continue close medical monitoring  *Chronic systolic congestive heart failure without exacerbation Stable Continue current regiment  *Chronic mild hyponatremia Worsening noted Salt tabs 3 times daily BMP in the morning  *Paroxysmal atrial fibrillation, chronic Stable on amiodarone and metoprolol Coumadin discontinued given recurrent GI  bleeding  Disposition pending clinical course  All  the records are reviewed and case discussed with Care Management/Social Workerr. Management plans discussed with the patient, family and they are in agreement.  CODE STATUS: full  TOTAL TIME TAKING CARE OF THIS PATIENT: 35 minutes.     POSSIBLE D/C IN 1-3 DAYS, DEPENDING ON CLINICAL CONDITION.   Avel Peace Hisayo Delossantos M.D on 01/17/2018   Between 7am to 6pm - Pager - [phone]  After 6pm go to [email or web address] - password [email or web address] [facility]  Office  [phone]  CC: Primary care physician; Tonia Ghent, MD  Note: This dictation was prepared with Dragon dictation along with smaller phrase technology. Any transcriptional errors that result from this process are unintentional.

## 2018-01-17 NOTE — Transfer of Care (Signed)
Immediate Anesthesia Transfer of Care Note  Patient: [name]  Procedure(s) Performed: ESOPHAGOGASTRODUODENOSCOPY (EGD) (Left ) COLONOSCOPY (Left )  Patient Location: PACU  Anesthesia Type:General  Level of Consciousness: sedated  Airway & Oxygen Therapy: Patient Spontanous Breathing and Patient connected to nasal cannula oxygen  Post-op Assessment: Report given to RN and Post -op Vital signs reviewed and stable  Post vital signs: Reviewed and stable  Last Vitals:  Vitals Value Taken Time  BP 100/51 01/17/2018  5:29 PM  Temp 36.1 C 01/17/2018  5:29 PM  Pulse 88 01/17/2018  5:29 PM  Resp 23 01/17/2018  5:29 PM  SpO2 100 % 01/17/2018  5:29 PM  Vitals shown include unvalidated device data.  Last Pain:  Vitals:   01/17/18 1729  TempSrc: (P) Tympanic  PainSc:          Complications: No apparent anesthesia complications

## 2018-01-17 NOTE — Anesthesia Post-op Follow-up Note (Signed)
Anesthesia QCDR form completed.        

## 2018-01-17 NOTE — Anesthesia Postprocedure Evaluation (Signed)
Anesthesia Post Note  Patient: PHIL MICHELS  Procedure(s) Performed: ESOPHAGOGASTRODUODENOSCOPY (EGD) (Left ) COLONOSCOPY (Left )  Patient location during evaluation: PACU Anesthesia Type: General Level of consciousness: awake and alert and oriented Pain management: pain level controlled Vital Signs Assessment: post-procedure vital signs reviewed and stable Respiratory status: spontaneous breathing Cardiovascular status: blood pressure returned to baseline Anesthetic complications: no     Last Vitals:  Vitals:   01/17/18 1739 01/17/18 1749  BP: (!) 102/54 (!) 112/54  Pulse: 92 94  Resp: 18 (!) 31  Temp:    SpO2: 99% 100%    Last Pain:  Vitals:   01/17/18 1749  TempSrc:   PainSc: 0-No pain                 Cindel Daugherty

## 2018-01-17 NOTE — Anesthesia Preprocedure Evaluation (Signed)
Anesthesia Evaluation  Patient identified by MRN, date of birth, ID band Patient awake    Reviewed: Allergy & Precautions, NPO status , Patient's Chart, lab work & pertinent test results, reviewed documented beta blocker date and time   Airway Mallampati: III  TM Distance: >3 FB     Dental  (+) Chipped, Upper Dentures, Partial Lower   Pulmonary sleep apnea ,           Cardiovascular hypertension, Pt. on medications and Pt. on home beta blockers + CAD and +CHF  + dysrhythmias Atrial Fibrillation      Neuro/Psych  Neuromuscular disease    GI/Hepatic GERD  ,  Endo/Other    Renal/GU      Musculoskeletal   Abdominal   Peds  Hematology  (+) anemia ,   Anesthesia Other Findings MV replacement. EF 25. R eye blind. Hb 10.8. INR increased.  Reproductive/Obstetrics                             Anesthesia Physical  Anesthesia Plan  ASA: III  Anesthesia Plan: General   Post-op Pain Management:    Induction: Intravenous  PONV Risk Score and Plan:   Airway Management Planned: Nasal Cannula  Additional Equipment:   Intra-op Plan:   Post-operative Plan:   Informed Consent: I have reviewed the patients History and Physical, chart, labs and discussed the procedure including the risks, benefits and alternatives for the proposed anesthesia with the patient or authorized representative who has indicated his/her understanding and acceptance.     Plan Discussed with: CRNA  Anesthesia Plan Comments:         Anesthesia Quick Evaluation

## 2018-01-17 NOTE — Progress Notes (Signed)
Upon doing chart checks order was noticed that patient was to start bowel prep on 01-16-18 at around 17:00pm. Patient was in the ER at this time and was not admitted to [facility]  until 1950 pm. Orders did not carry over to nightshift work list. Patient did not start bowel prep until 6 am this morning. Patient has had 1 episode of dark colored stool during the night. Paged Dr. Chuck Hint and spoke with her. She verbalized to let patient finished prep until 9 am and then nothing by mouth. Terrial Rhodes

## 2018-01-17 NOTE — Op Note (Signed)
[facility] Gastroenterology Patient Name: [name] Procedure Date: 01/17/2018 5:03 PM MRN: [id number] Account #: [id number] Date of Birth: 1933-03-12 Admit Type: Inpatient Age: 84 Room: [facility] ENDO ROOM 3 Gender: Male Note Status: Finalized Procedure:            Upper GI endoscopy Indications:          Melena, Pt. has history of gastric bypass surgery Providers:            Hiep Ollis B. Bonna Gains MD, MD Medicines:            Monitored Anesthesia Care Complications:        No immediate complications. Procedure:            Pre-Anesthesia Assessment:                       - The risks and benefits of the procedure and the                        sedation options and risks were discussed with the                        patient. All questions were answered and informed                        consent was obtained.                       - Patient identification and proposed procedure were                        verified prior to the procedure.                       - ASA Grade Assessment: III - A patient with severe                        systemic disease.                       After obtaining informed consent, the endoscope was                        passed under direct vision. Throughout the procedure,                        the patient's blood pressure, pulse, and oxygen                        saturations were monitored continuously. The Endoscope                        was introduced through the mouth, and advanced to the                        jejunum. The upper GI endoscopy was accomplished with                        ease. The patient tolerated the procedure well. Findings:      The examined esophagus was normal.      One oozing superficial gastric ulcer with adherent clot was found in the  stomach. The lesion was 7 mm in largest dimension. Coagulation for       hemostasis using bipolar probe was successful. The blood clot was first       removed with  water.      Patchy mildly friable mucosa with contact bleeding was found in the       entire examined stomach. The gastric mucosa was quite friable and       bled/oozed when it was cleaned gently with water. The oozing stopped       spontaneously.      The examined jejunum was normal. Normal bypass anatomy. Impression:           - Normal esophagus.                       - Oozing gastric ulcer with adherent clot. Treated with                        bipolar cautery.                       - Friable gastric mucosa.                       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 Given                        his friable mucosa and oozing even with water cleaning                        during the procedure, the superficial ulcer seen today                        is likely from the recently treated AVM which is oozing                        as it heals (due to friable tissue).                       - Normal examined jejunum.                       - No specimens collected. Recommendation:       - Use Protonix (pantoprazole) 40 mg PO BID for 4 weeks.                       - Return to GI clinic in 4 weeks.                       - Continue Serial CBCs and transfuse PRN                       - Avoid NSAIDs except Aspirin if medically indicated                       - No indication for colonoscopy at this time as source                        of GI bleed was found during EGD.                       -  Continue present medications.                       - Return to primary care physician in 2 weeks.                       - The findings and recommendations were discussed with                        the patient.                       - The findings and recommendations were discussed with                        the patient's family. Procedure Code(s):    --- Professional ---                       [phone], Esophagogastroduodenoscopy, flexible, transoral;                        with control of bleeding,  any method Diagnosis Code(s):    --- Professional ---                       K25.4, Chronic or unspecified gastric ulcer with                        hemorrhage                       K31.89, Other diseases of stomach and duodenum                       K92.1, Melena (includes Hematochezia) CPT copyright 2017 American Medical Association. All rights reserved. The codes documented in this report are preliminary and upon coder review may  be revised to meet current compliance requirements.  Vonda Antigua, MD Margretta Sidle B. Bonna Gains MD, MD 01/17/2018 5:45:26 PM This report has been signed electronically. Number of Addenda: 0 Note Initiated On: 01/17/2018 5:03 PM Estimated Blood Loss: Estimated blood loss: none.      [facility]

## 2018-01-18 ENCOUNTER — Encounter: Payer: Self-pay | Admitting: Gastroenterology

## 2018-01-18 LAB — CBC WITH DIFFERENTIAL/PLATELET
BASOS PCT: 1 %
Basophils Absolute: 0 10*3/uL (ref 0–0.1)
EOS ABS: 0 10*3/uL (ref 0–0.7)
Eosinophils Relative: 1 %
HEMATOCRIT: 23.9 % — AB (ref 40.0–52.0)
HEMOGLOBIN: 7.9 g/dL — AB (ref 13.0–18.0)
LYMPHS ABS: 0.7 10*3/uL — AB (ref 1.0–3.6)
Lymphocytes Relative: 21 %
MCH: 26.8 pg (ref 26.0–34.0)
MCHC: 33.1 g/dL (ref 32.0–36.0)
MCV: 80.9 fL (ref 80.0–100.0)
MONOS PCT: 14 %
Monocytes Absolute: 0.5 10*3/uL (ref 0.2–1.0)
NEUTROS ABS: 2.1 10*3/uL (ref 1.4–6.5)
NEUTROS PCT: 63 %
Platelets: 180 10*3/uL (ref 150–440)
RBC: 2.96 MIL/uL — AB (ref 4.40–5.90)
RDW: 16.8 % — ABNORMAL HIGH (ref 11.5–14.5)
WBC: 3.4 10*3/uL — AB (ref 3.8–10.6)

## 2018-01-18 LAB — HEMOGLOBIN AND HEMATOCRIT, BLOOD
HCT: 26.7 % — ABNORMAL LOW (ref 40.0–52.0)
Hemoglobin: 8.9 g/dL — ABNORMAL LOW (ref 13.0–18.0)

## 2018-01-18 LAB — BASIC METABOLIC PANEL
Anion gap: 4 — ABNORMAL LOW (ref 5–15)
BUN: 7 mg/dL (ref 6–20)
CALCIUM: 7.7 mg/dL — AB (ref 8.9–10.3)
CO2: 27 mmol/L (ref 22–32)
CREATININE: 0.71 mg/dL (ref 0.61–1.24)
Chloride: 98 mmol/L — ABNORMAL LOW (ref 101–111)
GFR calc Af Amer: >60 mL/min (ref >60)
GLUCOSE: 110 mg/dL — AB (ref 65–99)
Potassium: 3.5 mmol/L (ref 3.5–5.1)
Sodium: 129 mmol/L — ABNORMAL LOW (ref 135–145)

## 2018-01-18 MED ORDER — SODIUM CHLORIDE 1 G PO TABS
2.0000 g | ORAL_TABLET | Freq: Three times a day (TID) | ORAL | Status: AC
  Administered 2018-01-18 (×3): 2 g via ORAL
  Filled 2018-01-18 (×3): qty 2

## 2018-01-18 NOTE — Progress Notes (Signed)
[place] at [place] NAME: [name]    MR#:  [id number]  DATE OF BIRTH:  September 03, 1933  SUBJECTIVE:  CHIEF COMPLAINT:   Chief Complaint  Patient presents with  . Rectal Bleeding  Patient feeling better, patient not feeling up to being discharged home today, no events overnight  REVIEW OF SYSTEMS:  CONSTITUTIONAL: No fever, fatigue or weakness.  EYES: No blurred or double vision.  EARS, NOSE, AND THROAT: No tinnitus or ear pain.  RESPIRATORY: No cough, shortness of breath, wheezing or hemoptysis.  CARDIOVASCULAR: No chest pain, orthopnea, edema.  GASTROINTESTINAL: No nausea, vomiting, diarrhea or abdominal pain.  GENITOURINARY: No dysuria, hematuria.  ENDOCRINE: No polyuria, nocturia,  HEMATOLOGY: No anemia, easy bruising or bleeding SKIN: No rash or lesion. MUSCULOSKELETAL: No joint pain or arthritis.   NEUROLOGIC: No tingling, numbness, weakness.  PSYCHIATRY: No anxiety or depression.   ROS  DRUG ALLERGIES:   Allergies  Allergen Reactions  . Atorvastatin Other (See Comments)    Other reaction(s): Unknown REACTION:muscular soreness  . Statins     Muscle aches on mult statins    VITALS:  Blood pressure 115/73, pulse 92, temperature 98.2 F (36.8 C), temperature source Oral, resp. rate 18, height 5\' 11"  (1.803 m), weight 72.9 kg (160 lb 11.5 oz), SpO2 100 %.  PHYSICAL EXAMINATION:  GENERAL:  84 y.o.-year-old patient lying in the bed with no acute distress.  EYES: Pupils equal, round, reactive to light and accommodation. No scleral icterus. Extraocular muscles intact.  HEENT: Head atraumatic, normocephalic. Oropharynx and nasopharynx clear.  NECK:  Supple, no jugular venous distention. No thyroid enlargement, no tenderness.  LUNGS: Normal breath sounds bilaterally, no wheezing, rales,rhonchi or crepitation. No use of accessory muscles of respiration.  CARDIOVASCULAR: S1, S2 normal. No murmurs, rubs, or gallops.  ABDOMEN:  Soft, nontender, nondistended. Bowel sounds present. No organomegaly or mass.  EXTREMITIES: No pedal edema, cyanosis, or clubbing.  NEUROLOGIC: Cranial nerves II through XII are intact. Muscle strength 5/5 in all extremities. Sensation intact. Gait not checked.  PSYCHIATRIC: The patient is alert and oriented x 3.  SKIN: No obvious rash, lesion, or ulcer.   Physical Exam LABORATORY PANEL:   CBC Recent Labs  Lab 01/18/18 0531  WBC 3.4*  HGB 7.9*  HCT 23.9*  PLT 180   ------------------------------------------------------------------------------------------------------------------  Chemistries  Recent Labs  Lab 01/16/18 1114  01/18/18 0531  NA 131*   < > 129*  K 4.1   < > 3.5  CL 97*   < > 98*  CO2 28   < > 27  GLUCOSE 101*   < > 110*  BUN 17   < > 7  CREATININE 0.72   < > 0.71  CALCIUM 8.4*   < > 7.7*  AST 60*  --   --   ALT 49  --   --   ALKPHOS 58  --   --   BILITOT 0.9  --   --    < > = values in this interval not displayed.   ------------------------------------------------------------------------------------------------------------------  Cardiac Enzymes No results for input(s): TROPONINI in the last 168 hours. ------------------------------------------------------------------------------------------------------------------  RADIOLOGY:  No results found.  ASSESSMENT AND PLAN:  *Acute recurrent GI bleeding  Resolved Secondary to bleeding gastric ulcer noted on EGD done by gastroenterology on January 17, 2018-status post cautery, was due to previously treated AVM 14 days prior  Coumadin discontinued, changed to p.o. Protonix twice daily-to be continued for 4-week course, CBC in  the morning, transfuse as needed, will follow up with gastroenterology in 4 weeks for reevaluation, ambulate with assistance, tentative discharge plan for tomorrow   *Chronic systolic congestive heart failure without exacerbation Stable Continue current regiment  *Chronic mild  hyponatremia improving Continue Salt tabs 3 times daily BMP in the morning  *Paroxysmal atrial fibrillation, chronic Stable on amiodarone and metoprolol Coumadin discontinued given acute recurrent GI bleeding  Disposition tentatively plan for on tomorrow  All the records are reviewed and case discussed with Care Management/Social Workerr. Management plans discussed with the patient, family and they are in agreement.  CODE STATUS: full  TOTAL TIME TAKING CARE OF THIS PATIENT: 45 minutes.     POSSIBLE D/C IN 1 DAYS, DEPENDING ON CLINICAL CONDITION.   Avel Peace Salary M.D on 01/18/2018   Between 7am to 6pm - Pager - [phone]  After 6pm go to [email or web address] - password [email or web address] [facility]  Office  [phone]  CC: Primary care physician; Tonia Ghent, MD  Note: This dictation was prepared with Dragon dictation along with smaller phrase technology. Any transcriptional errors that result from this process are unintentional.

## 2018-01-18 NOTE — Care Management (Addendum)
Patient readmitted for GI bleed.  S/p EGD.  Patient lives at home with wife.  PCP Damita Dunnings. Pharmacy [place].  Reported independent at baseline.  Open with [facility] for Rn and PT. RNCM following

## 2018-01-19 ENCOUNTER — Ambulatory Visit: Payer: Medicare Other

## 2018-01-19 MED ORDER — PANTOPRAZOLE SODIUM 40 MG PO TBEC: 40.0000 mg | DELAYED_RELEASE_TABLET | Freq: Two times a day (BID) | ORAL | 0 refills | Status: DC

## 2018-01-19 NOTE — Discharge Summary (Signed)
[place] at [place] NAME: [name]    MR#:  [id number]  DATE OF BIRTH:  09-06-1933  DATE OF ADMISSION:  01/16/2018 ADMITTING PHYSICIAN: Hillary Bow, MD  DATE OF DISCHARGE: No discharge date for patient encounter.  PRIMARY CARE PHYSICIAN: Tonia Ghent, MD    ADMISSION DIAGNOSIS:  Gastrointestinal hemorrhage, unspecified gastrointestinal hemorrhage type [K92.2]  DISCHARGE DIAGNOSIS:  Active Problems:   Blood in stool   Acute gastric ulcer with hemorrhage   Stomach irritation   SECONDARY DIAGNOSIS:   Past Medical History:  Diagnosis Date  . Atrial fibrillation ([place])   . Blind right eye   . CHF (congestive heart failure) ([place])   . Coronary atherosclerosis of unspecified type of vessel, native or graft 2008   cardioversion. Echo mod LVH EF 25%, [place] AR  Mild LAE, RAE 11/15/*11  . Diaphragmatic hernia without mention of obstruction or gangrene   . Diverticulosis of colon (without mention of hemorrhage)   . Dizziness and giddiness   . Encounter for long-term (current) use of other medications   . Encounter for therapeutic drug monitoring   . Esophageal reflux   . Gastritis 12/18/08   EGD, 3 clips remaining (Dr. Vira Agar)  . Hypertension   . Iron deficiency anemia, unspecified   . Left heart failure ([place])   . Long term (current) use of anticoagulants   . Macular degeneration   . Personal history of other diseases of digestive system   . Personal history of peptic ulcer disease   . Pure hypercholesterolemia   . Sprain of neck   . Unspecified hemorrhoids without mention of complication   . Unspecified sleep apnea   . Upper GI bleed 7/15-7/20/09   Hosp.  Elevated INR, coumadin stopped sinusitis    HOSPITAL COURSE:  *Acute recurrent GI bleeding  Resolved Secondary to bleeding gastric ulcer noted on EGD done by gastroenterology on January 17, 2018-status post cautery, was due to previously treated AVM 14 days  prior  Coumadin discontinued, changed to p.o. Protonix twice daily-to be continued for 4-week course, will follow up with gastroenterology in 4 weeks for reevaluation, and patient did well    *Chronic systolic congestive heart failurewithout exacerbation Stable Continue current regiment  *Chronic mild hyponatremia improving with Salt tabs 3 times daily while in house  *Paroxysmal atrial fibrillation, chronic Stable onamiodarone and metoprolol Coumadin discontinued given acute recurrent GI bleeding    DISCHARGE CONDITIONS:  On the day of discharge patient is afebrile, hemodynamic is stable, tolerating diet, ambulating, ready for discharge to home with home health services, follow-up as directed, for more specific details please see chart  CONSULTS OBTAINED:    DRUG ALLERGIES:   Allergies  Allergen Reactions  . Atorvastatin Other (See Comments)    Other reaction(s): Unknown REACTION:muscular soreness  . Statins     Muscle aches on mult statins    DISCHARGE MEDICATIONS:   Allergies as of 01/19/2018      Reactions   Atorvastatin Other (See Comments)   Other reaction(s): Unknown REACTION:muscular soreness   Statins    Muscle aches on mult statins      Medication List    STOP taking these medications   omeprazole 20 MG capsule Commonly known as:  PRILOSEC   warfarin 1 MG tablet Commonly known as:  COUMADIN     TAKE these medications   amiodarone 200 MG tablet Commonly known as:  PACERONE TAKE 1 TABLET BY MOUTH ONCE DAILY  cholecalciferol 1000 units tablet Commonly known as:  VITAMIN D Take 1,000 Units by mouth daily.   cyanocobalamin 100 MCG tablet Take 100 mcg by mouth daily.   enalapril 5 MG tablet Commonly known as:  VASOTEC TAKE ONE TABLET BY MOUTH ONCE DAILY   eplerenone 25 MG tablet Commonly known as:  INSPRA TAKE 1 TABLET BY MOUTH ONCE DAILY   ferrous sulfate 325 (65 FE) MG tablet Take 325 mg by mouth 2 (two) times daily.   finasteride  5 MG tablet Commonly known as:  PROSCAR Take 1 tablet (5 mg total) by mouth daily.   fish oil-omega-3 fatty acids 1000 MG capsule Take 1 g by mouth daily.   hydroxypropyl methylcellulose / hypromellose 2.5 % ophthalmic solution Commonly known as:  ISOPTO TEARS / GONIOVISC Place 1 drop into both eyes every morning.   metoprolol succinate 50 MG 24 hr tablet Commonly known as:  TOPROL-XL TAKE ONE TABLET BY MOUTH ONCE DAILY WITH MEALS   multivitamin tablet Take 1 tablet by mouth daily.   pantoprazole 40 MG tablet Commonly known as:  PROTONIX Take 1 tablet (40 mg total) by mouth 2 (two) times daily before a meal.   tamsulosin 0.4 MG Caps capsule Commonly known as:  FLOMAX Take 1 capsule (0.4 mg total) by mouth daily.   torsemide 20 MG tablet Commonly known as:  DEMADEX Take 1 tablet (20 mg total) by mouth daily.   vitamin C 500 MG tablet Commonly known as:  ASCORBIC ACID Take 500 mg by mouth daily.        DISCHARGE INSTRUCTIONS:  If you experience worsening of your admission symptoms, develop shortness of breath, life threatening emergency, suicidal or homicidal thoughts you must seek medical attention immediately by calling 911 or calling your MD immediately  if symptoms less severe.  You Must read complete instructions/literature along with all the possible adverse reactions/side effects for all the Medicines you take and that have been prescribed to you. Take any new Medicines after you have completely understood and accept all the possible adverse reactions/side effects.   Please note  You were cared for by a hospitalist during your hospital stay. If you have any questions about your discharge medications or the care you received while you were in the hospital after you are discharged, you can call the unit and asked to speak with the hospitalist on call if the hospitalist that took care of you is not available. Once you are discharged, your primary care physician will  handle any further medical issues. Please note that NO REFILLS for any discharge medications will be authorized once you are discharged, as it is imperative that you return to your primary care physician (or establish a relationship with a primary care physician if you do not have one) for your aftercare needs so that they can reassess your need for medications and monitor your lab values.    Today   CHIEF COMPLAINT:   Chief Complaint  Patient presents with  . Rectal Bleeding    HISTORY OF PRESENT ILLNESS:  84 y.o. male with a known history of atrial fibrillation on Coumadin, chronic systolic CHF, chronic hyponatremia chronic hypertension, recent admission to the hospital for angiodysplasia of the stomach with acute blood loss anemia returns due to melena and bright red blood per rectum.  Patient had similar presentation during last admission.  He started taking Coumadin again since seeing his primary care physician.  Stop taking the Coumadin 2 days back since he noticed the  blood.  Due to no improvement in symptoms he has presented to the emergency room.  Patient had hemoglobin at 12 when he left the hospital and today it is a 9.2.  VITAL SIGNS:  Blood pressure 105/68, pulse 72, temperature 97.9 F (36.6 C), temperature source Oral, resp. rate 18, height 5\' 11"  (1.803 m), weight 69.2 kg (152 lb 8 oz), SpO2 100 %.  I/O:    Intake/Output Summary (Last 24 hours) at 01/19/2018 1008 Last data filed at 01/19/2018 0615 Gross per 24 hour  Intake 1289 ml  Output 2775 ml  Net -1486 ml    PHYSICAL EXAMINATION:  GENERAL:  84 y.o.-year-old patient lying in the bed with no acute distress.  EYES: Pupils equal, round, reactive to light and accommodation. No scleral icterus. Extraocular muscles intact.  HEENT: Head atraumatic, normocephalic. Oropharynx and nasopharynx clear.  NECK:  Supple, no jugular venous distention. No thyroid enlargement, no tenderness.  LUNGS: Normal breath sounds  bilaterally, no wheezing, rales,rhonchi or crepitation. No use of accessory muscles of respiration.  CARDIOVASCULAR: S1, S2 normal. No murmurs, rubs, or gallops.  ABDOMEN: Soft, non-tender, non-distended. Bowel sounds present. No organomegaly or mass.  EXTREMITIES: No pedal edema, cyanosis, or clubbing.  NEUROLOGIC: Cranial nerves II through XII are intact. Muscle strength 5/5 in all extremities. Sensation intact. Gait not checked.  PSYCHIATRIC: The patient is alert and oriented x 3.  SKIN: No obvious rash, lesion, or ulcer.   DATA REVIEW:   CBC Recent Labs  Lab 01/18/18 0531 01/18/18 1335  WBC 3.4*  --   HGB 7.9* 8.9*  HCT 23.9* 26.7*  PLT 180  --     Chemistries  Recent Labs  Lab 01/16/18 1114  01/18/18 0531  NA 131*   < > 129*  K 4.1   < > 3.5  CL 97*   < > 98*  CO2 28   < > 27  GLUCOSE 101*   < > 110*  BUN 17   < > 7  CREATININE 0.72   < > 0.71  CALCIUM 8.4*   < > 7.7*  AST 60*  --   --   ALT 49  --   --   ALKPHOS 58  --   --   BILITOT 0.9  --   --    < > = values in this interval not displayed.    Cardiac Enzymes No results for input(s): TROPONINI in the last 168 hours.  Microbiology Results  No results found for this or any previous visit.  RADIOLOGY:  No results found.  EKG:   Orders placed or performed in visit on 11/19/16  . EKG 12-Lead      Management plans discussed with the patient, family and they are in agreement.  CODE STATUS:     Code Status Orders  (From admission, onward)        Start     Ordered   01/16/18 1400  Full code  Continuous     01/16/18 1400    Code Status History    Date Active Date Inactive Code Status Order ID Comments User Context   01/07/2018 1811 01/10/2018 1845 Full Code [id number]  Fritzi Mandes, MD Inpatient   01/02/2018 1732 01/05/2018 1515 Full Code [id number]  Vaughan Basta, MD ED    Advance Directive Documentation     Most Recent Value  Type of Advance Directive  Living will  Pre-existing out of  facility DNR order (yellow form or pink MOST form)  -  "  MOST" Form in Place?  -      TOTAL TIME TAKING CARE OF THIS PATIENT: 45 minutes.    Avel Peace Kierston Plasencia M.D on 01/19/2018 at 10:08 AM  Between 7am to 6pm - Pager - [phone]  After 6pm go to [email or web address] - password [email or web address] [facility]  Office  [phone]  CC: Primary care physician; Tonia Ghent, MD   Note: This dictation was prepared with Dragon dictation along with smaller phrase technology. Any transcriptional errors that result from this process are unintentional.

## 2018-01-19 NOTE — Care Management Note (Signed)
Case Management Note  Patient Details  Name: [name] MRN: [id number] Date of Birth: 16-Sep-1933   [facility] notified of discharge.  Resumption orders are in placed.  RNCM signing off   Subjective/Objective:                    Action/Plan:   Expected Discharge Date:  01/19/18               Expected Discharge Plan:  Center  In-House Referral:     Discharge planning Services  CM Consult  Post Acute Care Choice:  Resumption of Svcs/PTA Provider Choice offered to:     DME Arranged:    DME Agency:     HH Arranged:  RN, PT, Social Work [employer] Agency:  [place]  Status of Service:  Completed, signed off  If discussed at [employer], dates discussed:    Additional Comments:  Beverly Sessions, RN 01/19/2018, 10:38 AM

## 2018-01-23 ENCOUNTER — Telehealth: Payer: Self-pay | Admitting: *Deleted

## 2018-01-23 NOTE — Telephone Encounter (Signed)
Transition Care Management Follow-up Telephone Call   Date discharged? 01/19/2018   How have you been since you were released from the hospital? "not doing my best"   Do you understand why you were in the hospital? yes   Do you understand the discharge instructions? yes   Where were you discharged to? home   Items Reviewed:  Medications reviewed: yes pt states he was not given any meds when d/c from hospital  Allergies reviewed: yes  Dietary changes reviewed: n/a  Referrals reviewed: yes   Functional Questionnaire:   Activities of Daily Living (ADLs):   He states they are independent in the following: in all areas    Any transportation issues/concerns?: no   Any patient concerns? Yes; "need medicine to help with stomach" also advised pt to contact GI   Confirmed importance and date/time of follow-up visits scheduled yes  Provider Appointment booked with Damita Dunnings 4/29  Confirmed with patient if condition begins to worsen call PCP or go to the ER.  Patient was given the office number and encouraged to call back with question or concerns.  : yes

## 2018-01-24 ENCOUNTER — Inpatient Hospital Stay: Payer: Medicare Other | Admitting: Family Medicine

## 2018-01-24 ENCOUNTER — Telehealth: Payer: Self-pay | Admitting: Family Medicine

## 2018-01-24 NOTE — Telephone Encounter (Signed)
Copied from [place] [id number]. Topic: Quick Communication - See Telephone Encounter >> Jan 24, 2018  2:46 PM Aurelio Brash B wrote: CRM for notification. See Telephone encounter for: 01/24/18.  Butch Penny  from [facility]  states when pt was d/ced from hospital  he declined home health services     Butch Penny says she is off work for the day but any questions call the [facility] main number at -  [phone]

## 2018-01-24 NOTE — Telephone Encounter (Signed)
Noted. Thanks.

## 2018-01-25 NOTE — Telephone Encounter (Signed)
Noted that he declined [facility] service.  Thanks.

## 2018-01-27 ENCOUNTER — Telehealth: Payer: Self-pay | Admitting: Cardiovascular Disease

## 2018-01-27 NOTE — Telephone Encounter (Addendum)
Spoke with patients wife per release form and she reports that patient thinks his heart is not acting right again and he wants to come in to see his provider. She reports that he has been short of breath, chest pains, and just overall not feeling good. Reviewed signs and symptoms to monitor for that would require immediate attention in the ED and she verbalized understanding with no further questions.

## 2018-01-27 NOTE — Telephone Encounter (Signed)
Spoke with patients wife per release form and reviewed that we do not have any appointments available at this time and that I would have someone call first thing Monday to see if we have any cancellations or other appointments available. She states that he is extremely weak and that he really needs to be seen. He is scheduled to see his primary care provider on Monday and she was wanting him to come in and see his heart doctor on the same day. Advised that we currently do not have a opening and that I would have scheduling call her on Monday to see if we have anything open or if there is a cancellation. She verbalized understanding with no further questions at this time.

## 2018-01-27 NOTE — Telephone Encounter (Signed)
Pt c/o Syncope: STAT if syncope occurred within 30 minutes and pt complains of lightheadedness High Priority if episode of passing out, completely, today or in last 24 hours   1. Did you pass out today? No   2. When is the last time you passed out?  3 days ago   3. Has this occurred multiple times? X 3   4. Did you have any symptoms prior to passing out?  Felt dehydrated pain in heart breathing in and holding breath makes heart hurt

## 2018-01-30 ENCOUNTER — Ambulatory Visit: Payer: Medicare Other | Admitting: Cardiovascular Disease

## 2018-01-30 ENCOUNTER — Ambulatory Visit (INDEPENDENT_AMBULATORY_CARE_PROVIDER_SITE_OTHER): Payer: Medicare Other | Admitting: Family Medicine

## 2018-01-30 ENCOUNTER — Encounter: Payer: Self-pay | Admitting: Family Medicine

## 2018-01-30 VITALS — BP 90/52 | HR 75 | Temp 97.9°F | Ht 71.0 in | Wt 160.5 lb

## 2018-01-30 DIAGNOSIS — K250 Acute gastric ulcer with hemorrhage: Secondary | ICD-10-CM | POA: Diagnosis not present

## 2018-01-30 DIAGNOSIS — K922 Gastrointestinal hemorrhage, unspecified: Secondary | ICD-10-CM | POA: Diagnosis not present

## 2018-01-30 DIAGNOSIS — I10 Essential (primary) hypertension: Secondary | ICD-10-CM | POA: Diagnosis not present

## 2018-01-30 LAB — COMPREHENSIVE METABOLIC PANEL
ALT: 55 U/L — ABNORMAL HIGH (ref 0–53)
AST: 52 U/L — ABNORMAL HIGH (ref 0–37)
Albumin: 3.3 g/dL — ABNORMAL LOW (ref 3.5–5.2)
Alkaline Phosphatase: 63 U/L (ref 39–117)
BILIRUBIN TOTAL: 0.6 mg/dL (ref 0.2–1.2)
BUN: 9 mg/dL (ref 6–23)
CHLORIDE: 97 meq/L (ref 96–112)
CO2: 31 meq/L (ref 19–32)
Calcium: 8.5 mg/dL (ref 8.4–10.5)
Creatinine, Ser: 0.78 mg/dL (ref 0.40–1.50)
GFR: 101.07 mL/min (ref >60.00)
GLUCOSE: 89 mg/dL (ref 70–99)
POTASSIUM: 3.6 meq/L (ref 3.5–5.1)
Sodium: 133 mEq/L — ABNORMAL LOW (ref 135–145)
Total Protein: 6.1 g/dL (ref 6.0–8.3)

## 2018-01-30 LAB — CBC WITH DIFFERENTIAL/PLATELET
BASOS ABS: 0 10*3/uL (ref 0.0–0.1)
BASOS PCT: 1.1 % (ref 0.0–3.0)
EOS PCT: 1 % (ref 0.0–5.0)
Eosinophils Absolute: 0 10*3/uL (ref 0.0–0.7)
Hemoglobin: 8.6 g/dL — ABNORMAL LOW (ref 13.0–17.0)
LYMPHS PCT: 27.4 % (ref 12.0–46.0)
Lymphs Abs: 0.9 10*3/uL (ref 0.7–4.0)
MCHC: 32.2 g/dL (ref 30.0–36.0)
MCV: 78.8 fl (ref 78.0–100.0)
MONOS PCT: 16.6 % — AB (ref 3.0–12.0)
Monocytes Absolute: 0.6 10*3/uL (ref 0.1–1.0)
NEUTROS ABS: 1.8 10*3/uL (ref 1.4–7.7)
Neutrophils Relative %: 53.9 % (ref 43.0–77.0)
PLATELETS: 262 10*3/uL (ref 150.0–400.0)
RBC: 3.38 Mil/uL — ABNORMAL LOW (ref 4.22–5.81)
RDW: 16.3 % — ABNORMAL HIGH (ref 11.5–15.5)
WBC: 3.4 10*3/uL — ABNORMAL LOW (ref 4.0–10.5)

## 2018-01-30 LAB — IBC PANEL
Iron: 10 ug/dL — ABNORMAL LOW (ref 42–165)
SATURATION RATIOS: 2.9 % — AB (ref 20.0–50.0)
Transferrin: 249 mg/dL (ref 212.0–360.0)

## 2018-01-30 MED ORDER — TORSEMIDE 20 MG PO TABS: 20.0000 mg | ORAL_TABLET | Freq: Every day | ORAL | Status: DC | PRN

## 2018-01-30 MED ORDER — PANTOPRAZOLE SODIUM 40 MG PO TBEC
40.0000 mg | DELAYED_RELEASE_TABLET | Freq: Two times a day (BID) | ORAL | 1 refills | Status: DC
Start: 2018-01-30 — End: 2018-04-05

## 2018-01-30 NOTE — Patient Instructions (Addendum)
Keep the appointment with GI.  Keep taking protonix twice a day.  Stop the enalapril for now.  See if that helps with being lightheaded.  If any abdominal pain or blood in stool, then call the GI clinic.  Go to the lab on the way out.  We'll contact you with your lab report. Take care.  Glad to see you.

## 2018-01-30 NOTE — Telephone Encounter (Signed)
Pt is scheduled for Dr Fletcher Anon today

## 2018-01-30 NOTE — Progress Notes (Signed)
ME: [name]    MR#:  [id number]  DATE OF BIRTH:  03/04/1933  DATE OF ADMISSION:  01/16/2018    ADMITTING PHYSICIAN: Hillary Bow, MD  DATE OF DISCHARGE: 01/19/18  PRIMARY CARE PHYSICIAN: Tonia Ghent, MD    ADMISSION DIAGNOSIS:  Gastrointestinal hemorrhage, unspecified gastrointestinal hemorrhage type [K92.2]  DISCHARGE DIAGNOSIS:  Active Problems:   Blood in stool   Acute gastric ulcer with hemorrhage   Stomach irritation   SECONDARY DIAGNOSIS:       Past Medical History:  Diagnosis Date  . Atrial fibrillation ([place])   . Blind right eye   . CHF (congestive heart failure) ([place])   . Coronary atherosclerosis of unspecified type of vessel, native or graft 2008   cardioversion. Echo mod LVH EF 25%, [place] AR  Mild LAE, RAE 11/15/*11  . Diaphragmatic hernia without mention of obstruction or gangrene   . Diverticulosis of colon (without mention of hemorrhage)   . Dizziness and giddiness   . Encounter for long-term (current) use of other medications   . Encounter for therapeutic drug monitoring   . Esophageal reflux   . Gastritis 12/18/08   EGD, 3 clips remaining (Dr. Vira Agar)  . Hypertension   . Iron deficiency anemia, unspecified   . Left heart failure ([place])   . Long term (current) use of anticoagulants   . Macular degeneration   . Personal history of other diseases of digestive system   . Personal history of peptic ulcer disease   . Pure hypercholesterolemia   . Sprain of neck   . Unspecified hemorrhoids without mention of complication   . Unspecified sleep apnea   . Upper GI bleed 7/15-7/20/09   Hosp.  Elevated INR, coumadin stopped sinusitis    HOSPITAL COURSE:  *Acute recurrent GI bleeding Resolved Secondary tobleeding gastric ulcer noted on EGD done by gastroenterology on January 17, 2018-status post cautery, was due to previously treated AVM 14 days prior Coumadin discontinued,changed to p.o. Protonix twice  daily-to be continued for 4-week course, will follow up with gastroenterology in 4 weeks for reevaluation, and patient did well   *Chronic systolic congestive heart failurewithout exacerbation Stable Continue current regiment  *Chronic mild hyponatremia improving withSalt tabs 3 times daily while in house  *Paroxysmal atrial fibrillation, chronic Stable onamiodarone and metoprolol Coumadin discontinued givenacuterecurrent GI bleeding    DISCHARGE CONDITIONS:  On the day of discharge patient is afebrile, hemodynamic is stable, tolerating diet, ambulating, ready for discharge to home with home health services, follow-up as directed, for more specific details please see chart  CONSULTS OBTAINED:   DRUG ALLERGIES:        Allergies  Allergen Reactions  . Atorvastatin Other (See Comments)    Other reaction(s): Unknown REACTION:muscular soreness  . Statins     Muscle aches on mult statins    DISCHARGE MEDICATIONS:        Allergies as of 01/19/2018      Reactions   Atorvastatin Other (See Comments)   Other reaction(s): Unknown REACTION:muscular soreness   Statins    Muscle aches on mult statins         Medication List    STOP taking these medications   omeprazole 20 MG capsule Commonly known as:  PRILOSEC   warfarin 1 MG tablet Commonly known as:  COUMADIN     TAKE these medications   amiodarone 200 MG tablet Commonly known as:  PACERONE TAKE 1 TABLET BY MOUTH ONCE DAILY   cholecalciferol 1000 units tablet Commonly known  as:  VITAMIN D Take 1,000 Units by mouth daily.   cyanocobalamin 100 MCG tablet Take 100 mcg by mouth daily.   enalapril 5 MG tablet Commonly known as:  VASOTEC TAKE ONE TABLET BY MOUTH ONCE DAILY   eplerenone 25 MG tablet Commonly known as:  INSPRA TAKE 1 TABLET BY MOUTH ONCE DAILY   ferrous sulfate 325 (65 FE) MG tablet Take 325 mg by mouth 2 (two) times daily.   finasteride 5 MG  tablet Commonly known as:  PROSCAR Take 1 tablet (5 mg total) by mouth daily.   fish oil-omega-3 fatty acids 1000 MG capsule Take 1 g by mouth daily.   hydroxypropyl methylcellulose / hypromellose 2.5 % ophthalmic solution Commonly known as:  ISOPTO TEARS / GONIOVISC Place 1 drop into both eyes every morning.   metoprolol succinate 50 MG 24 hr tablet Commonly known as:  TOPROL-XL TAKE ONE TABLET BY MOUTH ONCE DAILY WITH MEALS   multivitamin tablet Take 1 tablet by mouth daily.   pantoprazole 40 MG tablet Commonly known as:  PROTONIX Take 1 tablet (40 mg total) by mouth 2 (two) times daily before a meal.   tamsulosin 0.4 MG Caps capsule Commonly known as:  FLOMAX Take 1 capsule (0.4 mg total) by mouth daily.   torsemide 20 MG tablet Commonly known as:  DEMADEX Take 1 tablet (20 mg total) by mouth daily.   vitamin C 500 MG tablet Commonly known as:  ASCORBIC ACID Take 500 mg by mouth daily.        DISCHARGE INSTRUCTIONS:  If you experience worsening of your admission symptoms, develop shortness of breath, life threatening emergency, suicidal or homicidal thoughts you must seek medical attention immediately by calling 911 or calling your MD immediately  if symptoms less severe.  You Must read complete instructions/literature along with all the possible adverse reactions/side effects for all the Medicines you take and that have been prescribed to you. Take any new Medicines after you have completely understood and accept all the possible adverse reactions/side effects.   Please note  You were cared for by a hospitalist during your hospital stay. If you have any questions about your discharge medications or the care you received while you were in the hospital after you are discharged, you can call the unit and asked to speak with the hospitalist on call if the hospitalist that took care of you is not available. Once you are discharged, your primary care  physician will handle any further medical issues. Please note that NO REFILLS for any discharge medications will be authorized once you are discharged, as it is imperative that you return to your primary care physician (or establish a relationship with a primary care physician if you do not have one) for your aftercare needs so that they can reassess your need for medications and monitor your lab values.    Today   CHIEF COMPLAINT:      Chief Complaint  Patient presents with  . Rectal Bleeding    HISTORY OF PRESENT ILLNESS:  84 y.o.malewith a known history of atrial fibrillation on Coumadin, chronic systolic CHF, chronic hyponatremia chronic hypertension, recent admission to the hospital for angiodysplasia of the stomach with acute blood loss anemia returns due to melena and bright red blood per rectum. Patient had similar presentation during last admission. He started taking Coumadin again since seeing his primary care physician. Stop taking the Coumadin 2 days back since he noticed the blood. Due to no improvement in symptoms he  has presented to the emergency room. Patient had hemoglobin at 12 when he left the hospital and today it is a 9.2.  ==================================================== Inpatient course d/w pt.   Readmission with bleeding gastric ulcer noted on EGD done by gastroenterology on January 17, 2018-status post cautery, was due to previously treated AVM 14 days prior.   Coumadin discontinued,changed to p.o. Protonix twice daily-to be continued for 4-week course, will follow up with gastroenterology in 4 weeks for reevaluation.    He has f/u with GI pending.  Off coumadin currently.  No bleeding.  No black stool .  No blood in stool.  No vomiting.  No abd pain.  Eating okay.  No fevers.    Occ lightheaded.   Taking ACE, metoprolol.  Has not been taking torsemide recently.    He fell about 1 week ago, hit his L chest wall and was locally sore w/o bruising.   Mildly ttp locally not.  Using a cane to walk.  Routine cautions d/w pt.    PMH and SH reviewed  ROS: Per HPI unless specifically indicated in ROS section   Meds, vitals, and allergies reviewed.   GEN: nad, alert and oriented HEENT: mucous membranes moist NECK: supple w/o LA CV: IRR, not tachy PULM: ctab, no inc wob, chest wall not ttp/bruised.  ABD: soft, +bs EXT: no edema SKIN: no acute rash

## 2018-02-01 ENCOUNTER — Other Ambulatory Visit: Payer: Self-pay | Admitting: Family Medicine

## 2018-02-01 ENCOUNTER — Telehealth: Payer: Self-pay

## 2018-02-01 DIAGNOSIS — K922 Gastrointestinal hemorrhage, unspecified: Secondary | ICD-10-CM

## 2018-02-01 NOTE — Assessment & Plan Note (Addendum)
Inpatient course and labs and rationale for treatment discussed with patient.   Advised pt to keep the appointment with GI.  Keep taking protonix twice a day.  If any abdominal pain or blood in stool, then call the GI clinic. See notes on labs.  At this point still okay for outpatient follow-up.  Still off Coumadin.  Would hold anticoagulation at this point, at least until his situation is stabilized and his blood counts are improved.  I will need input from his other consultants about anticoagulation in the future.

## 2018-02-01 NOTE — Assessment & Plan Note (Signed)
Would stop enalapril for now and he'll see if that helps with being lightheaded.  D/w pt.  He agrees.

## 2018-02-01 NOTE — Telephone Encounter (Signed)
Copied from [place] [id number]. Topic: Quick Communication - Lab Results >> Feb 01, 2018  3:17 PM Synthia Innocent wrote: Requesting lab results

## 2018-02-02 ENCOUNTER — Ambulatory Visit: Payer: Medicare Other | Admitting: Cardiovascular Disease

## 2018-02-06 ENCOUNTER — Telehealth: Payer: Self-pay | Admitting: Gastroenterology

## 2018-02-06 NOTE — Telephone Encounter (Signed)
Pt is calling his rx pronaex  is 2 a day is giving him dirrhea he is staying in the bathroom please call pt  He was taking blood thinner  ??

## 2018-02-08 ENCOUNTER — Telehealth: Payer: Self-pay

## 2018-02-08 ENCOUNTER — Other Ambulatory Visit: Payer: Medicare Other

## 2018-02-08 NOTE — Telephone Encounter (Signed)
Copied from [place] [id number]. Topic: Appointment Scheduling - Scheduling Inquiry for Clinic >> Feb 08, 2018  3:23 PM Arletha Grippe wrote: Reason for CRM: pt is calling for [facility] - needs to know when to schedule coumadin appt.  Declined to schedule with me due to not knowing the date that he needs to come in.  Cb is [phone]

## 2018-02-08 NOTE — Telephone Encounter (Signed)
Spoke with patient and reminded him that he is currently off of coumadin.  I asked if he came for his blood work today as I do not see it processing.  He replied, "No, I just didn't feel like going anywhere today".  I informed him that he has to keep his appointments because we do not know what to do about his medication and in particular his coumadin without his repeat labs.  I explained how very important it was that he keep his follow up appointments.  Patient made no response.  He did agree to come in tomorrow 5/9 for his lab visit and understands that we will discuss further management following his results.  Patient verbalized understanding.

## 2018-02-08 NOTE — Telephone Encounter (Signed)
Noted. I agree.  Thanks.  I'll await the labs.

## 2018-02-09 ENCOUNTER — Encounter: Payer: Self-pay | Admitting: Radiology

## 2018-02-09 ENCOUNTER — Other Ambulatory Visit (INDEPENDENT_AMBULATORY_CARE_PROVIDER_SITE_OTHER): Payer: Medicare Other

## 2018-02-09 ENCOUNTER — Ambulatory Visit: Payer: Self-pay | Admitting: General Practice

## 2018-02-09 DIAGNOSIS — K922 Gastrointestinal hemorrhage, unspecified: Secondary | ICD-10-CM | POA: Diagnosis not present

## 2018-02-09 DIAGNOSIS — Z7901 Long term (current) use of anticoagulants: Secondary | ICD-10-CM

## 2018-02-09 LAB — CBC WITH DIFFERENTIAL/PLATELET
BASOS PCT: 2.2 % (ref 0.0–3.0)
Basophils Absolute: 0.1 10*3/uL (ref 0.0–0.1)
EOS PCT: 1.8 % (ref 0.0–5.0)
Eosinophils Absolute: 0.1 10*3/uL (ref 0.0–0.7)
HCT: 28 % — ABNORMAL LOW (ref 39.0–52.0)
HEMOGLOBIN: 9.1 g/dL — AB (ref 13.0–17.0)
Lymphocytes Relative: 42.6 % (ref 12.0–46.0)
Lymphs Abs: 1.2 10*3/uL (ref 0.7–4.0)
MCHC: 32.6 g/dL (ref 30.0–36.0)
MCV: 78.3 fl (ref 78.0–100.0)
MONOS PCT: 17.5 % — AB (ref 3.0–12.0)
Monocytes Absolute: 0.5 10*3/uL (ref 0.1–1.0)
Neutro Abs: 1 10*3/uL — ABNORMAL LOW (ref 1.4–7.7)
Neutrophils Relative %: 35.9 % — ABNORMAL LOW (ref 43.0–77.0)
Platelets: 251 10*3/uL (ref 150.0–400.0)
RBC: 3.57 Mil/uL — ABNORMAL LOW (ref 4.22–5.81)
RDW: 16.7 % — AB (ref 11.5–15.5)
WBC: 2.9 10*3/uL — AB (ref 4.0–10.5)

## 2018-02-09 NOTE — Telephone Encounter (Signed)
Pt states his diarrhea has stopped and will at this time continue his Protonix as is. He was not sure what caused it. He did see his doctor today that prescribes the coumadin as he had not started it back yet. Pt states he done some lab work regarding it.  Pt encouraged to contact office if any problems or concerns.

## 2018-02-10 ENCOUNTER — Other Ambulatory Visit: Payer: Self-pay | Admitting: Family Medicine

## 2018-02-10 DIAGNOSIS — D508 Other iron deficiency anemias: Secondary | ICD-10-CM

## 2018-02-16 ENCOUNTER — Telehealth: Payer: Self-pay | Admitting: Family Medicine

## 2018-02-16 NOTE — Telephone Encounter (Signed)
Son Octavia Bruckner) says that his Dad is not doing well at all.  Son wants to know why the patient is not on Coumadin and if he needs to be on it, he will get him on it, just tell him the correct dose.  We do not have a DPR to speak with the son so I advised him that I could take information but could not give information on the patient.  Son says he will accompany the patient at an appointment next week.  Appointment was scheduled for Tuesday, May 21 at 12:15 pm (30 min).  Please advise.

## 2018-02-16 NOTE — Telephone Encounter (Signed)
Copied from [place] [id number]. Topic: Quick Communication - See Telephone Encounter >> Feb 16, 2018  1:41 PM Rutherford Nail, [place] wrote: CRM for notification. See Telephone encounter for: 02/16/18. Wife calling and states that she would like a call back today because Tim, patient's son, has some questions and concerns. States that he is not doing well since they took him off of the coumadin and put him on something else. Please advise. CB#:[phone]

## 2018-02-17 NOTE — Telephone Encounter (Signed)
We can see at [place].  It is correct not to give information when we don't have DPR.  Thanks.

## 2018-02-21 ENCOUNTER — Ambulatory Visit: Payer: Medicare Other | Admitting: Physician Assistant

## 2018-02-21 ENCOUNTER — Encounter: Payer: Self-pay | Admitting: Family Medicine

## 2018-02-21 ENCOUNTER — Ambulatory Visit (INDEPENDENT_AMBULATORY_CARE_PROVIDER_SITE_OTHER): Payer: Medicare Other | Admitting: Family Medicine

## 2018-02-21 VITALS — BP 82/42 | HR 76 | Temp 97.6°F | Ht 71.0 in | Wt 150.5 lb

## 2018-02-21 DIAGNOSIS — D508 Other iron deficiency anemias: Secondary | ICD-10-CM

## 2018-02-21 DIAGNOSIS — R0602 Shortness of breath: Secondary | ICD-10-CM

## 2018-02-21 LAB — CBC WITH DIFFERENTIAL/PLATELET
Basophils Absolute: 0 10*3/uL (ref 0.0–0.1)
Basophils Relative: 1 % (ref 0.0–3.0)
EOS PCT: 0.4 % (ref 0.0–5.0)
Eosinophils Absolute: 0 10*3/uL (ref 0.0–0.7)
HCT: 32.6 % — ABNORMAL LOW (ref 39.0–52.0)
Hemoglobin: 10.6 g/dL — ABNORMAL LOW (ref 13.0–17.0)
LYMPHS ABS: 1.7 10*3/uL (ref 0.7–4.0)
Lymphocytes Relative: 39.2 % (ref 12.0–46.0)
MCHC: 32.5 g/dL (ref 30.0–36.0)
MCV: 77.6 fl — ABNORMAL LOW (ref 78.0–100.0)
MONO ABS: 0.6 10*3/uL (ref 0.1–1.0)
Monocytes Relative: 14.9 % — ABNORMAL HIGH (ref 3.0–12.0)
NEUTROS PCT: 44.5 % (ref 43.0–77.0)
Neutro Abs: 1.9 10*3/uL (ref 1.4–7.7)
PLATELETS: 247 10*3/uL (ref 150.0–400.0)
RBC: 4.2 Mil/uL — AB (ref 4.22–5.81)
RDW: 17 % — AB (ref 11.5–15.5)
WBC: 4.2 10*3/uL (ref 4.0–10.5)

## 2018-02-21 LAB — BASIC METABOLIC PANEL
BUN: 17 mg/dL (ref 6–23)
CALCIUM: 9.1 mg/dL (ref 8.4–10.5)
CO2: 31 meq/L (ref 19–32)
Chloride: 94 mEq/L — ABNORMAL LOW (ref 96–112)
Creatinine, Ser: 1.13 mg/dL (ref 0.40–1.50)
GFR: 65.88 mL/min (ref >60.00)
Glucose, Bld: 102 mg/dL — ABNORMAL HIGH (ref 70–99)
Potassium: 4.2 mEq/L (ref 3.5–5.1)
SODIUM: 133 meq/L — AB (ref 135–145)

## 2018-02-21 LAB — COMPREHENSIVE METABOLIC PANEL
ALT: 25 U/L (ref 0–53)
AST: 30 U/L (ref 0–37)
Albumin: 3.8 g/dL (ref 3.5–5.2)
Alkaline Phosphatase: 67 U/L (ref 39–117)
BILIRUBIN TOTAL: 0.5 mg/dL (ref 0.2–1.2)
BUN: 17 mg/dL (ref 6–23)
CO2: 31 meq/L (ref 19–32)
CREATININE: 1.13 mg/dL (ref 0.40–1.50)
Calcium: 9.1 mg/dL (ref 8.4–10.5)
Chloride: 94 mEq/L — ABNORMAL LOW (ref 96–112)
GFR: 65.88 mL/min (ref >60.00)
GLUCOSE: 102 mg/dL — AB (ref 70–99)
Potassium: 4.2 mEq/L (ref 3.5–5.1)
SODIUM: 133 meq/L — AB (ref 135–145)
Total Protein: 7.1 g/dL (ref 6.0–8.3)

## 2018-02-21 LAB — FERRITIN: FERRITIN: 48.7 ng/mL (ref 22.0–322.0)

## 2018-02-21 NOTE — Progress Notes (Signed)
He was unclear about some of his medications.  It is unclear if he has been taking torsemide.  He has been off extra sodium replacement in the meantime, per his report.  We went through his med list in detail.  He is here today with his son.    The patient has had difficulty managing his own medications previously.  We had multiple conversations about his Coumadin previously.  He is off anticoagulation currently given his previous bleeding and anemia.  Rationale discussed with patient and son.  He does not have any more bleeding in the meantime.  No lower extremity edema.  He is not having chest pain.  He has been episodically short of breath ever since he has been anemic.  He has a contentious relationship with his wife.  They have been arguing more.  This was reported by the patient and the son.  He is still safe at home.  Patient gave explicit instructions to me that we can release information to his son.  I asked him to sign a DPR on the way out of clinic today.  PMH and SH reviewed  ROS: Per HPI unless specifically indicated in ROS section   Meds, vitals, and allergies reviewed.   GEN: nad, alert and oriented with 2/3 on recall. Normal orientation.   HEENT: mucous membranes moist NECK: supple w/o LA CV: rrr PULM: ctab, no inc wob ABD: soft, +bs EXT: no edema SKIN: no acute rash

## 2018-02-21 NOTE — Patient Instructions (Addendum)
Go to the lab on the way out.  We'll contact you with your lab report. Stop taking eplerenone for now.  Keep taking iron.  Only take torsemide if you have a lot of swelling.  Use a pill box to set your meds for the week.   Keep the cardiology and GI appointments.  Take care.  Glad to see you.

## 2018-02-22 ENCOUNTER — Encounter: Payer: Self-pay | Admitting: Physician Assistant

## 2018-02-22 ENCOUNTER — Ambulatory Visit: Payer: Medicare Other | Admitting: Physician Assistant

## 2018-02-22 VITALS — BP 92/55 | HR 73 | Ht 71.0 in | Wt 153.0 lb

## 2018-02-22 DIAGNOSIS — I482 Chronic atrial fibrillation: Secondary | ICD-10-CM | POA: Diagnosis not present

## 2018-02-22 DIAGNOSIS — I6523 Occlusion and stenosis of bilateral carotid arteries: Secondary | ICD-10-CM | POA: Diagnosis not present

## 2018-02-22 DIAGNOSIS — I110 Hypertensive heart disease with heart failure: Secondary | ICD-10-CM | POA: Diagnosis not present

## 2018-02-22 DIAGNOSIS — I428 Other cardiomyopathies: Secondary | ICD-10-CM | POA: Diagnosis not present

## 2018-02-22 DIAGNOSIS — I481 Persistent atrial fibrillation: Secondary | ICD-10-CM | POA: Diagnosis not present

## 2018-02-22 DIAGNOSIS — D62 Acute posthemorrhagic anemia: Secondary | ICD-10-CM | POA: Diagnosis not present

## 2018-02-22 DIAGNOSIS — K922 Gastrointestinal hemorrhage, unspecified: Secondary | ICD-10-CM

## 2018-02-22 DIAGNOSIS — D509 Iron deficiency anemia, unspecified: Secondary | ICD-10-CM | POA: Diagnosis not present

## 2018-02-22 DIAGNOSIS — E785 Hyperlipidemia, unspecified: Secondary | ICD-10-CM

## 2018-02-22 DIAGNOSIS — I5022 Chronic systolic (congestive) heart failure: Secondary | ICD-10-CM | POA: Diagnosis not present

## 2018-02-22 DIAGNOSIS — G4730 Sleep apnea, unspecified: Secondary | ICD-10-CM | POA: Diagnosis not present

## 2018-02-22 DIAGNOSIS — Z79899 Other long term (current) drug therapy: Secondary | ICD-10-CM | POA: Diagnosis not present

## 2018-02-22 DIAGNOSIS — H5461 Unqualified visual loss, right eye, normal vision left eye: Secondary | ICD-10-CM | POA: Diagnosis not present

## 2018-02-22 DIAGNOSIS — I951 Orthostatic hypotension: Secondary | ICD-10-CM

## 2018-02-22 DIAGNOSIS — I272 Pulmonary hypertension, unspecified: Secondary | ICD-10-CM

## 2018-02-22 DIAGNOSIS — R42 Dizziness and giddiness: Secondary | ICD-10-CM | POA: Diagnosis not present

## 2018-02-22 DIAGNOSIS — Z7901 Long term (current) use of anticoagulants: Secondary | ICD-10-CM

## 2018-02-22 DIAGNOSIS — E871 Hypo-osmolality and hyponatremia: Secondary | ICD-10-CM

## 2018-02-22 DIAGNOSIS — Z8711 Personal history of peptic ulcer disease: Secondary | ICD-10-CM | POA: Diagnosis not present

## 2018-02-22 DIAGNOSIS — I059 Rheumatic mitral valve disease, unspecified: Secondary | ICD-10-CM | POA: Diagnosis not present

## 2018-02-22 DIAGNOSIS — K219 Gastro-esophageal reflux disease without esophagitis: Secondary | ICD-10-CM | POA: Diagnosis not present

## 2018-02-22 DIAGNOSIS — I4819 Other persistent atrial fibrillation: Secondary | ICD-10-CM

## 2018-02-22 DIAGNOSIS — I2510 Atherosclerotic heart disease of native coronary artery without angina pectoris: Secondary | ICD-10-CM | POA: Diagnosis not present

## 2018-02-22 DIAGNOSIS — N400 Enlarged prostate without lower urinary tract symptoms: Secondary | ICD-10-CM | POA: Diagnosis not present

## 2018-02-22 NOTE — Assessment & Plan Note (Addendum)
There are multiple issues here.  He has follow-up with cardiology tomorrow.  He has follow-up with GI soon.  I am not comfortable putting him back on anticoagulation until he is no longer anemic and we are reasonably certain that he is not going to be bleeding.  We will check to see if he can take a medicine other than Coumadin at that point.  Recheck routine labs today.  His son is going to start helping him manage his medications at home.  We talked about using a pill tray.  The patient does appear to be safe at home otherwise with support from the son.   I gave a detailed med list to the patient's son.  The patient's blood pressure was relatively low today at clinic but he was not lightheaded on standing.  I advised him to stop taking eplerenone in the meantime.  He appears to be still okay for outpatient follow-up.>25 minutes spent in face to face time with patient, >50% spent in counselling or coordination of care.

## 2018-02-22 NOTE — Progress Notes (Signed)
Cardiology Office Note Date:  02/22/2018  Patient ID:  Jeremy, Parsons 10-02-1933, MRN [id number] PCP:  Tonia Ghent, MD  Cardiologist:  Dr. Fletcher Anon, MD    Chief Complaint: Hospital follow-up  History of Present Illness: [name] is a 84 y.o. male with history of persistent A. fib previously on Coumadin with history of some fluctuations in INR status post multiple cardioversions in the past, chronic systolic CHF secondary to nonischemic cardiomyopathy, mitral valve regurgitation status post annuloplasty in 1996, recurrent GI bleeds dating back to at least 2009, previously undergone clipping x3 in 2010 secondary to recurrent GI bleed, more recently admitted to [facility] twice in 01/2018 for recurrent GI bleed as detailed below, iron deficiency anemia with a baseline hemoglobin approximately 9-12, diverticulosis, GERD, bilateral carotid artery disease with known occluded right internal carotid artery and stable, moderate left carotid artery stenosis from ultrasound in 11/2017, chronic hyponatremia previously on salt tabs, hyperlipidemia, and sleep apnea who presents for hospital follow-up.  His A. fib has been known to be associated with CHF exacerbations in the past leading to his multiple cardioversions.  When he was last seen in the office in 11/2016 he had been maintaining sinus rhythm since being placed on amiodarone.  Prior echocardiogram in 12/2013 showed an EF of 35 to 40%, mild aortic stenosis, mitral valve repair with mild to moderate mitral regurgitation, mild to moderate tricuspid regurgitation, PASP 48 mmHg.  Most recent echocardiogram from 12/2016 showed an EF of 25 to 30%, not technically sufficient to allow for LV diastolic function, mild aortic stenosis though this may have been underestimated given the degree of his cardiomyopathy, moderate aortic insufficiency, calcified mitral annulus with a mitral ring noted, moderate mitral regurgitation, moderately to severely dilated  left atrium, moderately dilated right atrium, moderate tricuspid regurgitation, PASP 71 mmHg.    Patient was admitted in early 01/2018 with melena.  EGD showed a normal esophagus with a single bleeding angiodysplastic lesion of the stomach that was treated with argon plasma coagulation.  INR at time of admission 1.9 to (5 days prior to that INR was noted to be 4.5).  It appears as Coumadin was held while admitted though continued at hospital discharge upon reviewing the discharge summary.  He was readmitted 4 days later with generalized weakness and fatigue and noted to be hyponatremic with a sodium of 117 that improved to 132 following tolvaptan.  Hemoglobin remained stable.  He was admitted for the third time in late 01/2018 secondary to recurrent GI bleeding with EGD showing bleeding gastric ulcer felt to be secondary to previously treated AVM, status post cauterization.  INR noted to be 1.28.  Coumadin was held at time of discharge.  He has follow-up with GI pending.  He has since followed up with PCP several times, most recently on 02/21/2018.  He was noted to be hypotensive at 82/42 at that time.  It was unclear if he was taking his some of his medications.  He had not been taking his salt tabs.  He was advised to hold eplerenone.  CMP checked showed a stable Na 133, K+ 4.2, glucose 102, BUN 17, SCr 1.13, normal LFT.  CBC showed WBC 4.2, HGB 10.6, PLT 247.  Coumadin has continued to be held until he follows up with GI.  Comes in accompanied by his son today.  He has not noted any further melena or BRBPR.  No hematuria or hematemesis.  No hemoptysis.  He remains off Coumadin.  He continues  to note dizziness with positional changes and has been spending most of his time lying supine as this is the position in which he feels as best.  He denies any chest pain or shortness of breath.  No syncope or falls.  Appetite is low though improving.  He is not supplementing with Ensure or Boost.  He reports his wife fills  his pillbox and he is not 194% certain on what medications he is taking, including his PRN torsemide.  He has stable 1-2 pillow orthopnea.  No cough, PND, lower extremity swelling, abdominal distention, or early satiety.  He follows up with GI on 5/29.  His son feels like his father is "not doing well."  He further elaborates this by saying the patient is weak, fatigued, dizzy, and has low blood pressure.     Past Medical History:  Diagnosis Date  . Blind right eye   . Diaphragmatic hernia without mention of obstruction or gangrene   . Diverticulosis of colon (without mention of hemorrhage)   . Dizziness and giddiness   . Esophageal reflux   . Gastritis 12/18/08   EGD, 3 clips remaining (Dr. Vira Agar)  . Hypertension   . Iron deficiency anemia    a.  Secondary to recurrent GI bleeding  . Macular degeneration   . Nonischemic cardiomyopathy (HCC)    a.  TTE and 3/15: EF 35 to 40%, mild left ear, mitral valve repair with mild to moderate MR, mild to moderate TR, PASP 48; b.  TTE 3/18: EF 25 to 30%, not technically sufficient to allow for LV diastolic function, mild left ear possibly underestimated secondary to degree of CM, moderate AI calcified mitral annulus, moderate MR mod to sev dilated LA mod dilated RA moderate TR PASP 71  . Persistent atrial fibrillation ([place])    a.  Status post multiple cardioversions; b.  Previously on Coumadin though held secondary to recurrent GI bleed  . Personal history of peptic ulcer disease   . Pure hypercholesterolemia   . Sprain of neck   . Unspecified hemorrhoids without mention of complication   . Unspecified sleep apnea   . Upper GI bleed 7/15-7/20/09   a.  Recurrent GI bleed dating back to at least 2009 with subsequent bleed in 2010, multiple GI bleeds in 01/2018    Past Surgical History:  Procedure Laterality Date  . APPENDECTOMY    . CARDIAC CATHETERIZATION  1996   [facility]  . CARDIAC CATHETERIZATION    . CARDIAC CATHETERIZATION    . CARDIOVERSION   12/17/2011   Procedure: CARDIOVERSION;  Surgeon: Hillary Bow, MD;  Location: [place];  Service: Cardiovascular;  Laterality: N/A;  . CATARACT EXTRACTION  10/13/08   left  . CHOLECYSTECTOMY    . ESOPHAGOGASTRODUODENOSCOPY Left 01/17/2018   Procedure: ESOPHAGOGASTRODUODENOSCOPY (EGD);  Surgeon: Virgel Manifold, MD;  Location: [facility] ENDOSCOPY;  Service: Endoscopy;  Laterality: Left;  . ESOPHAGOGASTRODUODENOSCOPY (EGD) WITH PROPOFOL N/A 01/04/2018   Procedure: ESOPHAGOGASTRODUODENOSCOPY (EGD) WITH PROPOFOL;  Surgeon: [name], MD;  Location: [facility] ENDOSCOPY;  Service: Endoscopy;  Laterality: N/A;  . MITRAL VALVE ANNULOPLASTY  1996  . MITRAL VALVE REPAIR  1995  . PARTIAL GASTRECTOMY      Current Meds  Medication Sig  . amiodarone (PACERONE) 200 MG tablet TAKE 1 TABLET BY MOUTH ONCE DAILY  . cholecalciferol (VITAMIN D) 1000 UNITS tablet Take 1,000 Units by mouth daily.  . cyanocobalamin 100 MCG tablet Take 100 mcg by mouth daily.    . ferrous sulfate 325 (  65 FE) MG tablet Take 325 mg by mouth 2 (two) times daily.  . finasteride (PROSCAR) 5 MG tablet Take 1 tablet (5 mg total) by mouth daily.  . fish oil-omega-3 fatty acids 1000 MG capsule Take 1 g by mouth daily.  . hydroxypropyl methylcellulose (ISOPTO TEARS) 2.5 % ophthalmic solution Place 1 drop into both eyes every morning.  . metoprolol succinate (TOPROL-XL) 50 MG 24 hr tablet TAKE ONE TABLET BY MOUTH ONCE DAILY WITH MEALS  . Multiple Vitamin (MULTIVITAMIN) tablet Take 1 tablet by mouth daily.    . pantoprazole (PROTONIX) 40 MG tablet Take 1 tablet (40 mg total) by mouth 2 (two) times daily before a meal.  . tamsulosin (FLOMAX) 0.4 MG CAPS capsule Take 1 capsule (0.4 mg total) by mouth daily.  Marland Kitchen torsemide (DEMADEX) 20 MG tablet Take 1 tablet (20 mg total) by mouth daily as needed (for swelling).  . vitamin C (ASCORBIC ACID) 500 MG tablet Take 500 mg by mouth daily.    Allergies:   Atorvastatin and Statins   Social History:  The  patient  reports that he has never smoked. He has never used smokeless tobacco. He reports that he drinks alcohol. He reports that he does not use drugs.   Family History:  The patient's family history includes Cancer in his father.  ROS:   Review of Systems  Constitutional: Positive for malaise/fatigue and weight loss. Negative for chills, diaphoresis and fever.  HENT: Negative for congestion.   Eyes: Negative for discharge and redness.  Respiratory: Negative for cough, hemoptysis, sputum production, shortness of breath and wheezing.   Cardiovascular: Negative for chest pain, palpitations, orthopnea, claudication, leg swelling and PND.  Gastrointestinal: Negative for abdominal pain, blood in stool, constipation, diarrhea, heartburn, melena, nausea and vomiting.       Negative for hematemesis  Genitourinary: Negative for hematuria.  Musculoskeletal: Negative for falls and myalgias.  Skin: Negative for rash.  Neurological: Positive for dizziness and weakness. Negative for tingling, tremors, sensory change, speech change, focal weakness, seizures, loss of consciousness and headaches.  Endo/Heme/Allergies: Does not bruise/bleed easily.  Psychiatric/Behavioral: Negative for depression and substance abuse. The patient is not nervous/anxious.   All other systems reviewed and are negative.    PHYSICAL EXAM:  VS:  BP (!) 92/55 (BP Location: Left Arm, Patient Position: Sitting, Cuff Size: Normal)   Pulse 73   Ht 5\' 11"  (1.803 m)   Wt 153 lb (69.4 kg)   BMI 21.34 kg/m  BMI: Body mass index is 21.34 kg/m.  Physical Exam  Constitutional: He is oriented to person, place, and time. He appears well-developed and well-nourished.  HENT:  Head: Normocephalic and atraumatic.  Eyes: Right eye exhibits no discharge. Left eye exhibits no discharge.  Neck: Normal range of motion. No JVD present.  Cardiovascular: Normal rate, regular rhythm, S1 normal and S2 normal. Exam reveals no distant heart sounds,  no friction rub, no midsystolic click and no opening snap.  Murmur heard. High-pitched blowing holosystolic murmur is present with a grade of 2/6 at the apex. Pulses:      Posterior tibial pulses are 1+ on the right side, and 1+ on the left side.  Pulmonary/Chest: Effort normal and breath sounds normal. No respiratory distress. He has no decreased breath sounds. He has no wheezes. He has no rales. He exhibits no tenderness.  Abdominal: Soft. He exhibits no distension. There is no tenderness.  Musculoskeletal: He exhibits no edema.  Neurological: He is alert and oriented to person,  place, and time.  Skin: Skin is warm and dry. No cyanosis. Nails show no clubbing.  Psychiatric: He has a normal mood and affect. His speech is normal and behavior is normal. Judgment and thought content normal.     EKG:  Was ordered and interpreted by me today. Shows ectopic atrial rhythm with first-degree AV block, 69 bpm, nonspecific IVCD, left axis deviation, prior inferior infarct, lateral T wave inversion, QTC 490  Orthostatic Vital Signs: Lying: 102/61, 68  bpm Sitting: 91/54, 71 bpm Standing: 55/40, 74 bpm - dizzy Standing x 3 minutes: 60/38, 78 bpm -dizzy  Recent Labs: 10/20/2017: TSH 0.790 01/07/2018: B Natriuretic Peptide 622.0 02/21/2018: ALT 25; BUN 17; BUN 17; Creatinine, Ser 1.13; Creatinine, Ser 1.13; Hemoglobin 10.6; Platelets 247.0; Potassium 4.2; Potassium 4.2; Sodium 133; Sodium 133  10/20/2017: Cholesterol, Total 178; HDL 59; LDL Calculated 104; Triglycerides 75   Estimated Creatinine Clearance: 49.5 mL/min (by C-G formula based on SCr of 1.13 mg/dL).   Wt Readings from Last 3 Encounters:  02/22/18 153 lb (69.4 kg)  02/21/18 150 lb 8 oz (68.3 kg)  01/30/18 160 lb 8 oz (72.8 kg)     Other studies reviewed: Additional studies/records reviewed today include: summarized above  ASSESSMENT AND PLAN:  1. Chronic systolic CHF secondary to NICM/pulmonary hypertension: He does not appear  grossly volume overloaded at this time.  He has previously been on enalapril, eplerenone, and currently on Toprol for his heart failure.  Following his most recent hospital admission for GI bleed his enalapril was held secondary to soft blood pressure.  More recently, his eplerenone was discontinued on 5/21 and his torsemide was changed from scheduled daily dosing to as needed dosing.  He currently remains on Toprol-XL 50 mg daily.  Unfortunately, there is no blood pressure room to reinitiate his evidence-based heart failure therapy.  We have stopped his Toprol-XL today secondary to profound hypotension/orthostasis.  We will check an echocardiogram to evaluate his LV systolic function, valvular function including prior mitral valve repair and right-sided pressure.  2. Persistent A. Fib: Remains in sinus rhythm.  Continue amiodarone 200 mg daily in an effort to maintain sinus rhythm.  Toprol-XL stopped given his hypotension/orthostasis.  Coumadin has been held since his hospital admission in late 01/2017 secondary to recurrent GI bleed.  Agree with continuing to hold Coumadin at this time until he is evaluated by GI and felt to be at acceptable risk for resumption of oral anticoagulation.  Should he be felt to be acceptable risk for resumption of full dose anticoagulation we recommend Eliquis 5 mg twice daily in place of Coumadin per 2019 A. fib guidelines.  This has been discussed with Dr. Fletcher Anon.  Both patient and son are aware of increased stroke risk while off full dose anticoagulation.  3. Recurrent GI bleed: No symptoms concerning for recurrence of bleed at this time.  Recommend he keep his follow-up with GI on 03/01/2018.  Recommend resumption of full dose anticoagulation when/if felt to be acceptable risk by GI.  4. Iron deficiency anemia: Recent CBC from 02/21/2018 showed a low, though improved HGB of 10.6 with a ferritin level of 48.7.  Remains on supplemental iron therapy. If low hemoglobin/iron levels  persist could consider iron infusions.  5. Hypotension/orthostasis/weakness/fatigue: Overall, this is an extremely difficult situation given the patient's severe comorbid conditions including cardiomyopathy with an EF of 25 to 30% by TTE in 12/2016 as well as recurrent GI bleed with acute blood loss anemia, hypovolemia, hyponatremia, and orthostatic hypotension.  Fludrocortisone is not an option given his cardiomyopathy.  Potentially, could add low-dose midodrine; however, this is less than ideal at this time given the patient prefers to lay fully supine.  We have ultimately decided to discontinue his Toprol-XL today as well as prescribed thigh-high compression stockings.  Detailed instructions were provided to the patient regarding placement and removal of compression stockings.  I offered him an abdominal binder as well, however he declines this at this time.  He may ultimately require this however.  Should he continue to have such profound drops and orthostatic blood pressures further evaluation/treatment with certainly be indicated.  Would hope to avoid complete wheelchair bound status.  6. High risk medications: Amiodarone - recent LFT and TSH normal.  QTc stable as above.    Coumadin -on hold since most recent GI bleed.  We will continue to hold full dose anticoagulation until he has been evaluated by GI and felt to be at acceptable risk for resumption, if he is felt to be able to resume anticoagulation.  7. Mitral valve disease: Status post annuloplasty in 1996.  Check echocardiogram as above.  8. Carotid artery disease: Stable by carotid artery ultrasound in 11/2017.  Follow-up recommended 1 year.  9. Chronic hyponatremia: Stable on CMP drawn 5/21.  Reports compliance with his salt tabs.  Recommend follow-up with nephrology.  10. Hyperlipidemia: LDL of 104 from 10/2017.  Intolerant to statins secondary to myalgias.   Disposition: F/u with myself, Ignacia Bayley, or Dr. Fletcher Anon in 2 weeks.  Current  medicines are reviewed at length with the patient today.  The patient did not have any concerns regarding medicines.  Signed, Christell Faith, PA-C 02/22/2018 2:55 PM     [name], [place] [id number] [phone]

## 2018-02-22 NOTE — Patient Instructions (Addendum)
Medication Instructions:  Your physician has recommended you make the following change in your medication:  STOP Metoprolol. STOP Torsemide.   Labwork: none  Testing/Procedures: Your physician has requested that you have an echocardiogram. Echocardiography is a painless test that uses sound waves to create images of your heart. It provides your doctor with information about the size and shape of your heart and how well your heart's chambers and valves are working. This procedure takes approximately one hour. There are no restrictions for this procedure.  You may get an IV, if needed, to receive an ultrasound enhancing agent through to better visualize your heart.    Follow-Up: Your physician recommends that you schedule a follow-up appointment in: [address] [place], [place].  A Prescription for compression stockings will be provided for you as well. You may take to a pharmacy or medical supply store.   If you need a refill on your cardiac medications before your next appointment, please call your pharmacy.    Echocardiogram An echocardiogram, or echocardiography, uses sound waves (ultrasound) to produce an image of your heart. The echocardiogram is simple, painless, obtained within a short period of time, and offers valuable information to your health care provider. The images from an echocardiogram can provide information such as:  Evidence of coronary artery disease (CAD).  Heart size.  Heart muscle function.  Heart valve function.  Aneurysm detection.  Evidence of a past heart attack.  Fluid buildup around the heart.  Heart muscle thickening.  Assess heart valve function.  Tell a health care provider about:  Any allergies you have.  All medicines you are taking, including vitamins, herbs, eye drops, creams, and over-the-counter medicines.  Any problems you or family members have had with anesthetic medicines.  Any blood disorders you have.  Any  surgeries you have had.  Any medical conditions you have.  Whether you are pregnant or may be pregnant. What happens before the procedure? No special preparation is needed. Eat and drink normally. What happens during the procedure?  In order to produce an image of your heart, gel will be applied to your chest and a wand-like tool (transducer) will be moved over your chest. The gel will help transmit the sound waves from the transducer. The sound waves will harmlessly bounce off your heart to allow the heart images to be captured in real-time motion. These images will then be recorded.  You may need an IV to receive a medicine that improves the quality of the pictures. What happens after the procedure? You may return to your normal schedule including diet, activities, and medicines, unless your health care provider tells you otherwise. This information is not intended to replace advice given to you by your health care provider. Make sure you discuss any questions you have with your health care provider. Document Released: 09/17/2000 Document Revised: 05/08/2016 Document Reviewed: 05/28/2013 Elsevier Interactive Patient Education  2017 [employer].

## 2018-02-23 ENCOUNTER — Other Ambulatory Visit: Payer: Medicare Other

## 2018-02-28 DIAGNOSIS — H353221 Exudative age-related macular degeneration, left eye, with active choroidal neovascularization: Secondary | ICD-10-CM | POA: Diagnosis not present

## 2018-02-28 DIAGNOSIS — H35372 Puckering of macula, left eye: Secondary | ICD-10-CM | POA: Diagnosis not present

## 2018-03-01 ENCOUNTER — Ambulatory Visit: Payer: Medicare Other | Admitting: Gastroenterology

## 2018-03-06 ENCOUNTER — Encounter: Payer: Self-pay | Admitting: Emergency Medicine

## 2018-03-06 ENCOUNTER — Emergency Department
Admission: EM | Admit: 2018-03-06 | Discharge: 2018-03-06 | Disposition: A | Discharge status: Home / Self Care | Payer: Medicare Other | Attending: Emergency Medicine | Admitting: Emergency Medicine

## 2018-03-06 ENCOUNTER — Other Ambulatory Visit: Payer: Self-pay

## 2018-03-06 ENCOUNTER — Telehealth: Payer: Self-pay | Admitting: Gastroenterology

## 2018-03-06 DIAGNOSIS — I10 Essential (primary) hypertension: Secondary | ICD-10-CM | POA: Insufficient documentation

## 2018-03-06 DIAGNOSIS — R42 Dizziness and giddiness: Secondary | ICD-10-CM | POA: Insufficient documentation

## 2018-03-06 DIAGNOSIS — K625 Hemorrhage of anus and rectum: Secondary | ICD-10-CM | POA: Diagnosis present

## 2018-03-06 DIAGNOSIS — Z79899 Other long term (current) drug therapy: Secondary | ICD-10-CM | POA: Diagnosis not present

## 2018-03-06 LAB — COMPREHENSIVE METABOLIC PANEL
ALBUMIN: 3.9 g/dL (ref 3.5–5.0)
ALK PHOS: 60 U/L (ref 38–126)
ALT: 28 U/L (ref 17–63)
AST: 35 U/L (ref 15–41)
Anion gap: 11 (ref 5–15)
BUN: 19 mg/dL (ref 6–20)
CALCIUM: 9.2 mg/dL (ref 8.9–10.3)
CO2: 28 mmol/L (ref 22–32)
CREATININE: 1.39 mg/dL — AB (ref 0.61–1.24)
Chloride: 96 mmol/L — ABNORMAL LOW (ref 101–111)
GFR calc Af Amer: 53 mL/min — ABNORMAL LOW (ref >60)
GFR calc non Af Amer: 46 mL/min — ABNORMAL LOW (ref >60)
GLUCOSE: 103 mg/dL — AB (ref 65–99)
Potassium: 3.8 mmol/L (ref 3.5–5.1)
Sodium: 135 mmol/L (ref 135–145)
Total Bilirubin: 0.6 mg/dL (ref 0.3–1.2)
Total Protein: 7.5 g/dL (ref 6.5–8.1)

## 2018-03-06 LAB — CBC
HCT: 34.8 % — ABNORMAL LOW (ref 40.0–52.0)
Hemoglobin: 11.2 g/dL — ABNORMAL LOW (ref 13.0–18.0)
MCH: 25.7 pg — AB (ref 26.0–34.0)
MCHC: 32.3 g/dL (ref 32.0–36.0)
MCV: 79.5 fL — ABNORMAL LOW (ref 80.0–100.0)
Platelets: 237 10*3/uL (ref 150–440)
RBC: 4.38 MIL/uL — ABNORMAL LOW (ref 4.40–5.90)
RDW: 19 % — AB (ref 11.5–14.5)
WBC: 3 10*3/uL — ABNORMAL LOW (ref 3.8–10.6)

## 2018-03-06 LAB — TROPONIN I: Troponin I: <0.03 ng/mL (ref <0.03)

## 2018-03-06 MED ORDER — SODIUM CHLORIDE 0.9 % IV BOLUS
1000.0000 mL | Freq: Once | INTRAVENOUS | Status: AC
  Administered 2018-03-06: 1000 mL via INTRAVENOUS

## 2018-03-06 NOTE — ED Triage Notes (Signed)
States has had black blood with stools x 5 days. States has had previous episodes of same with admission approx 3 weeks ago. Denies vomiting blood.

## 2018-03-06 NOTE — ED Provider Notes (Signed)
[facility] Emergency Department Provider Note   ____________________________________________   First MD Initiated Contact with Patient 03/06/18 1354     (approximate)  I have reviewed the triage vital signs and the nursing notes.   HISTORY  Chief Complaint Rectal Bleeding    HPI [name] is a 84 y.o. male who comes in complaining of lightheadedness when he stands and black stools.  He was seen in the hospital about 3 weeks ago for black stools was diagnosed with a upper GI bleed after endoscopy.  He had been feeling somewhat better but is now feeling worse again.  He is not vomiting any blood.  He is otherwise feeling okay.   Past Medical History:  Diagnosis Date  . Blind right eye   . Diaphragmatic hernia without mention of obstruction or gangrene   . Diverticulosis of colon (without mention of hemorrhage)   . Dizziness and giddiness   . Esophageal reflux   . Gastritis 12/18/08   EGD, 3 clips remaining (Dr. Vira Agar)  . Hypertension   . Iron deficiency anemia    a.  Secondary to recurrent GI bleeding  . Macular degeneration   . Nonischemic cardiomyopathy (HCC)    a.  TTE and 3/15: EF 35 to 40%, mild left ear, mitral valve repair with mild to moderate MR, mild to moderate TR, PASP 48; b.  TTE 3/18: EF 25 to 30%, not technically sufficient to allow for LV diastolic function, mild left ear possibly underestimated secondary to degree of CM, moderate AI calcified mitral annulus, moderate MR mod to sev dilated LA mod dilated RA moderate TR PASP 71  . Persistent atrial fibrillation ([place])    a.  Status post multiple cardioversions; b.  Previously on Coumadin though held secondary to recurrent GI bleed  . Personal history of peptic ulcer disease   . Pure hypercholesterolemia   . Sprain of neck   . Unspecified hemorrhoids without mention of complication   . Unspecified sleep apnea   . Upper GI bleed 7/15-7/20/09   a.  Recurrent GI bleed dating  back to at least 2009 with subsequent bleed in 2010, multiple GI bleeds in 01/2018    Patient Active Problem List   Diagnosis Date Noted  . Acute gastric ulcer with hemorrhage   . Stomach irritation   . Hyponatremia 01/07/2018  . Melena   . Blood in stool 01/02/2018  . Rhinitis 11/04/2017  . Bilateral carotid bruits 11/17/2015  . H. pylori infection 10/26/2015  . Persistent atrial fibrillation ([place]) 04/28/2015  . Medicare annual wellness visit, subsequent 01/31/2015  . Advance care planning 01/31/2015  . Long term current use of anticoagulant therapy 01/30/2015  . Abnormal TSH 02/16/2014  . Chronic systolic heart failure ([place]) 08/16/2013  . Fatigue 09/08/2012  . Cardiomyopathy, dilated, nonischemic ([place]) 12/19/2011  . Hypertension 01/20/2011  . ROSACEA 11/12/2009  . BPH with obstruction/lower urinary tract symptoms 09/03/2009  . MITRAL VALVE REPLACEMENT, HX OF 06/11/2009  . OTHER AND UNSPECIFIED MITRAL VALVE DISEASES 03/04/2009  . PEPTIC ULCER DISEASE, HX OF 02/27/2009  . ANEMIA, IRON DEFICIENCY 04/09/2008  . GERD 04/09/2008  . HYPERCHOLESTEROLEMIA 04/08/2008  . LEFT VENTRICULAR FAILURE 04/08/2008  . HEMORRHOIDS 04/08/2008  . HIATAL HERNIA 04/08/2008  . DIVERTICULOSIS, COLON 04/08/2008  . SLEEP APNEA 04/08/2008  . GASTROINTESTINAL HEMORRHAGE, HX OF 04/08/2008    Past Surgical History:  Procedure Laterality Date  . APPENDECTOMY    . CARDIAC CATHETERIZATION  1996   [facility]  . CARDIAC CATHETERIZATION    .  CARDIAC CATHETERIZATION    . CARDIOVERSION  12/17/2011   Procedure: CARDIOVERSION;  Surgeon: Hillary Bow, MD;  Location: [place];  Service: Cardiovascular;  Laterality: N/A;  . CATARACT EXTRACTION  10/13/08   left  . CHOLECYSTECTOMY    . ESOPHAGOGASTRODUODENOSCOPY Left 01/17/2018   Procedure: ESOPHAGOGASTRODUODENOSCOPY (EGD);  Surgeon: Virgel Manifold, MD;  Location: [facility] ENDOSCOPY;  Service: Endoscopy;  Laterality: Left;  . ESOPHAGOGASTRODUODENOSCOPY (EGD) WITH  PROPOFOL N/A 01/04/2018   Procedure: ESOPHAGOGASTRODUODENOSCOPY (EGD) WITH PROPOFOL;  Surgeon: [name], MD;  Location: [facility] ENDOSCOPY;  Service: Endoscopy;  Laterality: N/A;  . MITRAL VALVE ANNULOPLASTY  1996  . MITRAL VALVE REPAIR  1995  . PARTIAL GASTRECTOMY      Prior to Admission medications   Medication Sig Start Date End Date Taking? Authorizing Provider  amiodarone (PACERONE) 200 MG tablet TAKE 1 TABLET BY MOUTH ONCE DAILY 11/08/17   Wellington Hampshire, MD  cholecalciferol (VITAMIN D) 1000 UNITS tablet Take 1,000 Units by mouth daily.    [provider]  cyanocobalamin 100 MCG tablet Take 100 mcg by mouth daily.      [provider]  ferrous sulfate 325 (65 FE) MG tablet Take 325 mg by mouth 2 (two) times daily.    [provider]  finasteride (PROSCAR) 5 MG tablet Take 1 tablet (5 mg total) by mouth daily. 11/03/17   Tonia Ghent, MD  fish oil-omega-3 fatty acids 1000 MG capsule Take 1 g by mouth daily.    [provider]  hydroxypropyl methylcellulose (ISOPTO TEARS) 2.5 % ophthalmic solution Place 1 drop into both eyes every morning.    [provider]  Multiple Vitamin (MULTIVITAMIN) tablet Take 1 tablet by mouth daily.      [provider]  pantoprazole (PROTONIX) 40 MG tablet Take 1 tablet (40 mg total) by mouth 2 (two) times daily before a meal. 01/30/18 01/30/19  Tonia Ghent, MD  tamsulosin (FLOMAX) 0.4 MG CAPS capsule Take 1 capsule (0.4 mg total) by mouth daily. 10/19/17   Tonia Ghent, MD  vitamin C (ASCORBIC ACID) 500 MG tablet Take 500 mg by mouth daily.    [provider]    Allergies Atorvastatin and Statins  Family History  Problem Relation Age of Onset  . Cancer Father        lung  . Colon cancer Neg Hx   . Prostate cancer Neg Hx     Social History Social History   Tobacco Use  . Smoking status: Never Smoker  . Smokeless tobacco: Never Used  Substance Use Topics  . Alcohol use: Yes      Comment: socially, beer   . Drug use: No    Review of Systems  Constitutional: No fever/chills Eyes: No visual changes. ENT: No sore throat. Cardiovascular: Denies chest pain. Respiratory: Denies shortness of breath. Gastrointestinal: No abdominal pain.  No nausea, no vomiting.  No diarrhea.  No constipation. Genitourinary: Negative for dysuria. Musculoskeletal: Negative for back pain. Skin: Negative for rash. Neurological: Negative for headaches, focal weakness   ____________________________________________   PHYSICAL EXAM:  VITAL SIGNS: ED Triage Vitals  Enc Vitals Group     BP 03/06/18 1207 (!) 109/58     Pulse Rate 03/06/18 1207 66     Resp 03/06/18 1207 20     Temp 03/06/18 1207 98.2 F (36.8 C)     Temp Source 03/06/18 1207 Oral     SpO2 03/06/18 1207 100 %     Weight  03/06/18 1209 165 lb (74.8 kg)     Height 03/06/18 1209 5\' 11"  (1.803 m)     Head Circumference --      Peak Flow --      Pain Score 03/06/18 1209 0     Pain Loc --      Pain Edu? --      Excl. in [place]? --     Constitutional: Alert and oriented. Well appearing and in no acute distress. Eyes: Conjunctivae are normal. Head: Atraumatic. Nose: No congestion/rhinnorhea. Mouth/Throat: Mucous membranes are moist.  Oropharynx non-erythematous. Neck: No stridor.  Cardiovascular: Normal rate, regular rhythm. Grossly normal heart sounds.  Good peripheral circulation. Respiratory: Normal respiratory effort.  No retractions. Lungs CTAB. Gastrointestinal: Soft and nontender. No distention. No abdominal bruits. No CVA tenderness. Rectal: Stool is black but Hemoccult negative no hemorrhoids are seen or palpated Musculoskeletal: No lower extremity tenderness nor edema.  No joint effusions. Neurologic:  Normal speech and language. No gross focal neurologic deficits are appreciated Skin:  Skin is warm, dry and intact. No rash noted. Psychiatric: Mood and affect are normal. Speech and behavior are  normal.  ____________________________________________   LABS (all labs ordered are listed, but only abnormal results are displayed)  Labs Reviewed  COMPREHENSIVE METABOLIC PANEL - Abnormal; Notable for the following components:      Result Value   Chloride 96 (*)    Glucose, Bld 103 (*)    Creatinine, Ser 1.39 (*)    GFR calc non Af Amer 46 (*)    GFR calc Af Amer 53 (*)    All other components within normal limits  CBC - Abnormal; Notable for the following components:   WBC 3.0 (*)    RBC 4.38 (*)    Hemoglobin 11.2 (*)    HCT 34.8 (*)    MCV 79.5 (*)    MCH 25.7 (*)    RDW 19.0 (*)    All other components within normal limits  TROPONIN I  CBC   ____________________________________________  EKG  EKG is pending ____________________________________________  RADIOLOGY  ED MD interpretation:    Official radiology report(s): No results found.  ____________________________________________   PROCEDURES  Procedure(s) performed:   Procedures  Critical Care performed:   ____________________________________________   INITIAL IMPRESSION / ASSESSMENT AND PLAN / ED COURSE Patient is very orthostatic on testing.  We will give him 2 L of fluid and recheck his H&H.     Clinical Course as of Mar 06 1528  Mon Mar 06, 2018  1441 Troponin I [PM]    Clinical Course User Index [PM] Nena Polio, MD     ____________________________________________   FINAL CLINICAL IMPRESSION(S) / ED DIAGNOSES  Final diagnoses:  Lightheaded     ED Discharge Orders    None       Note:  This document was prepared using Dragon voice recognition software and may include unintentional dictation errors.    Nena Polio, MD 03/06/18 [phone]

## 2018-03-06 NOTE — Discharge Instructions (Signed)
Please be sure to drink plenty of fluids.  Continue to use salt in your diet . Be sure to follow-up with your cardiologist and your gastroenterologist.  Return if you are worse.  Watch for more rectal bleeding and return if you have it.  Continue to follow-up with your primary care doctor as well.  We will set you up with home physical therapy.

## 2018-03-06 NOTE — ED Provider Notes (Signed)
-----------------------------------------   3:43 PM on 03/06/2018 -----------------------------------------   Assuming care from Dr. Cinda Quest.  In short, [name] is a 84 y.o. male with a chief complaint of weakness/orthostatic hypotension.  Refer to the original H&P for additional details.  The current plan of care is to reassess after patient gets 2L NS IV bolus(es) to see if he is still orthostatic.     ----------------------------------------- 7:05 PM on 03/06/2018 -----------------------------------------  Dr. Cinda Quest reassessed and evaluated the patient after talking to the patient and family and was able to discharge him for outpatient follow up.  I did not see the patient or participate in his care or disposition plan.   Hinda Kehr, MD 03/06/18 [phone]

## 2018-03-06 NOTE — ED Provider Notes (Signed)
[facility] Emergency Department Provider Note   ____________________________________________   First MD Initiated Contact with Patient 03/06/18 1354     (approximate)  I have reviewed the triage vital signs and the nursing notes.   HISTORY  Chief Complaint Rectal Bleeding    HPI [name] is a 84 y.o. male who comes in complaining of black tarry stools and feeling very weak and lightheaded when he stands.  He was here several days ago and had endoscopy was diagnosed with upper GI bleed.  He has been taking iron pills.  He was feeling somewhat better but again as I said he is now very lightheaded when he stands and tries to walk.  Past Medical History:  Diagnosis Date  . Blind right eye   . Diaphragmatic hernia without mention of obstruction or gangrene   . Diverticulosis of colon (without mention of hemorrhage)   . Dizziness and giddiness   . Esophageal reflux   . Gastritis 12/18/08   EGD, 3 clips remaining (Dr. Vira Agar)  . Hypertension   . Iron deficiency anemia    a.  Secondary to recurrent GI bleeding  . Macular degeneration   . Nonischemic cardiomyopathy (HCC)    a.  TTE and 3/15: EF 35 to 40%, mild left ear, mitral valve repair with mild to moderate MR, mild to moderate TR, PASP 48; b.  TTE 3/18: EF 25 to 30%, not technically sufficient to allow for LV diastolic function, mild left ear possibly underestimated secondary to degree of CM, moderate AI calcified mitral annulus, moderate MR mod to sev dilated LA mod dilated RA moderate TR PASP 71  . Persistent atrial fibrillation ([place])    a.  Status post multiple cardioversions; b.  Previously on Coumadin though held secondary to recurrent GI bleed  . Personal history of peptic ulcer disease   . Pure hypercholesterolemia   . Sprain of neck   . Unspecified hemorrhoids without mention of complication   . Unspecified sleep apnea   . Upper GI bleed 7/15-7/20/09   a.  Recurrent GI bleed dating  back to at least 2009 with subsequent bleed in 2010, multiple GI bleeds in 01/2018    Patient Active Problem List   Diagnosis Date Noted  . Acute gastric ulcer with hemorrhage   . Stomach irritation   . Hyponatremia 01/07/2018  . Melena   . Blood in stool 01/02/2018  . Rhinitis 11/04/2017  . Bilateral carotid bruits 11/17/2015  . H. pylori infection 10/26/2015  . Persistent atrial fibrillation ([place]) 04/28/2015  . Medicare annual wellness visit, subsequent 01/31/2015  . Advance care planning 01/31/2015  . Long term current use of anticoagulant therapy 01/30/2015  . Abnormal TSH 02/16/2014  . Chronic systolic heart failure ([place]) 08/16/2013  . Fatigue 09/08/2012  . Cardiomyopathy, dilated, nonischemic ([place]) 12/19/2011  . Hypertension 01/20/2011  . ROSACEA 11/12/2009  . BPH with obstruction/lower urinary tract symptoms 09/03/2009  . MITRAL VALVE REPLACEMENT, HX OF 06/11/2009  . OTHER AND UNSPECIFIED MITRAL VALVE DISEASES 03/04/2009  . PEPTIC ULCER DISEASE, HX OF 02/27/2009  . ANEMIA, IRON DEFICIENCY 04/09/2008  . GERD 04/09/2008  . HYPERCHOLESTEROLEMIA 04/08/2008  . LEFT VENTRICULAR FAILURE 04/08/2008  . HEMORRHOIDS 04/08/2008  . HIATAL HERNIA 04/08/2008  . DIVERTICULOSIS, COLON 04/08/2008  . SLEEP APNEA 04/08/2008  . GASTROINTESTINAL HEMORRHAGE, HX OF 04/08/2008    Past Surgical History:  Procedure Laterality Date  . APPENDECTOMY    . CARDIAC CATHETERIZATION  1996   [facility]  . CARDIAC  CATHETERIZATION    . CARDIAC CATHETERIZATION    . CARDIOVERSION  12/17/2011   Procedure: CARDIOVERSION;  Surgeon: Hillary Bow, MD;  Location: [place];  Service: Cardiovascular;  Laterality: N/A;  . CATARACT EXTRACTION  10/13/08   left  . CHOLECYSTECTOMY    . ESOPHAGOGASTRODUODENOSCOPY Left 01/17/2018   Procedure: ESOPHAGOGASTRODUODENOSCOPY (EGD);  Surgeon: Virgel Manifold, MD;  Location[facility] ENDOSCOPY;  Service: Endoscopy;  Laterality: Left;  . ESOPHAGOGASTRODUODENOSCOPY (EGD) WITH  PROPOFOL N/A 01/04/2018   Procedure: ESOPHAGOGASTRODUODENOSCOPY (EGD) WITH PROPOFOL;  Surgeon: [name], MD;  Location: [facility] ENDOSCOPY;  Service: Endoscopy;  Laterality: N/A;  . MITRAL VALVE ANNULOPLASTY  1996  . MITRAL VALVE REPAIR  1995  . PARTIAL GASTRECTOMY      Prior to Admission medications   Medication Sig Start Date End Date Taking? Authorizing Provider  amiodarone (PACERONE) 200 MG tablet TAKE 1 TABLET BY MOUTH ONCE DAILY 11/08/17   Wellington Hampshire, MD  cholecalciferol (VITAMIN D) 1000 UNITS tablet Take 1,000 Units by mouth daily.    [provider]  cyanocobalamin 100 MCG tablet Take 100 mcg by mouth daily.      [provider]  ferrous sulfate 325 (65 FE) MG tablet Take 325 mg by mouth 2 (two) times daily.    [provider]  finasteride (PROSCAR) 5 MG tablet Take 1 tablet (5 mg total) by mouth daily. 11/03/17   Tonia Ghent, MD  fish oil-omega-3 fatty acids 1000 MG capsule Take 1 g by mouth daily.    [provider]  hydroxypropyl methylcellulose (ISOPTO TEARS) 2.5 % ophthalmic solution Place 1 drop into both eyes every morning.    [provider]  Multiple Vitamin (MULTIVITAMIN) tablet Take 1 tablet by mouth daily.      [provider]  pantoprazole (PROTONIX) 40 MG tablet Take 1 tablet (40 mg total) by mouth 2 (two) times daily before a meal. 01/30/18 01/30/19  Tonia Ghent, MD  tamsulosin (FLOMAX) 0.4 MG CAPS capsule Take 1 capsule (0.4 mg total) by mouth daily. 10/19/17   Tonia Ghent, MD  vitamin C (ASCORBIC ACID) 500 MG tablet Take 500 mg by mouth daily.    [provider]    Allergies Atorvastatin and Statins  Family History  Problem Relation Age of Onset  . Cancer Father        lung  . Colon cancer Neg Hx   . Prostate cancer Neg Hx     Social History Social History   Tobacco Use  . Smoking status: Never Smoker  . Smokeless tobacco: Never Used  Substance Use Topics  . Alcohol use: Yes      Comment: socially, beer   . Drug use: No    Review of Systems  Constitutional: No fever/chills Eyes: No visual changes. ENT: No sore throat. Cardiovascular: Denies chest pain. Respiratory: Denies shortness of breath. Gastrointestinal: No abdominal pain.  No nausea, no vomiting.  No diarrhea.  No constipation. Genitourinary: Negative for dysuria. Musculoskeletal: Negative for back pain. Skin: Negative for rash. Neurological: Negative for headaches, focal weakness  ____________________________________________   PHYSICAL EXAM:  VITAL SIGNS: ED Triage Vitals  Enc Vitals Group     BP 03/06/18 1207 (!) 109/58     Pulse Rate 03/06/18 1207 66     Resp 03/06/18 1207 20     Temp 03/06/18 1207 98.2 F (36.8 C)     Temp Source 03/06/18 1207 Oral     SpO2 03/06/18 1207 100 %  Weight 03/06/18 1209 165 lb (74.8 kg)     Height 03/06/18 1209 5\' 11"  (1.803 m)     Head Circumference --      Peak Flow --      Pain Score 03/06/18 1209 0     Pain Loc --      Pain Edu? --      Excl. in [place]? --     Constitutional: Alert and oriented. Well appearing and in no acute distress. Eyes: Conjunctivae are normal. Head: Atraumatic. Nose: No congestion/rhinnorhea. Mouth/Throat: Mucous membranes are moist.  Oropharynx non-erythematous. Neck: No stridor.   Cardiovascular: Normal rate, regular rhythm. Grossly normal heart sounds.  Good peripheral circulation. Respiratory: Normal respiratory effort.  No retractions. Lungs CTAB. Gastrointestinal: Soft and nontender. No distention. No abdominal bruits. No CVA tenderness. Rectal: Stool is black but Hemoccult negative there are no masses or hemorrhoids musculoskeletal: No lower extremity tenderness nor edema.  No joint effusions. Neurologic:  Normal speech and language. No gross focal neurologic deficits are appreciated.  Skin:  Skin is warm, dry and intact. No rash noted. Psychiatric: Mood and affect are normal. Speech and behavior are  normal.  ____________________________________________   LABS (all labs ordered are listed, but only abnormal results are displayed)  Labs Reviewed  COMPREHENSIVE METABOLIC PANEL - Abnormal; Notable for the following components:      Result Value   Chloride 96 (*)    Glucose, Bld 103 (*)    Creatinine, Ser 1.39 (*)    GFR calc non Af Amer 46 (*)    GFR calc Af Amer 53 (*)    All other components within normal limits  CBC - Abnormal; Notable for the following components:   WBC 3.0 (*)    RBC 4.38 (*)    Hemoglobin 11.2 (*)    HCT 34.8 (*)    MCV 79.5 (*)    MCH 25.7 (*)    RDW 19.0 (*)    All other components within normal limits  TROPONIN I  CBC   ____________________________________________  EKG  EKG is pending ____________________________________________  RADIOLOGY  ED MD interpretation:   Official radiology report(s): No results found.  ____________________________________________   PROCEDURES  Procedure(s) performed:   Procedures  Critical Care performed:   ____________________________________________   INITIAL IMPRESSION / ASSESSMENT AND PLAN / ED COURSE  Patient is very lightheaded when he stands and is orthostatic by pressure.  We will try to give him some fluids and recheck his H&H.  I will sign the patient out to Dr. Karma Greaser.  Admission will depend on how he does after the fluids.    Clinical Course as of Mar 07 1547  Mon Mar 06, 2018  1441 Troponin I [PM]    Clinical Course User Index [PM] Nena Polio, MD     ____________________________________________   FINAL CLINICAL IMPRESSION(S) / ED DIAGNOSES  Final diagnoses:  Lightheaded     ED Discharge Orders    None       Note:  This document was prepared using Dragon voice recognition software and may include unintentional dictation errors.    Nena Polio, MD 03/06/18 [phone]

## 2018-03-06 NOTE — ED Provider Notes (Signed)
Patient has finished his fluids.  He feels much better when he stands up.  He is able to walk without getting woozy.  I offered him admission but he really could not decide whether to stay in or not.  After discussion with hospital physicians we decided to let him go home follow-up with his cardiologist and gastroenterologist and primary care doctor return if he is worse and will get him home physical therapy.   Nena Polio, MD 03/06/18 [phone]

## 2018-03-06 NOTE — Care Management (Signed)
RNCM called to ED to offer Home health PT to patient. RNCM spoke with patient and his son regarding home health agencies. [facility] has used [facility] in the past and said "they were nosey".  I explained that they would have to ask certain questions in case his PCP had questions for them in the event of an emergency. He said "okay".  I also presented [facility] referral (4th presentation in 6 months) and he agreed.  He states he will be discharged to home today.  RNCM did not see ETOH level on patient.

## 2018-03-06 NOTE — Telephone Encounter (Signed)
Pt left  vm stating he was bleeding again and he did not know if he needed appointment or go to ER. I attempted to call pt busy tone spoke with Son  And he will inform pt to to ER per Dr. Bonna Gains

## 2018-03-07 ENCOUNTER — Other Ambulatory Visit: Payer: Medicare Other

## 2018-03-10 ENCOUNTER — Encounter: Payer: Self-pay | Admitting: *Deleted

## 2018-03-10 ENCOUNTER — Other Ambulatory Visit: Payer: Self-pay | Admitting: *Deleted

## 2018-03-10 ENCOUNTER — Telehealth: Payer: Self-pay | Admitting: Family Medicine

## 2018-03-10 NOTE — Telephone Encounter (Signed)
Please give the order.  Thanks.   

## 2018-03-10 NOTE — Telephone Encounter (Signed)
Merry Proud, PT with [facility] advised.

## 2018-03-10 NOTE — Patient Outreach (Addendum)
[facility]) Care Management [place] Telephone Outreach  03/10/2018  [name] 02-Apr-1933 [id number]  Successful telephone outreach to [name], 84 y/o male referred to [place] by PCP office after patient experienced multiple recent hospital and ED visits.  Patient last experienced hospital admission April 15-18, 2019 for GI bleeding, and had ED visit March 06, 2018 for lightheadedness, weakness, and orthostatic BP readings.  Patient has history including, but not limited to, HTN/ HLD; GERD; CHF; persistent AF; recurrent GI bleeding with anemia.  HIPAA/ identity verified with patient today, and [place] services were discussed with patient, who provides verbal consent for [place] services today.  Today, patient reports that he "is doing pretty good," and he denies pain, new/ recent falls, and he sounds to be in no obvious/ apparent distress throughout phone call today.    Patient further reports:  Medications: -- Has all medicationsand takes as prescribed;denies questions about current medications, and states that his wife manages his medications for him, using weekly pill planner box; patient then takes from pill planner independently. -- denies issues with swallowing medications -- patient declines medication review today, stating that he "doesn't know about his medications," as his wife "handles" all medications, and he "just takes them" when he is "supposed to."  Home health [facility]) services: -- [facility] services currently in place, although patient can not tell me the name of the [facility] agency that is working with him -- confirms that [facility] services have begun; states that he "just got off the phone with the PT" and stated that he "told the PT to come visit" him next week -- discussed difference between [facility] CM and [facility] services with patient, and encouraged his active participation with [facility] services; patient verbalized understanding and  agreement  Provider appointments: -- states he had last PCP office visit "in late May;" also stated that he went to the "eye doctor" last week -- reports to see "new stomach doctor" soon, but does not remember the doctor's name nor the date of the visit, as he is not "near his calendar." -- Reports "goes to all doctor's appointments" that are scheduled  Safety/ Mobility/ Falls: -- denies new/ recent falls, but admits to remote falls in past -- assistive devices: uses cane and walker as necessary; states "doesn't have to use" when he is ambulating around his home; states uses "mainly when" he "goes out." -- general fall risks/ prevention education discussed with patient today  Holiday representative needs: -- currently denies community resource needs, stating supportive family members that assist with care needs as indicated -- family provides transportation for patient to all provider appointments, errands, etc  Advanced Directive (AD) Planning:   --reports currently has exisisting AD in place and declines desire to make changes today  Self-health management of recurrent GI bleeding, anemia, and CHF: -- states biggest health issue "is bleeding through my stomach" -- endorses eating "bland soft" diet and taking iron pills -- states "lost a lot of weight" with last hospital admission-- reports slowly gaining weight back -- monitors weights regularly, although "not every day;" states that his CHF "is not out of control," and that he "no longer even has to take fluid pills." -- plans to attend upcoming "new patient appointment" with GI provider -- states that he "can tell" when he is "running into trouble" with GI bleeding-- reports signs/ symptoms dark "black looking" BM along with "lighteheadedness;" denies both today  Patient denies further issues, concerns,  or problems today, and states that he needs to get off phone to tend to his dog.  I provided/ confirmed that patient has my direct  phone number, the main [place] CM office phone number, and the [facility] CM 24-hour nurse advice phone number should issues arise prior to next scheduled [place] outreach.  Discussed my general schedule with patient today, and encouraged him to contact [place] office should needs arise next week; we agreed that I would re-contact patient within next 2 weeks to schedule [place] home visit.  Plan:  Patient will take medications as prescribed and will attend all scheduled provider appointments  Patient will actively participate with home health services as ordered  Patient will use assistive devices for fall prevention as indicated  I will make patient's PCP aware of [facility] CM involvement in patient's care-- will send barrier letter  [place] outreach to continue with scheduled phone call in 10 days   [facility] CM Care Plan Problem One     Most Recent Value  Care Plan Problem One  Risk for hospital readmission related to/ as evidenced by multiple recent ED and hospital visits for GI bleeding and weakness  Role Documenting the Problem One  Care Management [place] for Problem One  Active  [facility] Long Term Goal   Over the next 31 days, patient will not experience hospital readmission, as evidenced by patient reporting and review of EMR during [place] outreach  [facility] Long Term Goal Start Date  03/10/18  Interventions for Problem One Long Term Goal  Discussed [place] program with patient,  discussed his overall state of health and recent hospital and [facility] visits[facility] program initiated  [facility] CM Short Term Goal #1   Over the next 30 days, patient will actively participate in home health services as ordered post-hospital discharge, as evidenced by patient reporting and collaboration with home health team as indicated, during [facility] RN CCM outreach  [facility] CM Short Term Goal #1 Start Date  03/10/18  Interventions for Short Term Goal #1  Discussed current  home health services in place for patient and encouraged his active participation in all home health services,  discussed difference between home health services and [place], RN, BSN, [employer] Coordinator [facility] Care Management  [phone]

## 2018-03-10 NOTE — Telephone Encounter (Signed)
Copied from [place] [id number]. Topic: Quick Communication - See Telephone Encounter >> Mar 10, 2018  3:42 PM Aurelio Brash B wrote: CRM for notification. See Telephone encounter for: 03/10/18. Merry Proud PT at [facility] called to let Dr Damita Dunnings know the pt has refused PT This week and request they start next week.  Merry Proud plans on starting on Monday 6/10 His contact number is [phone]

## 2018-03-14 ENCOUNTER — Encounter: Payer: Self-pay | Admitting: Cardiovascular Disease

## 2018-03-14 ENCOUNTER — Ambulatory Visit (INDEPENDENT_AMBULATORY_CARE_PROVIDER_SITE_OTHER): Payer: Medicare Other | Admitting: Cardiovascular Disease

## 2018-03-14 VITALS — BP 90/58 | HR 68 | Ht 71.0 in | Wt 157.8 lb

## 2018-03-14 DIAGNOSIS — I4819 Other persistent atrial fibrillation: Secondary | ICD-10-CM

## 2018-03-14 DIAGNOSIS — I059 Rheumatic mitral valve disease, unspecified: Secondary | ICD-10-CM | POA: Diagnosis not present

## 2018-03-14 DIAGNOSIS — I481 Persistent atrial fibrillation: Secondary | ICD-10-CM

## 2018-03-14 DIAGNOSIS — I6523 Occlusion and stenosis of bilateral carotid arteries: Secondary | ICD-10-CM

## 2018-03-14 DIAGNOSIS — I5022 Chronic systolic (congestive) heart failure: Secondary | ICD-10-CM

## 2018-03-14 DIAGNOSIS — I359 Nonrheumatic aortic valve disorder, unspecified: Secondary | ICD-10-CM | POA: Diagnosis not present

## 2018-03-14 NOTE — Patient Instructions (Signed)
Medication Instructions: Your physician recommends that you continue on your current medications as directed. Please refer to the Current Medication list given to you today.  If you need a refill on your cardiac medications before your next appointment, please call your pharmacy.   Follow-Up: Your physician wants you to follow-up in 3 months with Christell Faith, PA.   Thank you for choosing [facility] at [facility]!

## 2018-03-14 NOTE — Progress Notes (Signed)
Cardiology Office Note   Date:  03/14/2018   ID:  [name], DOB [birth year], MRN [id number]  PCP:  Tonia Ghent, MD  Cardiologist:   Kathlyn Sacramento, MD   Chief Complaint  Patient presents with  . other    6 month follow up. Meds reviewed by the patient's son verbally. Pt. c/o weight loss, decreased blood pressure, dizziness and weakness.       History of Present Illness: [name] is a 84 y.o. male who presents for a follow up visit. He has a h/o persistent atrial fibrillation and nonischemic cardiomyopathy.   His afib has been associated with worsening CHF in the past. He had multiple cardioversions. He has known history of mitral valve disease status post mitral valve repair in the 90s.  There is also history of bilateral carotid disease.  He has no history of coronary artery disease. He is on coumadin for stroke prevention.  He has history of recurrent GI bleed most recently in April 2019. Most recent echocardiogram in March 2018 showed an EF of 25 to 30%, mild aortic stenosis, moderate aortic insufficiency, mitral valve ring with moderate regurgitation, moderate tricuspid regurgitation and severe pulmonary hypertension with estimated systolic pressure of 71 mmHg. The patient was hospitalized in April with melena.  EGD showed single bleeding angiodysplastic lesion of the stomach which was treated with coagulation.  Warfarin was resumed before hospital discharge but he was rehospitalized with generalized weakness and was found to be hyponatremic with a sodium of 117 which improved with tolvaptan.  He had another hospitalization for upper GI bleed and EGD showed gastric ulcer felt to be secondary to previously treated AVM. He was most recently seen in May by Christell Faith.  Eplerenone and enalapril were held during hospitalization due to hypotension. He went to the emergency room on June 3 for dizziness.  His labs showed a hemoglobin of 11.2 which was better than before.   Creatinine was 1.39 which was mildly above his baseline.  He improved with hydration.  Subsequently, Toprol was discontinued.  Overall, he has not had any more bleeding.  He continues to be weak and is not back to baseline.  His weight is down 20 pounds since February.  No chest pain or worsening dyspnea.  Past Medical History:  Diagnosis Date  . Blind right eye   . Diaphragmatic hernia without mention of obstruction or gangrene   . Diverticulosis of colon (without mention of hemorrhage)   . Dizziness and giddiness   . Esophageal reflux   . Gastritis 12/18/08   EGD, 3 clips remaining (Dr. Vira Agar)  . Hypertension   . Iron deficiency anemia    a.  Secondary to recurrent GI bleeding  . Macular degeneration   . Nonischemic cardiomyopathy (HCC)    a.  TTE and 3/15: EF 35 to 40%, mild left ear, mitral valve repair with mild to moderate MR, mild to moderate TR, PASP 48; b.  TTE 3/18: EF 25 to 30%, not technically sufficient to allow for LV diastolic function, mild left ear possibly underestimated secondary to degree of CM, moderate AI calcified mitral annulus, moderate MR mod to sev dilated LA mod dilated RA moderate TR PASP 71  . Persistent atrial fibrillation ([place])    a.  Status post multiple cardioversions; b.  Previously on Coumadin though held secondary to recurrent GI bleed  . Personal history of peptic ulcer disease   . Pure hypercholesterolemia   . Sprain of neck   .  Unspecified hemorrhoids without mention of complication   . Unspecified sleep apnea   . Upper GI bleed 7/15-7/20/09   a.  Recurrent GI bleed dating back to at least 2009 with subsequent bleed in 2010, multiple GI bleeds in 01/2018    Past Surgical History:  Procedure Laterality Date  . APPENDECTOMY    . CARDIAC CATHETERIZATION  1996   [facility]  . CARDIAC CATHETERIZATION    . CARDIAC CATHETERIZATION    . CARDIOVERSION  12/17/2011   Procedure: CARDIOVERSION;  Surgeon: Hillary Bow, MD;  Location: [facility];  Service:  Cardiovascular;  Laterality: N/A;  . CATARACT EXTRACTION  10/13/08   left  . CHOLECYSTECTOMY    . ESOPHAGOGASTRODUODENOSCOPY Left 01/17/2018   Procedure: ESOPHAGOGASTRODUODENOSCOPY (EGD);  Surgeon: Virgel Manifold, MD;  Location: [facility] ENDOSCOPY;  Service: Endoscopy;  Laterality: Left;  . ESOPHAGOGASTRODUODENOSCOPY (EGD) WITH PROPOFOL N/A 01/04/2018   Procedure: ESOPHAGOGASTRODUODENOSCOPY (EGD) WITH PROPOFOL;  Surgeon: [name], MD;  Location: [facility] ENDOSCOPY;  Service: Endoscopy;  Laterality: N/A;  . MITRAL VALVE ANNULOPLASTY  1996  . MITRAL VALVE REPAIR  1995  . PARTIAL GASTRECTOMY       Current Outpatient Medications  Medication Sig Dispense Refill  . amiodarone (PACERONE) 200 MG tablet TAKE 1 TABLET BY MOUTH ONCE DAILY 90 tablet 3  . cholecalciferol (VITAMIN D) 1000 UNITS tablet Take 1,000 Units by mouth daily.    . cyanocobalamin 100 MCG tablet Take 100 mcg by mouth daily.      . ferrous sulfate 325 (65 FE) MG tablet Take 325 mg by mouth 2 (two) times daily.    . finasteride (PROSCAR) 5 MG tablet Take 1 tablet (5 mg total) by mouth daily. 90 tablet 3  . fish oil-omega-3 fatty acids 1000 MG capsule Take 1 g by mouth daily.    . hydroxypropyl methylcellulose (ISOPTO TEARS) 2.5 % ophthalmic solution Place 1 drop into both eyes every morning.    . Multiple Vitamin (MULTIVITAMIN) tablet Take 1 tablet by mouth daily.      . pantoprazole (PROTONIX) 40 MG tablet Take 1 tablet (40 mg total) by mouth 2 (two) times daily before a meal. 60 tablet 1  . tamsulosin (FLOMAX) 0.4 MG CAPS capsule Take 1 capsule (0.4 mg total) by mouth daily. 90 capsule 3  . vitamin C (ASCORBIC ACID) 500 MG tablet Take 500 mg by mouth daily.     No current facility-administered medications for this visit.     Allergies:   Atorvastatin and Statins    Social History:  The patient  reports that he has never smoked. He has never used smokeless tobacco. He reports that he drinks alcohol. He reports that he does not  use drugs.   Family History:  The patient's family history includes Cancer in his father.    ROS:  Please see the history of present illness.   Otherwise, review of systems are positive for none.   All other systems are reviewed and negative.    PHYSICAL EXAM: VS:  BP (!) 90/58 (BP Location: Right Arm, Patient Position: Sitting, Cuff Size: Normal)   Pulse 68   Ht 5\' 11"  (1.803 m)   Wt 157 lb 12 oz (71.6 kg)   SpO2 98%   BMI 22.00 kg/m  , BMI Body mass index is 22 kg/m. GEN: Well nourished, well developed, in no acute distress  HEENT: normal  Neck: no JVD, or masses.left carotid bruit  Cardiac: RRR; no rubs, or gallops,no edema .  2/6 crescendo decrescendo  systolic murmur in the aortic area which is mid peaking. Respiratory:  clear to auscultation bilaterally, normal work of breathing GI: soft, nontender, nondistended, + BS MS: no deformity or atrophy  Skin: warm and dry, no rash Neuro:  Strength and sensation are intact Psych: euthymic mood, full affect   EKG:  EKG is ordered today. The ekg ordered today demonstrates normal sinus rhythm with nonspecific IVCD.   Recent Labs: 10/20/2017: TSH 0.790 01/07/2018: B Natriuretic Peptide 622.0 03/06/2018: ALT 28; BUN 19; Creatinine, Ser 1.39; Hemoglobin 11.2; Platelets 237; Potassium 3.8; Sodium 135    Lipid Panel    Component Value Date/Time   CHOL 178 10/20/2017 1307   TRIG 75 10/20/2017 1307   HDL 59 10/20/2017 1307   CHOLHDL 5 02/08/2014 0953   VLDL 18.2 02/08/2014 0953   LDLCALC 104 (H) 10/20/2017 1307   LDLDIRECT 202.5 01/18/2011 0825      Wt Readings from Last 3 Encounters:  03/14/18 157 lb 12 oz (71.6 kg)  03/06/18 165 lb (74.8 kg)  02/22/18 153 lb (69.4 kg)      No flowsheet data found.    ASSESSMENT AND PLAN:  1.  Persistent atrial fibrillation: Maintaining in sinus rhythm with amiodarone.  Recurrent GI bleed in April.  I will consider resuming anticoagulation in 3 months from now as long as no further  bleeding episodes.  We should strongly consider Eliquis over warfarin.  2. Bilateral carotid disease: Most recent Doppler in February showed stable stenosis in the left side with known chronically occluded right ICA.  Repeat study in 1 year.  3. Chronic systolic heart failure: Most recent ejection fraction was 25 to 30%.  Unfortunately, not able to resume his heart failure medications due to symptomatic hypotension and significant orthostatic dizziness and hypotension.  We can try to resume some of these in few months.  I am going to cancel his echocardiogram for tomorrow given that he has not been on medical therapy.  4. Previous mitral valve repair with moderate aortic stenosis.  Degree of aortic stenosis might be underestimated due to low ejection fraction.  The plan is to repeat his echocardiogram later this year.   Disposition:   FU in 3  months  Signed,  Kathlyn Sacramento, MD  03/14/2018 3:51 PM    [place]

## 2018-03-15 ENCOUNTER — Telehealth: Payer: Self-pay | Admitting: Cardiovascular Disease

## 2018-03-15 ENCOUNTER — Other Ambulatory Visit: Payer: Medicare Other

## 2018-03-15 NOTE — Telephone Encounter (Signed)
I spoke with the patient's son, Harrie Jeans. He states he wanted to clarify what he and the patient heard at his office visit yesterday that the patient is not to be taking metoprolol. I advised Harrie Jeans that this is correct, the patient should not be on metoprolol due to hypotension.  Per Harrie Jeans, the patient was confused as his most recent discharge instructions were confusing and the pharmacy was automatically filling the metoprolol. I asked Harrie Jeans if the patient had been taking metoprolol prior to yesterday and he advised the patient "has been taking it all this time."  I advised Harrie Jeans to have the patient stop metoprolol and make sure the bottle is put up.  This will be readdressed at his next follow up with Dr. Fletcher Anon.  To Dr. Fletcher Anon as an Juluis [place].

## 2018-03-15 NOTE — Telephone Encounter (Signed)
I was under the impression that he was not taking Toprol after the most recent ED visit. If he has been taking Toprol 50 mg daily then we should not stop completely. Just decrease the dose to 25 mg daily.

## 2018-03-15 NOTE — Telephone Encounter (Signed)
I called the patient's son, Harrie Jeans, back and re-confirmed with him that the patient has been taking metoprolol succinate since his last ER visit. Per [place], this is correct. He is aware, per Dr. Fletcher Anon to decrease metoprolol succinate 50 mg- take 1/2 tablet (25 mg) once daily. He is currently with the patient. He sees that the tablets are scored. I inquired if the patient checks his BP at home. He confirms he does. I have asked him to have the patient continue to monitor his BP and is his SBP is <100 to call the office and let us know.  [place] voices understanding.

## 2018-03-15 NOTE — Telephone Encounter (Signed)
[name] patient son calling stating they were told during the visit Dr Fletcher Anon told them that patient is not to take any heart medication until patient is doing better He states while taking patient home patient was asking son to take him to [place] and get the metoprolol  He is calling for they are needing clarification on if patient is to be on or off all his heart medication   Please advise

## 2018-03-15 NOTE — Addendum Note (Signed)
Addended by: Anselm Pancoast on: 03/15/2018 03:29 PM   Modules accepted: Orders

## 2018-03-20 ENCOUNTER — Other Ambulatory Visit: Payer: Self-pay | Admitting: *Deleted

## 2018-03-20 ENCOUNTER — Encounter: Payer: Self-pay | Admitting: *Deleted

## 2018-03-20 NOTE — Patient Outreach (Signed)
[facility]) Care Management [place] Telephone Outreach  03/20/2018  JERL MUNYAN Jul 09, 1933 [id number]  Successful telephone outreach to [name], 84 y/o male referred to [place] by PCP office after patient experienced multiple recent hospital and ED visits.  Patient last experienced hospital admission April 15-18, 2019 for GI bleeding, and had ED visit March 06, 2018 for lightheadedness, weakness, and orthostatic BP readings.  Patient has history including, but not limited to, HTN/ HLD; GERD; CHF; persistent AF; recurrent GI bleeding with anemia.  HIPAA/ identity verified with patient today, and [place] services were again discussed with patient.  Today, patient reports that he "is doing better," and he denies pain, new/ recent falls, and he sounds to be in no obvious/ apparent distress throughout phone call.   Patient further reports:  Medications: -- denies questions about current medications, again states that his wife manages his medications for him, using weekly pill planner box; patient then takes from pill planner independently. -- patient again declines medication review today, stating that his wife is not present in home to review; [place] RN CM initial home visit was scheduled with patient today for next week, at his preference, when he believes his wife will be at home  Home health [facility]) services: -- [facility] services were previously active; patient reports today that the home health PT came to visit "one time," and discharged him, as he "is doing so well."  Patient verbalizes that he agrees with home health discharge, and that he "does not need" home health services. -- confirms that no other [place] disciplines were active, other than PT  Provider appointments: -- attended recent cardiology appointment March 14, 2018: denies questions/ concerns around this recent appointment, stated that he "got a good report," and does not go  back for 3 months, which will be a routine follow up appointment -- verbalizes accurate understanding that he is not currently taking anticoagulation for A-Fib; states "may be resumed" after next appointment, depending on "bleeding status." -- family continues providing transportation to all provider appointments  Self-health management of recurrent GI bleeding, anemia, AF, and CHF: -- again reports "lost a lot of weight" with recent hospital admissions-- reports slowly gaining weight back -- monitors and records blood pressures daily; positive reinforcement provided; encouraged patient to continue this practice for review at [place] initial home visit, which we scheduled today for next week -- discussed with patient signs/ symptoms of low pressure/ GI bleeding; patient denies recent concerning episodes low BP, dizziness/ lightheadedness, weakness, black tarry stools-- reports today that he believes he is "finally getting better."  Continues eating bland soft diet; reports recent BP readings 110-116/62-74 consistently with BP reading today of 114/69 -- again reports monitors weights regularly, although "not every day;" reports most recent weight of 161 lbs -- again reports plans to attend upcoming "new patient appointment" with GI provider  Patient denies further issues, concerns, or problems today. I provided/ confirmed that patient hasmy direct phone number, the main [facility] CM office phone number, and the [facility] CM 24-hour nurse advice phone number should issues arise prior to next scheduled [place] outreach.  Encouraged patient to contact me directly if needs, questions, issues, or concerns arise prior to next scheduled outreach; patient agreed to do so.  Plan:  Patient will take medications as prescribed and will attend all scheduled provider appointments  Patient will use assistive devices for fall prevention as indicated  Patient will continue to  monitor and record daily  BP's and weekly weights  [facility] Community CM outreach to continue with scheduled initial home visit next week  [facility] CM Care Plan Problem One     Most Recent Value  Care Plan Problem One  Risk for hospital readmission related to/ as evidenced by multiple recent ED and hospital visits for GI bleeding and weakness  Role Documenting the Problem One  Care Management [place] for Problem One  Active  TH[facility] Long Term Goal   Over the next 31 days, patient will not experience hospital readmission, as evidenced by patient reporting and review of EMR during [place] outreach  [facility] Long Term Goal Start Date  03/10/18  Interventions for Problem One Long Term Goal  Discussed with patient current clinical status, reviewed recent cardiology office visit with patient,  scheduled [facility] CCM initial home visit for next week  [facility] CM Short Term Goal #1   Over the next 30 days, patient will actively participate in home health services as ordered post-hospital discharge, as evidenced by patient reporting and collaboration with home health team as indicated, during [facility] RN CCM outreach  [facility] CM Short Term Goal #1 Start Date  03/10/18  [facility] CM Short Term Goal #1 Met Date  03/20/18 - Goal Met  Interventions for Short Term Goal #1  Discussed home health services with patient, who reports that [facility] PT made one home visit and discharged him,  confirmed that patient agrees with home health discharge  [facility] CM Short Term Goal #2   Over the next 9 days, patient will continue monitoring and recording daily blood pressure readings at home, as evidenced by review of recorded blood pressures at time of [facility] CCM initial home visit  TH[facility] CM Short Term Goal #2 Start Date  03/20/18  Interventions for Short Term Goal #2  Discussed patient's recently recorded home BP readings, and provided positive reinforcement that patient has been consistently monitoring and recording blood pressures at home,  encouraged patient to continue  recording daily BP readings for review at [place] initial home visit, schedule today for next week,  discussed with patient signs/ symptoms of low BP and confirmed that patient has had no concerning signs/ symptoms/ episodes low BP recently     Oneta Rack, RN, BSN, [place] Care Management  [phone]

## 2018-03-28 ENCOUNTER — Other Ambulatory Visit: Payer: Self-pay | Admitting: *Deleted

## 2018-03-28 ENCOUNTER — Encounter: Payer: Self-pay | Admitting: *Deleted

## 2018-03-28 NOTE — Patient Outreach (Addendum)
[facility]) Care Management [place] Telephone Outreach Care Coordination Patient cancelled initial home visit scheduled for today 03/28/2018  [name] 17-Aug-1933 [id number]  Initially unsuccessful telephoneoutreachto [name], 84 y/o male referred to [place] by PCP office after patient experienced multiple recent hospital and ED visits. Patient last experienced hospital admission April 15-18, 2019 for GI bleeding, and had ED visit March 06, 2018 for lightheadedness, weakness, and orthostatic BP readings.Patient has history including, but not limited to, HTN/ HLD; GERD; CHF; persistent AF; recurrent GI bleeding with anemia.   1:20 pm:  Call was placed today prior to previously scheduled initial home visit, as patient previously requested I call him when I was on the way to his home; called phone number patient had identified as his preferred number-- received automated outgoing voicemail message stating that patient's voice mail box is full; unable to leave patient return voice mail message, requesting call back.  Immediately attempted second call to phone number listed as patient's home number; person answering phone identified herself as patient's wife and verified HIPAA/ identity of patient; wife immediately reported that patient told her I may be calling today while in route to their home; patient's wife stated that they need to cancel today's appointment, as they currently have "a house full of people, replacing floors."  Noise in background indicated that there was obvious construction work occurring.  Patient's wife went outside where she could hear, and explained that patient is "asleep in the back bedroom," while construction work is being completed.  Explained to patient's wife that I was calling as patient requested, and was currently in route to their home.  Wife confirmed that our previously scheduled home visit would need to be  cancelled for today and re-scheduled in the future.  Wife shared that patient "has turned away everyone" that comes to his home to assist in his care; explained to patient's wife that I would need patient's verbal permission to speak with her about patient's care, and she verbalized understanding and agreement and stated he was unavailable to provide verbal permission today, as he is currently asleep.  Discussed basic role of [facility] Community RN CM with patient's wife, as well as general patient responsibilities and she agreed to share my message with patient when he wakes up; wife apologetic that patient did not notify me sooner of need to cancel today's previously scheduled home visit.  Encouraged wife to have patient maintain contact with me if he wishes to continue participating in [place] program, and she stated she would do.  Mutual agreement with patient's wife that I would re-attempt telephone outreach to patient later this week, and provided wife with my direct phone number as well.  Wife stated that she wished to place a complaint with the hospital "about patient's care" while he was recently hospitalized at [facility]-- wife declined sharing details of complaint with me today, stating that "there is too much going here right now to go into it;"  I provided her with the phone number for the [facility] of Patient Experience.  Plan:  Will re-attempt telephone outreach to patient later this week to re-schedule today's cancelled initial home visit and confirm patient's desire to continue participating in [place] program, as agreed with patient's wife today.  Oneta Rack, RN, BSN, [employer] [facility] Care Management  [phone]

## 2018-03-29 ENCOUNTER — Ambulatory Visit (INDEPENDENT_AMBULATORY_CARE_PROVIDER_SITE_OTHER): Payer: Medicare Other

## 2018-03-29 ENCOUNTER — Other Ambulatory Visit: Payer: Self-pay

## 2018-03-29 DIAGNOSIS — I428 Other cardiomyopathies: Secondary | ICD-10-CM | POA: Diagnosis not present

## 2018-03-31 ENCOUNTER — Encounter: Payer: Self-pay | Admitting: *Deleted

## 2018-03-31 ENCOUNTER — Other Ambulatory Visit: Payer: Self-pay | Admitting: *Deleted

## 2018-03-31 NOTE — Patient Outreach (Signed)
[place] [facility]) Care Management [facility] Community CM Telephone Outreach-- case Closure Patient declined further participation in [place] services  03/31/2018  [name] 13-Dec-1932 [id number]  Successful telephoneoutreachto [name], 84 y/o male referred to [place] by PCP office after patient experienced multiple recent hospital and ED visits. Patient last experienced hospital admission April 15-18, 2019 for GI bleeding, and had ED visit March 06, 2018 for lightheadedness, weakness, and orthostatic BP readings.Patient has history including, but not limited to, HTN/ HLD; GERD; CHF; persistent AF; recurrent GI bleeding with anemia. HIPAA/ identity verified with patient today.  Today, patient stated that he "is doing just fine," and we briefly discussed previously scheduled [place] initial home visit earlier this week, at which time patient cancelled.  Patient again asked for clarification of [place] services which were again discussed with patient.  Patient stated that he has decided that he does not wish to participate in [place] services, stating, "I am just not so sick where I need any one coming out to my home to visit me."  Patient acknowledges that he does not believe he will be able to commit to taking phone calls, maintaining communication with [place] and again states that he does not want to have anyone visit him at his home.  Plan:  Will close [place] program based on patient declining [facility] CM services today, and will make patient's PCP aware of case closure  [facility] CM Care Plan Problem One     Most Recent Value  Care Plan Problem One  Risk for hospital readmission related to/ as evidenced by multiple recent ED and hospital visits for GI bleeding and weakness  Role Documenting the Problem One  Care Management [place] for Problem One  Not Active  THN Long Term Goal   Over the next 31 days, patient will not  experience hospital readmission, as evidenced by patient reporting and review of EMR during [place] outreach  [facility] Long Term Goal Start Date  03/10/18  [facility] Long Term Goal Met Date  03/31/18  Interventions for Problem One Long Term Goal  Patient declined further participation in [place] CM Short Term Goal #2   Over the next 9 days, patient will continue monitoring and recording daily blood pressure readings at home, as evidenced by review of recorded blood pressures at time of [place] initial home visit  [facility] CM Short Term Goal #2 Start Date  03/20/18  [facility] CM Short Term Goal #2 Met Date  03/31/18  Interventions for Short Term Goal #2  patient declined further participation in [place], RN, BSN, [employer] Coordinator [facility] Care Management  [phone]

## 2018-04-05 ENCOUNTER — Encounter: Payer: Self-pay | Admitting: Gastroenterology

## 2018-04-05 ENCOUNTER — Other Ambulatory Visit: Payer: Self-pay

## 2018-04-05 ENCOUNTER — Ambulatory Visit: Payer: Medicare Other | Admitting: Gastroenterology

## 2018-04-05 VITALS — BP 105/58 | HR 82 | Ht 71.0 in | Wt 160.4 lb

## 2018-04-05 DIAGNOSIS — K2901 Acute gastritis with bleeding: Secondary | ICD-10-CM | POA: Diagnosis not present

## 2018-04-05 MED ORDER — PANTOPRAZOLE SODIUM 40 MG PO TBEC: 40.0000 mg | DELAYED_RELEASE_TABLET | Freq: Every day | ORAL | 3 refills | Status: DC

## 2018-04-05 NOTE — Progress Notes (Signed)
Jeremy Antigua, MD [address]  [place]  [place], [place] [address]  Main: [phone]  Fax: [phone]   Primary Care Physician: Tonia Ghent, MD  Primary Gastroenterologist:  Dr. Vonda Parsons  Chief Complaint  Patient presents with  . hopspital follow up    HPI: [name] is a 84 y.o. 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  He recently underwent an EGD on January 04, 2018 by Dr. 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  About 2 weeks later he was hospitalized again due to hematochezia.  He underwent an EGD with an adherent blood clot seen in the stomach, likely at the site of previous AVM treatment (due to his friable gastric mucosa), which was treated with APC.  Detailed findings below.  Findings:      The examined esophagus was normal.      One oozing superficial gastric ulcer with adherent clot was found in the       stomach. The lesion was 7 mm in largest dimension. Coagulation for       hemostasis using bipolar probe was successful. The blood clot was first       removed with water.      Patchy mildly friable mucosa with contact bleeding was found in the       entire examined stomach. The gastric mucosa was quite friable and       bled/oozed when it was cleaned gently with water. The oozing stopped       spontaneously.      The examined jejunum was normal. Normal bypass anatomy. Impression:           - Normal esophagus.                       - Oozing gastric ulcer with adherent clot. Treated with                        bipolar cautery.                       - Friable gastric mucosa.                       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 Given                        his friable mucosa and oozing even with water cleaning                        during the procedure, the superficial ulcer seen today                     is likely from the recently treated AVM which is oozing                        as it heals (due to friable tissue).                       - Normal examined jejunum.  Patient has been doing well post discharge.  Denies any abdominal pain, weight loss, hematochezia, melena.  Reports great appetite.  He  has had previous EGDs in 2009 by Dr. Vira Agar, and clips were placed at bleeding anastomosis site at that time.  Patient here with family today.  They think his last colonoscopy was anywhere from 2 to 5 years ago.  They do not think anything were found at that time, but states that they would not want another colonoscopy.  Current Outpatient Medications  Medication Sig Dispense Refill  . amiodarone (PACERONE) 200 MG tablet TAKE 1 TABLET BY MOUTH ONCE DAILY 90 tablet 3  . cholecalciferol (VITAMIN D) 1000 UNITS tablet Take 1,000 Units by mouth daily.    . cyanocobalamin 100 MCG tablet Take 100 mcg by mouth daily.      Marland Kitchen eplerenone (INSPRA) 25 MG tablet Take 25 mg by mouth daily.  11  . ferrous sulfate 325 (65 FE) MG tablet Take 325 mg by mouth 2 (two) times daily.    . finasteride (PROSCAR) 5 MG tablet Take 1 tablet (5 mg total) by mouth daily. 90 tablet 3  . fish oil-omega-3 fatty acids 1000 MG capsule Take 1 g by mouth daily.    . hydroxypropyl methylcellulose (ISOPTO TEARS) 2.5 % ophthalmic solution Place 1 drop into both eyes every morning.    . metoprolol succinate (TOPROL-XL) 50 MG 24 hr tablet Take 1/2 tablet (25 mg) by mouth once daily. Take with or immediately following a meal.     . Multiple Vitamin (MULTIVITAMIN) tablet Take 1 tablet by mouth daily.      . tamsulosin (FLOMAX) 0.4 MG CAPS capsule Take 1 capsule (0.4 mg total) by mouth daily. 90 capsule 3  . vitamin C (ASCORBIC ACID) 500 MG tablet Take 500 mg by mouth daily.    . pantoprazole (PROTONIX) 40 MG tablet Take 1 tablet (40 mg total) by mouth daily. 30 tablet 3   No current facility-administered medications for  this visit.     Allergies as of 04/05/2018 - Review Complete 04/05/2018  Allergen Reaction Noted  . Atorvastatin Other (See Comments) 11/03/2006  . Statins  02/15/2014    ROS:  General: Negative for anorexia, weight loss, fever, chills, fatigue, weakness. ENT: Negative for hoarseness, difficulty swallowing , nasal congestion. CV: Negative for chest pain, angina, palpitations, dyspnea on exertion, peripheral edema.  Respiratory: Negative for dyspnea at rest, dyspnea on exertion, cough, sputum, wheezing.  GI: See history of present illness. GU:  Negative for dysuria, hematuria, urinary incontinence, urinary frequency, nocturnal urination.  Endo: Negative for unusual weight change.    Physical Examination:   BP (!) 105/58   Pulse 82   Ht 5\' 11"  (1.803 m)   Wt 160 lb 6.4 oz (72.8 kg)   BMI 22.37 kg/m   General: Well-nourished, well-developed in no acute distress.  Eyes: No icterus. Conjunctivae pink. Mouth: Oropharyngeal mucosa moist and pink , no lesions erythema or exudate. Neck: Supple, Trachea midline Abdomen: Bowel sounds are normal, nontender, nondistended, no hepatosplenomegaly or masses, no abdominal bruits or hernia , no rebound or guarding.   Extremities: No lower extremity edema. No clubbing or deformities. Neuro: Alert and oriented x 3.  Grossly intact. Skin: Warm and dry, no jaundice.   Psych: Alert and cooperative, normal mood and affect.   Labs: CMP     Component Value Date/Time   NA 135 03/06/2018 1211   NA 133 (L) 10/20/2017 1307   K 3.8 03/06/2018 1211   CL 96 (L) 03/06/2018 1211   CO2 28 03/06/2018 1211   GLUCOSE 103 (H) 03/06/2018 1211   BUN 19  03/06/2018 1211   BUN 5 (L) 10/20/2017 1307   CREATININE 1.39 (H) 03/06/2018 1211   CALCIUM 9.2 03/06/2018 1211   PROT 7.5 03/06/2018 1211   PROT 7.2 10/20/2017 1307   ALBUMIN 3.9 03/06/2018 1211   ALBUMIN 4.4 10/20/2017 1307   AST 35 03/06/2018 1211   ALT 28 03/06/2018 1211   ALKPHOS 60 03/06/2018  1211   BILITOT 0.6 03/06/2018 1211   BILITOT 0.8 10/20/2017 1307   GFRNONAA 46 (L) 03/06/2018 1211   GFRAA 53 (L) 03/06/2018 1211   Lab Results  Component Value Date   WBC 3.0 (L) 03/06/2018   HGB 11.2 (L) 03/06/2018   HCT 34.8 (L) 03/06/2018   MCV 79.5 (L) 03/06/2018   PLT 237 03/06/2018    Imaging Studies: No results found.  Assessment and Plan:   STANDLEY BARGO is a 84 y.o. y/o male here for posthospitalization follow-up for GI bleed  No signs of GI bleeding at this time Hemoglobin improved since hospitalization 11.2 on last check GI bleed occurred in the setting of friable mucosa in his stomach, leading to oozing and bleeding from the cauterization of the AVM seen on January 04, 2018 This was treated with APC on last EGD, and bleeding has since resolved with the help of PPI  Given his friable mucosa, and gastric mucosal bleeding even with application of water to the area with the water jet itself, it is best if he remains on PPI to prevent further episodes of bleeding. In addition, his cardiologist, Dr. Tyrell Antonio note, mentions that the plan on resuming antiplatelets or anticoagulant in the near future.  In that setting, PPI would help in this elderly gentleman as well.  (Risks of PPI use were discussed with patient including bone loss, C. Diff diarrhea, pneumonia, infections, CKD, electrolyte abnormalities. Pt. Verbalizes understanding and chooses to continue the medication.)  No indication for repeat EGD, given source of gastric ulcer was from cauterization artifact from the AVM itself  He is on oral iron replacement He states his primary care physician is monitoring this.  Last check in May 2019, showed improved iron at 48.7 Iron deficiency is likely due to his blood work to anatomy If iron replacement becomes a problem, or levels do not increase as expected, primary care provider can consider referral to hematology for evaluation of IV iron   No previous colonoscopy  records available, but patient and family state he had one in the last 2 to 5 years in [place] They specifically state, they do not want another colonoscopy, and understand the risk of underlying malignancy.  Patient states he has lived a long good life, and does not want to undergo colonoscopy unless he is bleeding.  USPSTF did not recommend screening colonoscopy after 84 years of age  However, if polyps were found in his previous colonoscopy, polyp surveillance will be another indication for repeat colonoscopy.  Will need to obtain records to review if a colonoscopy is indicated.  However, patient and family state they do not want a colonoscopy, even for polyps.  We will thus suspect their wishes.  Dr [name]

## 2018-04-09 ENCOUNTER — Telehealth: Payer: Self-pay | Admitting: Family Medicine

## 2018-04-09 NOTE — Telephone Encounter (Signed)
Notify pt/son.  Needs repeat labs done.  Orders in EMR.  Thanks.

## 2018-04-10 NOTE — Telephone Encounter (Signed)
Left detailed message on voicemail of son (Tim)  

## 2018-05-08 DIAGNOSIS — H353221 Exudative age-related macular degeneration, left eye, with active choroidal neovascularization: Secondary | ICD-10-CM | POA: Diagnosis not present

## 2018-05-22 ENCOUNTER — Telehealth: Payer: Self-pay | Admitting: Physician Assistant

## 2018-05-22 NOTE — Telephone Encounter (Signed)
Pt states he has stopped taking Warfarin due to being in the hospital. Please call to discuss. States his lips and his right hand has gotten numb since a couple weeks ago.

## 2018-05-22 NOTE — Telephone Encounter (Addendum)
Attempted to contact pt to schedule new coumadin appt w/ me. Na/nm. Will route to scheduling to have them attempt to contact pt. Has pt been approved by MD to resume coumadin? Per Arida's last ov 03/14/18: "I will consider resuming anticoagulation in 3 months from now as long as no further bleeding episodes.  We should strongly consider Eliquis over warfarin."

## 2018-05-22 NOTE — Telephone Encounter (Signed)
No answer/No voicemail on home number, called mobile number and reached son Octavia Bruckner but he is not aware and requested that I reach back out.

## 2018-05-22 NOTE — Telephone Encounter (Signed)
He is not established in our coumadin clinic - looks like he is checked through his PCP.

## 2018-05-22 NOTE — Telephone Encounter (Signed)
Spoke with patient and he reports that since his hospital stay he has been off of coumadin. He was calling to see if he could start back on the coumadin here with our office. Advised that I would send this information over to our clinic and would have them review and reach out to him. He was appreciative for the information with no further questions at this time.

## 2018-05-23 NOTE — Telephone Encounter (Signed)
Attempted to call patient. vm was full.  Lm with patient son

## 2018-05-24 NOTE — Telephone Encounter (Signed)
Spoke w/ pt's son,Tim. Advised him that if pt has resumed coumadin, as it looks pt has been taking sporadically, to stop until it can be monitored & have pt keep appt w/ Ryan on 8/27 and discuss w/ him at that time how to proceed.  He is agreeable and will call back w/ any questions or concerns.

## 2018-05-24 NOTE — Telephone Encounter (Signed)
Pt has appt w/ Thurmond Butts on 8/27. I recommend he speak w/ him at that time about restarting anticoagulation and which med he should be on.  I'll be happy to see him if he remains on coumadin.

## 2018-05-30 ENCOUNTER — Ambulatory Visit: Payer: Medicare Other | Admitting: Physician Assistant

## 2018-05-30 ENCOUNTER — Encounter: Payer: Self-pay | Admitting: Physician Assistant

## 2018-05-30 VITALS — BP 112/60 | HR 87 | Ht 71.0 in | Wt 165.0 lb

## 2018-05-30 DIAGNOSIS — I059 Rheumatic mitral valve disease, unspecified: Secondary | ICD-10-CM | POA: Diagnosis not present

## 2018-05-30 DIAGNOSIS — I5022 Chronic systolic (congestive) heart failure: Secondary | ICD-10-CM | POA: Diagnosis not present

## 2018-05-30 DIAGNOSIS — I6523 Occlusion and stenosis of bilateral carotid arteries: Secondary | ICD-10-CM

## 2018-05-30 DIAGNOSIS — I428 Other cardiomyopathies: Secondary | ICD-10-CM | POA: Diagnosis not present

## 2018-05-30 DIAGNOSIS — I350 Nonrheumatic aortic (valve) stenosis: Secondary | ICD-10-CM | POA: Diagnosis not present

## 2018-05-30 DIAGNOSIS — Z8719 Personal history of other diseases of the digestive system: Secondary | ICD-10-CM

## 2018-05-30 DIAGNOSIS — I481 Persistent atrial fibrillation: Secondary | ICD-10-CM | POA: Diagnosis not present

## 2018-05-30 DIAGNOSIS — I4819 Other persistent atrial fibrillation: Secondary | ICD-10-CM

## 2018-05-30 NOTE — Progress Notes (Signed)
Cardiology Office Note Date:  05/30/2018  Patient ID:  [name], [name] 09/24/1933, MRN [id number] PCP:  Tonia Ghent, MD  Cardiologist:  Dr. Fletcher Anon, MD    Chief Complaint: Follow up  History of Present Illness: Jeremy PHEGLEY is a 84 y.o. male with history of persistent Afib previously on Coumadin with history of some fluctuations in INR status post multiple cardioversions in the past, chronic systolic CHF secondary to NICM, mitral valve regurgitation status post annuloplasty in 1996, recurrent GI bleeds dating back to at least 2009, previously undergoing clipping x3 in 2010 secondary to recurrent GI bleed with most recent GI bleed x 2 in 01/2018, iron deficiency anemia with a baseline hemoglobin approximately 9-12, diverticulosis, GERD, bilateral carotid artery disease with known occluded right internal carotid artery and stable moderate left carotid artery stenosis from ultrasound in 11/2017, chronic hyponatremia previously on salt tabs, hyperlipidemia, and sleep apnea who presents for follow up of his Afib and NICM.  His Afib has been known to be associated with CHF exacerbations in the past leading to his multiple cardioversions. Prior echocardiogram in 12/2013 showed an EF of 35 to 40%, mild aortic stenosis, mitral valve repair with mild to moderate mitral regurgitation, mild to moderate tricuspid regurgitation, PASP 48 mmHg. Echocardiogram from 12/2016 showed an EF of 25 to 30%, not technically sufficient to allow for LV diastolic function, mild aortic stenosis though this may have been underestimated given the degree of his cardiomyopathy, moderate aortic insufficiency, calcified mitral annulus with a mitral ring noted, moderate mitral regurgitation, moderately to severely dilated left atrium, moderately dilated right atrium, moderate tricuspid regurgitation, PASP 71 mmHg. He was admitted to the hospital in 01/2018 with melena. EGD showed single bleeding angiodysplastic lesion of the  stomach which was treated with coagulation. Warfarin was resumed before hospital discharge but he was rehospitalized with generalized weakness and was found to be hyponatremic with a sodium of 117 which improved with Tolvaptan. He had another hospitalization in 01/2018 for upper GI bleed and EGD showed gastric ulcer felt to be secondary to previously treated AVM. He was seen in the ED on 6/3 with dizziness that improved with hydration. Escalation/continuation of his heart failure medications has been limited by hypotension and orthostasis. Echo on 03/29/2018 showed an EF of 25-30%, diffuse HK, not technically sufficient to allow for LV diastolic function, mild to moderate AS with a reduced peak velocity and gradient due to low cardiac output, moderate AI, mildly dilated aortic root, mildly dilated ascending aorta, prior surgical repair of the mitral valve was noted without evidence of stenosis with mild to moderate mitral regurgitation, severely dilated LA, mildly reduced RVSF, mildly dilated RA, moderate TR, PASP ~ 50 mmHg. In the interim, there was report the patient had self resumed Coumadin, with sporadic compliance. There was some confusion regarding the patient's Toprol after he was last seen and it looks like he had actually never stopped taking this medication. He was advised to decrease his dose to 25 mg daily.   He comes in accompanied by his son today. He reports he is doing well and has not felt this good in several months. The son feels like the patient is doing well also. He has not been weighing himself at home lately. He has been compliant with Toprol XL 25 mg daily. Blood pressure has been running in the 628B to 151V systolic. He reports having much more energy. He is no longer driving. His weight is up 8 pounds from his  last office visit, though he and his son do not think this is related to fluid and more so related to diet as he is spending more time at home now. It is noted on his medication  list today that he is also taking eplerenone, though this was not listed as an active medication at his last office visit and I do not see where it may have been initiated. He has not had any falls since he was last seen. No BRBPR or melena. No lower extremity swelling, cough, PND, orthopnea, or early satiety.   Past Medical History:  Diagnosis Date  . Blind right eye   . Diaphragmatic hernia without mention of obstruction or gangrene   . Diverticulosis of colon (without mention of hemorrhage)   . Dizziness and giddiness   . Esophageal reflux   . Gastritis 12/18/08   EGD, 3 clips remaining (Dr. Vira Agar)  . Hypertension   . Iron deficiency anemia    a.  Secondary to recurrent GI bleeding  . Macular degeneration   . Nonischemic cardiomyopathy (HCC)    a.  TTE and 3/15: EF 35 to 40%, mild left ear, mitral valve repair with mild to moderate MR, mild to moderate TR, PASP 48; b.  TTE 3/18: EF 25 to 30%, not technically sufficient to allow for LV diastolic function, mild left ear possibly underestimated secondary to degree of CM, moderate AI calcified mitral annulus, moderate MR mod to sev dilated LA mod dilated RA moderate TR PASP 71  . Persistent atrial fibrillation ([facility])    a.  Status post multiple cardioversions; b.  Previously on Coumadin though held secondary to recurrent GI bleed  . Personal history of peptic ulcer disease   . Pure hypercholesterolemia   . Sprain of neck   . Unspecified hemorrhoids without mention of complication   . Unspecified sleep apnea   . Upper GI bleed 7/15-7/20/09   a.  Recurrent GI bleed dating back to at least 2009 with subsequent bleed in 2010, multiple GI bleeds in 01/2018    Past Surgical History:  Procedure Laterality Date  . APPENDECTOMY    . CARDIAC CATHETERIZATION  1996   [facility]  . CARDIAC CATHETERIZATION    . CARDIAC CATHETERIZATION    . CARDIOVERSION  12/17/2011   Procedure: CARDIOVERSION;  Surgeon: Hillary Bow, MD;  Location: [facility];  Service:  Cardiovascular;  Laterality: N/A;  . CATARACT EXTRACTION  10/13/08   left  . CHOLECYSTECTOMY    . ESOPHAGOGASTRODUODENOSCOPY Left 01/17/2018   Procedure: ESOPHAGOGASTRODUODENOSCOPY (EGD);  Surgeon: Virgel Manifold, MD;  Location: [facility] ENDOSCOPY;  Service: Endoscopy;  Laterality: Left;  . ESOPHAGOGASTRODUODENOSCOPY (EGD) WITH PROPOFOL N/A 01/04/2018   Procedure: ESOPHAGOGASTRODUODENOSCOPY (EGD) WITH PROPOFOL;  Surgeon: [name], MD;  Location: [facility] ENDOSCOPY;  Service: Endoscopy;  Laterality: N/A;  . MITRAL VALVE ANNULOPLASTY  1996  . MITRAL VALVE REPAIR  1995  . PARTIAL GASTRECTOMY      Current Meds  Medication Sig  . amiodarone (PACERONE) 200 MG tablet TAKE 1 TABLET BY MOUTH ONCE DAILY  . cholecalciferol (VITAMIN D) 1000 UNITS tablet Take 1,000 Units by mouth daily.  . cyanocobalamin 100 MCG tablet Take 100 mcg by mouth daily.    Marland Kitchen eplerenone (INSPRA) 25 MG tablet Take 25 mg by mouth daily.  . ferrous sulfate 325 (65 FE) MG tablet Take 325 mg by mouth 2 (two) times daily.  . finasteride (PROSCAR) 5 MG tablet Take 1 tablet (5 mg total) by mouth daily.  Marland Kitchen  fish oil-omega-3 fatty acids 1000 MG capsule Take 1 g by mouth daily.  . hydroxypropyl methylcellulose (ISOPTO TEARS) 2.5 % ophthalmic solution Place 1 drop into both eyes every morning.  . metoprolol succinate (TOPROL-XL) 50 MG 24 hr tablet Take 1/2 tablet (25 mg) by mouth once daily. Take with or immediately following a meal.   . Multiple Vitamin (MULTIVITAMIN) tablet Take 1 tablet by mouth daily.    . pantoprazole (PROTONIX) 40 MG tablet Take 1 tablet (40 mg total) by mouth daily.  . tamsulosin (FLOMAX) 0.4 MG CAPS capsule Take 1 capsule (0.4 mg total) by mouth daily.  . vitamin C (ASCORBIC ACID) 500 MG tablet Take 500 mg by mouth daily.    Allergies:   Atorvastatin and Statins   Social History:  The patient  reports that he has never smoked. He has never used smokeless tobacco. He reports that he drinks alcohol. He reports  that he does not use drugs.   Family History:  The patient's family history includes Cancer in his father.  ROS:   Review of Systems  Constitutional: Positive for malaise/fatigue. Negative for chills, diaphoresis, fever and weight loss.       Much improved fatigue and weakness  HENT: Negative for congestion.   Eyes: Negative for discharge and redness.  Respiratory: Negative for cough, hemoptysis, sputum production, shortness of breath and wheezing.   Cardiovascular: Negative for chest pain, palpitations, orthopnea, claudication, leg swelling and PND.  Gastrointestinal: Negative for abdominal pain, blood in stool, heartburn, melena, nausea and vomiting.  Genitourinary: Negative for hematuria.  Musculoskeletal: Negative for falls and myalgias.  Skin: Negative for rash.  Neurological: Negative for dizziness, tingling, tremors, sensory change, speech change, focal weakness, loss of consciousness and weakness.  Endo/Heme/Allergies: Does not bruise/bleed easily.  Psychiatric/Behavioral: Negative for substance abuse. The patient is not nervous/anxious.   All other systems reviewed and are negative.    PHYSICAL EXAM:  VS:  BP 112/60 (BP Location: Left Arm, Patient Position: Sitting, Cuff Size: Normal)   Pulse 87   Ht 5\' 11"  (1.803 m)   Wt 165 lb (74.8 kg)   BMI 23.01 kg/m  BMI: Body mass index is 23.01 kg/m.  Physical Exam  Constitutional: He is oriented to person, place, and time. He appears well-developed and well-nourished.  HENT:  Head: Normocephalic and atraumatic.  Eyes: Right eye exhibits no discharge. Left eye exhibits no discharge.  Neck: Normal range of motion. No JVD present.  Cardiovascular: Normal rate, regular rhythm, S1 normal and S2 normal. Exam reveals no distant heart sounds, no friction rub, no midsystolic click and no opening snap.  Murmur heard.  Harsh midsystolic murmur is present with a grade of 2/6 at the upper right sternal border radiating to the  neck. Pulses:      Posterior tibial pulses are 2+ on the right side, and 2+ on the left side.  Pulmonary/Chest: Effort normal and breath sounds normal. No respiratory distress. He has no decreased breath sounds. He has no wheezes. He has no rales. He exhibits no tenderness.  Abdominal: Soft. He exhibits no distension. There is no tenderness.  Musculoskeletal: He exhibits no edema.  Neurological: He is alert and oriented to person, place, and time.  Skin: Skin is warm and dry. No cyanosis. Nails show no clubbing.  Psychiatric: He has a normal mood and affect. His speech is normal and behavior is normal. Judgment and thought content normal.     EKG:  Was ordered and interpreted by me today.  Shows NSR, 87 bpm, junctional escape beat, nonspecific IVCD  Recent Labs: 10/20/2017: TSH 0.790 01/07/2018: B Natriuretic Peptide 622.0 03/06/2018: ALT 28; BUN 19; Creatinine, Ser 1.39; Hemoglobin 11.2; Platelets 237; Potassium 3.8; Sodium 135  10/20/2017: Cholesterol, Total 178; HDL 59; LDL Calculated 104; Triglycerides 75   CrCl cannot be calculated (Patient's most recent lab result is older than the maximum 21 days allowed.).   Wt Readings from Last 3 Encounters:  05/30/18 165 lb (74.8 kg)  04/05/18 160 lb 6.4 oz (72.8 kg)  03/14/18 157 lb 12 oz (71.6 kg)     Other studies reviewed: Additional studies/records reviewed today include: summarized above  ASSESSMENT AND PLAN:  1. Persistent Afib: Currently, he remains in sinus rhythm with amiodarone 200 mg daily as well as Toprol XL 25 mg daily. Check a CBC. If his HGB remains stable and he has no further GI bleeding concerns we will need to revisit starting anticoagulation in mid 06/2018. I will discuss this with his primary cardiologist for his input as well. Would consider Eliquis over Coumadin.   2. Chronic systolic CHF secondary to NICM: He does not appear grossly volume up at this time. His weight is up 8 pounds today compared to his 03/2018 office  visit, though he feels like this is secondary to calorie intake. He has been tolerating Toprol XL 25 mg daily without issues. His medication list indicates he is also taking eplerenone 25 mg daily, though this was not on his medication list at his last office visit and I cannot see where this was started. His son will go to the patient's house and check to see if the patient is actually taking this mediation. If so, we will continue it; and if not, we will not start it as his son is worried about lowering the patient's BP too much.   3. Mitral valve repair with at moderate aortic stenosis: The degree of his aortic stenosis certainly may be underestimated due to low cardiac output. Schedule repeat echocardiogram for 09/2018.   4. Bilateral carotid artery disease: He is due for repeat carotid artery ultrasound in 11/2018. The order is in [place].   5. Recurrent GI bleeds: Check CBC.  Disposition: F/u with Dr. Fletcher Anon or an APP in late 09/2018.    Current medicines are reviewed at length with the patient today.  The patient did not have any concerns regarding medicines.  Signed, Christell Faith, PA-C 05/30/2018 3:52 PM     [address] [place], [place] [id number] [phone]

## 2018-05-30 NOTE — Patient Instructions (Addendum)
Medication Instructions: - Your physician recommends that you continue on your current medications as directed. Please refer to the Current Medication list given to you today.  - please call the office at [phone] to verify if you are taking Eplerenone (inspra)  Labwork: - Your physician recommends that you have lab work today: CBC    Procedures/Testing: - Your physician has requested that you have an echocardiogram- in December 2019. Echocardiography is a painless test that uses sound waves to create images of your heart. It provides your doctor with information about the size and shape of your heart and how well your heart's chambers and valves are working. This procedure takes approximately one hour. There are no restrictions for this procedure.  Follow-Up: - Your physician recommends that you schedule a follow-up appointment in: December with Dr. Fletcher Anon (after echocardiogram)   Any Additional Special Instructions Will Be Listed Below (If Applicable).     If you need a refill on your cardiac medications before your next appointment, please call your pharmacy.

## 2018-05-31 ENCOUNTER — Telehealth: Payer: Self-pay | Admitting: Cardiovascular Disease

## 2018-05-31 DIAGNOSIS — I5022 Chronic systolic (congestive) heart failure: Secondary | ICD-10-CM

## 2018-05-31 DIAGNOSIS — Z79899 Other long term (current) drug therapy: Secondary | ICD-10-CM

## 2018-05-31 DIAGNOSIS — I10 Essential (primary) hypertension: Secondary | ICD-10-CM

## 2018-05-31 LAB — CBC WITH DIFFERENTIAL/PLATELET
BASOS: 2 %
Basophils Absolute: 0.1 10*3/uL (ref 0.0–0.2)
EOS (ABSOLUTE): 0.1 10*3/uL (ref 0.0–0.4)
Eos: 2 %
Hematocrit: 28.8 % — ABNORMAL LOW (ref 37.5–51.0)
Hemoglobin: 9.3 g/dL — ABNORMAL LOW (ref 13.0–17.7)
Immature Grans (Abs): 0 10*3/uL (ref 0.0–0.1)
Immature Granulocytes: 0 %
Lymphocytes Absolute: 1.4 10*3/uL (ref 0.7–3.1)
Lymphs: 41 %
MCH: 24.1 pg — ABNORMAL LOW (ref 26.6–33.0)
MCHC: 32.3 g/dL (ref 31.5–35.7)
MCV: 75 fL — AB (ref 79–97)
MONOS ABS: 0.4 10*3/uL (ref 0.1–0.9)
Monocytes: 13 %
Neutrophils Absolute: 1.4 10*3/uL (ref 1.4–7.0)
Neutrophils: 42 %
PLATELETS: 247 10*3/uL (ref 150–450)
RBC: 3.86 x10E6/uL — ABNORMAL LOW (ref 4.14–5.80)
RDW: 16.2 % — AB (ref 12.3–15.4)
WBC: 3.4 10*3/uL (ref 3.4–10.8)

## 2018-05-31 NOTE — Telephone Encounter (Signed)
To Thurmond Butts to review.

## 2018-05-31 NOTE — Telephone Encounter (Signed)
Ok to continue eplerenone at current dose. Lets have him come in for a bmet as we did not check this at his visit to ensure renal function and potassium are ok now that we know he is in fact taking this medication.

## 2018-05-31 NOTE — Telephone Encounter (Signed)
Pt c/o medication issue:  1. Name of Medication: eplerenone (INSPRA)  2. How are you currently taking this medication (dosage and times per day)? 1 25MG  daily  3. Are you having a reaction (difficulty breathing--STAT)? No   4. What is your medication issue? Patient states that at appointment yesterday with R. Dunn we wanted to know if he was still taking this medication.  He is calling to confirm that yes he is taking it and his blood pressure has been good, today was 114/68  Please call with any questions or concerns

## 2018-06-01 NOTE — Telephone Encounter (Signed)
Patient called back regarding labs results and phone call.  Lab results given, see result note. Patient agreed to continue eplerenone and to get the BMET at his earliest convenience which will probable be this Monday. Patient does not drive and relies on family/friends for transportation. He requested to have the BMET at the [facility] in [place] as this is closer to his home.  Called [facility] and got fax number to that location [phone].

## 2018-06-01 NOTE — Telephone Encounter (Signed)
Lab order for BMET signed by Christell Faith, PA-C and faxed to [facility].

## 2018-06-01 NOTE — Telephone Encounter (Signed)
Left voicemail message to call back  

## 2018-06-06 DIAGNOSIS — I10 Essential (primary) hypertension: Secondary | ICD-10-CM | POA: Diagnosis not present

## 2018-06-06 DIAGNOSIS — Z79899 Other long term (current) drug therapy: Secondary | ICD-10-CM | POA: Diagnosis not present

## 2018-06-06 DIAGNOSIS — I5022 Chronic systolic (congestive) heart failure: Secondary | ICD-10-CM | POA: Diagnosis not present

## 2018-06-07 LAB — BASIC METABOLIC PANEL
BUN / CREAT RATIO: 8 — AB (ref 10–24)
BUN: 7 mg/dL — AB (ref 8–27)
CHLORIDE: 97 mmol/L (ref 96–106)
CO2: 22 mmol/L (ref 20–29)
Calcium: 8.9 mg/dL (ref 8.6–10.2)
Creatinine, Ser: 0.88 mg/dL (ref 0.76–1.27)
GFR calc non Af Amer: 79 mL/min/{1.73_m2} (ref >59)
GFR, EST AFRICAN AMERICAN: 92 mL/min/{1.73_m2} (ref >59)
GLUCOSE: 88 mg/dL (ref 65–99)
Potassium: 4.3 mmol/L (ref 3.5–5.2)
SODIUM: 134 mmol/L (ref 134–144)

## 2018-06-07 NOTE — Telephone Encounter (Signed)
Labs completed. See result note.

## 2018-07-10 DIAGNOSIS — H353221 Exudative age-related macular degeneration, left eye, with active choroidal neovascularization: Secondary | ICD-10-CM | POA: Diagnosis not present

## 2018-07-17 ENCOUNTER — Other Ambulatory Visit: Payer: Self-pay | Admitting: Family Medicine

## 2018-07-17 NOTE — Telephone Encounter (Signed)
According to most recent office visit with [name], [name] is supposed to be on Toprol 25 mg once daily (half a tablet of 50 mg daily and not a full tablet).  Given that this is extended release medication, it is better to send him a prescription for the 25 mg tablet instead of breaking the 50 mg in half.

## 2018-07-18 MED ORDER — METOPROLOL SUCCINATE ER 25 MG PO TB24: 25.0000 mg | ORAL_TABLET | Freq: Every day | ORAL | 2 refills | Status: DC

## 2018-07-18 NOTE — Telephone Encounter (Signed)
Rx for Toprol XL 25 mg by mouth once a day to pharmacy.

## 2018-07-20 ENCOUNTER — Other Ambulatory Visit: Payer: Self-pay | Admitting: Family Medicine

## 2018-07-24 ENCOUNTER — Telehealth: Payer: Self-pay | Admitting: *Deleted

## 2018-07-24 NOTE — Telephone Encounter (Signed)
Nikki at [facility] advised.

## 2018-07-24 NOTE — Telephone Encounter (Signed)
Copied from [facility] [phone]. Topic: General - Other >> Jul 24, 2018  2:07 PM Yvette Rack wrote: Reason for CRM: Lexine Baton with [facility] called to advise that they received Rx for metoprolol succinate (TOPROL XL) 25 MG 24 hr tablet on 07/18/18 and then on 07/20/18  they received Rx for metoprolol succinate (TOPROL-XL) 50 MG 24 hr tablet. Lexine Baton would like to know which Rx should be filled for pt. Cb# [phone]

## 2018-07-24 NOTE — Telephone Encounter (Signed)
25mg  tab.  rx per cardiology. Please notify pharmacy.  Thanks.

## 2018-09-11 DIAGNOSIS — H353221 Exudative age-related macular degeneration, left eye, with active choroidal neovascularization: Secondary | ICD-10-CM | POA: Diagnosis not present

## 2018-09-29 ENCOUNTER — Other Ambulatory Visit: Payer: Medicare Other

## 2018-10-19 ENCOUNTER — Other Ambulatory Visit: Payer: Self-pay

## 2018-10-19 ENCOUNTER — Ambulatory Visit (INDEPENDENT_AMBULATORY_CARE_PROVIDER_SITE_OTHER): Payer: Medicare Other

## 2018-10-19 DIAGNOSIS — I4819 Other persistent atrial fibrillation: Secondary | ICD-10-CM

## 2018-10-19 DIAGNOSIS — I350 Nonrheumatic aortic (valve) stenosis: Secondary | ICD-10-CM

## 2018-10-20 ENCOUNTER — Telehealth: Payer: Self-pay | Admitting: *Deleted

## 2018-10-20 DIAGNOSIS — I5022 Chronic systolic (congestive) heart failure: Secondary | ICD-10-CM

## 2018-10-20 NOTE — Telephone Encounter (Signed)
Call placed to the patient. The patient's son answered the phone and was given the results, per dpr.   His son requested that his father be called at [phone]. The phone went directly to voicemail. Message has been left to call back.

## 2018-10-20 NOTE — Telephone Encounter (Signed)
-----   Message from Rise Mu, PA-C sent at 10/20/2018  7:18 AM EST ----- Echo showed a pump function of 20-25%, mitral valve repair was noted with mild to moderately leaky mitral valve. Severely dilated left atrium. Moderately dilated right side of the heart with mildly reduced pump function. Moderately leaky tricuspid valve. The pressure in the vessels that go to the lungs was elevated at 71 mmHg.   Compared to echo done in 03/2018, pump function is slightly reduced, the leaky mitral valve was stable, and his pulmonary artery pressure remains elevated. He is known to have aortic stenosis, though the aortic valve was poorly visualized on the most recent echo. His aortic stenosis is likely underestimated given his weak heart.   He has been maintained on Toprol XL and eplerenone. Not currently on an ACEi/ARB/Entresto. BP has been on the softer side. Please start lisinopril 2.5 mg daily. Schedule bmet 1 week after starting lisinopril.   Please schedule patient to see Dr. Fletcher Anon in 1 month.

## 2018-10-23 MED ORDER — LISINOPRIL 2.5 MG PO TABS: 2.5000 mg | ORAL_TABLET | Freq: Every day | ORAL | 1 refills | Status: DC

## 2018-10-23 NOTE — Telephone Encounter (Signed)
Patient made aware of results and verbalized understanding.  The patient will come in next week for a BMET. Lisinopril has been sent in for him.   He has refused to make a follow up appointment at this time. He will wait until next week.

## 2018-10-28 ENCOUNTER — Other Ambulatory Visit: Payer: Self-pay | Admitting: Family Medicine

## 2018-11-15 ENCOUNTER — Other Ambulatory Visit: Payer: Self-pay | Admitting: Family Medicine

## 2018-11-27 DIAGNOSIS — H353221 Exudative age-related macular degeneration, left eye, with active choroidal neovascularization: Secondary | ICD-10-CM | POA: Diagnosis not present

## 2018-12-01 DIAGNOSIS — Z4420 Encounter for fitting and adjustment of artificial eye, unspecified: Secondary | ICD-10-CM | POA: Diagnosis not present

## 2018-12-06 ENCOUNTER — Other Ambulatory Visit: Payer: Self-pay

## 2018-12-06 ENCOUNTER — Encounter: Payer: Self-pay | Admitting: Emergency Medicine

## 2018-12-06 ENCOUNTER — Inpatient Hospital Stay
Admission: EM | Admit: 2018-12-06 | Discharge: 2018-12-08 | DRG: 378 | Disposition: A | Discharge status: Home / Self Care | Payer: Medicare Other | Attending: Internal Medicine | Admitting: Internal Medicine

## 2018-12-06 DIAGNOSIS — I4819 Other persistent atrial fibrillation: Secondary | ICD-10-CM | POA: Diagnosis present

## 2018-12-06 DIAGNOSIS — Z8711 Personal history of peptic ulcer disease: Secondary | ICD-10-CM

## 2018-12-06 DIAGNOSIS — I428 Other cardiomyopathies: Secondary | ICD-10-CM | POA: Diagnosis not present

## 2018-12-06 DIAGNOSIS — K279 Peptic ulcer, site unspecified, unspecified as acute or chronic, without hemorrhage or perforation: Secondary | ICD-10-CM | POA: Diagnosis not present

## 2018-12-06 DIAGNOSIS — D509 Iron deficiency anemia, unspecified: Secondary | ICD-10-CM | POA: Diagnosis not present

## 2018-12-06 DIAGNOSIS — E7800 Pure hypercholesterolemia, unspecified: Secondary | ICD-10-CM | POA: Diagnosis not present

## 2018-12-06 DIAGNOSIS — E871 Hypo-osmolality and hyponatremia: Secondary | ICD-10-CM | POA: Diagnosis present

## 2018-12-06 DIAGNOSIS — Z903 Acquired absence of stomach [part of]: Secondary | ICD-10-CM | POA: Diagnosis not present

## 2018-12-06 DIAGNOSIS — K31819 Angiodysplasia of stomach and duodenum without bleeding: Secondary | ICD-10-CM | POA: Diagnosis not present

## 2018-12-06 DIAGNOSIS — N138 Other obstructive and reflux uropathy: Secondary | ICD-10-CM | POA: Diagnosis not present

## 2018-12-06 DIAGNOSIS — Z888 Allergy status to other drugs, medicaments and biological substances status: Secondary | ICD-10-CM | POA: Diagnosis not present

## 2018-12-06 DIAGNOSIS — H5461 Unqualified visual loss, right eye, normal vision left eye: Secondary | ICD-10-CM | POA: Diagnosis not present

## 2018-12-06 DIAGNOSIS — G4730 Sleep apnea, unspecified: Secondary | ICD-10-CM | POA: Diagnosis not present

## 2018-12-06 DIAGNOSIS — K5730 Diverticulosis of large intestine without perforation or abscess without bleeding: Secondary | ICD-10-CM | POA: Diagnosis present

## 2018-12-06 DIAGNOSIS — K219 Gastro-esophageal reflux disease without esophagitis: Secondary | ICD-10-CM | POA: Diagnosis present

## 2018-12-06 DIAGNOSIS — I480 Paroxysmal atrial fibrillation: Secondary | ICD-10-CM | POA: Diagnosis present

## 2018-12-06 DIAGNOSIS — K625 Hemorrhage of anus and rectum: Secondary | ICD-10-CM | POA: Diagnosis not present

## 2018-12-06 DIAGNOSIS — Z8719 Personal history of other diseases of the digestive system: Secondary | ICD-10-CM | POA: Diagnosis not present

## 2018-12-06 DIAGNOSIS — D519 Vitamin B12 deficiency anemia, unspecified: Secondary | ICD-10-CM | POA: Diagnosis present

## 2018-12-06 DIAGNOSIS — D62 Acute posthemorrhagic anemia: Secondary | ICD-10-CM | POA: Diagnosis not present

## 2018-12-06 DIAGNOSIS — Z801 Family history of malignant neoplasm of trachea, bronchus and lung: Secondary | ICD-10-CM | POA: Diagnosis not present

## 2018-12-06 DIAGNOSIS — I110 Hypertensive heart disease with heart failure: Secondary | ICD-10-CM | POA: Diagnosis present

## 2018-12-06 DIAGNOSIS — K31811 Angiodysplasia of stomach and duodenum with bleeding: Secondary | ICD-10-CM | POA: Diagnosis not present

## 2018-12-06 DIAGNOSIS — K449 Diaphragmatic hernia without obstruction or gangrene: Secondary | ICD-10-CM | POA: Diagnosis not present

## 2018-12-06 DIAGNOSIS — I10 Essential (primary) hypertension: Secondary | ICD-10-CM | POA: Diagnosis not present

## 2018-12-06 DIAGNOSIS — I5022 Chronic systolic (congestive) heart failure: Secondary | ICD-10-CM | POA: Diagnosis present

## 2018-12-06 DIAGNOSIS — K921 Melena: Secondary | ICD-10-CM | POA: Diagnosis not present

## 2018-12-06 DIAGNOSIS — I4891 Unspecified atrial fibrillation: Secondary | ICD-10-CM | POA: Diagnosis not present

## 2018-12-06 DIAGNOSIS — N401 Enlarged prostate with lower urinary tract symptoms: Secondary | ICD-10-CM | POA: Diagnosis present

## 2018-12-06 DIAGNOSIS — Z980 Intestinal bypass and anastomosis status: Secondary | ICD-10-CM | POA: Diagnosis not present

## 2018-12-06 LAB — COMPREHENSIVE METABOLIC PANEL
ALT: 13 U/L (ref 0–44)
AST: 23 U/L (ref 15–41)
Albumin: 3.7 g/dL (ref 3.5–5.0)
Alkaline Phosphatase: 57 U/L (ref 38–126)
Anion gap: 11 (ref 5–15)
BUN: 12 mg/dL (ref 8–23)
CO2: 22 mmol/L (ref 22–32)
Calcium: 8.3 mg/dL — ABNORMAL LOW (ref 8.9–10.3)
Chloride: 94 mmol/L — ABNORMAL LOW (ref 98–111)
Creatinine, Ser: 0.73 mg/dL (ref 0.61–1.24)
GFR calc Af Amer: >60 mL/min (ref >60)
GFR calc non Af Amer: >60 mL/min (ref >60)
GLUCOSE: 142 mg/dL — AB (ref 70–99)
Potassium: 4.1 mmol/L (ref 3.5–5.1)
Sodium: 127 mmol/L — ABNORMAL LOW (ref 135–145)
Total Bilirubin: 0.9 mg/dL (ref 0.3–1.2)
Total Protein: 6.5 g/dL (ref 6.5–8.1)

## 2018-12-06 LAB — HEMOGLOBIN AND HEMATOCRIT, BLOOD
HCT: 31 % — ABNORMAL LOW (ref 39.0–52.0)
Hemoglobin: 9.6 g/dL — ABNORMAL LOW (ref 13.0–17.0)

## 2018-12-06 LAB — CBC
HCT: 30.8 % — ABNORMAL LOW (ref 39.0–52.0)
Hemoglobin: 9.6 g/dL — ABNORMAL LOW (ref 13.0–17.0)
MCH: 23.8 pg — ABNORMAL LOW (ref 26.0–34.0)
MCHC: 31.2 g/dL (ref 30.0–36.0)
MCV: 76.4 fL — ABNORMAL LOW (ref 80.0–100.0)
Platelets: 205 10*3/uL (ref 150–400)
RBC: 4.03 MIL/uL — ABNORMAL LOW (ref 4.22–5.81)
RDW: 19.3 % — AB (ref 11.5–15.5)
WBC: 3.6 10*3/uL — ABNORMAL LOW (ref 4.0–10.5)
nRBC: 0 % (ref 0.0–0.2)

## 2018-12-06 LAB — TYPE AND SCREEN
ABO/RH(D): O POS
Antibody Screen: NEGATIVE

## 2018-12-06 LAB — APTT: aPTT: 30 seconds (ref 24–36)

## 2018-12-06 LAB — PROTIME-INR
INR: 1 (ref 0.8–1.2)
Prothrombin Time: 13.5 seconds (ref 11.4–15.2)

## 2018-12-06 MED ORDER — SPIRONOLACTONE 25 MG PO TABS
25.0000 mg | ORAL_TABLET | Freq: Every day | ORAL | Status: DC
  Administered 2018-12-07 – 2018-12-08 (×2): 25 mg via ORAL
  Filled 2018-12-06 (×2): qty 1

## 2018-12-06 MED ORDER — ONDANSETRON HCL 4 MG/2ML IJ SOLN
4.0000 mg | Freq: Four times a day (QID) | INTRAMUSCULAR | Status: DC | PRN
  Administered 2018-12-08: 4 mg via INTRAVENOUS
  Filled 2018-12-06: qty 2

## 2018-12-06 MED ORDER — SODIUM CHLORIDE 0.9 % IV SOLN
80.0000 mg | Freq: Once | INTRAVENOUS | Status: AC
  Administered 2018-12-06: 80 mg via INTRAVENOUS
  Filled 2018-12-06: qty 80

## 2018-12-06 MED ORDER — SODIUM CHLORIDE 0.9 % IV SOLN
8.0000 mg/h | INTRAVENOUS | Status: DC
  Administered 2018-12-06: 8 mg/h via INTRAVENOUS
  Filled 2018-12-06 (×2): qty 80

## 2018-12-06 MED ORDER — FERROUS SULFATE 325 (65 FE) MG PO TABS
325.0000 mg | ORAL_TABLET | Freq: Two times a day (BID) | ORAL | Status: DC
  Administered 2018-12-06 – 2018-12-08 (×4): 325 mg via ORAL
  Filled 2018-12-06 (×4): qty 1

## 2018-12-06 MED ORDER — LISINOPRIL 2.5 MG PO TABS
2.5000 mg | ORAL_TABLET | Freq: Every day | ORAL | Status: DC
  Administered 2018-12-07 – 2018-12-08 (×2): 2.5 mg via ORAL
  Filled 2018-12-06 (×2): qty 1

## 2018-12-06 MED ORDER — ACETAMINOPHEN 650 MG RE SUPP: 650.0000 mg | Freq: Four times a day (QID) | RECTAL | Status: DC | PRN

## 2018-12-06 MED ORDER — SODIUM CHLORIDE 0.9 % IV SOLN
INTRAVENOUS | Status: DC
  Administered 2018-12-06 – 2018-12-07 (×3): via INTRAVENOUS

## 2018-12-06 MED ORDER — ACETAMINOPHEN 325 MG PO TABS
650.0000 mg | ORAL_TABLET | Freq: Four times a day (QID) | ORAL | Status: DC | PRN
  Administered 2018-12-07 – 2018-12-08 (×3): 650 mg via ORAL
  Filled 2018-12-06 (×3): qty 2

## 2018-12-06 MED ORDER — ALBUTEROL SULFATE (2.5 MG/3ML) 0.083% IN NEBU: 2.5000 mg | INHALATION_SOLUTION | Freq: Four times a day (QID) | RESPIRATORY_TRACT | Status: DC | PRN

## 2018-12-06 MED ORDER — VITAMIN D 25 MCG (1000 UNIT) PO TABS
1000.0000 [IU] | ORAL_TABLET | Freq: Every day | ORAL | Status: DC
  Administered 2018-12-07 – 2018-12-08 (×2): 1000 [IU] via ORAL
  Filled 2018-12-06 (×2): qty 1

## 2018-12-06 MED ORDER — VITAMIN B-12 100 MCG PO TABS
100.0000 ug | ORAL_TABLET | Freq: Every day | ORAL | Status: DC
  Administered 2018-12-07 – 2018-12-08 (×2): 100 ug via ORAL
  Filled 2018-12-06 (×2): qty 1

## 2018-12-06 MED ORDER — HYDROCODONE-ACETAMINOPHEN 5-325 MG PO TABS: 1.0000 | ORAL_TABLET | ORAL | Status: DC | PRN

## 2018-12-06 MED ORDER — METOPROLOL SUCCINATE ER 25 MG PO TB24
25.0000 mg | ORAL_TABLET | Freq: Every day | ORAL | Status: DC
  Administered 2018-12-07 – 2018-12-08 (×2): 25 mg via ORAL
  Filled 2018-12-06 (×2): qty 1

## 2018-12-06 MED ORDER — POLYVINYL ALCOHOL 1.4 % OP SOLN
1.0000 [drp] | Freq: Every morning | OPHTHALMIC | Status: DC
  Administered 2018-12-07 – 2018-12-08 (×2): 1 [drp] via OPHTHALMIC
  Filled 2018-12-06 (×3): qty 15

## 2018-12-06 MED ORDER — PANTOPRAZOLE SODIUM 40 MG IV SOLR: 40.0000 mg | Freq: Two times a day (BID) | INTRAVENOUS | Status: DC

## 2018-12-06 MED ORDER — ONDANSETRON HCL 4 MG PO TABS: 4.0000 mg | ORAL_TABLET | Freq: Four times a day (QID) | ORAL | Status: DC | PRN

## 2018-12-06 MED ORDER — AMIODARONE HCL 200 MG PO TABS
200.0000 mg | ORAL_TABLET | Freq: Every day | ORAL | Status: DC
  Administered 2018-12-07 – 2018-12-08 (×2): 200 mg via ORAL
  Filled 2018-12-06 (×2): qty 1

## 2018-12-06 MED ORDER — ALBUTEROL SULFATE (2.5 MG/3ML) 0.083% IN NEBU
2.5000 mg | INHALATION_SOLUTION | Freq: Four times a day (QID) | RESPIRATORY_TRACT | Status: DC
  Administered 2018-12-06: 2.5 mg via RESPIRATORY_TRACT
  Filled 2018-12-06: qty 3

## 2018-12-06 MED ORDER — SODIUM CHLORIDE 0.9 % IV SOLN: INTRAVENOUS | Status: DC

## 2018-12-06 MED ORDER — TAMSULOSIN HCL 0.4 MG PO CAPS
0.4000 mg | ORAL_CAPSULE | Freq: Every day | ORAL | Status: DC
  Administered 2018-12-07 – 2018-12-08 (×2): 0.4 mg via ORAL
  Filled 2018-12-06 (×2): qty 1

## 2018-12-06 NOTE — Progress Notes (Signed)
Per Respiratory Protocol Assessment, patient scored level 1, Frequency changed from Q6 to Q6 PRN. Patient SAT 99% on Room Air, Clear bilateral breath sounds, no pulmonary history, no smoking history, no recent CT/Chest Xray. Patient states he does not take breathing treatments or inhalers at home.

## 2018-12-06 NOTE — ED Provider Notes (Signed)
[facility] Emergency Department Provider Note  ____________________________________________   I have reviewed the triage vital signs and the nursing notes. Where available I have reviewed prior notes and, if possible and indicated, outside hospital notes.    HISTORY  Chief Complaint Rectal Bleeding    HPI [name] is a 85 y.o. male with a history of Upper GI bleeds in the past not on any blood thinners, not on aspirin, has had endoscopy with bleeding ulcer was found as recently as last April, has not had any bleed since then that he knows of.  Denies any abdominal pain.  Has been having melena though for the last week off and on, he had a couple melanotic stools today.  No vomiting no hematemesis.  No bright red blood per rectum.  Feels "maybe a little" week.  Stopped taking his iron pills which did not help.  Is taking Pepto-Bismol occasionally cannot remember the last time he took it no other alleviating or aggravating factors no other associated symptoms no other prior treatment he does have a history of gastric bypass which complicates his history little bit.   Past Medical History:  Diagnosis Date  . Blind right eye   . Diaphragmatic hernia without mention of obstruction or gangrene   . Diverticulosis of colon (without mention of hemorrhage)   . Dizziness and giddiness   . Esophageal reflux   . Gastritis 12/18/08   EGD, 3 clips remaining (Dr. Vira Agar)  . Hypertension   . Iron deficiency anemia    a.  Secondary to recurrent GI bleeding  . Macular degeneration   . Nonischemic cardiomyopathy (HCC)    a.  TTE and 3/15: EF 35 to 40%, mild left ear, mitral valve repair with mild to moderate MR, mild to moderate TR, PASP 48; b.  TTE 3/18: EF 25 to 30%, not technically sufficient to allow for LV diastolic function, mild left ear possibly underestimated secondary to degree of CM, moderate AI calcified mitral annulus, moderate MR mod to sev dilated LA  mod dilated RA moderate TR PASP 71  . Persistent atrial fibrillation    a.  Status post multiple cardioversions; b.  Previously on Coumadin though held secondary to recurrent GI bleed  . Personal history of peptic ulcer disease   . Pure hypercholesterolemia   . Sprain of neck   . Unspecified hemorrhoids without mention of complication   . Unspecified sleep apnea   . Upper GI bleed 7/15-7/20/09   a.  Recurrent GI bleed dating back to at least 2009 with subsequent bleed in 2010, multiple GI bleeds in 01/2018    Patient Active Problem List   Diagnosis Date Noted  . Acute gastric ulcer with hemorrhage   . Stomach irritation   . Hyponatremia 01/07/2018  . Melena   . Blood in stool 01/02/2018  . Rhinitis 11/04/2017  . Bilateral carotid bruits 11/17/2015  . H. pylori infection 10/26/2015  . Persistent atrial fibrillation 04/28/2015  . Medicare annual wellness visit, subsequent 01/31/2015  . Advance care planning 01/31/2015  . Long term current use of anticoagulant therapy 01/30/2015  . Abnormal TSH 02/16/2014  . Chronic systolic heart failure ([place]) 08/16/2013  . Fatigue 09/08/2012  . Cardiomyopathy, dilated, nonischemic ([place] ) 12/19/2011  . Hypertension 01/20/2011  . ROSACEA 11/12/2009  . BPH with obstruction/lower urinary tract symptoms 09/03/2009  . MITRAL VALVE REPLACEMENT, HX OF 06/11/2009  . OTHER AND UNSPECIFIED MITRAL VALVE DISEASES 03/04/2009  . PEPTIC ULCER DISEASE, HX OF 02/27/2009  .  ANEMIA, IRON DEFICIENCY 04/09/2008  . GERD 04/09/2008  . HYPERCHOLESTEROLEMIA 04/08/2008  . LEFT VENTRICULAR FAILURE 04/08/2008  . HEMORRHOIDS 04/08/2008  . HIATAL HERNIA 04/08/2008  . DIVERTICULOSIS, COLON 04/08/2008  . SLEEP APNEA 04/08/2008  . GASTROINTESTINAL HEMORRHAGE, HX OF 04/08/2008    Past Surgical History:  Procedure Laterality Date  . APPENDECTOMY    . CARDIAC CATHETERIZATION  1996   [facility]  . CARDIAC CATHETERIZATION    . CARDIAC CATHETERIZATION    . CARDIOVERSION   12/17/2011   Procedure: CARDIOVERSION;  Surgeon: Hillary Bow, MD;  Location: [place];  Service: Cardiovascular;  Laterality: N/A;  . CATARACT EXTRACTION  10/13/08   left  . CHOLECYSTECTOMY    . ESOPHAGOGASTRODUODENOSCOPY Left 01/17/2018   Procedure: ESOPHAGOGASTRODUODENOSCOPY (EGD);  Surgeon: Virgel Manifold, MD;  [facility] ENDOSCOPY;  Service: Endoscopy;  Laterality: Left;  . ESOPHAGOGASTRODUODENOSCOPY (EGD) WITH PROPOFOL N/A 01/04/2018   Procedure: ESOPHAGOGASTRODUODENOSCOPY (EGD) WITH PROPOFOL;  Surgeon: [name], MD;  Location: [facility] ENDOSCOPY;  Service: Endoscopy;  Laterality: N/A;  . MITRAL VALVE ANNULOPLASTY  1996  . MITRAL VALVE REPAIR  1995  . PARTIAL GASTRECTOMY      Prior to Admission medications   Medication Sig Start Date End Date Taking? Authorizing Provider  ferrous sulfate 325 (65 FE) MG tablet Take 325 mg by mouth 2 (two) times daily.   Yes [provider]  amiodarone (PACERONE) 200 MG tablet TAKE 1 TABLET BY MOUTH ONCE DAILY 11/08/17   Wellington Hampshire, MD  cholecalciferol (VITAMIN D) 1000 UNITS tablet Take 1,000 Units by mouth daily.    [provider]  cyanocobalamin 100 MCG tablet Take 100 mcg by mouth daily.      [provider]  eplerenone (INSPRA) 25 MG tablet Take 25 mg by mouth daily. 03/19/18   [provider]  finasteride (PROSCAR) 5 MG tablet Take 1 tablet (5 mg total) by mouth daily. 11/03/17   Tonia Ghent, MD  fish oil-omega-3 fatty acids 1000 MG capsule Take 1 g by mouth daily.    [provider]  hydroxypropyl methylcellulose (ISOPTO TEARS) 2.5 % ophthalmic solution Place 1 drop into both eyes every morning.    [provider]  lisinopril (PRINIVIL,ZESTRIL) 2.5 MG tablet Take 1 tablet (2.5 mg total) by mouth daily. 10/23/18 01/21/19  Rise Mu, PA-C  metoprolol succinate (TOPROL XL) 25 MG 24 hr tablet Take 1 tablet (25 mg total) by mouth daily. 07/18/18   Wellington Hampshire, MD  Multiple  Vitamin (MULTIVITAMIN) tablet Take 1 tablet by mouth daily.      [provider]  pantoprazole (PROTONIX) 40 MG tablet Take 1 tablet (40 mg total) by mouth daily. 04/05/18 04/05/19  Virgel Manifold, MD  tamsulosin (FLOMAX) 0.4 MG CAPS capsule TAKE 1 CAPSULE BY MOUTH ONCE DAILY 10/30/18   Tonia Ghent, MD  vitamin C (ASCORBIC ACID) 500 MG tablet Take 500 mg by mouth daily.    [provider]    Allergies Atorvastatin and Statins  Family History  Problem Relation Age of Onset  . Cancer Father        lung  . Colon cancer Neg Hx   . Prostate cancer Neg Hx     Social History Social History   Tobacco Use  . Smoking status: Never Smoker  . Smokeless tobacco: Never Used  Substance Use Topics  . Alcohol use: Yes    Comment: socially, beer   . Drug use: No    Review of  Systems Constitutional: No fever/chills Eyes: No visual changes. ENT: No sore throat. No stiff neck no neck pain Cardiovascular: Denies chest pain. Respiratory: Denies shortness of breath. Gastrointestinal:   no vomiting.  No diarrhea.  No constipation. Genitourinary: Negative for dysuria. Musculoskeletal: Negative lower extremity swelling Skin: Negative for rash. Neurological: Negative for severe headaches, focal weakness or numbness.   ____________________________________________   PHYSICAL EXAM:  VITAL SIGNS: ED Triage Vitals  Enc Vitals Group     BP 12/06/18 1145 118/65     Pulse Rate 12/06/18 1145 72     Resp 12/06/18 1145 16     Temp --      Temp Source 12/06/18 1145 Oral     SpO2 12/06/18 1145 100 %     Weight 12/06/18 1142 165 lb (74.8 kg)     Height 12/06/18 1142 5\' 11"  (1.803 m)     Head Circumference --      Peak Flow --      Pain Score 12/06/18 1142 0     Pain Loc --      Pain Edu? --      Excl. in [place]? --     Constitutional: Alert and oriented. Well appearing and in no acute distress. Eyes: Conjunctivae are normal HEENT: No congestion/rhinnorhea. Mucous  membranes are moist.  Oropharynx non-erythematous Neck:   Nontender with no meningismus, no masses, no stridor Cardiovascular: Normal rate, regular rhythm. Grossly normal heart sounds.  Good peripheral circulation. Respiratory: Normal respiratory effort.  No retractions. Lungs CTAB. Abdominal: Soft and nontender. No distention. No guarding no rebound Exam: Guaiac positive black stool Musculoskeletal: No lower extremity tenderness, no upper extremity tenderness. No joint effusions, no DVT signs strong distal pulses no edema Psychiatric: Mood and affect are normal. Speech and behavior are normal.  ____________________________________________   LABS (all labs ordered are listed, but only abnormal results are displayed)  Labs Reviewed  COMPREHENSIVE METABOLIC PANEL - Abnormal; Notable for the following components:      Result Value   Sodium 127 (*)    Chloride 94 (*)    Glucose, Bld 142 (*)    Calcium 8.3 (*)    All other components within normal limits  CBC - Abnormal; Notable for the following components:   WBC 3.6 (*)    RBC 4.03 (*)    Hemoglobin 9.6 (*)    HCT 30.8 (*)    MCV 76.4 (*)    MCH 23.8 (*)    RDW 19.3 (*)    All other components within normal limits  PROTIME-INR  APTT  POC OCCULT BLOOD, ED  TYPE AND SCREEN    Pertinent labs  results that were available during my care of the patient were reviewed by me and considered in my medical decision making (see chart for details). ____________________________________________  EKG  I personally interpreted any EKGs ordered by me or triage  ____________________________________________  RADIOLOGY  Pertinent labs & imaging results that were available during my care of the patient were reviewed by me and considered in my medical decision making (see chart for details). If possible, patient and/or family made aware of any abnormal findings.  No results found. ____________________________________________     PROCEDURES  Procedure(s) performed: None  Procedures  Critical Care performed: None  ____________________________________________   INITIAL IMPRESSION / ASSESSMENT AND PLAN / ED COURSE  Pertinent labs & imaging results that were available during my care of the patient were reviewed by me and considered in my medical decision making (see  chart for details).  With likely upper GI bleed, hemoglobin is reassuring although it has not been checked for 6 months so we do not know exactly what it was before it started at this time.  Vital signs are reassuring.  I talked to Dr. Alice Reichert, of GI, appreciate consult.  Type and screen and coags are pending although he is not on anticoagulation.  Dr. Alice Reichert feels the patient would benefit from admission.  We have talked to Dr. Jerelyn Charles of the admitting team and they will admit.  Appreciate all of the assistance.  No further intervention indicated at this time per Dr. Alice Reichert who was kind enough to see the patient in his room.    ____________________________________________   FINAL CLINICAL IMPRESSION(S) / ED DIAGNOSES  Final diagnoses:  Rectal bleeding      This chart was dictated using voice recognition software.  Despite best efforts to proofread,  errors can occur which can change meaning.      Schuyler Amor, MD 12/06/18 1626

## 2018-12-06 NOTE — H&P (View-Only) (Signed)
GI Inpatient Consult Note  Reason for Consult: Melena   Attending Requesting Consult: Dr. Jerelyn Charles  History of Present Illness: [name] is a 85 y.o. male seen for evaluation of melena at the request of Dr. Burlene Arnt. Pt has a PMH of [place] II s/t peptic ulcer disease 40 years ago. He presents to the ED today for melena that has progressively worsened over the past 3 weeks. He had EGD 01/2018 performed by Dr. 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 Hematochezia persisted and was hospitalized  two weeks later where he had repeat EGD with oozing gastric ulcer with adherent blood clot seen in stomach treated with APC. He had friable mucosa throughout his entire stomach with contact bleeding.   Patient seen and examined today in the ED resting comfortably in stretcher. He endorses melena over the past 3 weeks that has progressively worsened. He stopped his oral iron supplementation but the black, tarry stools persisted. Last BM was last night and was tarry. Hemoglobin in the ED was found to be 9.6. Hemoglobin 6 months ago was 9.3 and 9 months ago was 11.2. He denies any bright red, bloody stools. He denies any abdominal pain, diarrhea, or constipation. He denies any upper GI symptoms of nausea, vomiting, dysphagia, or odynophagia symptoms. No recent medication changes. He reports he has been taking his prescribed PPI. He denies any hematemesis. He does feel a little weaker from baseline. He does endorse taking some Pepto-Bismol occasionally for GI upset but reports it has been several weeks since he has taking it. He takes Aleve occasionally for minor pains, but denies daily use.   Last Colonoscopy: 2-5 years ago in [place]  Last Endoscopy: 01/2018   Past Medical History:  Past Medical History:  Diagnosis Date  . Blind right eye   . Diaphragmatic hernia without mention of obstruction or gangrene   . Diverticulosis of colon (without mention of hemorrhage)   . Dizziness  and giddiness   . Esophageal reflux   . Gastritis 12/18/08   EGD, 3 clips remaining (Dr. Vira Agar)  . Hypertension   . Iron deficiency anemia    a.  Secondary to recurrent GI bleeding  . Macular degeneration   . Nonischemic cardiomyopathy (HCC)    a.  TTE and 3/15: EF 35 to 40%, mild left ear, mitral valve repair with mild to moderate MR, mild to moderate TR, PASP 48; b.  TTE 3/18: EF 25 to 30%, not technically sufficient to allow for LV diastolic function, mild left ear possibly underestimated secondary to degree of CM, moderate AI calcified mitral annulus, moderate MR mod to sev dilated LA mod dilated RA moderate TR PASP 71  . Persistent atrial fibrillation    a.  Status post multiple cardioversions; b.  Previously on Coumadin though held secondary to recurrent GI bleed  . Personal history of peptic ulcer disease   . Pure hypercholesterolemia   . Sprain of neck   . Unspecified hemorrhoids without mention of complication   . Unspecified sleep apnea   . Upper GI bleed 7/15-7/20/09   a.  Recurrent GI bleed dating back to at least 2009 with subsequent bleed in 2010, multiple GI bleeds in 01/2018    Problem List: Patient Active Problem List   Diagnosis Date Noted  . Acute gastric ulcer with hemorrhage   . Stomach irritation   . Hyponatremia 01/07/2018  . Melena   . Blood in stool 01/02/2018  . Rhinitis 11/04/2017  . Bilateral carotid  bruits 11/17/2015  . H. pylori infection 10/26/2015  . Persistent atrial fibrillation 04/28/2015  . Medicare annual wellness visit, subsequent 01/31/2015  . Advance care planning 01/31/2015  . Long term current use of anticoagulant therapy 01/30/2015  . Abnormal TSH 02/16/2014  . Chronic systolic heart failure ([place]) 08/16/2013  . Fatigue 09/08/2012  . Cardiomyopathy, dilated, nonischemic ([place]) 12/19/2011  . Hypertension 01/20/2011  . ROSACEA 11/12/2009  . BPH with obstruction/lower urinary tract symptoms 09/03/2009  . MITRAL VALVE REPLACEMENT, HX OF  06/11/2009  . OTHER AND UNSPECIFIED MITRAL VALVE DISEASES 03/04/2009  . PEPTIC ULCER DISEASE, HX OF 02/27/2009  . ANEMIA, IRON DEFICIENCY 04/09/2008  . GERD 04/09/2008  . HYPERCHOLESTEROLEMIA 04/08/2008  . LEFT VENTRICULAR FAILURE 04/08/2008  . HEMORRHOIDS 04/08/2008  . HIATAL HERNIA 04/08/2008  . DIVERTICULOSIS, COLON 04/08/2008  . SLEEP APNEA 04/08/2008  . GASTROINTESTINAL HEMORRHAGE, HX OF 04/08/2008    Past Surgical History: Past Surgical History:  Procedure Laterality Date  . APPENDECTOMY    . CARDIAC CATHETERIZATION  1996   [facility]  . CARDIAC CATHETERIZATION    . CARDIAC CATHETERIZATION    . CARDIOVERSION  12/17/2011   Procedure: CARDIOVERSION;  Surgeon: Hillary Bow, MD;  Location: [place];  Service: Cardiovascular;  Laterality: N/A;  . CATARACT EXTRACTION  10/13/08   left  . CHOLECYSTECTOMY    . ESOPHAGOGASTRODUODENOSCOPY Left 01/17/2018   Procedure: ESOPHAGOGASTRODUODENOSCOPY (EGD);  Surgeon: Virgel Manifold, MD;  Location: [facility] ENDOSCOPY;  Service: Endoscopy;  Laterality: Left;  . ESOPHAGOGASTRODUODENOSCOPY (EGD) WITH PROPOFOL N/A 01/04/2018   Procedure: ESOPHAGOGASTRODUODENOSCOPY (EGD) WITH PROPOFOL;  Surgeon: [name], MD;  Location: [facility] ENDOSCOPY;  Service: Endoscopy;  Laterality: N/A;  . MITRAL VALVE ANNULOPLASTY  1996  . MITRAL VALVE REPAIR  1995  . PARTIAL GASTRECTOMY      Allergies: Allergies  Allergen Reactions  . Atorvastatin Other (See Comments)    Other reaction(s): Unknown REACTION:muscular soreness  . Statins     Muscle aches on mult statins    Home Medications: (Not in a hospital admission)  Home medication reconciliation was completed with the patient.   Scheduled Inpatient Medications:    Continuous Inpatient Infusions:    PRN Inpatient Medications:    Family History: family history includes Cancer in his father.  The patient's family history is negative for inflammatory bowel disorders, GI malignancy, or solid organ  transplantation.  Social History:   reports that he has never smoked. He has never used smokeless tobacco. He reports current alcohol use. He reports that he does not use drugs. The patient denies ETOH, tobacco, or drug use.   Review of Systems: Constitutional: Weight is stable.  Eyes: No changes in vision. ENT: No oral lesions, sore throat.  GI: see HPI.  Heme/Lymph: No easy bruising.  CV: No chest pain.  GU: No hematuria.  Integumentary: No rashes.  Neuro: No headaches.  Psych: No depression/anxiety.  Endocrine: No heat/cold intolerance.  Allergic/Immunologic: No urticaria.  Resp: No cough, SOB.  Musculoskeletal: No joint swelling.    Physical Examination: BP 118/65 (BP Location: Right Arm)   Pulse 72   Resp 16   Ht 5\' 11"  (1.803 m)   Wt 74.8 kg   SpO2 100%   BMI 23.01 kg/m  Gen: NAD, alert and oriented x 4 HEENT: PEERLA, EOMI, Neck: supple, no JVD or thyromegaly Chest: CTA bilaterally, no wheezes, crackles, or other adventitious sounds CV: RRR, no m/g/c/r Abd: soft, NT, ND, +BS in all four quadrants; no HSM, guarding, ridigity, or rebound tenderness  Ext: no edema, well perfused with 2+ pulses, Skin: no rash or lesions noted Lymph: no LAD  Data: Lab Results  Component Value Date   WBC 3.6 (L) 12/06/2018   HGB 9.6 (L) 12/06/2018   HCT 30.8 (L) 12/06/2018   MCV 76.4 (L) 12/06/2018   PLT 205 12/06/2018   Recent Labs  Lab 12/06/18 1152  HGB 9.6*   Lab Results  Component Value Date   NA 127 (L) 12/06/2018   K 4.1 12/06/2018   CL 94 (L) 12/06/2018   CO2 22 12/06/2018   BUN 12 12/06/2018   CREATININE 0.73 12/06/2018   Lab Results  Component Value Date   ALT 13 12/06/2018   AST 23 12/06/2018   ALKPHOS 57 12/06/2018   BILITOT 0.9 12/06/2018   No results for input(s): APTT, INR, PTT in the last 168 hours. Assessment/Plan: Mr. Harari is a 85 y.o. male   85 y/o Caucasian male with a PMH of esophageal reflux, gastritis, HTN, IDA, nonischemic  cardiomyopathy, atrial fibrillation s/p multiple cardioversions, Hx of PUD admitted for melena  1. Melena 2. Hx of PUD   - Likely upper GI bleed. Differential includes peptic ulcer disease, gastritis, esophagitis, duodenitis, AVMs, or malignancy.  - Recommend luminal evaluation with upper endoscopy. We discussed procedure details and indications. We discussed potential risks and complications, including bleeding, infection, small puncture to internal organs, or anesthesia. He consents to proceed - Currently hemodynamically stable. Continue to monitor H&H. Transfuse for hgb <7.0. - Agree with PPI - Hold all anticoagulation - Plan for EGD tomorrow around lunch time with Dr. Alice Reichert  Thank you for the consult. Please call with questions or concerns.  Geanie Kenning, PA-C [place]

## 2018-12-06 NOTE — ED Triage Notes (Signed)
Pt here with c/o rectal bleeding "filling up the toilet with black looking blood" for 3 days now, states he was here for the same last year, can't remember if he received blood or not, appears pale, was eating [place] food and drinking drink while waiting for triage, NAD.

## 2018-12-06 NOTE — H&P (Signed)
[name] at [place] NAME: [name]    MR#:  [id number]   DATE OF BIRTH:  11/21/1932  DATE OF ADMISSION:  12/06/2018  PRIMARY CARE PHYSICIAN: Tonia Ghent, MD   REQUESTING/REFERRING PHYSICIAN:   CHIEF COMPLAINT:   Chief Complaint  Patient presents with  . Rectal Bleeding    HISTORY OF PRESENT ILLNESS: [name]  is a 85 y.o. male with a known history per below that includes gastritis, peptic ulcer disease, presenting with 3-week period of dark bloody bowel movements, in the emergency room patient was found to have hemoglobin of 9.6, sodium 127, chloride 94, patient evaluated by Dr. Toledo/gastroenterology in the emergency room whom is planning endoscopy on tomorrow for further evaluation, family are at the bedside, patient is now be admitted for acute recurrent melena.  PAST MEDICAL HISTORY:   Past Medical History:  Diagnosis Date  . Blind right eye   . Diaphragmatic hernia without mention of obstruction or gangrene   . Diverticulosis of colon (without mention of hemorrhage)   . Dizziness and giddiness   . Esophageal reflux   . Gastritis 12/18/08   EGD, 3 clips remaining (Dr. Vira Agar)  . Hypertension   . Iron deficiency anemia    a.  Secondary to recurrent GI bleeding  . Macular degeneration   . Nonischemic cardiomyopathy (HCC)    a.  TTE and 3/15: EF 35 to 40%, mild left ear, mitral valve repair with mild to moderate MR, mild to moderate TR, PASP 48; b.  TTE 3/18: EF 25 to 30%, not technically sufficient to allow for LV diastolic function, mild left ear possibly underestimated secondary to degree of CM, moderate AI calcified mitral annulus, moderate MR mod to sev dilated LA mod dilated RA moderate TR PASP 71  . Persistent atrial fibrillation    a.  Status post multiple cardioversions; b.  Previously on Coumadin though held secondary to recurrent GI bleed  . Personal history of peptic ulcer disease   . Pure  hypercholesterolemia   . Sprain of neck   . Unspecified hemorrhoids without mention of complication   . Unspecified sleep apnea   . Upper GI bleed 7/15-7/20/09   a.  Recurrent GI bleed dating back to at least 2009 with subsequent bleed in 2010, multiple GI bleeds in 01/2018    PAST SURGICAL HISTORY:  Past Surgical History:  Procedure Laterality Date  . APPENDECTOMY    . CARDIAC CATHETERIZATION  1996   [facility]  . CARDIAC CATHETERIZATION    . CARDIAC CATHETERIZATION    . CARDIOVERSION  12/17/2011   Procedure: CARDIOVERSION;  Surgeon: Hillary Bow, MD;  Location: [facility];  Service: Cardiovascular;  Laterality: N/A;  . CATARACT EXTRACTION  10/13/08   left  . CHOLECYSTECTOMY    . ESOPHAGOGASTRODUODENOSCOPY Left 01/17/2018   Procedure: ESOPHAGOGASTRODUODENOSCOPY (EGD);  Surgeon: Virgel Manifold, MD;  Location: [facility] ENDOSCOPY;  Service: Endoscopy;  Laterality: Left;  . ESOPHAGOGASTRODUODENOSCOPY (EGD) WITH PROPOFOL N/A 01/04/2018   Procedure: ESOPHAGOGASTRODUODENOSCOPY (EGD) WITH PROPOFOL;  Surgeon: [name], MD;  Location: [facility] ENDOSCOPY;  Service: Endoscopy;  Laterality: N/A;  . MITRAL VALVE ANNULOPLASTY  1996  . MITRAL VALVE REPAIR  1995  . PARTIAL GASTRECTOMY      SOCIAL HISTORY:  Social History   Tobacco Use  . Smoking status: Never Smoker  . Smokeless tobacco: Never Used  Substance Use Topics  . Alcohol use: Yes    Comment: socially, beer  FAMILY HISTORY:  Family History  Problem Relation Age of Onset  . Cancer Father        lung  . Colon cancer Neg Hx   . Prostate cancer Neg Hx     DRUG ALLERGIES:  Allergies  Allergen Reactions  . Atorvastatin Other (See Comments)    Other reaction(s): Unknown REACTION:muscular soreness  . Statins     Muscle aches on mult statins    REVIEW OF SYSTEMS:   CONSTITUTIONAL: No fever, fatigue or weakness.  EYES: No blurred or double vision.  EARS, NOSE, AND THROAT: No tinnitus or ear pain.  RESPIRATORY: No cough,  shortness of breath, wheezing or hemoptysis.  CARDIOVASCULAR: No chest pain, orthopnea, edema.  GASTROINTESTINAL: No nausea, vomiting, diarrhea or abdominal pain. + Bloody/dark bleeding per rectum GENITOURINARY: No dysuria, hematuria.  ENDOCRINE: No polyuria, nocturia,  HEMATOLOGY: No anemia, easy bruising or bleeding SKIN: No rash or lesion. MUSCULOSKELETAL: No joint pain or arthritis.   NEUROLOGIC: No tingling, numbness, weakness.  PSYCHIATRY: No anxiety or depression.   MEDICATIONS AT HOME:  Prior to Admission medications   Medication Sig Start Date End Date Taking? Authorizing Provider  amiodarone (PACERONE) 200 MG tablet TAKE 1 TABLET BY MOUTH ONCE DAILY 11/08/17  Yes Wellington Hampshire, MD  cholecalciferol (VITAMIN D) 1000 UNITS tablet Take 1,000 Units by mouth daily.   Yes [provider]  cyanocobalamin 100 MCG tablet Take 100 mcg by mouth daily.     Yes [provider]  eplerenone (INSPRA) 25 MG tablet Take 25 mg by mouth daily. 03/19/18  Yes [provider]  ferrous sulfate 325 (65 FE) MG tablet Take 325 mg by mouth 2 (two) times daily.   Yes [provider]  lisinopril (PRINIVIL,ZESTRIL) 2.5 MG tablet Take 1 tablet (2.5 mg total) by mouth daily. 10/23/18 01/21/19 Yes Dunn, Areta Haber, PA-C  metoprolol succinate (TOPROL XL) 25 MG 24 hr tablet Take 1 tablet (25 mg total) by mouth daily. 07/18/18  Yes Wellington Hampshire, MD  tamsulosin (FLOMAX) 0.4 MG CAPS capsule TAKE 1 CAPSULE BY MOUTH ONCE DAILY 10/30/18  Yes Tonia Ghent, MD  hydroxypropyl methylcellulose (ISOPTO TEARS) 2.5 % ophthalmic solution Place 1 drop into both eyes every morning.    [provider]      PHYSICAL EXAMINATION:   VITAL SIGNS: Blood pressure 118/65, pulse 72, resp. rate 16, height 5\' 11"  (1.803 m), weight 74.8 kg, SpO2 100 %.  GENERAL:  85 y.o.-year-old patient lying in the bed with no acute distress.  Obese, nontoxic-appearing EYES: Pupils equal, round, reactive to  light and accommodation. No scleral icterus. Extraocular muscles intact.  HEENT: Head atraumatic, normocephalic. Oropharynx and nasopharynx clear.  NECK:  Supple, no jugular venous distention. No thyroid enlargement, no tenderness.  LUNGS: Normal breath sounds bilaterally, no wheezing, rales,rhonchi or crepitation. No use of accessory muscles of respiration.  CARDIOVASCULAR: S1, S2 normal. No murmurs, rubs, or gallops.  ABDOMEN: Soft, nontender, nondistended. Bowel sounds present. No organomegaly or mass.  EXTREMITIES: No pedal edema, cyanosis, or clubbing.  NEUROLOGIC: Cranial nerves II through XII are intact. Muscle strength 5/5 in all extremities. Sensation intact. Gait not checked.  PSYCHIATRIC: The patient is alert and oriented x 3.  SKIN: No obvious rash, lesion, or ulcer.   LABORATORY PANEL:   CBC Recent Labs  Lab 12/06/18 1152  WBC 3.6*  HGB 9.6*  HCT 30.8*  PLT 205  MCV 76.4*  MCH 23.8*  MCHC 31.2  RDW 19.3*   ------------------------------------------------------------------------------------------------------------------  Chemistries  Recent Labs  Lab 12/06/18 1152  NA 127*  K 4.1  CL 94*  CO2 22  GLUCOSE 142*  BUN 12  CREATININE 0.73  CALCIUM 8.3*  AST 23  ALT 13  ALKPHOS 57  BILITOT 0.9   ------------------------------------------------------------------------------------------------------------------ estimated creatinine clearance is 74 mL/min (by C-G formula based on SCr of 0.73 mg/dL). ------------------------------------------------------------------------------------------------------------------ No results for input(s): TSH, T4TOTAL, T3FREE, THYROIDAB in the last 72 hours.  Invalid input(s): FREET3   Coagulation profile Recent Labs  Lab 12/06/18 1618  INR 1.0   ------------------------------------------------------------------------------------------------------------------- No results for input(s): DDIMER in the last 72  hours. -------------------------------------------------------------------------------------------------------------------  Cardiac Enzymes No results for input(s): CKMB, TROPONINI, MYOGLOBIN in the last 168 hours.  Invalid input(s): CK ------------------------------------------------------------------------------------------------------------------ Invalid input(s): POCBNP  ---------------------------------------------------------------------------------------------------------------  Urinalysis    Component Value Date/Time   COLORURINE YELLOW (A) 01/08/2018 2015   APPEARANCEUR CLEAR (A) 01/08/2018 2015   LABSPEC 1.016 01/08/2018 2015   PHURINE 5.0 01/08/2018 2015   GLUCOSEU NEGATIVE 01/08/2018 2015   HGBUR NEGATIVE 01/08/2018 2015   [place] NEGATIVE 01/08/2018 2015   KETONESUR NEGATIVE 01/08/2018 2015   PROTEINUR 30 (A) 01/08/2018 2015   NITRITE NEGATIVE 01/08/2018 2015   LEUKOCYTESUR NEGATIVE 01/08/2018 2015     RADIOLOGY: No results found.  EKG: Orders placed or performed in visit on 05/30/18  . EKG 12-Lead    IMPRESSION AND PLAN: *Acute recurrent melena Noted history of gastritis, bleeding peptic ulcer disease Admit to regular nursing for bed, gastroenterology/Dr. [place] for endoscopy on tomorrow, Protonix drip, H&H every 6 hours, CBC daily, type and screen, transfuse as needed, avoid antiplatelet/anticoagulants/NSAID medications  *History of paroxysmal A. Fib Stable Not on oral anticoagulation due to history of GI bleeding Continue amiodarone, Toprol-XL  *Chronic BPH Continue Flomax  *Chronic iron and B12 deficiency anemia Exacerbated by acute recurrent melena Continue iron and B12 supplementation All other plans as stated above  *Chronic hypertension Stable Continue lisinopril, Toprol-XL  *Acute hyponatremia/hypochloremia IV fluids for rehydration, BMP in the morning   All the records are reviewed and case discussed with ED  provider. Management plans discussed with the patient, family and they are in agreement.  CODE STATUS:full Code Status History    Date Active Date Inactive Code Status Order ID Comments User Context   01/16/2018 1400 01/19/2018 1712 Full Code [id number]  Hillary Bow, MD ED   01/07/2018 1811 01/10/2018 1845 Full Code [id number]  Fritzi Mandes, MD Inpatient   01/02/2018 1732 01/05/2018 1515 Full Code [id number]  Vaughan Basta, MD ED    Advance Directive Documentation     Most Recent Value  Type of Advance Directive  Healthcare Power of [place], Living will  Pre-existing out of facility DNR order (yellow form or pink MOST form)  -  "MOST" Form in Place?  -       TOTAL TIME TAKING CARE OF THIS PATIENT: 45 minutes.    Avel Peace Stan Cantave M.D on 12/06/2018   Between 7am to 6pm - Pager - [phone]  After 6pm go to [email or web address] - password [email or web address] [facility]  Office  [phone]  CC: Primary care physician; Tonia Ghent, MD   Note: This dictation was prepared with Dragon dictation along with smaller phrase technology. Any transcriptional errors that result from this process are unintentional.

## 2018-12-06 NOTE — Consult Note (Signed)
GI Inpatient Consult Note  Reason for Consult: Melena   Attending Requesting Consult: Dr. Jerelyn Charles  History of Present Illness: [name] is a 85 y.o. male seen for evaluation of melena at the request of Dr. Burlene Arnt. Pt has a PMH of [place] II s/t peptic ulcer disease 40 years ago. He presents to the ED today for melena that has progressively worsened over the past 3 weeks. He had EGD 01/2018 performed by Dr. 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 Hematochezia persisted and was hospitalized  two weeks later where he had repeat EGD with oozing gastric ulcer with adherent blood clot seen in stomach treated with APC. He had friable mucosa throughout his entire stomach with contact bleeding.   Patient seen and examined today in the ED resting comfortably in stretcher. He endorses melena over the past 3 weeks that has progressively worsened. He stopped his oral iron supplementation but the black, tarry stools persisted. Last BM was last night and was tarry. Hemoglobin in the ED was found to be 9.6. Hemoglobin 6 months ago was 9.3 and 9 months ago was 11.2. He denies any bright red, bloody stools. He denies any abdominal pain, diarrhea, or constipation. He denies any upper GI symptoms of nausea, vomiting, dysphagia, or odynophagia symptoms. No recent medication changes. He reports he has been taking his prescribed PPI. He denies any hematemesis. He does feel a little weaker from baseline. He does endorse taking some Pepto-Bismol occasionally for GI upset but reports it has been several weeks since he has taking it. He takes Aleve occasionally for minor pains, but denies daily use.   Last Colonoscopy: 2-5 years ago in [place]  Last Endoscopy: 01/2018   Past Medical History:  Past Medical History:  Diagnosis Date  . Blind right eye   . Diaphragmatic hernia without mention of obstruction or gangrene   . Diverticulosis of colon (without mention of hemorrhage)   . Dizziness  and giddiness   . Esophageal reflux   . Gastritis 12/18/08   EGD, 3 clips remaining (Dr. Vira Agar)  . Hypertension   . Iron deficiency anemia    a.  Secondary to recurrent GI bleeding  . Macular degeneration   . Nonischemic cardiomyopathy (HCC)    a.  TTE and 3/15: EF 35 to 40%, mild left ear, mitral valve repair with mild to moderate MR, mild to moderate TR, PASP 48; b.  TTE 3/18: EF 25 to 30%, not technically sufficient to allow for LV diastolic function, mild left ear possibly underestimated secondary to degree of CM, moderate AI calcified mitral annulus, moderate MR mod to sev dilated LA mod dilated RA moderate TR PASP 71  . Persistent atrial fibrillation    a.  Status post multiple cardioversions; b.  Previously on Coumadin though held secondary to recurrent GI bleed  . Personal history of peptic ulcer disease   . Pure hypercholesterolemia   . Sprain of neck   . Unspecified hemorrhoids without mention of complication   . Unspecified sleep apnea   . Upper GI bleed 7/15-7/20/09   a.  Recurrent GI bleed dating back to at least 2009 with subsequent bleed in 2010, multiple GI bleeds in 01/2018    Problem List: Patient Active Problem List   Diagnosis Date Noted  . Acute gastric ulcer with hemorrhage   . Stomach irritation   . Hyponatremia 01/07/2018  . Melena   . Blood in stool 01/02/2018  . Rhinitis 11/04/2017  . Bilateral carotid  bruits 11/17/2015  . H. pylori infection 10/26/2015  . Persistent atrial fibrillation 04/28/2015  . Medicare annual wellness visit, subsequent 01/31/2015  . Advance care planning 01/31/2015  . Long term current use of anticoagulant therapy 01/30/2015  . Abnormal TSH 02/16/2014  . Chronic systolic heart failure ([place]) 08/16/2013  . Fatigue 09/08/2012  . Cardiomyopathy, dilated, nonischemic ([place]) 12/19/2011  . Hypertension 01/20/2011  . ROSACEA 11/12/2009  . BPH with obstruction/lower urinary tract symptoms 09/03/2009  . MITRAL VALVE REPLACEMENT, HX OF  06/11/2009  . OTHER AND UNSPECIFIED MITRAL VALVE DISEASES 03/04/2009  . PEPTIC ULCER DISEASE, HX OF 02/27/2009  . ANEMIA, IRON DEFICIENCY 04/09/2008  . GERD 04/09/2008  . HYPERCHOLESTEROLEMIA 04/08/2008  . LEFT VENTRICULAR FAILURE 04/08/2008  . HEMORRHOIDS 04/08/2008  . HIATAL HERNIA 04/08/2008  . DIVERTICULOSIS, COLON 04/08/2008  . SLEEP APNEA 04/08/2008  . GASTROINTESTINAL HEMORRHAGE, HX OF 04/08/2008    Past Surgical History: Past Surgical History:  Procedure Laterality Date  . APPENDECTOMY    . CARDIAC CATHETERIZATION  1996   [facility]  . CARDIAC CATHETERIZATION    . CARDIAC CATHETERIZATION    . CARDIOVERSION  12/17/2011   Procedure: CARDIOVERSION;  Surgeon: Hillary Bow, MD;  Location: [facility];  Service: Cardiovascular;  Laterality: N/A;  . CATARACT EXTRACTION  10/13/08   left  . CHOLECYSTECTOMY    . ESOPHAGOGASTRODUODENOSCOPY Left 01/17/2018   Procedure: ESOPHAGOGASTRODUODENOSCOPY (EGD);  Surgeon: Virgel Manifold, MD;  Location: [facility] ENDOSCOPY;  Service: Endoscopy;  Laterality: Left;  . ESOPHAGOGASTRODUODENOSCOPY (EGD) WITH PROPOFOL N/A 01/04/2018   Procedure: ESOPHAGOGASTRODUODENOSCOPY (EGD) WITH PROPOFOL;  Surgeon: [name], MD;  Location: [facility] ENDOSCOPY;  Service: Endoscopy;  Laterality: N/A;  . MITRAL VALVE ANNULOPLASTY  1996  . MITRAL VALVE REPAIR  1995  . PARTIAL GASTRECTOMY      Allergies: Allergies  Allergen Reactions  . Atorvastatin Other (See Comments)    Other reaction(s): Unknown REACTION:muscular soreness  . Statins     Muscle aches on mult statins    Home Medications: (Not in a hospital admission)  Home medication reconciliation was completed with the patient.   Scheduled Inpatient Medications:    Continuous Inpatient Infusions:    PRN Inpatient Medications:    Family History: family history includes Cancer in his father.  The patient's family history is negative for inflammatory bowel disorders, GI malignancy, or solid organ  transplantation.  Social History:   reports that he has never smoked. He has never used smokeless tobacco. He reports current alcohol use. He reports that he does not use drugs. The patient denies ETOH, tobacco, or drug use.   Review of Systems: Constitutional: Weight is stable.  Eyes: No changes in vision. ENT: No oral lesions, sore throat.  GI: see HPI.  Heme/Lymph: No easy bruising.  CV: No chest pain.  GU: No hematuria.  Integumentary: No rashes.  Neuro: No headaches.  Psych: No depression/anxiety.  Endocrine: No heat/cold intolerance.  Allergic/Immunologic: No urticaria.  Resp: No cough, SOB.  Musculoskeletal: No joint swelling.    Physical Examination: BP 118/65 (BP Location: Right Arm)   Pulse 72   Resp 16   Ht 5\' 11"  (1.803 m)   Wt 74.8 kg   SpO2 100%   BMI 23.01 kg/m  Gen: NAD, alert and oriented x 4 HEENT: PEERLA, EOMI, Neck: supple, no JVD or thyromegaly Chest: CTA bilaterally, no wheezes, crackles, or other adventitious sounds CV: RRR, no m/g/c/r Abd: soft, NT, ND, +BS in all four quadrants; no HSM, guarding, ridigity, or rebound tenderness  Ext: no edema, well perfused with 2+ pulses, Skin: no rash or lesions noted Lymph: no LAD  Data: Lab Results  Component Value Date   WBC 3.6 (L) 12/06/2018   HGB 9.6 (L) 12/06/2018   HCT 30.8 (L) 12/06/2018   MCV 76.4 (L) 12/06/2018   PLT 205 12/06/2018   Recent Labs  Lab 12/06/18 1152  HGB 9.6*   Lab Results  Component Value Date   NA 127 (L) 12/06/2018   K 4.1 12/06/2018   CL 94 (L) 12/06/2018   CO2 22 12/06/2018   BUN 12 12/06/2018   CREATININE 0.73 12/06/2018   Lab Results  Component Value Date   ALT 13 12/06/2018   AST 23 12/06/2018   ALKPHOS 57 12/06/2018   BILITOT 0.9 12/06/2018   No results for input(s): APTT, INR, PTT in the last 168 hours. Assessment/Plan: Mr. Barren is a 85 y.o. male   85 y/o Caucasian male with a PMH of esophageal reflux, gastritis, HTN, IDA, nonischemic  cardiomyopathy, atrial fibrillation s/p multiple cardioversions, Hx of PUD admitted for melena  1. Melena 2. Hx of PUD   - Likely upper GI bleed. Differential includes peptic ulcer disease, gastritis, esophagitis, duodenitis, AVMs, or malignancy.  - Recommend luminal evaluation with upper endoscopy. We discussed procedure details and indications. We discussed potential risks and complications, including bleeding, infection, small puncture to internal organs, or anesthesia. He consents to proceed - Currently hemodynamically stable. Continue to monitor H&H. Transfuse for hgb <7.0. - Agree with PPI - Hold all anticoagulation - Plan for EGD tomorrow around lunch time with Dr. Alice Reichert  Thank you for the consult. Please call with questions or concerns.  Geanie Kenning, PA-C [place]

## 2018-12-06 NOTE — Progress Notes (Signed)
Family Meeting Note  Advance Directive:yes  Today a meeting took place with the Patient.  Patient is able to participate   The following clinical team members were present during this meeting:MD  The following were discussed:Patient's diagnosis: Acute recurrent melena, Patient's progosis: Unable to determine and Goals for treatment: Full Code  Additional follow-up to be provided: prn  Time spent during discussion:20 minutes  Gorden Harms, MD

## 2018-12-06 NOTE — ED Notes (Signed)
Report given to Sandra RN

## 2018-12-06 NOTE — ED Notes (Signed)
Pt reports that he has rectal bleeding (black tarry stools) x3 weeks that has progressively gotten worse - pt thought is was from his Fe pills so he stopped them but the dark stools continued - had same 1 year ago and had "welding surgery where a hole had come in it" Pt denies weakness or shortness of breath

## 2018-12-06 NOTE — ED Notes (Signed)
ED TO INPATIENT HANDOFF REPORT  ED Nurse Name and Phone #: Helene Kelp [phone]  Z Name/Age/Gender [name] 85 y.o. male Room/Bed: ED15A/ED15A  Code Status   Code Status: Prior  Home/SNF/Other Home A&Ox4 Is this baseline? Yes   Triage Complete: Triage complete  Chief Complaint rectal bleeding  Triage Note Pt here with c/o rectal bleeding "filling up the toilet with black looking blood" for 3 days now, states he was here for the same last year, can't remember if he received blood or not, appears pale, was eating [place] food and drinking drink while waiting for triage, NAD.    Allergies Allergies  Allergen Reactions  . Atorvastatin Other (See Comments)    Other reaction(s): Unknown REACTION:muscular soreness  . Statins     Muscle aches on mult statins    Level of Care/Admitting Diagnosis ED Disposition    ED Disposition Condition [place]: [place] [[id number]]  Level of Care: Med-Surg [16]  Diagnosis: Melena [[id number]]  Admitting Physician: Gorden Harms [[id number]]  Attending Physician: Gorden Harms [[id number]]  Estimated length of stay: past midnight tomorrow  Certification:: I certify this patient will need inpatient services for at least 2 midnights  PT Class (Do Not Modify): Inpatient [101]  PT Acc Code (Do Not Modify): Private [1]       B Medical/Surgery History Past Medical History:  Diagnosis Date  . Blind right eye   . Diaphragmatic hernia without mention of obstruction or gangrene   . Diverticulosis of colon (without mention of hemorrhage)   . Dizziness and giddiness   . Esophageal reflux   . Gastritis 12/18/08   EGD, 3 clips remaining (Dr. Vira Agar)  . Hypertension   . Iron deficiency anemia    a.  Secondary to recurrent GI bleeding  . Macular degeneration   . Nonischemic cardiomyopathy (HCC)    a.  TTE and 3/15: EF 35 to 40%, mild left ear, mitral valve repair with mild to moderate MR,  mild to moderate TR, PASP 48; b.  TTE 3/18: EF 25 to 30%, not technically sufficient to allow for LV diastolic function, mild left ear possibly underestimated secondary to degree of CM, moderate AI calcified mitral annulus, moderate MR mod to sev dilated LA mod dilated RA moderate TR PASP 71  . Persistent atrial fibrillation    a.  Status post multiple cardioversions; b.  Previously on Coumadin though held secondary to recurrent GI bleed  . Personal history of peptic ulcer disease   . Pure hypercholesterolemia   . Sprain of neck   . Unspecified hemorrhoids without mention of complication   . Unspecified sleep apnea   . Upper GI bleed 7/15-7/20/09   a.  Recurrent GI bleed dating back to at least 2009 with subsequent bleed in 2010, multiple GI bleeds in 01/2018   Past Surgical History:  Procedure Laterality Date  . APPENDECTOMY    . CARDIAC CATHETERIZATION  1996   [facility]  . CARDIAC CATHETERIZATION    . CARDIAC CATHETERIZATION    . CARDIOVERSION  12/17/2011   Procedure: CARDIOVERSION;  Surgeon: Hillary Bow, MD;  Location: [place] [facility];  Service: Cardiovascular;  Laterality: N/A;  . CATARACT EXTRACTION  10/13/08   left  . CHOLECYSTECTOMY    . ESOPHAGOGASTRODUODENOSCOPY Left 01/17/2018   Procedure: ESOPHAGOGASTRODUODENOSCOPY (EGD);  Surgeon: Virgel Manifold, MD;  Location: [facility] ENDOSCOPY;  Service: Endoscopy;  Laterality: Left;  . ESOPHAGOGASTRODUODENOSCOPY (EGD) WITH PROPOFOL N/A 01/04/2018   Procedure:  ESOPHAGOGASTRODUODENOSCOPY (EGD) WITH PROPOFOL;  Surgeon: [name], MD;  Location: [facility] ENDOSCOPY;  Service: Endoscopy;  Laterality: N/A;  . MITRAL VALVE ANNULOPLASTY  1996  . MITRAL VALVE REPAIR  1995  . PARTIAL GASTRECTOMY       A IV Location/Drains/Wounds Patient Lines/Drains/Airways Status   Active Line/Drains/Airways    Name:   Placement date:   Placement time:   Site:   Days:   Peripheral IV 12/06/18 Right Forearm   12/06/18    1155    Forearm   less than 1           Intake/Output Last 24 hours No intake or output data in the 24 hours ending 12/06/18 1801  Labs/Imaging Results for orders placed or performed during the hospital encounter of 12/06/18 (from the past 48 hour(s))  Comprehensive metabolic panel     Status: Abnormal   Collection Time: 12/06/18 11:52 AM  Result Value Ref Range   Sodium 127 (L) 135 - 145 mmol/L   Potassium 4.1 3.5 - 5.1 mmol/L   Chloride 94 (L) 98 - 111 mmol/L   CO2 22 22 - 32 mmol/L   Glucose, Bld 142 (H) 70 - 99 mg/dL   BUN 12 8 - 23 mg/dL   Creatinine, Ser 0.73 0.61 - 1.24 mg/dL   Calcium 8.3 (L) 8.9 - 10.3 mg/dL   Total Protein 6.5 6.5 - 8.1 g/dL   Albumin 3.7 3.5 - 5.0 g/dL   AST 23 15 - 41 U/L   ALT 13 0 - 44 U/L   Alkaline Phosphatase 57 38 - 126 U/L   Total Bilirubin 0.9 0.3 - 1.2 mg/dL   GFR calc non Af Amer >60 >60 mL/min   GFR calc Af Amer >60 >60 mL/min   Anion gap 11 5 - 15    Comment: Performed at [facility], [address] [place], [place] [address]  CBC     Status: Abnormal   Collection Time: 12/06/18 11:52 AM  Result Value Ref Range   WBC 3.6 (L) 4.0 - 10.5 K/uL   RBC 4.03 (L) 4.22 - 5.81 MIL/uL   Hemoglobin 9.6 (L) 13.0 - 17.0 g/dL   HCT 30.8 (L) 39.0 - 52.0 %   MCV 76.4 (L) 80.0 - 100.0 fL   MCH 23.8 (L) 26.0 - 34.0 pg   MCHC 31.2 30.0 - 36.0 g/dL   RDW 19.3 (H) 11.5 - 15.5 %   Platelets 205 150 - 400 K/uL   nRBC 0.0 0.0 - 0.2 %    Comment: Performed at [facility], [address] [place], [place] [address]  Type and screen [place]     Status: None   Collection Time: 12/06/18 11:52 AM  Result Value Ref Range   ABO/RH(D) O POS    Antibody Screen NEG    Sample Expiration      12/09/2018 Performed at [facility], [address] [place], [place] [address]   Protime-INR     Status: None   Collection Time: 12/06/18  4:18 PM  Result Value Ref Range   Prothrombin Time 13.5 11.4 - 15.2 seconds   INR 1.0 0.8 - 1.2    Comment:  (NOTE) INR goal varies based on device and disease states. Performed at [facility], [address] [place], [place] [address]   APTT     Status: None   Collection Time: 12/06/18  4:18 PM  Result Value Ref Range   aPTT 30 24 -  36 seconds    Comment: Performed at [facility], [address] [place], [place] [address]   No results found.  Pending Labs [employer] (From admission, onward)    Start     Ordered   Signed and Occupational hygienist morning,   R     Signed and Held   Signed and Held  CBC  Tomorrow morning,   R     Signed and Held   Signed and Held  Hemoglobin and hematocrit, blood  Now then every 8 hours,   R     Signed and Held   Signed and Held  Type and screen [place]  Once,   R    Comments:  [place]    Signed and Held          Vitals/Pain Today's Vitals   12/06/18 1142 12/06/18 1145  BP:  118/65  Pulse:  72  Resp:  16  TempSrc:  Oral  SpO2:  100%  Weight: 74.8 kg   Height: 5\' 11"  (1.803 m)   PainSc: 0-No pain     Isolation Precautions No active isolations  Medications Medications - No data to display  Mobility walks Low fall risk   Focused Assessments Cardiac Assessment Handoff:    Lab Results  Component Value Date   TROPONINI <0.03 03/06/2018   Lab Results  Component Value Date   DDIMER (H) 03/15/2007    1.28        AT THE INHOUSE ESTABLISHED CUTOFF VALUE OF 0.48 ug/mL FEU, THIS ASSAY HAS BEEN DOCUMENTED IN THE LITERATURE TO HAVE   Does the Patient currently have chest pain? No     R Recommendations: See Admitting Provider Note  Report given to:   Additional Notes: hx of GI bleed \

## 2018-12-07 ENCOUNTER — Encounter: Payer: Self-pay | Admitting: Anesthesiology

## 2018-12-07 ENCOUNTER — Encounter
Admission: EM | Disposition: A | Discharge status: Home / Self Care | Payer: Self-pay | Source: Home / Self Care | Attending: Internal Medicine

## 2018-12-07 ENCOUNTER — Inpatient Hospital Stay: Payer: Medicare Other | Admitting: Anesthesiology

## 2018-12-07 ENCOUNTER — Other Ambulatory Visit: Payer: Self-pay

## 2018-12-07 HISTORY — PX: ESOPHAGOGASTRODUODENOSCOPY: SHX5428

## 2018-12-07 LAB — BASIC METABOLIC PANEL
Anion gap: 9 (ref 5–15)
BUN: 10 mg/dL (ref 8–23)
CHLORIDE: 98 mmol/L (ref 98–111)
CO2: 21 mmol/L — ABNORMAL LOW (ref 22–32)
Calcium: 8 mg/dL — ABNORMAL LOW (ref 8.9–10.3)
Creatinine, Ser: 0.7 mg/dL (ref 0.61–1.24)
GFR calc Af Amer: >60 mL/min (ref >60)
GFR calc non Af Amer: >60 mL/min (ref >60)
Glucose, Bld: 101 mg/dL — ABNORMAL HIGH (ref 70–99)
Potassium: 4.3 mmol/L (ref 3.5–5.1)
SODIUM: 128 mmol/L — AB (ref 135–145)

## 2018-12-07 LAB — CBC
HCT: 27.7 % — ABNORMAL LOW (ref 39.0–52.0)
Hemoglobin: 8.7 g/dL — ABNORMAL LOW (ref 13.0–17.0)
MCH: 23.8 pg — ABNORMAL LOW (ref 26.0–34.0)
MCHC: 31.4 g/dL (ref 30.0–36.0)
MCV: 75.7 fL — ABNORMAL LOW (ref 80.0–100.0)
Platelets: 165 10*3/uL (ref 150–400)
RBC: 3.66 MIL/uL — ABNORMAL LOW (ref 4.22–5.81)
RDW: 18.8 % — ABNORMAL HIGH (ref 11.5–15.5)
WBC: 3.2 10*3/uL — ABNORMAL LOW (ref 4.0–10.5)
nRBC: 0 % (ref 0.0–0.2)

## 2018-12-07 LAB — HEMOGLOBIN AND HEMATOCRIT, BLOOD
HCT: 29.7 % — ABNORMAL LOW (ref 39.0–52.0)
Hemoglobin: 9.3 g/dL — ABNORMAL LOW (ref 13.0–17.0)

## 2018-12-07 LAB — OCCULT BLOOD X 1 CARD TO LAB, STOOL: Fecal Occult Bld: POSITIVE — AB

## 2018-12-07 SURGERY — EGD (ESOPHAGOGASTRODUODENOSCOPY)
Anesthesia: General

## 2018-12-07 MED ORDER — LIDOCAINE HCL (PF) 2 % IJ SOLN
INTRAMUSCULAR | Status: DC | PRN
  Administered 2018-12-07: 100 mg via INTRADERMAL

## 2018-12-07 MED ORDER — PROPOFOL 10 MG/ML IV BOLUS
INTRAVENOUS | Status: DC | PRN
  Administered 2018-12-07 (×2): 20 mg via INTRAVENOUS

## 2018-12-07 MED ORDER — SODIUM CHLORIDE (PF) 0.9 % IJ SOLN
PREFILLED_SYRINGE | INTRAMUSCULAR | Status: DC | PRN
  Administered 2018-12-07: 2 mL

## 2018-12-07 MED ORDER — PROPOFOL 500 MG/50ML IV EMUL
INTRAVENOUS | Status: DC | PRN
  Administered 2018-12-07: 75 ug/kg/min via INTRAVENOUS

## 2018-12-07 NOTE — Anesthesia Preprocedure Evaluation (Addendum)
Anesthesia Evaluation  Patient identified by MRN, date of birth, ID band Patient awake    Reviewed: Allergy & Precautions, NPO status , Patient's Chart, lab work & pertinent test results, reviewed documented beta blocker date and time   History of Anesthesia Complications Negative for: history of anesthetic complications  Airway Mallampati: III  TM Distance: >3 FB     Dental  (+) Chipped, Upper Dentures, Partial Lower, Poor Dentition, Dental Advidsory Given   Pulmonary neg shortness of breath, sleep apnea , neg COPD, Recent URI ,           Cardiovascular hypertension, Pt. on medications and Pt. on home beta blockers (-) angina+ CAD and +CHF  (-) Past MI, (-) Cardiac Stents and (-) CABG + dysrhythmias Atrial Fibrillation + Valvular Problems/Murmurs (s/p MVR)   EF on most recent ECHO (1/20) showed EF of <20%   Neuro/Psych neg Seizures  Neuromuscular disease    GI/Hepatic Neg liver ROS, PUD, GERD  ,  Endo/Other  negative endocrine ROS  Renal/GU negative Renal ROS     Musculoskeletal   Abdominal   Peds  Hematology  (+) Blood dyscrasia, anemia ,   Anesthesia Other Findings Past Medical History: No date: Blind right eye No date: Diaphragmatic hernia without mention of obstruction or  gangrene No date: Diverticulosis of colon (without mention of hemorrhage) No date: Dizziness and giddiness No date: Esophageal reflux 12/18/08: Gastritis     Comment:  EGD, 3 clips remaining (Dr. Vira Agar) No date: Hypertension No date: Iron deficiency anemia     Comment:  a.  Secondary to recurrent GI bleeding No date: Macular degeneration No date: Nonischemic cardiomyopathy ([place])     Comment:  a.  TTE and 3/15: EF 35 to 40%, mild left ear, mitral               valve repair with mild to moderate MR, mild to moderate               TR, PASP 48; b.  TTE 3/18: EF 25 to 30%, not technically               sufficient to allow for LV diastolic  function, mild left               ear possibly underestimated secondary to degree of CM,               moderate AI calcified mitral annulus, moderate MR mod to               sev dilated LA mod dilated RA moderate TR PASP 71 No date: Persistent atrial fibrillation     Comment:  a.  Status post multiple cardioversions; b.  Previously               on Coumadin though held secondary to recurrent GI bleed No date: Personal history of peptic ulcer disease No date: Pure hypercholesterolemia No date: Sprain of neck No date: Unspecified hemorrhoids without mention of complication No date: Unspecified sleep apnea 7/15-7/20/09: Upper GI bleed     Comment:  a.  Recurrent GI bleed dating back to at least 2009 with              subsequent bleed in 2010, multiple GI bleeds in 01/2018   Reproductive/Obstetrics                            Anesthesia Physical  Anesthesia Plan  ASA: III  Anesthesia Plan: General   Post-op Pain Management:    Induction: Intravenous  PONV Risk Score and Plan: 2 and Propofol infusion and TIVA  Airway Management Planned: Nasal Cannula and Natural Airway  Additional Equipment:   Intra-op Plan:   Post-operative Plan:   Informed Consent: I have reviewed the patients History and Physical, chart, labs and discussed the procedure including the risks, benefits and alternatives for the proposed anesthesia with the patient or authorized representative who has indicated his/her understanding and acceptance.       Plan Discussed with: CRNA  Anesthesia Plan Comments:         Anesthesia Quick Evaluation

## 2018-12-07 NOTE — Interval H&P Note (Signed)
History and Physical Interval Note:  12/07/2018 10:31 AM  [name]  has presented today for surgery, with the diagnosis of Melena  The various methods of treatment have been discussed with the patient and family. After consideration of risks, benefits and other options for treatment, the patient has consented to  Procedure(s): ESOPHAGOGASTRODUODENOSCOPY (EGD) (N/A) as a surgical intervention .  The patient's history has been reviewed, patient examined, no change in status, stable for surgery.  I have reviewed the patient's chart and labs.  Questions were answered to the patient's satisfaction.     [place], [place]

## 2018-12-07 NOTE — Anesthesia Procedure Notes (Signed)
Date/Time: 12/07/2018 10:52 AM Performed by: Nelda Marseille, CRNA Pre-anesthesia Checklist: Patient identified, Emergency Drugs available, Suction available, Patient being monitored and Timeout performed Oxygen Delivery Method: Nasal cannula

## 2018-12-07 NOTE — Op Note (Signed)
[facility] Gastroenterology Patient Name: [name] Procedure Date: 12/07/2018 10:14 AM MRN: [id number] Account #: [id number] Date of Birth: September 20, 1933 Admit Type: Inpatient Age: 85 Room: [facility] ENDO ROOM 1 Gender: Male Note Status: Finalized Procedure:            Upper GI endoscopy Indications:          Acute post hemorrhagic anemia, Melena Providers:            Benay Pike. Alice Reichert MD, MD Referring MD:         Elveria Rising. Damita Dunnings, MD (Referring MD) Medicines:            Propofol per Anesthesia Complications:        No immediate complications. Procedure:            Pre-Anesthesia Assessment:                       - The risks and benefits of the procedure and the                        sedation options and risks were discussed with the                        patient. All questions were answered and informed                        consent was obtained.                       - Patient identification and proposed procedure were                        verified prior to the procedure by the nurse. The                        procedure was verified in the procedure room.                       - ASA Grade Assessment: III - A patient with severe                        systemic disease.                       - After reviewing the risks and benefits, the patient                        was deemed in satisfactory condition to undergo the                        procedure.                       After obtaining informed consent, the endoscope was                        passed under direct vision. Throughout the procedure,                        the patient's blood pressure, pulse, and oxygen  saturations were monitored continuously. The Endoscope                        was introduced through the mouth, and advanced to the                        third part of duodenum. The upper GI endoscopy was                        accomplished without difficulty. The  patient tolerated                        the procedure well. Findings:      The examined esophagus was normal.      Multiple 2 to 6 mm bleeding angioectasias were found at the anastomosis.       Area was unsuccessfully injected with 2 mL of a 1:10,000 solution of       epinephrine for hemostasis. Coagulation for hemostasis using bipolar       probe was unsuccessful. Coagulation for hemostasis using argon plasma at       1 liter/minute and 20 watts was successful. Estimated blood loss was       minimal.      Evidence of a patent Billroth II gastrojejunostomy was found. This was       traversed. The efferent limb was examined. The afferent limb was       examined 5 cm from anastomosis and was characterized by healthy       appearing mucosa.      The cardia and gastric fundus were normal on retroflexion.      The examined jejunum was normal. Impression:           - Normal esophagus.                       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                        Treatment not successful. Treatment not successful.                        Treated with bipolar cautery. Treated with argon plasma                        coagulation (APC).                       - Patent Billroth II gastrojejunostomy was found.                       - Normal examined jejunum.                       - No specimens collected. Recommendation:       - Return patient to hospital ward for observation.                       - Clear liquid diet. Procedure Code(s):    --- Professional ---                       [id number], Esophagogastroduodenoscopy, flexible, transoral;  with control of bleeding, any method Diagnosis Code(s):    --- Professional ---                       K92.1, Melena (includes Hematochezia)                       D62, Acute posthemorrhagic anemia                       Z98.0, Intestinal bypass and anastomosis status                       K31.811, Angiodysplasia of stomach and  duodenum with                        bleeding CPT copyright 2018 American Medical Association. All rights reserved. The codes documented in this report are preliminary and upon coder review may  be revised to meet current compliance requirements. Efrain Sella MD, MD 12/07/2018 11:18:59 AM This report has been signed electronically. Number of Addenda: 0 Note Initiated On: 12/07/2018 10:14 AM      [facility]

## 2018-12-07 NOTE — Anesthesia Postprocedure Evaluation (Signed)
Anesthesia Post Note  Patient: [name]  Procedure(s) Performed: ESOPHAGOGASTRODUODENOSCOPY (EGD) (N/A )  Patient location during evaluation: Endoscopy Anesthesia Type: General Level of consciousness: awake and alert Pain management: pain level controlled Vital Signs Assessment: post-procedure vital signs reviewed and stable Respiratory status: spontaneous breathing, nonlabored ventilation, respiratory function stable and patient connected to nasal cannula oxygen Cardiovascular status: blood pressure returned to baseline and stable Postop Assessment: no apparent nausea or vomiting Anesthetic complications: no     Last Vitals:  Vitals:   12/07/18 1226 12/07/18 1300  BP: (!) 141/100 133/90  Pulse: 71   Resp:    Temp: (!) 36.3 C   SpO2: 99%     Last Pain:  Vitals:   12/07/18 1226  TempSrc: Oral  PainSc:                  Martha Clan

## 2018-12-07 NOTE — Transfer of Care (Signed)
Immediate Anesthesia Transfer of Care Note  Patient: [name]  Procedure(s) Performed: ESOPHAGOGASTRODUODENOSCOPY (EGD) (N/A )  Patient Location: PACU  Anesthesia Type:General  Level of Consciousness: awake and oriented  Airway & Oxygen Therapy: Patient Spontanous Breathing and Patient connected to nasal cannula oxygen  Post-op Assessment: Report given to RN and Post -op Vital signs reviewed and stable  Post vital signs: Reviewed and stable  Last Vitals:  Vitals Value Taken Time  BP 117/81 12/07/2018 11:20 AM  Temp 36.1 C 12/07/2018 11:20 AM  Pulse 72 12/07/2018 11:25 AM  Resp 19 12/07/2018 11:25 AM  SpO2 100 % 12/07/2018 11:25 AM  Vitals shown include unvalidated device data.  Last Pain:  Vitals:   12/07/18 1120  TempSrc: Tympanic  PainSc: 0-No pain      Patients Stated Pain Goal: 0 (99/68/95 7022)  Complications: No apparent anesthesia complications

## 2018-12-07 NOTE — Anesthesia Post-op Follow-up Note (Signed)
Anesthesia QCDR form completed.        

## 2018-12-07 NOTE — Progress Notes (Signed)
[place] [facility] [place] NAME: [name]    MR#:  [id number]  DATE OF BIRTH:  05-26-1933  SUBJECTIVE:  CHIEF COMPLAINT:   Chief Complaint  Patient presents with  . Rectal Bleeding  no further bleeding. Doing well. S/p EGD earlier today and tolerating CLD REVIEW OF SYSTEMS:  Review of Systems  Constitutional: Negative for diaphoresis, fever, malaise/fatigue and weight loss.  HENT: Negative for ear discharge, ear pain, hearing loss, nosebleeds, sore throat and tinnitus.   Eyes: Negative for blurred vision and pain.  Respiratory: Negative for cough, hemoptysis, shortness of breath and wheezing.   Cardiovascular: Negative for chest pain, palpitations, orthopnea and leg swelling.  Gastrointestinal: Negative for abdominal pain, blood in stool, constipation, diarrhea, heartburn, nausea and vomiting.  Genitourinary: Negative for dysuria, frequency and urgency.  Musculoskeletal: Negative for back pain and myalgias.  Skin: Negative for itching and rash.  Neurological: Negative for dizziness, tingling, tremors, focal weakness, seizures, weakness and headaches.  Psychiatric/Behavioral: Negative for depression. The patient is not nervous/anxious.     DRUG ALLERGIES:   Allergies  Allergen Reactions  . Atorvastatin Other (See Comments)    Other reaction(s): Unknown REACTION:muscular soreness  . Statins     Muscle aches on mult statins   VITALS:  Blood pressure 133/90, pulse 71, temperature (!) 97.4 F (36.3 C), temperature source Oral, resp. rate 17, height 5\' 11"  (1.803 m), weight 74.8 kg, SpO2 99 %. PHYSICAL EXAMINATION:  Physical Exam HENT:     Head: Normocephalic and atraumatic.  Eyes:     Conjunctiva/sclera: Conjunctivae normal.     Pupils: Pupils are equal, round, and reactive to light.  Neck:     Musculoskeletal: Normal range of motion and neck supple.     Thyroid: No thyromegaly.     Trachea: No tracheal deviation.    Cardiovascular:     Rate and Rhythm: Normal rate and regular rhythm.     Heart sounds: Normal heart sounds.  Pulmonary:     Effort: Pulmonary effort is normal. No respiratory distress.     Breath sounds: Normal breath sounds. No wheezing.  Chest:     Chest wall: No tenderness.  Abdominal:     General: Bowel sounds are normal. There is no distension.     Palpations: Abdomen is soft.     Tenderness: There is no abdominal tenderness.  Musculoskeletal: Normal range of motion.  Skin:    General: Skin is warm and dry.     Findings: No rash.  Neurological:     Mental Status: He is alert and oriented to person, place, and time.     Cranial Nerves: No cranial nerve deficit.    LABORATORY PANEL:  Male CBC Recent Labs  Lab 12/07/18 0248  WBC 3.2*  HGB 8.7*  HCT 27.7*  PLT 165   ------------------------------------------------------------------------------------------------------------------ Chemistries  Recent Labs  Lab 12/06/18 1152 12/07/18 0248  NA 127* 128*  K 4.1 4.3  CL 94* 98  CO2 22 21*  GLUCOSE 142* 101*  BUN 12 10  CREATININE 0.73 0.70  CALCIUM 8.3* 8.0*  AST 23  --   ALT 13  --   ALKPHOS 57  --   BILITOT 0.9  --    RADIOLOGY:  No results found. ASSESSMENT AND PLAN:   *Acute recurrent melena - known history of gastritis, bleeding peptic ulcer disease - s/p EGD earlier today showing Multiple bleeding angioectasias in the stomach  - switch him to Protonix BID,  CBC daily, type and screen, transfuse as needed, avoid antiplatelet/anticoagulants/NSAID medications  *History of paroxysmal A. Fib Stable Not on oral anticoagulation due to history of GI bleeding Continue amiodarone, Toprol-XL  *Chronic BPH Continue Flomax  *Chronic iron and B12 deficiency anemia Exacerbated by acute recurrent melena Continue iron and B12 supplementation All other plans as stated above  *Chronic hypertension Stable Continue lisinopril, Toprol-XL  *Acute  hyponatremia/hypochloremia IV fluids for rehydration, BMP in the morning     All the records are reviewed and case discussed with Care Management/Social Worker. Management plans discussed with the patient, family (d/w son over phone) and they are in agreement.  CODE STATUS: Full Code  TOTAL TIME TAKING CARE OF THIS PATIENT: 35 minutes.   More than 50% of the time was spent in counseling/coordination of care: YES  POSSIBLE D/C IN 1 DAYS, DEPENDING ON CLINICAL CONDITION.   Max Sane M.D on 12/07/2018 at 3:14 PM  Between 7am to 6pm - Pager - [phone]  After 6pm go to [email or web address] - password [email or web address] [facility]  [facility]  Office  [phone]  CC: Primary care physician; Tonia Ghent, MD  Note: This dictation was prepared with Dragon dictation along with smaller phrase technology. Any transcriptional errors that result from this process are unintentional.

## 2018-12-08 LAB — BASIC METABOLIC PANEL
Anion gap: 3 — ABNORMAL LOW (ref 5–15)
BUN: 10 mg/dL (ref 8–23)
CO2: 23 mmol/L (ref 22–32)
Calcium: 7.7 mg/dL — ABNORMAL LOW (ref 8.9–10.3)
Chloride: 101 mmol/L (ref 98–111)
Creatinine, Ser: 0.58 mg/dL — ABNORMAL LOW (ref 0.61–1.24)
GFR calc non Af Amer: >60 mL/min (ref >60)
Glucose, Bld: 95 mg/dL (ref 70–99)
Potassium: 3.9 mmol/L (ref 3.5–5.1)
Sodium: 127 mmol/L — ABNORMAL LOW (ref 135–145)

## 2018-12-08 LAB — CBC
HCT: 28.9 % — ABNORMAL LOW (ref 39.0–52.0)
Hemoglobin: 9 g/dL — ABNORMAL LOW (ref 13.0–17.0)
MCH: 23.8 pg — ABNORMAL LOW (ref 26.0–34.0)
MCHC: 31.1 g/dL (ref 30.0–36.0)
MCV: 76.5 fL — AB (ref 80.0–100.0)
Platelets: 184 10*3/uL (ref 150–400)
RBC: 3.78 MIL/uL — ABNORMAL LOW (ref 4.22–5.81)
RDW: 19.1 % — ABNORMAL HIGH (ref 11.5–15.5)
WBC: 4.5 10*3/uL (ref 4.0–10.5)
nRBC: 0 % (ref 0.0–0.2)

## 2018-12-08 MED ORDER — ONDANSETRON HCL 4 MG PO TABS: 4.0000 mg | ORAL_TABLET | Freq: Four times a day (QID) | ORAL | 0 refills | Status: DC | PRN

## 2018-12-08 MED ORDER — PANTOPRAZOLE SODIUM 40 MG PO TBEC: 40.0000 mg | DELAYED_RELEASE_TABLET | Freq: Two times a day (BID) | ORAL | 0 refills | Status: DC

## 2018-12-08 NOTE — Progress Notes (Signed)
Patients ride was at the visitor entrance. I wheeled the patient to the car and went over the discharge instructions with the driver/son and the patient

## 2018-12-08 NOTE — Care Management Important Message (Signed)
Copy of signed Medicare IM left with patient in room. 

## 2018-12-08 NOTE — Discharge Instructions (Signed)
New medications: Take Protonix (pantoprazole) 40 mg by mouth twice daily. Continue taking this medication until told to stop by gastroenterology.  For nausea, take Zofran only as needed. For the next few days try to eat a bland diet and add in your normal foods slowly as you can tolerate them.   Follow-up at [facility] within 2 weeks. We will attempt to make an appointment for you  Follow-up with your primary care doctor within 5 days. We will attempt to make an appointment for you.  AVOID these medications and medications like them: aspirin, BC or Goody's powders, ibuprofen and other non-steroidal inflammatory medications (including names like Advil, Aleve, Motrin, Naproxen, Meloxicam).  For pain, stick to using Tylenol as needed based on the instructions on the bottle.

## 2018-12-08 NOTE — Discharge Summary (Signed)
[name] at [place] NAME: [name]    MR#:  [id number]  DATE OF BIRTH:  [birth year]  DATE OF ADMISSION:  12/06/2018   ADMITTING PHYSICIAN: Gorden Harms, MD  DATE OF DISCHARGE: 12/08/2018  PRIMARY CARE PHYSICIAN: Tonia Ghent, MD   ADMISSION DIAGNOSIS:  Rectal bleeding [K62.5] DISCHARGE DIAGNOSIS:  Active Problems:   ANEMIA, IRON DEFICIENCY  SECONDARY DIAGNOSIS:   Past Medical History:  Diagnosis Date  . Blind right eye   . Diaphragmatic hernia without mention of obstruction or gangrene   . Diverticulosis of colon (without mention of hemorrhage)   . Dizziness and giddiness   . Esophageal reflux   . Gastritis 12/18/08   EGD, 3 clips remaining (Dr. Vira Agar)  . Hypertension   . Iron deficiency anemia    a.  Secondary to recurrent GI bleeding  . Macular degeneration   . Nonischemic cardiomyopathy (HCC)    a.  TTE and 3/15: EF 35 to 40%, mild left ear, mitral valve repair with mild to moderate MR, mild to moderate TR, PASP 48; b.  TTE 3/18: EF 25 to 30%, not technically sufficient to allow for LV diastolic function, mild left ear possibly underestimated secondary to degree of CM, moderate AI calcified mitral annulus, moderate MR mod to sev dilated LA mod dilated RA moderate TR PASP 71  . Persistent atrial fibrillation    a.  Status post multiple cardioversions; b.  Previously on Coumadin though held secondary to recurrent GI bleed  . Personal history of peptic ulcer disease   . Pure hypercholesterolemia   . Sprain of neck   . Unspecified hemorrhoids without mention of complication   . Unspecified sleep apnea   . Upper GI bleed 7/15-7/20/09   a.  Recurrent GI bleed dating back to at least 2009 with subsequent bleed in 2010, multiple GI bleeds in 01/2018   HOSPITAL COURSE:  [name]  is a 85 y.o. male with a known history that includes gastritis, GERD, iron deficiency anemia, hemorrhoids, upper GI bleed, and  peptic ulcer disease, presenting with 3-week period of dark bloody bowel movements. In the emergency room patient was found to have hemoglobin of 9.6, sodium 127, chloride 94, patient evaluated by Dr. Toledo/gastroenterology in the emergency room for acute melena, EGD completed 12/07/2018.   Plan:  *Acute recurrent melena Known history of gastritis, bleeding peptic ulcer disease. Family history per patient of angiectasias in multiple family members.  - s/p EGD 12/07/2018 showing multiple bleeding angioectasias in the stomach  On the day of discharge no recurrent melena. No hematochezia. Normal BM.  Some nausea with first meal after clear liquid diet, with associated gas. Resolved.  -  Protonix BID, while admitted CBC daily, type and screen, transfuse as needed -  avoid antiplatelet/anticoagulants/NSAID medications while admitted and on discharge. Patient counseled at length.  - follow-up outpatient GI scheduled with Dr. Alice Reichert within 2 weeks. - Per GI: Try to avoid hot, spicy, greasy, fatty foods, tomato-based food products to prevent GERD exacerbation   *History of paroxysmal A. Fib Stable Not on oral anticoagulation due to history of GI bleeding Continue amiodarone, Toprol-XL  *Chronic BPH Continue Flomax  *Chronic iron and B12 deficiency anemia Exacerbated by acute recurrent melena Continue iron and B12 supplementation Follow up with PCP for repeat CBC, within 5 days  *Chronic hypertension  Stable Continue lisinopril, Toprol-XL  *Acute hyponatremia/hypochloremia IV fluids for rehydration, BMP monitored. He resumed oral intake.  Repeat BMP  with PCP followup, within 5 days as above.  *Gait instability Progressive with age. In recent years has also stopped driving due to declining sight. Had a cane prior to admission. Ordered a rolling walker as well as HH PT to increase strength and facilitate home safety after discussing home safety with the patient.   Personally ambulated  him in the hallway today, some instability noted with cane.   *DVT prophylaxis SCDs. No anticoagulation while admitted.  DISCHARGE CONDITIONS:  stable CONSULTS OBTAINED:  Treatment Team:  Efrain Sella, MD DRUG ALLERGIES:   Allergies  Allergen Reactions  . Atorvastatin Other (See Comments)    Other reaction(s): Unknown REACTION:muscular soreness  . Statins     Muscle aches on mult statins   DISCHARGE MEDICATIONS:   Allergies as of 12/08/2018      Reactions   Atorvastatin Other (See Comments)   Other reaction(s): Unknown REACTION:muscular soreness   Statins    Muscle aches on mult statins      Medication List    TAKE these medications   amiodarone 200 MG tablet Commonly known as:  PACERONE TAKE 1 TABLET BY MOUTH ONCE DAILY   cholecalciferol 1000 units tablet Commonly known as:  VITAMIN D Take 1,000 Units by mouth daily.   cyanocobalamin 100 MCG tablet Take 100 mcg by mouth daily.   eplerenone 25 MG tablet Commonly known as:  INSPRA Take 25 mg by mouth daily.   ferrous sulfate 325 (65 FE) MG tablet Take 325 mg by mouth 2 (two) times daily.   hydroxypropyl methylcellulose / hypromellose 2.5 % ophthalmic solution Commonly known as:  ISOPTO TEARS / GONIOVISC Place 1 drop into both eyes every morning.   lisinopril 2.5 MG tablet Commonly known as:  PRINIVIL,ZESTRIL Take 1 tablet (2.5 mg total) by mouth daily.   metoprolol succinate 25 MG 24 hr tablet Commonly known as:  Toprol XL Take 1 tablet (25 mg total) by mouth daily.   ondansetron 4 MG tablet Commonly known as:  ZOFRAN Take 1 tablet (4 mg total) by mouth every 6 (six) hours as needed for up to 20 doses for nausea.   pantoprazole 40 MG tablet Commonly known as:  Protonix Take 1 tablet (40 mg total) by mouth 2 (two) times daily for 30 days.   tamsulosin 0.4 MG Caps capsule Commonly known as:  FLOMAX TAKE 1 CAPSULE BY MOUTH ONCE DAILY            Durable Medical Equipment  (From  admission, onward)         Start     Ordered   12/08/18 1308  For home use only DME Walker rolling  [facility])  Once    Question Answer Comment  Patient needs a walker to treat with the following condition Physical deconditioning   Patient needs a walker to treat with the following condition Balance problems      12/08/18 1308           DISCHARGE INSTRUCTIONS:   DIET:  Regular diet DISCHARGE CONDITION:  Stable ACTIVITY:  Activity as tolerated OXYGEN:  Home Oxygen: No.  Oxygen Delivery: room air DISCHARGE LOCATION:  Home with [facility] [facility]    If you experience worsening of your admission symptoms, develop shortness of breath, life threatening emergency, suicidal or homicidal thoughts you must seek medical attention immediately by calling 911 or calling your MD immediately if your symptoms are severe.  You Must read complete instructions/literature along with all the possible adverse reactions/side effects for all  the medicines you take and that have been prescribed to you. Take any new medicines only after you have completely understood and accept all the possible adverse reactions/side effects.   Please note  You were cared for by a hospitalist during your hospital stay. If you have any questions about your discharge medications or the care you received while you were in the hospital after you are discharged, you can call the unit and asked to speak with the hospitalist on call if the hospitalist that took care of you is not available. Once you are discharged, your primary care physician will handle any further medical issues. Please note that NO REFILLS for any discharge medications will be authorized once you are discharged, as it is imperative that you return to your primary care physician (or establish a relationship with a primary care physician if you do not have one) for your aftercare needs so that they can reassess your need for medications and monitor your lab values.   On  the day of Discharge:  VITAL SIGNS:  Blood pressure 115/61, pulse 69, temperature 98 F (36.7 C), temperature source Oral, resp. rate 16, height 5\' 11"  (1.803 m), weight 74.8 kg, SpO2 99 %. PHYSICAL EXAMINATION:  Physical Exam HENT:     Head: Normocephalic and atraumatic.  Eyes:     Conjunctiva/sclera: Conjunctivae normal.     Pupils: Pupils are equal, round, and reactive to light.  Neck:     Musculoskeletal: Normal range of motion and neck supple.     Thyroid: No thyromegaly.     Trachea: No tracheal deviation.  Cardiovascular:     Rate and Rhythm: Normal rate and regular rhythm.     Heart sounds: Normal heart sounds.  Pulmonary:     Effort: Pulmonary effort is normal. No respiratory distress.     Breath sounds: Normal breath sounds. No wheezing.  Chest:     Chest wall: No tenderness.  Abdominal:     General: Bowel sounds are normal. There is no distension.     Palpations: Abdomen is soft.     Tenderness: There is no abdominal tenderness.  Musculoskeletal: Normal range of motion.  Skin:    General: Skin is warm and dry.     Findings: No rash.  Neurological:     Mental Status: He is alert and oriented to person, place, and time.     Cranial Nerves: No cranial nerve deficit.  DATA REVIEW:   CBC Recent Labs  Lab 12/08/18 0438  WBC 4.5  HGB 9.0*  HCT 28.9*  PLT 184    Chemistries  Recent Labs  Lab 12/06/18 1152  12/08/18 0438  NA 127*   < > 127*  K 4.1   < > 3.9  CL 94*   < > 101  CO2 22   < > 23  GLUCOSE 142*   < > 95  BUN 12   < > 10  CREATININE 0.73   < > 0.58*  CALCIUM 8.3*   < > 7.7*  AST 23  --   --   ALT 13  --   --   ALKPHOS 57  --   --   BILITOT 0.9  --   --    < > = values in this interval not displayed.     Microbiology Results  No results found for this or any previous visit.  RADIOLOGY:  EGD 12/07/2018- IMPRESSIONS - Normal esophagus. 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 Treatment not successful.  Treatment not successful.  Treated with bipolar cautery. Treated with argon plasma coagulation (APC). - Patent Billroth II gastrojejunostomy was found. - Normal examined jejunum. - No specimens collected.  Management plans discussed with the patient and/or family and they are in agreement.  CODE STATUS: Full Code   TOTAL TIME TAKING CARE OF THIS PATIENT: 45 minutes.    Ripley Fraise PA-C on 12/08/2018 at 5:17 PM  Between 7am to 6pm - Pager - [phone]  After 6pm go to [email or web address] - Proofreader  [facility]  Office  [phone]  CC: Primary care physician; Tonia Ghent, MD

## 2018-12-08 NOTE — Care Management Note (Signed)
Case Management Note  Patient Details  Name: TEJAS SEAWOOD MRN: [id number] Date of Birth: [birth year]   Patient admitted from home with rectal bleed. Patient live at home with wife.  States that his son lives locally for transportation. PCP Damita Dunnings.  Pharmacy [facility].  Patient denies issues with transportation or with obtaining medication.  RW ordered, delivered to room by Leroy Sea from [facility].  Initially patient was agreeable to home health, and referral was made to [facility] with [facility],  However after liaison met with patient patient changed his mind and declined home health services.  RNCM confirmed with patient.  RNCM informed patient that should he change his mind he could follow up with his PCP   Subjective/Objective:                    Action/Plan:   Expected Discharge Date:  12/08/18               Expected Discharge Plan:  Home/Self Care  In-House Referral:     Discharge planning Services  CM Consult  Post Acute Care Choice:  Durable Medical Equipment, Home Health Choice offered to:  Patient  DME Arranged:  Walker rolling DME Agency:  [facility]  HH Arranged:  PT [place] Agency:  [facility]  Status of Service:  Completed, signed off  If discussed at [place] of Stay Meetings, dates discussed:    Additional Comments:  Beverly Sessions, RN 12/08/2018, 4:21 PM

## 2018-12-08 NOTE — Progress Notes (Signed)
GI Inpatient Follow-up Note  Subjective:  [name] seen in f/u today s/p EGD yesterday with Dr. Alice Reichert showing multiple bleeding angioectasias treated with APC. Patient is tolerating a regular heart healthy diet today with no recurrent bleeding. He reports he has had eggs, grits, hamburger patty, and some cake today. He denies nausea, vomiting, hematemesis, or abd pain. He reports one formed BM today that was brown. No hematochezia or melena. He feels good today with no complaints.   Scheduled Inpatient Medications:  . amiodarone  200 mg Oral Daily  . cholecalciferol  1,000 Units Oral Daily  . ferrous sulfate  325 mg Oral BID  . lisinopril  2.5 mg Oral Daily  . metoprolol succinate  25 mg Oral Daily  . [START ON 12/10/2018] pantoprazole  40 mg Intravenous Q12H  . polyvinyl alcohol  1 drop Both Eyes q morning - 10a  . spironolactone  25 mg Oral Daily  . tamsulosin  0.4 mg Oral Daily  . cyanocobalamin  100 mcg Oral Daily    Continuous Inpatient Infusions:    PRN Inpatient Medications:  acetaminophen **OR** acetaminophen, albuterol, HYDROcodone-acetaminophen, ondansetron **OR** ondansetron (ZOFRAN) IV  Review of Systems: Constitutional: Weight is stable.  Eyes: No changes in vision. ENT: No oral lesions, sore throat.  GI: see HPI.  Heme/Lymph: No easy bruising.  CV: No chest pain.  GU: No hematuria.  Integumentary: No rashes.  Neuro: No headaches.  Psych: No depression/anxiety.  Endocrine: No heat/cold intolerance.  Allergic/Immunologic: No urticaria.  Resp: No cough, SOB.  Musculoskeletal: No joint swelling.    Physical Examination: BP 115/61   Pulse 69   Temp 98 F (36.7 C) (Oral)   Resp 16   Ht 5\' 11"  (1.803 m)   Wt 74.8 kg   SpO2 99%   BMI 23.01 kg/m  Gen: NAD, alert and oriented x 4 HEENT: PEERLA, EOMI, Neck: supple, no JVD or thyromegaly Chest: CTA bilaterally, no wheezes, crackles, or other adventitious sounds CV: RRR, no m/g/c/r Abd: soft, no epigastric  tenderness, ND, +BS in all four quadrants; no HSM, guarding, ridigity, or rebound tenderness Ext: no edema, well perfused with 2+ pulses, Skin: no rash or lesions noted Lymph: no LAD  Data: Lab Results  Component Value Date   WBC 4.5 12/08/2018   HGB 9.0 (L) 12/08/2018   HCT 28.9 (L) 12/08/2018   MCV 76.5 (L) 12/08/2018   PLT 184 12/08/2018   Recent Labs  Lab 12/07/18 0248 12/07/18 1840 12/08/18 0438  HGB 8.7* 9.3* 9.0*   Lab Results  Component Value Date   NA 127 (L) 12/08/2018   K 3.9 12/08/2018   CL 101 12/08/2018   CO2 23 12/08/2018   BUN 10 12/08/2018   CREATININE 0.58 (L) 12/08/2018   Lab Results  Component Value Date   ALT 13 12/06/2018   AST 23 12/06/2018   ALKPHOS 57 12/06/2018   BILITOT 0.9 12/06/2018   Recent Labs  Lab 12/06/18 1618  APTT 30  INR 1.0   Assessment/Plan:  85 y/o Caucasian male seen in f/u for acute recurrent melena  1. Multiple bleeding AVMs treated successfully with APC 2. Hx of gastric ulcer s/p EGD with cautery hemostasis 01/2018  - Hemoglobin is stable at 9.0 with no evidence of recurrent bleeding. Normal BM today w/o melena - Pt is stable for discharge at this time - He should continue Protonix 40 mg BID for at least the next month and then f/u in office with GI in about 2 weeks -  He should have his hemoglobin rechecked in 1 week with PCP - Avoid NSAIDs/anticoagulation - Try to avoid hot, spicy, greasy, fatty foods, tomato-based food products    Please call with questions or concerns.    Octavia Bruckner, PA-C [place]  [phone]

## 2018-12-09 ENCOUNTER — Inpatient Hospital Stay
Admission: EM | Admit: 2018-12-09 | Discharge: 2018-12-10 | DRG: 378 | Disposition: A | Discharge status: Other Acute Inpatient Hospital | Payer: Medicare Other | Attending: Internal Medicine | Admitting: Internal Medicine

## 2018-12-09 ENCOUNTER — Other Ambulatory Visit: Payer: Self-pay

## 2018-12-09 ENCOUNTER — Encounter: Payer: Self-pay | Admitting: Emergency Medicine

## 2018-12-09 DIAGNOSIS — G4730 Sleep apnea, unspecified: Secondary | ICD-10-CM | POA: Diagnosis not present

## 2018-12-09 DIAGNOSIS — Z801 Family history of malignant neoplasm of trachea, bronchus and lung: Secondary | ICD-10-CM

## 2018-12-09 DIAGNOSIS — I10 Essential (primary) hypertension: Secondary | ICD-10-CM | POA: Diagnosis present

## 2018-12-09 DIAGNOSIS — I4819 Other persistent atrial fibrillation: Secondary | ICD-10-CM | POA: Diagnosis present

## 2018-12-09 DIAGNOSIS — H3530 Unspecified macular degeneration: Secondary | ICD-10-CM | POA: Diagnosis present

## 2018-12-09 DIAGNOSIS — Z903 Acquired absence of stomach [part of]: Secondary | ICD-10-CM

## 2018-12-09 DIAGNOSIS — R58 Hemorrhage, not elsewhere classified: Secondary | ICD-10-CM | POA: Diagnosis not present

## 2018-12-09 DIAGNOSIS — H5461 Unqualified visual loss, right eye, normal vision left eye: Secondary | ICD-10-CM | POA: Diagnosis present

## 2018-12-09 DIAGNOSIS — R001 Bradycardia, unspecified: Secondary | ICD-10-CM | POA: Diagnosis not present

## 2018-12-09 DIAGNOSIS — E7800 Pure hypercholesterolemia, unspecified: Secondary | ICD-10-CM | POA: Diagnosis not present

## 2018-12-09 DIAGNOSIS — K259 Gastric ulcer, unspecified as acute or chronic, without hemorrhage or perforation: Secondary | ICD-10-CM | POA: Diagnosis not present

## 2018-12-09 DIAGNOSIS — K31811 Angiodysplasia of stomach and duodenum with bleeding: Secondary | ICD-10-CM | POA: Diagnosis not present

## 2018-12-09 DIAGNOSIS — I959 Hypotension, unspecified: Secondary | ICD-10-CM | POA: Diagnosis not present

## 2018-12-09 DIAGNOSIS — Z980 Intestinal bypass and anastomosis status: Secondary | ICD-10-CM | POA: Diagnosis not present

## 2018-12-09 DIAGNOSIS — K219 Gastro-esophageal reflux disease without esophagitis: Secondary | ICD-10-CM | POA: Diagnosis present

## 2018-12-09 DIAGNOSIS — D649 Anemia, unspecified: Secondary | ICD-10-CM | POA: Diagnosis not present

## 2018-12-09 DIAGNOSIS — K922 Gastrointestinal hemorrhage, unspecified: Secondary | ICD-10-CM | POA: Diagnosis present

## 2018-12-09 DIAGNOSIS — Z8711 Personal history of peptic ulcer disease: Secondary | ICD-10-CM

## 2018-12-09 DIAGNOSIS — D62 Acute posthemorrhagic anemia: Secondary | ICD-10-CM | POA: Diagnosis not present

## 2018-12-09 DIAGNOSIS — K921 Melena: Secondary | ICD-10-CM | POA: Diagnosis not present

## 2018-12-09 DIAGNOSIS — R0902 Hypoxemia: Secondary | ICD-10-CM | POA: Diagnosis not present

## 2018-12-09 DIAGNOSIS — K3189 Other diseases of stomach and duodenum: Secondary | ICD-10-CM | POA: Diagnosis not present

## 2018-12-09 DIAGNOSIS — I499 Cardiac arrhythmia, unspecified: Secondary | ICD-10-CM | POA: Diagnosis not present

## 2018-12-09 LAB — CBC WITH DIFFERENTIAL/PLATELET
Abs Immature Granulocytes: 0.07 10*3/uL (ref 0.00–0.07)
Basophils Absolute: 0 10*3/uL (ref 0.0–0.1)
Basophils Relative: 0 %
Eosinophils Absolute: 0 10*3/uL (ref 0.0–0.5)
Eosinophils Relative: 0 %
HCT: 17.4 % — ABNORMAL LOW (ref 39.0–52.0)
Hemoglobin: 5.5 g/dL — ABNORMAL LOW (ref 13.0–17.0)
Immature Granulocytes: 1 %
Lymphocytes Relative: 12 %
Lymphs Abs: 0.9 10*3/uL (ref 0.7–4.0)
MCH: 24.2 pg — ABNORMAL LOW (ref 26.0–34.0)
MCHC: 31.6 g/dL (ref 30.0–36.0)
MCV: 76.7 fL — ABNORMAL LOW (ref 80.0–100.0)
Monocytes Absolute: 0.7 10*3/uL (ref 0.1–1.0)
Monocytes Relative: 9 %
Neutro Abs: 5.9 10*3/uL (ref 1.7–7.7)
Neutrophils Relative %: 78 %
PLATELETS: 179 10*3/uL (ref 150–400)
RBC: 2.27 MIL/uL — AB (ref 4.22–5.81)
RDW: 18.9 % — ABNORMAL HIGH (ref 11.5–15.5)
WBC: 7.6 10*3/uL (ref 4.0–10.5)
nRBC: 0 % (ref 0.0–0.2)

## 2018-12-09 LAB — BASIC METABOLIC PANEL
Anion gap: 7 (ref 5–15)
BUN: 19 mg/dL (ref 8–23)
CO2: 20 mmol/L — ABNORMAL LOW (ref 22–32)
Calcium: 6.4 mg/dL — CL (ref 8.9–10.3)
Chloride: 102 mmol/L (ref 98–111)
Creatinine, Ser: 0.6 mg/dL — ABNORMAL LOW (ref 0.61–1.24)
GFR calc Af Amer: >60 mL/min (ref >60)
GFR calc non Af Amer: >60 mL/min (ref >60)
Glucose, Bld: 112 mg/dL — ABNORMAL HIGH (ref 70–99)
POTASSIUM: 3.5 mmol/L (ref 3.5–5.1)
Sodium: 129 mmol/L — ABNORMAL LOW (ref 135–145)

## 2018-12-09 LAB — PREPARE RBC (CROSSMATCH)

## 2018-12-09 MED ORDER — CALCIUM GLUCONATE-NACL 1-0.675 GM/50ML-% IV SOLN
1.0000 g | Freq: Once | INTRAVENOUS | Status: AC
  Administered 2018-12-09: 1000 mg via INTRAVENOUS
  Filled 2018-12-09: qty 50

## 2018-12-09 MED ORDER — SODIUM CHLORIDE 0.9 % IV SOLN
80.0000 mg | Freq: Once | INTRAVENOUS | Status: AC
  Administered 2018-12-09: 80 mg via INTRAVENOUS
  Filled 2018-12-09 (×2): qty 80

## 2018-12-09 MED ORDER — SODIUM CHLORIDE 0.9 % IV SOLN
10.0000 mL/h | Freq: Once | INTRAVENOUS | Status: AC
  Administered 2018-12-09: 10 mL/h via INTRAVENOUS

## 2018-12-09 MED ORDER — SODIUM CHLORIDE 0.9 % IV SOLN
8.0000 mg/h | INTRAVENOUS | Status: DC
  Administered 2018-12-09 – 2018-12-10 (×3): 8 mg/h via INTRAVENOUS
  Filled 2018-12-09 (×3): qty 80

## 2018-12-09 MED ORDER — ONDANSETRON HCL 4 MG/2ML IJ SOLN
4.0000 mg | Freq: Four times a day (QID) | INTRAMUSCULAR | Status: DC | PRN
  Administered 2018-12-09 – 2018-12-10 (×3): 4 mg via INTRAVENOUS
  Filled 2018-12-09 (×2): qty 2

## 2018-12-09 MED ORDER — SODIUM CHLORIDE 0.9 % IV SOLN: 1.0000 g | Freq: Once | INTRAVENOUS | Status: DC

## 2018-12-09 MED ORDER — ACETAMINOPHEN 650 MG RE SUPP: 650.0000 mg | Freq: Four times a day (QID) | RECTAL | Status: DC | PRN

## 2018-12-09 MED ORDER — ACETAMINOPHEN 325 MG PO TABS: 650.0000 mg | ORAL_TABLET | Freq: Four times a day (QID) | ORAL | Status: DC | PRN

## 2018-12-09 MED ORDER — PROMETHAZINE HCL 25 MG/ML IJ SOLN
12.5000 mg | Freq: Four times a day (QID) | INTRAMUSCULAR | Status: DC | PRN
  Administered 2018-12-09 – 2018-12-10 (×2): 12.5 mg via INTRAVENOUS
  Filled 2018-12-09 (×2): qty 1

## 2018-12-09 MED ORDER — ONDANSETRON HCL 4 MG PO TABS: 4.0000 mg | ORAL_TABLET | Freq: Four times a day (QID) | ORAL | Status: DC | PRN

## 2018-12-09 MED ORDER — PANTOPRAZOLE SODIUM 40 MG IV SOLR: 40.0000 mg | Freq: Two times a day (BID) | INTRAVENOUS | Status: DC

## 2018-12-09 NOTE — ED Provider Notes (Signed)
[facility] Emergency Department Provider Note  ____________________________________________   I have reviewed the triage vital signs and the nursing notes.   HISTORY  Chief Complaint Rectal Bleeding   History limited by: Not Limited   HPI [name] is a 85 y.o. male who presents to the emergency department today because of concerns for GI bleeding.  The patient was discharged from the hospital yesterday after admission for GI bleed.  He states he was doing okay at home until this afternoon when he felt nauseous.  He then vomited up blood and had a bloody bowel movement.  The patient states he does have some abdominal discomfort and weakness with this.  Denies any shortness of breath.  Denies any chest pain.   Per medical record review patient has a history of admission with discharge yesterday for GI bleed.   Past Medical History:  Diagnosis Date  . Blind right eye   . Diaphragmatic hernia without mention of obstruction or gangrene   . Diverticulosis of colon (without mention of hemorrhage)   . Dizziness and giddiness   . Esophageal reflux   . Gastritis 12/18/08   EGD, 3 clips remaining (Dr. Vira Agar)  . Hypertension   . Iron deficiency anemia    a.  Secondary to recurrent GI bleeding  . Macular degeneration   . Nonischemic cardiomyopathy (HCC)    a.  TTE and 3/15: EF 35 to 40%, mild left ear, mitral valve repair with mild to moderate MR, mild to moderate TR, PASP 48; b.  TTE 3/18: EF 25 to 30%, not technically sufficient to allow for LV diastolic function, mild left ear possibly underestimated secondary to degree of CM, moderate AI calcified mitral annulus, moderate MR mod to sev dilated LA mod dilated RA moderate TR PASP 71  . Persistent atrial fibrillation    a.  Status post multiple cardioversions; b.  Previously on Coumadin though held secondary to recurrent GI bleed  . Personal history of peptic ulcer disease   . Pure hypercholesterolemia    . Sprain of neck   . Unspecified hemorrhoids without mention of complication   . Unspecified sleep apnea   . Upper GI bleed 7/15-7/20/09   a.  Recurrent GI bleed dating back to at least 2009 with subsequent bleed in 2010, multiple GI bleeds in 01/2018    Patient Active Problem List   Diagnosis Date Noted  . Acute gastric ulcer with hemorrhage   . Stomach irritation   . Hyponatremia 01/07/2018  . Blood in stool 01/02/2018  . Rhinitis 11/04/2017  . Bilateral carotid bruits 11/17/2015  . H. pylori infection 10/26/2015  . Persistent atrial fibrillation 04/28/2015  . Medicare annual wellness visit, subsequent 01/31/2015  . Advance care planning 01/31/2015  . Long term current use of anticoagulant therapy 01/30/2015  . Abnormal TSH 02/16/2014  . Chronic systolic heart failure ([place]) 08/16/2013  . Fatigue 09/08/2012  . Cardiomyopathy, dilated, nonischemic ([place]) 12/19/2011  . Hypertension 01/20/2011  . ROSACEA 11/12/2009  . BPH with obstruction/lower urinary tract symptoms 09/03/2009  . MITRAL VALVE REPLACEMENT, HX OF 06/11/2009  . OTHER AND UNSPECIFIED MITRAL VALVE DISEASES 03/04/2009  . PEPTIC ULCER DISEASE, HX OF 02/27/2009  . ANEMIA, IRON DEFICIENCY 04/09/2008  . GERD 04/09/2008  . HYPERCHOLESTEROLEMIA 04/08/2008  . LEFT VENTRICULAR FAILURE 04/08/2008  . HEMORRHOIDS 04/08/2008  . HIATAL HERNIA 04/08/2008  . DIVERTICULOSIS, COLON 04/08/2008  . SLEEP APNEA 04/08/2008  . GASTROINTESTINAL HEMORRHAGE, HX OF 04/08/2008    Past Surgical History:  Procedure Laterality Date  . APPENDECTOMY    . CARDIAC CATHETERIZATION  1996   [facility]  . CARDIAC CATHETERIZATION    . CARDIAC CATHETERIZATION    . CARDIOVERSION  12/17/2011   Procedure: CARDIOVERSION;  Surgeon: Hillary Bow, MD;  Location: [place];  Service: Cardiovascular;  Laterality: N/A;  . CATARACT EXTRACTION  10/13/08   left  . CHOLECYSTECTOMY    . ESOPHAGOGASTRODUODENOSCOPY Left 01/17/2018   Procedure:  ESOPHAGOGASTRODUODENOSCOPY (EGD);  Surgeon: Virgel Manifold, MD;  Location[facility] ENDOSCOPY;  Service: Endoscopy;  Laterality: Left;  . ESOPHAGOGASTRODUODENOSCOPY N/A 12/07/2018   Procedure: ESOPHAGOGASTRODUODENOSCOPY (EGD);  Surgeon: Toledo, Benay Pike, MD;  Location: [facility] ENDOSCOPY;  Service: Gastroenterology;  Laterality: N/A;  . ESOPHAGOGASTRODUODENOSCOPY (EGD) WITH PROPOFOL N/A 01/04/2018   Procedure: ESOPHAGOGASTRODUODENOSCOPY (EGD) WITH PROPOFOL;  Surgeon: [name], MD;  Location: [facility] ENDOSCOPY;  Service: Endoscopy;  Laterality: N/A;  . MITRAL VALVE ANNULOPLASTY  1996  . MITRAL VALVE REPAIR  1995  . PARTIAL GASTRECTOMY      Prior to Admission medications   Medication Sig Start Date End Date Taking? Authorizing Provider  amiodarone (PACERONE) 200 MG tablet TAKE 1 TABLET BY MOUTH ONCE DAILY 11/08/17   Wellington Hampshire, MD  cholecalciferol (VITAMIN D) 1000 UNITS tablet Take 1,000 Units by mouth daily.    [provider]  cyanocobalamin 100 MCG tablet Take 100 mcg by mouth daily.      [provider]  eplerenone (INSPRA) 25 MG tablet Take 25 mg by mouth daily. 03/19/18   [provider]  ferrous sulfate 325 (65 FE) MG tablet Take 325 mg by mouth 2 (two) times daily.    [provider]  hydroxypropyl methylcellulose (ISOPTO TEARS) 2.5 % ophthalmic solution Place 1 drop into both eyes every morning.    [provider]  lisinopril (PRINIVIL,ZESTRIL) 2.5 MG tablet Take 1 tablet (2.5 mg total) by mouth daily. 10/23/18 01/21/19  Rise Mu, PA-C  metoprolol succinate (TOPROL XL) 25 MG 24 hr tablet Take 1 tablet (25 mg total) by mouth daily. 07/18/18   Wellington Hampshire, MD  ondansetron (ZOFRAN) 4 MG tablet Take 1 tablet (4 mg total) by mouth every 6 (six) hours as needed for up to 20 doses for nausea. 12/08/18   Lule, Parsons Chu, PA  pantoprazole (PROTONIX) 40 MG tablet Take 1 tablet (40 mg total) by mouth 2 (two) times daily for 30 days. 12/08/18 01/07/19  Ripley Fraise, PA  tamsulosin (FLOMAX) 0.4 MG CAPS capsule TAKE 1 CAPSULE BY MOUTH ONCE DAILY 10/30/18   Tonia Ghent, MD    Allergies Atorvastatin and Statins  Family History  Problem Relation Age of Onset  . Cancer Father        lung  . Colon cancer Neg Hx   . Prostate cancer Neg Hx     Social History Social History   Tobacco Use  . Smoking status: Never Smoker  . Smokeless tobacco: Never Used  Substance Use Topics  . Alcohol use: Yes    Comment: socially, beer   . Drug use: No    Review of Systems Constitutional: Positive for generalized weakness.  Eyes: No visual changes. ENT: No sore throat. Cardiovascular: Denies chest pain. Respiratory: Denies shortness of breath. Gastrointestinal: Positive for vomiting, abd discomfort. Bloody bowel movement.  Genitourinary: Negative for dysuria. Musculoskeletal: Negative for back pain. Skin: Negative for rash. Neurological: Negative for headaches, focal weakness or numbness.  ____________________________________________   PHYSICAL EXAM:  VITAL SIGNS: ED Triage Vitals  Enc Vitals Group     BP 12/09/18 1917 114/60     Pulse Rate 12/09/18 1917 76     Resp 12/09/18 1917 18     Temp 12/09/18 1917 97.6 F (36.4 C)     Temp Source 12/09/18 1917 Oral     SpO2 12/09/18 1917 100 %     Weight 12/09/18 1919 165 lb 2 oz (74.9 kg)     Height 12/09/18 1919 5\' 11"  (1.803 m)     Head Circumference --      Peak Flow --      Pain Score 12/09/18 1918 3   Constitutional: Alert and oriented.  Eyes: Conjunctivae are normal.  ENT      Head: Normocephalic and atraumatic.      Nose: No congestion/rhinnorhea.      Mouth/Throat: Mucous membranes are pale.      Neck: No stridor. Hematological/Lymphatic/Immunilogical: No cervical lymphadenopathy. Cardiovascular: Normal rate, regular rhythm.  No murmurs, rubs, or gallops.  Respiratory: Normal respiratory effort without tachypnea nor retractions. Breath sounds are clear and equal bilaterally.  No wheezes/rales/rhonchi. Gastrointestinal: Soft and non tender. No rebound. No guarding.  Genitourinary: Deferred Musculoskeletal: Normal range of motion in all extremities. No lower extremity edema. Neurologic:  Normal speech and language. No gross focal neurologic deficits are appreciated.  Skin:  Skin is warm, dry and intact. No rash noted. Psychiatric: Mood and affect are normal. Speech and behavior are normal. Patient exhibits appropriate insight and judgment.  ____________________________________________    LABS (pertinent positives/negatives)  BMP na 129, k 3.5, glu 112, cr 0.60, ca 6.4 CBC wbc 7.6, hgb 5.5, plt 179  ____________________________________________   EKG  None  ____________________________________________    RADIOLOGY  None   ____________________________________________   PROCEDURES  Procedures  CRITICAL CARE Performed by: Nance Pear   Total critical care time: 30 minutes  Critical care time was exclusive of separately billable procedures and treating other patients.  Critical care was necessary to treat or prevent imminent or life-threatening deterioration.  Critical care was time spent personally by me on the following activities: development of treatment plan with patient and/or surrogate as well as nursing, discussions with consultants, evaluation of patient's response to treatment, examination of patient, obtaining history from patient or surrogate, ordering and performing treatments and interventions, ordering and review of laboratory studies, ordering and review of radiographic studies, pulse oximetry and re-evaluation of patient's condition.  ____________________________________________   INITIAL IMPRESSION / ASSESSMENT AND PLAN / ED COURSE  Pertinent labs & imaging results that were available during my care of the patient were reviewed by me and considered in my medical decision making (see chart for details).  Patient  presented to the emergency department today because of concerns for bloody bowel movement as well as bloody emesis.  Patient was just discharged from the hospital yesterday secondary to GI bleed.  The patient had an EGD done at that time which showed angioectasias in the stomach. I do have concern that this is causing the bleed again. Patient was started on protonix. Blood work returned and showed a hemoglobin of 5.5.  I discussed this finding with the patient.  Did consent the patient for blood transfusion which I think he would benefit from at this time.  Discussed with Dr. Bonna Gains the GI physician on call.  At this point will try to stabilize patient for EGD tomorrow morning.  Will plan on admission to the hospital service.    ____________________________________________   FINAL CLINICAL IMPRESSION(S) /  ED DIAGNOSES  Final diagnoses:  Acute GI bleeding  Anemia, unspecified type  Hypocalcemia     Note: This dictation was prepared with Dragon dictation. Any transcriptional errors that result from this process are unintentional     Nance Pear, MD 12/09/18 [phone]

## 2018-12-09 NOTE — H&P (Signed)
[facility] [place] NAME: [name]    MR#:  [id number]  DATE OF BIRTH:  [birth year]  DATE OF ADMISSION:  12/09/2018  PRIMARY CARE PHYSICIAN: Tonia Ghent, MD   REQUESTING/REFERRING PHYSICIAN: Archie Balboa, MD  CHIEF COMPLAINT:   Chief Complaint  Patient presents with  . Rectal Bleeding    HISTORY OF PRESENT ILLNESS:  [name]  is a 85 y.o. male who presents with chief complaint as above.  Patient was just discharged home from our facility after intervention for severe GI bleed.  He had multiple angiectasia's in his upper GI tract and had intervention via EGD per gastroenterology.  He returns within about 24 hours with recurrent severe hematemesis and melena.  His hemoglobin has fallen back down to 5.5.  ED physician called GI physician on call to make them aware of the patient, and hospitalist were called for admission  PAST MEDICAL HISTORY:   Past Medical History:  Diagnosis Date  . Blind right eye   . Diaphragmatic hernia without mention of obstruction or gangrene   . Diverticulosis of colon (without mention of hemorrhage)   . Dizziness and giddiness   . Esophageal reflux   . Gastritis 12/18/08   EGD, 3 clips remaining (Dr. Vira Agar)  . Hypertension   . Iron deficiency anemia    a.  Secondary to recurrent GI bleeding  . Macular degeneration   . Nonischemic cardiomyopathy (HCC)    a.  TTE and 3/15: EF 35 to 40%, mild left ear, mitral valve repair with mild to moderate MR, mild to moderate TR, PASP 48; b.  TTE 3/18: EF 25 to 30%, not technically sufficient to allow for LV diastolic function, mild left ear possibly underestimated secondary to degree of CM, moderate AI calcified mitral annulus, moderate MR mod to sev dilated LA mod dilated RA moderate TR PASP 71  . Persistent atrial fibrillation    a.  Status post multiple cardioversions; b.  Previously on Coumadin though held secondary to recurrent GI bleed  .  Personal history of peptic ulcer disease   . Pure hypercholesterolemia   . Sprain of neck   . Unspecified hemorrhoids without mention of complication   . Unspecified sleep apnea   . Upper GI bleed 7/15-7/20/09   a.  Recurrent GI bleed dating back to at least 2009 with subsequent bleed in 2010, multiple GI bleeds in 01/2018     PAST SURGICAL HISTORY:   Past Surgical History:  Procedure Laterality Date  . APPENDECTOMY    . CARDIAC CATHETERIZATION  1996   [facility]  . CARDIAC CATHETERIZATION    . CARDIAC CATHETERIZATION    . CARDIOVERSION  12/17/2011   Procedure: CARDIOVERSION;  Surgeon: Hillary Bow, MD;  Location: [place];  Service: Cardiovascular;  Laterality: N/A;  . CATARACT EXTRACTION  10/13/08   left  . CHOLECYSTECTOMY    . ESOPHAGOGASTRODUODENOSCOPY Left 01/17/2018   Procedure: ESOPHAGOGASTRODUODENOSCOPY (EGD);  Surgeon: Virgel Manifold, MD;  Location[facility] ENDOSCOPY;  Service: Endoscopy;  Laterality: Left;  . ESOPHAGOGASTRODUODENOSCOPY N/A 12/07/2018   Procedure: ESOPHAGOGASTRODUODENOSCOPY (EGD);  Surgeon: Toledo, Benay Pike, MD;  Location: [facility] ENDOSCOPY;  Service: Gastroenterology;  Laterality: N/A;  . ESOPHAGOGASTRODUODENOSCOPY (EGD) WITH PROPOFOL N/A 01/04/2018   Procedure: ESOPHAGOGASTRODUODENOSCOPY (EGD) WITH PROPOFOL;  Surgeon: [name], MD;  Location: [facility] ENDOSCOPY;  Service: Endoscopy;  Laterality: N/A;  . MITRAL VALVE ANNULOPLASTY  1996  . MITRAL VALVE REPAIR  1995  . PARTIAL GASTRECTOMY  SOCIAL HISTORY:   Social History   Tobacco Use  . Smoking status: Never Smoker  . Smokeless tobacco: Never Used  Substance Use Topics  . Alcohol use: Yes    Comment: socially, beer      FAMILY HISTORY:   Family History  Problem Relation Age of Onset  . Cancer Father        lung  . Colon cancer Neg Hx   . Prostate cancer Neg Hx      DRUG ALLERGIES:   Allergies  Allergen Reactions  . Atorvastatin Other (See Comments)    Other reaction(s):  Unknown REACTION:muscular soreness  . Statins     Muscle aches on mult statins    MEDICATIONS AT HOME:   Prior to Admission medications   Medication Sig Start Date End Date Taking? Authorizing Provider  amiodarone (PACERONE) 200 MG tablet TAKE 1 TABLET BY MOUTH ONCE DAILY Patient taking differently: Take 200 mg by mouth daily.  11/08/17   Wellington Hampshire, MD  cholecalciferol (VITAMIN D) 1000 UNITS tablet Take 1,000 Units by mouth daily.    [provider]  cyanocobalamin 100 MCG tablet Take 100 mcg by mouth daily.      [provider]  eplerenone (INSPRA) 25 MG tablet Take 25 mg by mouth daily. 03/19/18   [provider]  ferrous sulfate 325 (65 FE) MG tablet Take 325 mg by mouth 2 (two) times daily.    [provider]  hydroxypropyl methylcellulose (ISOPTO TEARS) 2.5 % ophthalmic solution Place 1 drop into both eyes every morning.    [provider]  lisinopril (PRINIVIL,ZESTRIL) 2.5 MG tablet Take 1 tablet (2.5 mg total) by mouth daily. 10/23/18 01/21/19  Rise Mu, PA-C  metoprolol succinate (TOPROL XL) 25 MG 24 hr tablet Take 1 tablet (25 mg total) by mouth daily. 07/18/18   Wellington Hampshire, MD  ondansetron (ZOFRAN) 4 MG tablet Take 1 tablet (4 mg total) by mouth every 6 (six) hours as needed for up to 20 doses for nausea. 12/08/18   Lule, Sara Chu, PA  pantoprazole (PROTONIX) 40 MG tablet Take 1 tablet (40 mg total) by mouth 2 (two) times daily for 30 days. 12/08/18 01/07/19  Ripley Fraise, PA  tamsulosin (FLOMAX) 0.4 MG CAPS capsule TAKE 1 CAPSULE BY MOUTH ONCE DAILY Patient taking differently: Take 0.4 mg by mouth daily.  10/30/18   Tonia Ghent, MD    REVIEW OF SYSTEMS:  Review of Systems  Constitutional: Negative for chills, fever, malaise/fatigue and weight loss.  HENT: Negative for ear pain, hearing loss and tinnitus.   Eyes: Negative for blurred vision, double vision, pain and redness.  Respiratory: Negative for cough, hemoptysis and  shortness of breath.   Cardiovascular: Negative for chest pain, palpitations, orthopnea and leg swelling.  Gastrointestinal: Positive for melena, nausea and vomiting. Negative for abdominal pain, constipation and diarrhea.       Hematemesis  Genitourinary: Negative for dysuria, frequency and hematuria.  Musculoskeletal: Negative for back pain, joint pain and neck pain.  Skin:       No acne, rash, or lesions  Neurological: Negative for dizziness, tremors, focal weakness and weakness.  Endo/Heme/Allergies: Negative for polydipsia. Does not bruise/bleed easily.  Psychiatric/Behavioral: Negative for depression. The patient is not nervous/anxious and does not have insomnia.      VITAL SIGNS:   Vitals:   12/09/18 2100 12/09/18 2115 12/09/18 2125 12/09/18 2130  BP: (!) 116/55 (!) 102/49 (!) 102/48 (!) 100/51  Pulse: 76 68  73 72  Resp: (!) 21 14 18 14   Temp:   97.9 F (36.6 C)   TempSrc:   Oral   SpO2: 100% 98%  99%  Weight:      Height:       Wt Readings from Last 3 Encounters:  12/09/18 74.9 kg  12/06/18 74.8 kg  05/30/18 74.8 kg    PHYSICAL EXAMINATION:  Physical Exam  Vitals reviewed. Constitutional: He is oriented to person, place, and time. He appears well-developed and well-nourished. No distress.  HENT:  Head: Normocephalic and atraumatic.  Mouth/Throat: Oropharynx is clear and moist.  Eyes: Pupils are equal, round, and reactive to light. EOM are normal. No scleral icterus.  Pale conjunctiva  Neck: Normal range of motion. Neck supple. No JVD present. No thyromegaly present.  Cardiovascular: Normal rate, regular rhythm and intact distal pulses. Exam reveals no gallop and no friction rub.  No murmur heard. Respiratory: Effort normal and breath sounds normal. No respiratory distress. He has no wheezes. He has no rales.  GI: Soft. Bowel sounds are normal. He exhibits no distension. There is no abdominal tenderness.  Musculoskeletal: Normal range of motion.        General:  No edema.     Comments: No arthritis, no gout  Lymphadenopathy:    He has no cervical adenopathy.  Neurological: He is alert and oriented to person, place, and time. No cranial nerve deficit.  No dysarthria, no aphasia  Skin: Skin is warm and dry. No rash noted. No erythema.  Psychiatric: He has a normal mood and affect. His behavior is normal. Judgment and thought content normal.    LABORATORY PANEL:   CBC Recent Labs  Lab 12/09/18 1924  WBC 7.6  HGB 5.5*  HCT 17.4*  PLT 179   ------------------------------------------------------------------------------------------------------------------  Chemistries  Recent Labs  Lab 12/06/18 1152  12/09/18 1924  NA 127*   < > 129*  K 4.1   < > 3.5  CL 94*   < > 102  CO2 22   < > 20*  GLUCOSE 142*   < > 112*  BUN 12   < > 19  CREATININE 0.73   < > 0.60*  CALCIUM 8.3*   < > 6.4*  AST 23  --   --   ALT 13  --   --   ALKPHOS 57  --   --   BILITOT 0.9  --   --    < > = values in this interval not displayed.   ------------------------------------------------------------------------------------------------------------------  Cardiac Enzymes No results for input(s): TROPONINI in the last 168 hours. ------------------------------------------------------------------------------------------------------------------  RADIOLOGY:  No results found.  EKG:   Orders placed or performed in visit on 05/30/18  . EKG 12-Lead    IMPRESSION AND PLAN:  Principal Problem:   GI bleed -recurrent, hemoglobin is fallen to 5.5.  We will keep him n.p.o., start IV Protonix, give blood (see below), and get a GI consult Active Problems:   Acute blood loss anemia -2 units PRBC ordered, we will recheck his hemoglobin after that and see if he needs more   Hypertension -avoid antihypertensives for now as the patient's blood pressure is borderline low   Persistent atrial fibrillation -continue home meds once patient is able to take p.o.    HYPERCHOLESTEROLEMIA -home dose antilipid when he can take p.o.   GERD -PPI drip as above  Chart review performed and case discussed with ED provider. Labs, imaging and/or ECG reviewed by provider and discussed  with patient/family. Management plans discussed with the patient and/or family.  DVT PROPHYLAXIS: Mechanical only  GI PROPHYLAXIS:  PPI   ADMISSION STATUS: Inpatient     CODE STATUS: Full Code Status History    Date Active Date Inactive Code Status Order ID Comments User Context   12/06/2018 1836 12/08/2018 2036 Full Code [id number]  Gorden Harms, MD Inpatient   01/16/2018 1400 01/19/2018 1712 Full Code [id number]  Hillary Bow, MD ED   01/07/2018 1811 01/10/2018 1845 Full Code [id number]  Fritzi Mandes, MD Inpatient   01/02/2018 1732 01/05/2018 1515 Full Code [id number]  Vaughan Basta, MD ED    Advance Directive Documentation     Most Recent Value  Type of Advance Directive  Healthcare Power of [place], Living will  Pre-existing out of facility DNR order (yellow form or pink MOST form)  -  "MOST" Form in Place?  -      TOTAL TIME TAKING CARE OF THIS PATIENT: 45 minutes.   Ethlyn Daniels 12/09/2018, 9:39 PM  [facility]  Office  [phone]  CC: Primary care physician; Tonia Ghent, MD  Note:  This document was prepared using Dragon voice recognition software and may include unintentional dictation errors.

## 2018-12-09 NOTE — ED Notes (Signed)
ED TO INPATIENT HANDOFF REPORT  ED Nurse Name and Phone #: Lelon Frohlich [phone]  S Name/Age/Gender [name] 85 y.o. male Room/Bed: ED05A/ED05A  Code Status   Code Status: Prior  Home/SNF/Other Home {Patient alert and oriented  x4 Is this baseline? Yes   Triage Complete: Triage complete  Chief Complaint GI bleed  Triage Note EMS pt to rm 5 from home. Seen Wednesday for gi bleed. Scoped and discharged on Friday, One episode of dark tarry stool large amt this evening. EMS BP 90/58. Pulse 62. Gave 800 ml ns BP 109/62 pulse 63 temp 97.8 CBG 162.   Allergies Allergies  Allergen Reactions  . Atorvastatin Other (See Comments)    Other reaction(s): Unknown REACTION:muscular soreness  . Statins     Muscle aches on mult statins    Level of Care/Admitting Diagnosis ED Disposition    ED Disposition Condition [facility]: [place] [[id number]]  Level of Care: Med-Surg [16]  Diagnosis: GI bleed [[id number]]  Admitting Physician: Lance Coon [[id number]]  Attending Physician: Lance Coon [[id number]]  Estimated length of stay: past midnight tomorrow  Certification:: I certify this patient will need inpatient services for at least 2 midnights  PT Class (Do Not Modify): Inpatient [101]  PT Acc Code (Do Not Modify): Private [1]       B Medical/Surgery History Past Medical History:  Diagnosis Date  . Blind right eye   . Diaphragmatic hernia without mention of obstruction or gangrene   . Diverticulosis of colon (without mention of hemorrhage)   . Dizziness and giddiness   . Esophageal reflux   . Gastritis 12/18/08   EGD, 3 clips remaining (Dr. Vira Agar)  . Hypertension   . Iron deficiency anemia    a.  Secondary to recurrent GI bleeding  . Macular degeneration   . Nonischemic cardiomyopathy (HCC)    a.  TTE and 3/15: EF 35 to 40%, mild left ear, mitral valve repair with mild to moderate MR, mild to moderate TR, PASP 48; b.  TTE 3/18: EF  25 to 30%, not technically sufficient to allow for LV diastolic function, mild left ear possibly underestimated secondary to degree of CM, moderate AI calcified mitral annulus, moderate MR mod to sev dilated LA mod dilated RA moderate TR PASP 71  . Persistent atrial fibrillation    a.  Status post multiple cardioversions; b.  Previously on Coumadin though held secondary to recurrent GI bleed  . Personal history of peptic ulcer disease   . Pure hypercholesterolemia   . Sprain of neck   . Unspecified hemorrhoids without mention of complication   . Unspecified sleep apnea   . Upper GI bleed 7/15-7/20/09   a.  Recurrent GI bleed dating back to at least 2009 with subsequent bleed in 2010, multiple GI bleeds in 01/2018   Past Surgical History:  Procedure Laterality Date  . APPENDECTOMY    . CARDIAC CATHETERIZATION  1996   [facility]  . CARDIAC CATHETERIZATION    . CARDIAC CATHETERIZATION    . CARDIOVERSION  12/17/2011   Procedure: CARDIOVERSION;  Surgeon: Hillary Bow, MD;  Location: [place];  Service: Cardiovascular;  Laterality: N/A;  . CATARACT EXTRACTION  10/13/08   left  . CHOLECYSTECTOMY    . ESOPHAGOGASTRODUODENOSCOPY Left 01/17/2018   Procedure: ESOPHAGOGASTRODUODENOSCOPY (EGD);  Surgeon: Virgel Manifold, MD;  Location: [facility] ENDOSCOPY;  Service: Endoscopy;  Laterality: Left;  . ESOPHAGOGASTRODUODENOSCOPY N/A 12/07/2018   Procedure: ESOPHAGOGASTRODUODENOSCOPY (EGD);  Surgeon: [place],  Benay Pike, MD;  Location: [facility] ENDOSCOPY;  Service: Gastroenterology;  Laterality: N/A;  . ESOPHAGOGASTRODUODENOSCOPY (EGD) WITH PROPOFOL N/A 01/04/2018   Procedure: ESOPHAGOGASTRODUODENOSCOPY (EGD) WITH PROPOFOL;  Surgeon: [name], MD;  Location: [facility] ENDOSCOPY;  Service: Endoscopy;  Laterality: N/A;  . MITRAL VALVE ANNULOPLASTY  1996  . MITRAL VALVE REPAIR  1995  . PARTIAL GASTRECTOMY       A IV Location/Drains/Wounds Patient Lines/Drains/Airways Status   Active Line/Drains/Airways    Name:    Placement date:   Placement time:   Site:   Days:   Peripheral IV 12/09/18 Left Wrist   12/09/18    1942    Wrist   less than 1   Peripheral IV 12/09/18 Left Forearm   12/09/18    1943    Forearm   less than 1   Peripheral IV 12/09/18 Left;Medial Forearm   12/09/18    2122    Forearm   less than 1          Intake/Output Last 24 hours  Intake/Output Summary (Last 24 hours) at 12/09/2018 2159 Last data filed at 12/09/2018 2100 Gross per 24 hour  Intake 98.41 ml  Output -  Net 98.41 ml    Labs/Imaging Results for orders placed or performed during the hospital encounter of 12/09/18 (from the past 48 hour(s))  CBC with Differential     Status: Abnormal   Collection Time: 12/09/18  7:24 PM  Result Value Ref Range   WBC 7.6 4.0 - 10.5 K/uL   RBC 2.27 (L) 4.22 - 5.81 MIL/uL   Hemoglobin 5.5 (L) 13.0 - 17.0 g/dL    Comment: REPEATED TO VERIFY   HCT 17.4 (L) 39.0 - 52.0 %   MCV 76.7 (L) 80.0 - 100.0 fL   MCH 24.2 (L) 26.0 - 34.0 pg   MCHC 31.6 30.0 - 36.0 g/dL   RDW 18.9 (H) 11.5 - 15.5 %   Platelets 179 150 - 400 K/uL   nRBC 0.0 0.0 - 0.2 %   Neutrophils Relative % 78 %   Neutro Abs 5.9 1.7 - 7.7 K/uL   Lymphocytes Relative 12 %   Lymphs Abs 0.9 0.7 - 4.0 K/uL   Monocytes Relative 9 %   Monocytes Absolute 0.7 0.1 - 1.0 K/uL   Eosinophils Relative 0 %   Eosinophils Absolute 0.0 0.0 - 0.5 K/uL   Basophils Relative 0 %   Basophils Absolute 0.0 0.0 - 0.1 K/uL   Immature Granulocytes 1 %   Abs Immature Granulocytes 0.07 0.00 - 0.07 K/uL    Comment: Performed at [facility], [address] [place], [place] [address]  Basic metabolic panel     Status: Abnormal   Collection Time: 12/09/18  7:24 PM  Result Value Ref Range   Sodium 129 (L) 135 - 145 mmol/L   Potassium 3.5 3.5 - 5.1 mmol/L   Chloride 102 98 - 111 mmol/L   CO2 20 (L) 22 - 32 mmol/L   Glucose, Bld 112 (H) 70 - 99 mg/dL   BUN 19 8 - 23 mg/dL   Creatinine, Ser 0.60 (L) 0.61 - 1.24 mg/dL   Calcium 6.4 (LL)  8.9 - 10.3 mg/dL    Comment: CRITICAL RESULT CALLED TO, READ BACK BY AND VERIFIED WITH ANN Jammal Sarr 12/09/18 @ 2020  MLK    GFR calc non Af Amer >60 >60 mL/min   GFR calc Af Amer >60 >60 mL/min   Anion gap 7 5 - 15  Comment: Performed at [facility], [address] [place], [place] [address]  Type and screen [place]     Status: None (Preliminary result)   Collection Time: 12/09/18  8:19 PM  Result Value Ref Range   ABO/RH(D) O POS    Antibody Screen NEG    Sample Expiration 12/12/2018    Unit Number [id number]    Blood Component Type RED CELLS,LR    Unit division 00    Status of Unit ISSUED    Transfusion Status OK TO TRANSFUSE    Crossmatch Result      Compatible Performed at [facility], [address] [place], [place] [address]    Unit Number [id number]    Blood Component Type RED CELLS,LR    Unit division 00    Status of Unit ALLOCATED    Transfusion Status OK TO TRANSFUSE    Crossmatch Result Compatible   Prepare RBC     Status: None   Collection Time: 12/09/18  9:00 PM  Result Value Ref Range   Order Confirmation      ORDER PROCESSED BY BLOOD BANK Performed at [facility], [address] [place], [place] [address]    No results found.  Pending Labs [employer] (From admission, onward)    Start     Ordered   Signed and Held  CBC  Tomorrow morning,   R     Signed and Held   Signed and Held  Comprehensive metabolic panel  Tomorrow morning,   R     Signed and Held          Vitals/Pain Today's Vitals   12/09/18 2125 12/09/18 2130 12/09/18 2144 12/09/18 2145  BP: (!) 102/48 (!) 100/51 (!) 105/50 (!) 117/47  Pulse: 73 72 72 71  Resp: 18 14 14 16   Temp: 97.9 F (36.6 C)  98.1 F (36.7 C)   TempSrc: Oral  Oral   SpO2:  99% 100% 100%  Weight:      Height:      PainSc:        Isolation Precautions No active isolations  Medications Medications  pantoprazole (PROTONIX) 80 mg in sodium  chloride 0.9 % 250 mL (0.32 mg/mL) infusion (has no administration in time range)  pantoprazole (PROTONIX) injection 40 mg (has no administration in time range)  0.9 %  sodium chloride infusion (has no administration in time range)  calcium gluconate 1 g/ 50 mL sodium chloride IVPB (1,000 mg Intravenous New Bag/Given 12/09/18 2140)  ondansetron (ZOFRAN) tablet 4 mg ( Oral See Alternative 12/09/18 2148)    Or  ondansetron (ZOFRAN) injection 4 mg (4 mg Intravenous Given 12/09/18 2148)  pantoprazole (PROTONIX) 80 mg in sodium chloride 0.9 % 100 mL IVPB (0 mg Intravenous Stopped 12/09/18 2100)    Mobility walks with device High fall risk   Focused Assessments    R Recommendations: See Admitting Provider Note  Report given to:   Additional Notes: Inpatient from Wednesday to Friday with endoscopy.

## 2018-12-09 NOTE — ED Triage Notes (Signed)
EMS pt to rm 5 from home. Seen Wednesday for gi bleed. Scoped and discharged on Friday, One episode of dark tarry stool large amt this evening. EMS BP 90/58. Pulse 62. Gave 800 ml ns BP 109/62 pulse 63 temp 97.8 CBG 162.

## 2018-12-10 ENCOUNTER — Inpatient Hospital Stay: Payer: Medicare Other | Admitting: Anesthesiology

## 2018-12-10 ENCOUNTER — Encounter
Admission: EM | Disposition: A | Discharge status: Other Acute Inpatient Hospital | Payer: Self-pay | Source: Home / Self Care | Attending: Internal Medicine

## 2018-12-10 DIAGNOSIS — K922 Gastrointestinal hemorrhage, unspecified: Secondary | ICD-10-CM | POA: Diagnosis not present

## 2018-12-10 DIAGNOSIS — K254 Chronic or unspecified gastric ulcer with hemorrhage: Secondary | ICD-10-CM | POA: Diagnosis not present

## 2018-12-10 DIAGNOSIS — Z9049 Acquired absence of other specified parts of digestive tract: Secondary | ICD-10-CM | POA: Diagnosis not present

## 2018-12-10 DIAGNOSIS — K921 Melena: Secondary | ICD-10-CM

## 2018-12-10 DIAGNOSIS — I4819 Other persistent atrial fibrillation: Secondary | ICD-10-CM

## 2018-12-10 DIAGNOSIS — H04123 Dry eye syndrome of bilateral lacrimal glands: Secondary | ICD-10-CM | POA: Diagnosis not present

## 2018-12-10 DIAGNOSIS — R9431 Abnormal electrocardiogram [ECG] [EKG]: Secondary | ICD-10-CM | POA: Diagnosis not present

## 2018-12-10 DIAGNOSIS — I248 Other forms of acute ischemic heart disease: Secondary | ICD-10-CM | POA: Diagnosis not present

## 2018-12-10 DIAGNOSIS — R52 Pain, unspecified: Secondary | ICD-10-CM | POA: Diagnosis not present

## 2018-12-10 DIAGNOSIS — Z7901 Long term (current) use of anticoagulants: Secondary | ICD-10-CM | POA: Diagnosis not present

## 2018-12-10 DIAGNOSIS — I350 Nonrheumatic aortic (valve) stenosis: Secondary | ICD-10-CM | POA: Diagnosis not present

## 2018-12-10 DIAGNOSIS — R5381 Other malaise: Secondary | ICD-10-CM | POA: Diagnosis not present

## 2018-12-10 DIAGNOSIS — K259 Gastric ulcer, unspecified as acute or chronic, without hemorrhage or perforation: Secondary | ICD-10-CM | POA: Diagnosis not present

## 2018-12-10 DIAGNOSIS — K31811 Angiodysplasia of stomach and duodenum with bleeding: Secondary | ICD-10-CM | POA: Diagnosis not present

## 2018-12-10 DIAGNOSIS — R58 Hemorrhage, not elsewhere classified: Secondary | ICD-10-CM | POA: Diagnosis not present

## 2018-12-10 DIAGNOSIS — I480 Paroxysmal atrial fibrillation: Secondary | ICD-10-CM | POA: Diagnosis not present

## 2018-12-10 DIAGNOSIS — R269 Unspecified abnormalities of gait and mobility: Secondary | ICD-10-CM | POA: Diagnosis not present

## 2018-12-10 DIAGNOSIS — R000 Tachycardia, unspecified: Secondary | ICD-10-CM | POA: Diagnosis not present

## 2018-12-10 DIAGNOSIS — Z743 Need for continuous supervision: Secondary | ICD-10-CM | POA: Diagnosis not present

## 2018-12-10 DIAGNOSIS — I10 Essential (primary) hypertension: Secondary | ICD-10-CM | POA: Diagnosis not present

## 2018-12-10 DIAGNOSIS — K219 Gastro-esophageal reflux disease without esophagitis: Secondary | ICD-10-CM | POA: Diagnosis not present

## 2018-12-10 DIAGNOSIS — E440 Moderate protein-calorie malnutrition: Secondary | ICD-10-CM | POA: Diagnosis not present

## 2018-12-10 DIAGNOSIS — I110 Hypertensive heart disease with heart failure: Secondary | ICD-10-CM | POA: Diagnosis not present

## 2018-12-10 DIAGNOSIS — I499 Cardiac arrhythmia, unspecified: Secondary | ICD-10-CM | POA: Diagnosis not present

## 2018-12-10 DIAGNOSIS — I517 Cardiomegaly: Secondary | ICD-10-CM | POA: Diagnosis not present

## 2018-12-10 DIAGNOSIS — K3189 Other diseases of stomach and duodenum: Secondary | ICD-10-CM

## 2018-12-10 DIAGNOSIS — I429 Cardiomyopathy, unspecified: Secondary | ICD-10-CM | POA: Diagnosis not present

## 2018-12-10 DIAGNOSIS — I272 Pulmonary hypertension, unspecified: Secondary | ICD-10-CM | POA: Diagnosis not present

## 2018-12-10 DIAGNOSIS — I459 Conduction disorder, unspecified: Secondary | ICD-10-CM | POA: Diagnosis not present

## 2018-12-10 DIAGNOSIS — G4730 Sleep apnea, unspecified: Secondary | ICD-10-CM | POA: Diagnosis not present

## 2018-12-10 DIAGNOSIS — I471 Supraventricular tachycardia: Secondary | ICD-10-CM | POA: Diagnosis not present

## 2018-12-10 DIAGNOSIS — R011 Cardiac murmur, unspecified: Secondary | ICD-10-CM | POA: Diagnosis not present

## 2018-12-10 DIAGNOSIS — I222 Subsequent non-ST elevation (NSTEMI) myocardial infarction: Secondary | ICD-10-CM | POA: Diagnosis not present

## 2018-12-10 DIAGNOSIS — R339 Retention of urine, unspecified: Secondary | ICD-10-CM | POA: Diagnosis not present

## 2018-12-10 DIAGNOSIS — Z980 Intestinal bypass and anastomosis status: Secondary | ICD-10-CM | POA: Diagnosis not present

## 2018-12-10 DIAGNOSIS — I447 Left bundle-branch block, unspecified: Secondary | ICD-10-CM | POA: Diagnosis not present

## 2018-12-10 DIAGNOSIS — E871 Hypo-osmolality and hyponatremia: Secondary | ICD-10-CM | POA: Diagnosis not present

## 2018-12-10 DIAGNOSIS — I5030 Unspecified diastolic (congestive) heart failure: Secondary | ICD-10-CM | POA: Diagnosis not present

## 2018-12-10 DIAGNOSIS — R0789 Other chest pain: Secondary | ICD-10-CM | POA: Diagnosis not present

## 2018-12-10 DIAGNOSIS — D62 Acute posthemorrhagic anemia: Secondary | ICD-10-CM | POA: Diagnosis not present

## 2018-12-10 DIAGNOSIS — K9289 Other specified diseases of the digestive system: Secondary | ICD-10-CM | POA: Diagnosis not present

## 2018-12-10 DIAGNOSIS — M6281 Muscle weakness (generalized): Secondary | ICD-10-CM | POA: Diagnosis not present

## 2018-12-10 DIAGNOSIS — R279 Unspecified lack of coordination: Secondary | ICD-10-CM | POA: Diagnosis not present

## 2018-12-10 HISTORY — PX: ESOPHAGOGASTRODUODENOSCOPY: SHX5428

## 2018-12-10 LAB — CBC
HCT: 22.3 % — ABNORMAL LOW (ref 39.0–52.0)
Hemoglobin: 7.4 g/dL — ABNORMAL LOW (ref 13.0–17.0)
MCH: 25.9 pg — ABNORMAL LOW (ref 26.0–34.0)
MCHC: 33.2 g/dL (ref 30.0–36.0)
MCV: 78 fL — AB (ref 80.0–100.0)
PLATELETS: 177 10*3/uL (ref 150–400)
RBC: 2.86 MIL/uL — ABNORMAL LOW (ref 4.22–5.81)
RDW: 17.8 % — ABNORMAL HIGH (ref 11.5–15.5)
WBC: 6.6 10*3/uL (ref 4.0–10.5)
nRBC: 0 % (ref 0.0–0.2)

## 2018-12-10 LAB — COMPREHENSIVE METABOLIC PANEL
ALT: 12 U/L (ref 0–44)
AST: 15 U/L (ref 15–41)
Albumin: 2.6 g/dL — ABNORMAL LOW (ref 3.5–5.0)
Alkaline Phosphatase: 33 U/L — ABNORMAL LOW (ref 38–126)
Anion gap: 6 (ref 5–15)
BUN: 19 mg/dL (ref 8–23)
CO2: 23 mmol/L (ref 22–32)
Calcium: 7.7 mg/dL — ABNORMAL LOW (ref 8.9–10.3)
Chloride: 96 mmol/L — ABNORMAL LOW (ref 98–111)
Creatinine, Ser: 0.55 mg/dL — ABNORMAL LOW (ref 0.61–1.24)
GFR calc Af Amer: >60 mL/min (ref >60)
Glucose, Bld: 106 mg/dL — ABNORMAL HIGH (ref 70–99)
Potassium: 3.9 mmol/L (ref 3.5–5.1)
Sodium: 125 mmol/L — ABNORMAL LOW (ref 135–145)
Total Bilirubin: 1.2 mg/dL (ref 0.3–1.2)
Total Protein: 4.9 g/dL — ABNORMAL LOW (ref 6.5–8.1)

## 2018-12-10 LAB — PREPARE RBC (CROSSMATCH)

## 2018-12-10 LAB — HEMOGLOBIN: Hemoglobin: 6.6 g/dL — ABNORMAL LOW (ref 13.0–17.0)

## 2018-12-10 LAB — MRSA PCR SCREENING: MRSA by PCR: NEGATIVE

## 2018-12-10 SURGERY — EGD (ESOPHAGOGASTRODUODENOSCOPY)
Anesthesia: General

## 2018-12-10 SURGERY — EGD (ESOPHAGOGASTRODUODENOSCOPY)
Anesthesia: Monitor Anesthesia Care | Laterality: Left

## 2018-12-10 MED ORDER — SUCRALFATE 1 GM/10ML PO SUSP
1.0000 g | Freq: Four times a day (QID) | ORAL | Status: DC
  Administered 2018-12-10 (×2): 1 g via ORAL
  Filled 2018-12-10 (×4): qty 10

## 2018-12-10 MED ORDER — PROPOFOL 10 MG/ML IV BOLUS
INTRAVENOUS | Status: AC
  Filled 2018-12-10: qty 20

## 2018-12-10 MED ORDER — PROPOFOL 500 MG/50ML IV EMUL
INTRAVENOUS | Status: DC | PRN
  Administered 2018-12-10: 150 ug/kg/min via INTRAVENOUS

## 2018-12-10 MED ORDER — LIDOCAINE 2% (20 MG/ML) 5 ML SYRINGE
INTRAMUSCULAR | Status: DC | PRN
  Administered 2018-12-10: 100 mg via INTRAVENOUS

## 2018-12-10 MED ORDER — SODIUM CHLORIDE 0.9 % IV SOLN
INTRAVENOUS | Status: DC | PRN
  Administered 2018-12-10: 09:00:00 via INTRAVENOUS

## 2018-12-10 MED ORDER — PROPOFOL 10 MG/ML IV BOLUS
INTRAVENOUS | Status: DC | PRN
  Administered 2018-12-10: 50 mg via INTRAVENOUS

## 2018-12-10 MED ORDER — ONDANSETRON HCL 4 MG/2ML IJ SOLN
INTRAMUSCULAR | Status: AC
  Administered 2018-12-10: 4 mg via INTRAVENOUS
  Filled 2018-12-10: qty 2

## 2018-12-10 MED ORDER — SODIUM CHLORIDE 0.9% IV SOLUTION
Freq: Once | INTRAVENOUS | Status: AC
  Administered 2018-12-10: 18:00:00 via INTRAVENOUS

## 2018-12-10 MED ORDER — PROPOFOL 500 MG/50ML IV EMUL
INTRAVENOUS | Status: AC
  Filled 2018-12-10: qty 50

## 2018-12-10 MED ORDER — EPINEPHRINE PF 1 MG/10ML IJ SOSY
PREFILLED_SYRINGE | INTRAMUSCULAR | Status: AC
  Administered 2018-12-10: 4 mL
  Filled 2018-12-10: qty 20

## 2018-12-10 MED ORDER — SODIUM CHLORIDE (PF) 0.9 % IJ SOLN
PREFILLED_SYRINGE | INTRAMUSCULAR | Status: DC | PRN
  Administered 2018-12-10: 6 mL
  Administered 2018-12-10: 10 mL

## 2018-12-10 MED ORDER — SODIUM CHLORIDE 0.9 % IV SOLN
INTRAVENOUS | Status: DC
  Administered 2018-12-10: 13:00:00 via INTRAVENOUS

## 2018-12-10 MED ORDER — ONDANSETRON HCL 4 MG/2ML IJ SOLN: 4.0000 mg | Freq: Once | INTRAMUSCULAR | Status: DC

## 2018-12-10 NOTE — Anesthesia Postprocedure Evaluation (Signed)
Anesthesia Post Note  Patient: [name]  Procedure(s) Performed: ESOPHAGOGASTRODUODENOSCOPY (EGD) (N/A )  Patient location during evaluation: PACU Anesthesia Type: General Level of consciousness: awake and alert Pain management: pain level controlled Vital Signs Assessment: post-procedure vital signs reviewed and stable Respiratory status: spontaneous breathing, nonlabored ventilation, respiratory function stable and patient connected to nasal cannula oxygen Cardiovascular status: blood pressure returned to baseline and stable Postop Assessment: no apparent nausea or vomiting Anesthetic complications: no     Last Vitals:  Vitals:   12/10/18 1400 12/10/18 1500  BP: (!) 121/54   Pulse: 94 92  Resp: 17 17  Temp:  37.1 C  SpO2: 98% 99%    Last Pain:  Vitals:   12/10/18 1154  TempSrc:   PainSc: 0-No pain                 Molli Barrows

## 2018-12-10 NOTE — Progress Notes (Signed)
Repeat Hgb Lab results sent to Dr Darvin Neighbours, pt to stay NPO at this time [facility]. VSS , 650 mls urine output, skin is pale but warm and dry. Family at bedside and updated re plan

## 2018-12-10 NOTE — Anesthesia Preprocedure Evaluation (Signed)
Anesthesia Evaluation  Patient identified by MRN, date of birth, ID band Patient awake    Reviewed: Allergy & Precautions, H&P , NPO status , Patient's Chart, lab work & pertinent test results, reviewed documented beta blocker date and time   Airway Mallampati: II   Neck ROM: full    Dental  (+) Poor Dentition   Pulmonary sleep apnea ,    Pulmonary exam normal        Cardiovascular Exercise Tolerance: Poor hypertension, On Medications negative cardio ROS Normal cardiovascular exam Rhythm:regular Rate:Normal     Neuro/Psych  Neuromuscular disease negative psych ROS   GI/Hepatic Neg liver ROS, PUD, GERD  Medicated,  Endo/Other  negative endocrine ROS  Renal/GU negative Renal ROS  negative genitourinary   Musculoskeletal   Abdominal   Peds  Hematology  (+) Blood dyscrasia, anemia ,   Anesthesia Other Findings Past Medical History: No date: Blind right eye No date: Diaphragmatic hernia without mention of obstruction or  gangrene No date: Diverticulosis of colon (without mention of hemorrhage) No date: Dizziness and giddiness No date: Esophageal reflux 12/18/08: Gastritis     Comment:  EGD, 3 clips remaining (Dr. Vira Agar) No date: Hypertension No date: Iron deficiency anemia     Comment:  a.  Secondary to recurrent GI bleeding No date: Macular degeneration No date: Nonischemic cardiomyopathy ([place])     Comment:  a.  TTE and 3/15: EF 35 to 40%, mild left ear, mitral               valve repair with mild to moderate MR, mild to moderate               TR, PASP 48; b.  TTE 3/18: EF 25 to 30%, not technically               sufficient to allow for LV diastolic function, mild left               ear possibly underestimated secondary to degree of CM,               moderate AI calcified mitral annulus, moderate MR mod to               sev dilated LA mod dilated RA moderate TR PASP 71 No date: Persistent atrial  fibrillation     Comment:  a.  Status post multiple cardioversions; b.  Previously               on Coumadin though held secondary to recurrent GI bleed No date: Personal history of peptic ulcer disease No date: Pure hypercholesterolemia No date: Sprain of neck No date: Unspecified hemorrhoids without mention of complication No date: Unspecified sleep apnea 7/15-7/20/09: Upper GI bleed     Comment:  a.  Recurrent GI bleed dating back to at least 2009 with              subsequent bleed in 2010, multiple GI bleeds in 01/2018 Past Surgical History: No date: APPENDECTOMY 1996: CARDIAC CATHETERIZATION     Comment:  [facility] No date: CARDIAC CATHETERIZATION No date: CARDIAC CATHETERIZATION 12/17/2011: CARDIOVERSION     Comment:  Procedure: CARDIOVERSION;  Surgeon: Hillary Bow,               MD;  Location: [place];  Service: Cardiovascular;                Laterality: N/A; 10/13/08: CATARACT EXTRACTION     Comment:  left No  date: CHOLECYSTECTOMY 01/17/2018: ESOPHAGOGASTRODUODENOSCOPY; Left     Comment:  Procedure: ESOPHAGOGASTRODUODENOSCOPY (EGD);  Surgeon:               Virgel Manifold, MD;  Location: [facility] ENDOSCOPY;                Service: Endoscopy;  Laterality: Left; 12/07/2018: ESOPHAGOGASTRODUODENOSCOPY; N/A     Comment:  Procedure: ESOPHAGOGASTRODUODENOSCOPY (EGD);  Surgeon:               [place], Benay Pike, MD;  Location: [facility] ENDOSCOPY;                Service: Gastroenterology;  Laterality: N/A; 01/04/2018: ESOPHAGOGASTRODUODENOSCOPY (EGD) WITH PROPOFOL; N/A     Comment:  Procedure: ESOPHAGOGASTRODUODENOSCOPY (EGD) WITH               PROPOFOL;  Surgeon: [name], MD;  Location: [facility]               ENDOSCOPY;  Service: Endoscopy;  Laterality: N/A; 1996: MITRAL VALVE ANNULOPLASTY 1995: MITRAL VALVE REPAIR No date: PARTIAL GASTRECTOMY BMI    Body Mass Index:  24.20 kg/m     Reproductive/Obstetrics negative OB ROS                             Anesthesia  Physical Anesthesia Plan  ASA: IV  Anesthesia Plan: General   Post-op Pain Management:    Induction:   PONV Risk Score and Plan:   Airway Management Planned:   Additional Equipment:   Intra-op Plan:   Post-operative Plan:   Informed Consent: I have reviewed the patients History and Physical, chart, labs and discussed the procedure including the risks, benefits and alternatives for the proposed anesthesia with the patient or authorized representative who has indicated his/her understanding and acceptance.     Dental Advisory Given  Plan Discussed with: CRNA  Anesthesia Plan Comments:         Anesthesia Quick Evaluation

## 2018-12-10 NOTE — Progress Notes (Addendum)
[facility] [facility] [place] NAME: [name]    MR#:  [id number]  DATE OF BIRTH:  Aug 18, 1933  SUBJECTIVE:  CHIEF COMPLAINT:   Chief Complaint  Patient presents with  . Rectal Bleeding   EGD with significant amount of bleeding in stomach. Cauterized  REVIEW OF SYSTEMS:    Review of Systems  Constitutional: Positive for malaise/fatigue. Negative for chills and fever.  HENT: Negative for sore throat.   Eyes: Negative for blurred vision, double vision and pain.  Respiratory: Negative for cough, hemoptysis, shortness of breath and wheezing.   Cardiovascular: Negative for chest pain, palpitations, orthopnea and leg swelling.  Gastrointestinal: Positive for abdominal pain. Negative for constipation, diarrhea, heartburn, nausea and vomiting.  Genitourinary: Negative for dysuria and hematuria.  Musculoskeletal: Negative for back pain and joint pain.  Skin: Negative for rash.  Neurological: Positive for dizziness. Negative for sensory change, speech change, focal weakness and headaches.  Endo/Heme/Allergies: Does not bruise/bleed easily.  Psychiatric/Behavioral: Negative for depression. The patient is not nervous/anxious.     DRUG ALLERGIES:   Allergies  Allergen Reactions  . Atorvastatin Other (See Comments)    Other reaction(s): Unknown REACTION:muscular soreness  . Statins     Muscle aches on mult statins    VITALS:  Blood pressure 111/65, pulse (!) 104, temperature (!) 96.8 F (36 C), resp. rate (!) 23, height 5\' 11"  (1.803 m), weight 78.7 kg, SpO2 100 %.  PHYSICAL EXAMINATION:   Physical Exam  GENERAL:  85 y.o.-year-old patient lying in the bed with no acute distress.  EYES: Pupils equal, round, reactive to light and accommodation. No scleral icterus. Extraocular muscles intact.  HEENT: Head atraumatic, normocephalic. Oropharynx and nasopharynx clear.  NECK:  Supple, no jugular venous distention. No thyroid enlargement, no  tenderness.  LUNGS: Normal breath sounds bilaterally, no wheezing, rales, rhonchi. No use of accessory muscles of respiration.  CARDIOVASCULAR: S1, S2 normal. No murmurs, rubs, or gallops.  ABDOMEN: Soft, epigastric tenderness, nondistended. Bowel sounds present. No organomegaly or mass.  EXTREMITIES: No cyanosis, clubbing or edema b/l.    NEUROLOGIC: Cranial nerves II through XII are intact. No focal Motor or sensory deficits b/l.   PSYCHIATRIC: The patient is alert and oriented x 3.  SKIN: No obvious rash, lesion, or ulcer.   LABORATORY PANEL:   CBC Recent Labs  Lab 12/10/18 0806  WBC 6.6  HGB 7.4*  HCT 22.3*  PLT 177   ------------------------------------------------------------------------------------------------------------------ Chemistries  Recent Labs  Lab 12/10/18 0806  NA 125*  K 3.9  CL 96*  CO2 23  GLUCOSE 106*  BUN 19  CREATININE 0.55*  CALCIUM 7.7*  AST 15  ALT 12  ALKPHOS 33*  BILITOT 1.2   ------------------------------------------------------------------------------------------------------------------  Cardiac Enzymes No results for input(s): TROPONINI in the last 168 hours. ------------------------------------------------------------------------------------------------------------------  RADIOLOGY:  No results found.   ASSESSMENT AND PLAN:   *Recurrent upper GI bleed with acute blood loss anemia.  Status post unit blood transfusion. EGD today with cauterization of AVM.  Very friable mucosa.  Discussed with Dr. Bonna Gains. Due to high risk for bleeding and recurrent GI bleed recommendation is for gastrectomy.  Discussed with Dr. Celine Ahr of general surgery.  Recommending patient be transferred to tertiary care center due to history of [place] surgery. I have called [facility] and waiting to hear back. Critically ill.  Will be transferred to ICU.  Discussed with Dr. Patsey Berthold.  *Hypertension.  Medications are on hold due to GI bleed and low normal  blood  pressure.  *Paroxysmal atrial fibrillation.  Not on anticoagulants due to recurrent GI bleed.  DVT prophylaxis with SCDs  All the records are reviewed and case discussed with Care Management/Social Worker Management plans discussed with the patient, family and they are in agreement.  CODE STATUS: FULL CODE  TOTAL CRITICAL CARE TIME TAKING CARE OF THIS PATIENT:  40 minutes.   Addendum  Additional critical care time spent discussing with GI/Surgery/ICU and transfer centers at [facility], [facility] with their consultants  50 minutes  Neita Carp M.D on 12/10/2018 at 12:54 PM  Between 7am to 6pm - Pager - [phone]  After 6pm go to [email or web address] - password [email or web address] [place] [facility]  Office  [phone]  CC: Primary care physician; Tonia Ghent, MD  Note: This dictation was prepared with Dragon dictation along with smaller phrase technology. Any transcriptional errors that result from this process are unintentional.

## 2018-12-10 NOTE — Discharge Summary (Signed)
Physician Discharge Summary  Patient ID: [name] MRN: [id number] DOB/AGE: 1933-02-06 85 y.o.  Admit date: 12/09/2018 Discharge date: 12/10/2018  Admission Diagnoses:  Discharge Diagnoses:  Principal Problem:   GI bleed Active Problems:   HYPERCHOLESTEROLEMIA   GERD   Hypertension   Persistent atrial fibrillation   Acute blood loss anemia   Discharged Condition: stable  Hospital Course: 85 y/o male with a hx of Gastric ulcer s/p EGD with cautery hemostasis by Dr. Bonna Gains in April 2019 who presented to the ED on 12/09/2018 with massive rectal bleeding x 3 days. He reported rectal bleeding x 3 weeks but indicated the last three days had been massive. His Hg was 9.6 g/dl in the [facility] underwent an EGD and was found to have Multiple bleeding angioectasias in the stomach. Treatment was unsuccessful. His hemoglobin progressively dropped to 6.6g/dl. He was given 3 units of PRBCs. He was taken for another EGD today and  Clotted blood was found in the stomach and frail mucosa with spontaneous bleeding was found. Patent Billroth II gastrojejunostomy was found,  characterized by erythema, friable mucosa and ulceration. Injected. Treated with argon plasma. The decision was made to transfer patient to a higher level of care due to recurrent bleeding.  Consults: Gastroenterology  Significant Diagnostic Studies: EGD  12/10/2018   Evidence of a patent Billroth II gastrojejunostomy was found. The gastrojejunal anastomosis was characterized by erythema, friable mucosa and ulceration. This was traversed. Area was successfully injected with of a 1:10,000 solution of epinephrine for hemostasis. Coagulation for hemostasis using argon plasma was successful.   12/07/2018  Normal esophagus. 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 Treatment not successful. Treatment not successful. Treated with bipolar cautery. Treated with argon plasma coagulation (APC). - Patent Billroth II  gastrojejunostomy was found. - Normal examined jejunum. - No specimens collected.  Review Of Systems:   Constitutional: Negative for fever and chills.  HENT: Negative for congestion and rhinorrhea.  Eyes: Negative for redness and visual disturbance.  Respiratory: Negative for shortness of breath and wheezing.  Cardiovascular: Negative for chest pain and palpitations.  Gastrointestinal: Negative  for nausea , vomiting and abdominal pain and  Loose stools Genitourinary: Negative for dysuria and urgency.  Endocrine: Denies polyuria, polyphagia and heat intolerance Musculoskeletal: Negative for myalgias and arthralgias.  Skin: Negative for pallor and wound.  Neurological: Negative for dizziness and headaches   INTAKE / OUTPUT: I/O last 3 completed shifts: In: 393.8 [I.V.:295.4; IV Piggyback:98.4] Out: 1050 [Urine:1050]  LABS:  BMET Recent Labs  Lab 12/08/18 0438 12/09/18 1924 12/10/18 0806  NA 127* 129* 125*  K 3.9 3.5 3.9  CL 101 102 96*  CO2 23 20* 23  BUN 10 19 19   CREATININE 0.58* 0.60* 0.55*  GLUCOSE 95 112* 106*    Electrolytes Recent Labs  Lab 12/08/18 0438 12/09/18 1924 12/10/18 0806  CALCIUM 7.7* 6.4* 7.7*    CBC Recent Labs  Lab 12/08/18 0438 12/09/18 1924 12/10/18 0806 12/10/18 1426  WBC 4.5 7.6 6.6  --   HGB 9.0* 5.5* 7.4* 6.6*  HCT 28.9* 17.4* 22.3*  --   PLT 184 179 177  --     Coag's Recent Labs  Lab 12/06/18 1618  APTT 30  INR 1.0    Sepsis Markers No results for input(s): LATICACIDVEN, PROCALCITON, O2SATVEN in the last 168 hours.  ABG No results for input(s): PHART, PCO2ART, PO2ART in the last 168 hours.  Liver Enzymes Recent Labs  Lab 12/06/18 1152 12/10/18 0806  AST 23  15  ALT 13 12  ALKPHOS 57 33*  BILITOT 0.9 1.2  ALBUMIN 3.7 2.6*     Treatments: IV hydration and blood transfusion. On a PPI infusion  Discharge Exam: Blood pressure (!) 109/54, pulse (!) 103, temperature 98.4 F (36.9 C), temperature source  Oral, resp. rate 17, height 5\' 11"  (1.803 m), weight 78.7 kg, SpO2 100 %.  General: Lying in bed. In NAD HEENT:  PERRLA, trachea midline, no JVD Neuro:  AAO X 3,no focal deficits Cardiovascular: AP tachycardiac at 104, regular, +2 pules, no edema Lungs:  CTAB Abdomen:  ND/NT, +BS X 4 Musculoskeletal:  +rom, no deformities Skin: warm and dry  Disposition: Discharge disposition: [place] Not Defined     Transfer to MICU at [place] as of 12/10/2018      Reactions   Atorvastatin Other (See Comments)   Other reaction(s): Unknown REACTION:muscular soreness   Statins    Muscle aches on mult statins      Medication List    STOP taking these medications   amiodarone 200 MG tablet Commonly known as:  PACERONE   cholecalciferol 1000 units tablet Commonly known as:  VITAMIN D   cyanocobalamin 100 MCG tablet   eplerenone 25 MG tablet Commonly known as:  INSPRA   ferrous sulfate 325 (65 FE) MG tablet   hydroxypropyl methylcellulose / hypromellose 2.5 % ophthalmic solution Commonly known as:  ISOPTO TEARS / GONIOVISC   lisinopril 2.5 MG tablet Commonly known as:  PRINIVIL,ZESTRIL   metoprolol succinate 25 MG 24 hr tablet Commonly known as:  Toprol XL   ondansetron 4 MG tablet Commonly known as:  ZOFRAN   pantoprazole 40 MG tablet Commonly known as:  Protonix   tamsulosin 0.4 MG Caps capsule Commonly known as:  FLOMAX        Signed: Magddalene Bobbye Riggs  Pulmonary and Critical Care Medicine [facility] Pager [phone] or [phone]  NB: This document was prepared using Dragon voice recognition software and may include unintentional dictation errors.   12/10/2018, 8:00 PM

## 2018-12-10 NOTE — Consult Note (Signed)
Vonda Antigua, MD [address] [place], [place], [place], [address] [address], [place], [place], [place], [address] Phone: [phone]  Fax: [phone]  Consultation  Referring Provider:     Dr. Jannifer Franklin Primary Care Physician:  Tonia Ghent, MD Reason for Consultation:     GI bleed  Date of Admission:  12/09/2018 Date of Consultation:  12/10/2018         HPI:   [name] is a 85 y.o. 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  Reports episode started 1 day ago, has not occurred today, 2-3 episodes of hematemesis and one episode of melena.  Denies any abdominal pain.  Denies NSAID use. No anticoagulants.  Patient has had 3 episodes of admissions with bleeding within the last year due to bleeding from gastric AVMs or from the cauterization site itself due to friable gastric mucosa.  History of Billroth II 30 to 40 years ago due to peptic ulcer disease at the time.  Past Medical History:  Diagnosis Date  . Blind right eye   . Diaphragmatic hernia without mention of obstruction or gangrene   . Diverticulosis of colon (without mention of hemorrhage)   . Dizziness and giddiness   . Esophageal reflux   . Gastritis 12/18/08   EGD, 3 clips remaining (Dr. Vira Agar)  . Hypertension   . Iron deficiency anemia    a.  Secondary to recurrent GI bleeding  . Macular degeneration   . Nonischemic cardiomyopathy (HCC)    a.  TTE and 3/15: EF 35 to 40%, mild left ear, mitral valve repair with mild to moderate MR, mild to moderate TR, PASP 48; b.  TTE 3/18: EF 25 to 30%, not technically sufficient to allow for LV diastolic function, mild left ear possibly underestimated secondary to degree of CM, moderate AI calcified mitral annulus, moderate MR mod to sev dilated LA mod dilated RA moderate TR PASP 71  . Persistent atrial fibrillation    a.  Status post multiple cardioversions; b.  Previously on Coumadin  though held secondary to recurrent GI bleed  . Personal history of peptic ulcer disease   . Pure hypercholesterolemia   . Sprain of neck   . Unspecified hemorrhoids without mention of complication   . Unspecified sleep apnea   . Upper GI bleed 7/15-7/20/09   a.  Recurrent GI bleed dating back to at least 2009 with subsequent bleed in 2010, multiple GI bleeds in 01/2018    Past Surgical History:  Procedure Laterality Date  . APPENDECTOMY    . CARDIAC CATHETERIZATION  1996   [facility]  . CARDIAC CATHETERIZATION    . CARDIAC CATHETERIZATION    . CARDIOVERSION  12/17/2011   Procedure: CARDIOVERSION;  Surgeon: Hillary Bow, MD;  Location: [place];  Service: Cardiovascular;  Laterality: N/A;  . CATARACT EXTRACTION  10/13/08   left  . CHOLECYSTECTOMY    . ESOPHAGOGASTRODUODENOSCOPY Left 01/17/2018   Procedure: ESOPHAGOGASTRODUODENOSCOPY (EGD);  Surgeon: Virgel Manifold, MD;  Location: [facility] ENDOSCOPY;  Service: Endoscopy;  Laterality: Left;  . ESOPHAGOGASTRODUODENOSCOPY N/A 12/07/2018   Procedure: ESOPHAGOGASTRODUODENOSCOPY (EGD);  Surgeon: Toledo, Benay Pike, MD;  Location: [facility] ENDOSCOPY;  Service: Gastroenterology;  Laterality: N/A;  . ESOPHAGOGASTRODUODENOSCOPY (EGD) WITH PROPOFOL N/A 01/04/2018   Procedure: ESOPHAGOGASTRODUODENOSCOPY (EGD) WITH PROPOFOL;  Surgeon: [name], MD;  Location: [facility] ENDOSCOPY;  Service: Endoscopy;  Laterality: N/A;  . MITRAL VALVE ANNULOPLASTY  1996  .  MITRAL VALVE REPAIR  1995  . PARTIAL GASTRECTOMY      Prior to Admission medications   Medication Sig Start Date End Date Taking? Authorizing Provider  amiodarone (PACERONE) 200 MG tablet TAKE 1 TABLET BY MOUTH ONCE DAILY Patient taking differently: Take 200 mg by mouth daily.  11/08/17   Wellington Hampshire, MD  cholecalciferol (VITAMIN D) 1000 UNITS tablet Take 1,000 Units by mouth daily.    [provider]  cyanocobalamin 100 MCG tablet Take 100 mcg by mouth daily.      [provider]    eplerenone (INSPRA) 25 MG tablet Take 25 mg by mouth daily. 03/19/18   [provider]  ferrous sulfate 325 (65 FE) MG tablet Take 325 mg by mouth 2 (two) times daily.    [provider]  hydroxypropyl methylcellulose (ISOPTO TEARS) 2.5 % ophthalmic solution Place 1 drop into both eyes every morning.    [provider]  lisinopril (PRINIVIL,ZESTRIL) 2.5 MG tablet Take 1 tablet (2.5 mg total) by mouth daily. 10/23/18 01/21/19  Rise Mu, PA-C  metoprolol succinate (TOPROL XL) 25 MG 24 hr tablet Take 1 tablet (25 mg total) by mouth daily. 07/18/18   Wellington Hampshire, MD  ondansetron (ZOFRAN) 4 MG tablet Take 1 tablet (4 mg total) by mouth every 6 (six) hours as needed for up to 20 doses for nausea. 12/08/18   Lule, Sara Chu, PA  pantoprazole (PROTONIX) 40 MG tablet Take 1 tablet (40 mg total) by mouth 2 (two) times daily for 30 days. 12/08/18 01/07/19  Ripley Fraise, PA  tamsulosin (FLOMAX) 0.4 MG CAPS capsule TAKE 1 CAPSULE BY MOUTH ONCE DAILY Patient taking differently: Take 0.4 mg by mouth daily.  10/30/18   Tonia Ghent, MD    Family History  Problem Relation Age of Onset  . Cancer Father        lung  . Colon cancer Neg Hx   . Prostate cancer Neg Hx      Social History   Tobacco Use  . Smoking status: Never Smoker  . Smokeless tobacco: Never Used  Substance Use Topics  . Alcohol use: Yes    Comment: socially, beer   . Drug use: No    Allergies as of 12/09/2018 - Review Complete 12/09/2018  Allergen Reaction Noted  . Atorvastatin Other (See Comments) 11/03/2006  . Statins  02/15/2014    Review of Systems:    All systems reviewed and negative except where noted in HPI.   Physical Exam:  Vital signs in last 24 hours: Vitals:   12/10/18 0320 12/10/18 0337 12/10/18 0424 12/10/18 0652  BP: (!) 118/58 118/61 128/63 122/63  Pulse: 95 95 97 95  Resp: 20 16 18 18   Temp: 99.5 F (37.5 C) 99.7 F (37.6 C) 99.8 F (37.7 C) 99 F (37.2 C)  TempSrc: Oral  Oral Oral Oral  SpO2:  99% 100% 99%  Weight:      Height:       Last BM Date: 12/09/18 General:   Pleasant, cooperative in NAD Head:  Normocephalic and atraumatic. Eyes:   No icterus.   Conjunctiva pink. PERRLA. Ears:  Normal auditory acuity. Neck:  Supple; no masses or thyroidomegaly Lungs: Respirations even and unlabored. Lungs clear to auscultation bilaterally.   No wheezes, crackles, or rhonchi.  Abdomen:  Soft, nondistended, nontender. Normal bowel sounds. No appreciable masses or hepatomegaly.  No rebound or guarding.  Neurologic:  Alert and oriented x3;  grossly normal neurologically. Skin:  Intact without significant lesions or rashes. Cervical Nodes:  No significant cervical adenopathy. Psych:  Alert and cooperative. Normal affect.  LAB RESULTS: Recent Labs    12/08/18 0438 12/09/18 1924 12/10/18 0806  WBC 4.5 7.6 6.6  HGB 9.0* 5.5* 7.4*  HCT 28.9* 17.4* 22.3*  PLT 184 179 177   BMET Recent Labs    12/08/18 0438 12/09/18 1924 12/10/18 0806  NA 127* 129* 125*  K 3.9 3.5 3.9  CL 101 102 96*  CO2 23 20* 23  GLUCOSE 95 112* 106*  BUN 10 19 19   CREATININE 0.58* 0.60* 0.55*  CALCIUM 7.7* 6.4* 7.7*   LFT Recent Labs    12/10/18 0806  PROT 4.9*  ALBUMIN 2.6*  AST 15  ALT 12  ALKPHOS 33*  BILITOT 1.2   PT/INR No results for input(s): LABPROT, INR in the last 72 hours.  STUDIES: No results found.    Impression / Plan:   [name] is a 85 y.o. y/o male with history of Billroth II, presents with repeat GI bleeding with recent discharge for the same  Patient will need repeat EGD for further evaluation Likely source of bleeding is the recently treated AVM sites PPI IV twice daily  Continue serial CBCs and transfuse PRN Avoid NSAIDs Maintain 2 large-bore IV lines Please page GI with any acute hemodynamic changes, or signs of active GI bleeding  Primary team to please consider nephrology consult due to hyponatremia  Please see EGD report  after procedure for findings details and recommendations.  Thank you for involving me in the care of this patient.      LOS: 1 day   Virgel Manifold, MD  12/10/2018, 9:29 AM

## 2018-12-10 NOTE — Anesthesia Post-op Follow-up Note (Signed)
Anesthesia QCDR form completed.        

## 2018-12-10 NOTE — Progress Notes (Signed)
Family aware of lack of beds at [facility], and need to stay at [facility], blood infusing, family at bedside. VSS , skin pale but dry , HR 100 BP 106/.47

## 2018-12-10 NOTE — Progress Notes (Signed)
Patient received from Endo, VSS, 3 Ivs in place, Used urinal , denies pain , abdo soft and mlldy tender, will continue to observe

## 2018-12-10 NOTE — OR Nursing (Signed)
Transferred to [place] [facility] via bed. Report given to Cyra  RN at bed side.

## 2018-12-10 NOTE — Progress Notes (Signed)
Accepted at [facility] by Dr. Liane Comber in MICU. Accepted at [facility] MICU by Dr. Tobie Poet. [facility] refused transfer as unable to do gastrectomy.  [facility] and [facility] unlikely to have ICU beds atleast till tomorrow.

## 2018-12-10 NOTE — Op Note (Signed)
[facility] Gastroenterology Patient Name: [name] Procedure Date: 12/10/2018 9:28 AM MRN: [id number] Account #: [id number] Date of Birth: 02-18-1933 Admit Type: Inpatient Age: 85 Room: [facility] ENDO ROOM 4 Gender: Male Note Status: Finalized Procedure:            Upper GI endoscopy Indications:          Hematemesis, Melena Providers:            Teri Diltz B. Bonna Gains MD, MD Referring MD:         Elveria Rising. Damita Dunnings, MD (Referring MD) Medicines:            Monitored Anesthesia Care Complications:        No immediate complications. Procedure:            Pre-Anesthesia Assessment:                       - The risks and benefits of the procedure and the                        sedation options and risks were discussed with the                        patient. All questions were answered and informed                        consent was obtained.                       - Patient identification and proposed procedure were                        verified prior to the procedure.                       - ASA Grade Assessment: III - A patient with severe                        systemic disease.                       After obtaining informed consent, the endoscope was                        passed under direct vision. Throughout the procedure,                        the patient's blood pressure, pulse, and oxygen                        saturations were monitored continuously. The Endoscope                        was introduced through the mouth, and advanced to the                        jejunum. The upper GI endoscopy was accomplished with                        ease. The patient tolerated the procedure well. Findings:      The examined esophagus was normal.      Clotted blood  was found in the stomach. The scope was withdrawn and       replaced with the therapeutic endoscope because of bleeding, in order to       enhance visibility and in order to treat bleeding.      Patchy  moderately friable mucosa with spontaneous bleeding was found in       the entire examined stomach. Area was successfully injected with of a       1:10,000 solution of epinephrine for hemostasis. Coagulation for       hemostasis using argon plasma was successful.      Evidence of a patent Billroth II gastrojejunostomy was found. The       gastrojejunal anastomosis was characterized by erythema, friable mucosa       and ulceration. This was traversed. Area was successfully injected with       of a 1:10,000 solution of epinephrine for hemostasis. Coagulation for       hemostasis using argon plasma was successful.      Hematin (altered blood/coffee-ground-like material) was found in the       jejunum. Impression:           - Normal esophagus.                       - Clotted blood in the stomach.                       - Friable gastric mucosa. Injected. Treated with argon                        plasma coagulation (APC).                       - Patent Billroth II gastrojejunostomy was found,                        characterized by erythema, friable mucosa and                        ulceration. Injected. Treated with argon plasma                        coagulation (APC).                       - Jejunal blood.                       - No specimens collected.                       - Over 1 Liter of blood was suctioned from the stomach.                        One specific visible vessel or one specific source of                        bleeding could not be found. Likely source of bleeding                        is friable gastric mucosa that is bleeding and was  treated today. Recommendation:       - Pt is at high risk of rebleeding and should be                        monitored in an ICU setting.                       -Dr. Celine Ahr from surgery was made aware about the                        patient and need for surgery if he rebleeds as he has                        been  through 3 EGDs within the last year for GI Bleed                        and further endoscopy is not going to lead to                        additional therapeutic benefit due to his friable                        gastric mucosa                       - Continue Serial CBCs and transfuse PRN                       - Use Protonix (pantoprazole) 40 mg IV BID.                       - Use sucralfate suspension 1 gram PO QID.                       - NPO for 4 hours.                       - Advance to clear liquid if no further active bleeding                        after 4 hrs                       - The findings and recommendations were discussed with                        the patient.                       - The findings and recommendations were discussed with                        the patient's primary physician (Dr. Darvin Neighbours). Procedure Code(s):    --- Professional ---                       [phone], Esophagogastroduodenoscopy, flexible, transoral;                        with control of bleeding, any method Diagnosis Code(s):    --- Professional ---  K92.2, Gastrointestinal hemorrhage, unspecified                       K31.89, Other diseases of stomach and duodenum                       Z98.0, Intestinal bypass and anastomosis status                       K92.0, Hematemesis                       K92.1, Melena (includes Hematochezia) CPT copyright 2018 American Medical Association. All rights reserved. The codes documented in this report are preliminary and upon coder review may  be revised to meet current compliance requirements.  Vonda Antigua, MD Margretta Sidle B. Bonna Gains MD, MD 12/10/2018 11:00:43 AM This report has been signed electronically. Number of Addenda: 0 Note Initiated On: 12/10/2018 9:28 AM Estimated Blood Loss: Estimated blood loss: none.      [facility]

## 2018-12-10 NOTE — Progress Notes (Signed)
[facility] called with a bed, Blood infusing , Protonix infusing,. Report called to Darral Dash- Baldo Ash RN at [facility], going to room 4322.

## 2018-12-10 NOTE — Progress Notes (Signed)
Cussed with Dr. Reola Calkins with surgery department and Dr. Liane Comber the MICU attending at [facility].  Agreed for transfer but no beds available.  Advised calling other facilities.  I have contacted [facility] and waiting to hear back.

## 2018-12-10 NOTE — Transfer of Care (Signed)
Immediate Anesthesia Transfer of Care Note  Patient: [name]  Procedure(s) Performed: ESOPHAGOGASTRODUODENOSCOPY (EGD) (N/A )  Patient Location: Endoscopy Unit  Anesthesia Type:General  Level of Consciousness: sedated  Airway & Oxygen Therapy: Patient connected to nasal cannula oxygen  Post-op Assessment: Post -op Vital signs reviewed and stable  Post vital signs: stable  Last Vitals:  Vitals Value Taken Time  BP 112/48 12/10/2018 10:35 AM  Temp    Pulse 96 12/10/2018 10:35 AM  Resp    SpO2 100 % 12/10/2018 10:35 AM    Last Pain:  Vitals:   12/10/18 0730  TempSrc:   PainSc: Asleep         Complications: No apparent anesthesia complications

## 2018-12-11 ENCOUNTER — Encounter: Payer: Self-pay | Admitting: Gastroenterology

## 2018-12-11 ENCOUNTER — Telehealth: Payer: Self-pay

## 2018-12-11 LAB — BPAM RBC
Blood Product Expiration Date: 202003242359
Blood Product Expiration Date: 202003282359
Blood Product Expiration Date: 202003292359
ISSUE DATE / TIME: 202003072113
ISSUE DATE / TIME: 202003080305
ISSUE DATE / TIME: 202003081826
Unit Type and Rh: 5100
Unit Type and Rh: 5100
Unit Type and Rh: 5100

## 2018-12-11 LAB — TYPE AND SCREEN
ABO/RH(D): O POS
ANTIBODY SCREEN: NEGATIVE
Unit division: 0
Unit division: 0
Unit division: 0

## 2018-12-11 LAB — GLUCOSE, CAPILLARY: Glucose-Capillary: 130 mg/dL — ABNORMAL HIGH (ref 70–99)

## 2018-12-11 NOTE — Telephone Encounter (Signed)
Attempted to reach patient to complete TCM and confirm hospital f/u.

## 2018-12-12 NOTE — Telephone Encounter (Signed)
Unable to reach patient at time of TCM Call. Left message for patient to return call when available.  

## 2018-12-13 NOTE — Telephone Encounter (Signed)
Called patient and left a message. Not able to do TCM call at this point. Patient cancelled appointment with Dr. Damita Dunnings on 12/14/2018. Looking at D/C notes, looks like patient was transferred to short term hospital stay this may be the reason for cancellation.

## 2018-12-14 ENCOUNTER — Ambulatory Visit: Payer: Medicare Other | Admitting: Family Medicine

## 2018-12-18 DIAGNOSIS — I4819 Other persistent atrial fibrillation: Secondary | ICD-10-CM | POA: Diagnosis not present

## 2018-12-18 DIAGNOSIS — E119 Type 2 diabetes mellitus without complications: Secondary | ICD-10-CM | POA: Diagnosis not present

## 2018-12-18 DIAGNOSIS — I428 Other cardiomyopathies: Secondary | ICD-10-CM | POA: Diagnosis not present

## 2018-12-18 DIAGNOSIS — H5461 Unqualified visual loss, right eye, normal vision left eye: Secondary | ICD-10-CM | POA: Diagnosis not present

## 2018-12-18 DIAGNOSIS — J9601 Acute respiratory failure with hypoxia: Secondary | ICD-10-CM | POA: Diagnosis not present

## 2018-12-18 DIAGNOSIS — Z888 Allergy status to other drugs, medicaments and biological substances status: Secondary | ICD-10-CM | POA: Diagnosis not present

## 2018-12-18 DIAGNOSIS — R531 Weakness: Secondary | ICD-10-CM | POA: Diagnosis present

## 2018-12-18 DIAGNOSIS — R339 Retention of urine, unspecified: Secondary | ICD-10-CM | POA: Diagnosis not present

## 2018-12-18 DIAGNOSIS — Z4682 Encounter for fitting and adjustment of non-vascular catheter: Secondary | ICD-10-CM | POA: Diagnosis not present

## 2018-12-18 DIAGNOSIS — Z79899 Other long term (current) drug therapy: Secondary | ICD-10-CM | POA: Diagnosis not present

## 2018-12-18 DIAGNOSIS — I059 Rheumatic mitral valve disease, unspecified: Secondary | ICD-10-CM | POA: Diagnosis not present

## 2018-12-18 DIAGNOSIS — R9431 Abnormal electrocardiogram [ECG] [EKG]: Secondary | ICD-10-CM | POA: Diagnosis not present

## 2018-12-18 DIAGNOSIS — Z8711 Personal history of peptic ulcer disease: Secondary | ICD-10-CM | POA: Diagnosis not present

## 2018-12-18 DIAGNOSIS — R001 Bradycardia, unspecified: Secondary | ICD-10-CM | POA: Diagnosis not present

## 2018-12-18 DIAGNOSIS — H04123 Dry eye syndrome of bilateral lacrimal glands: Secondary | ICD-10-CM | POA: Diagnosis not present

## 2018-12-18 DIAGNOSIS — R188 Other ascites: Secondary | ICD-10-CM | POA: Diagnosis not present

## 2018-12-18 DIAGNOSIS — I959 Hypotension, unspecified: Secondary | ICD-10-CM | POA: Diagnosis not present

## 2018-12-18 DIAGNOSIS — J90 Pleural effusion, not elsewhere classified: Secondary | ICD-10-CM | POA: Diagnosis not present

## 2018-12-18 DIAGNOSIS — I517 Cardiomegaly: Secondary | ICD-10-CM | POA: Diagnosis not present

## 2018-12-18 DIAGNOSIS — R5381 Other malaise: Secondary | ICD-10-CM | POA: Diagnosis not present

## 2018-12-18 DIAGNOSIS — R52 Pain, unspecified: Secondary | ICD-10-CM | POA: Diagnosis not present

## 2018-12-18 DIAGNOSIS — R279 Unspecified lack of coordination: Secondary | ICD-10-CM | POA: Diagnosis not present

## 2018-12-18 DIAGNOSIS — D649 Anemia, unspecified: Secondary | ICD-10-CM | POA: Diagnosis not present

## 2018-12-18 DIAGNOSIS — I10 Essential (primary) hypertension: Secondary | ICD-10-CM | POA: Diagnosis not present

## 2018-12-18 DIAGNOSIS — D62 Acute posthemorrhagic anemia: Secondary | ICD-10-CM | POA: Diagnosis not present

## 2018-12-18 DIAGNOSIS — G4730 Sleep apnea, unspecified: Secondary | ICD-10-CM | POA: Diagnosis not present

## 2018-12-18 DIAGNOSIS — E875 Hyperkalemia: Secondary | ICD-10-CM | POA: Diagnosis not present

## 2018-12-18 DIAGNOSIS — J9811 Atelectasis: Secondary | ICD-10-CM | POA: Diagnosis not present

## 2018-12-18 DIAGNOSIS — I110 Hypertensive heart disease with heart failure: Secondary | ICD-10-CM | POA: Diagnosis not present

## 2018-12-18 DIAGNOSIS — Z515 Encounter for palliative care: Secondary | ICD-10-CM | POA: Diagnosis not present

## 2018-12-18 DIAGNOSIS — E871 Hypo-osmolality and hyponatremia: Secondary | ICD-10-CM | POA: Diagnosis not present

## 2018-12-18 DIAGNOSIS — I429 Cardiomyopathy, unspecified: Secondary | ICD-10-CM | POA: Diagnosis not present

## 2018-12-18 DIAGNOSIS — R4020 Unspecified coma: Secondary | ICD-10-CM | POA: Diagnosis not present

## 2018-12-18 DIAGNOSIS — Z8679 Personal history of other diseases of the circulatory system: Secondary | ICD-10-CM | POA: Diagnosis not present

## 2018-12-18 DIAGNOSIS — K922 Gastrointestinal hemorrhage, unspecified: Secondary | ICD-10-CM | POA: Diagnosis not present

## 2018-12-18 DIAGNOSIS — I5023 Acute on chronic systolic (congestive) heart failure: Secondary | ICD-10-CM | POA: Diagnosis not present

## 2018-12-18 DIAGNOSIS — R464 Slowness and poor responsiveness: Secondary | ICD-10-CM | POA: Diagnosis not present

## 2018-12-18 DIAGNOSIS — R578 Other shock: Secondary | ICD-10-CM | POA: Diagnosis not present

## 2018-12-18 DIAGNOSIS — K219 Gastro-esophageal reflux disease without esophagitis: Secondary | ICD-10-CM | POA: Diagnosis not present

## 2018-12-18 DIAGNOSIS — D509 Iron deficiency anemia, unspecified: Secondary | ICD-10-CM | POA: Diagnosis not present

## 2018-12-18 DIAGNOSIS — I509 Heart failure, unspecified: Secondary | ICD-10-CM | POA: Diagnosis not present

## 2018-12-18 DIAGNOSIS — I2510 Atherosclerotic heart disease of native coronary artery without angina pectoris: Secondary | ICD-10-CM | POA: Diagnosis not present

## 2018-12-18 DIAGNOSIS — Z9049 Acquired absence of other specified parts of digestive tract: Secondary | ICD-10-CM | POA: Diagnosis not present

## 2018-12-18 DIAGNOSIS — M6281 Muscle weakness (generalized): Secondary | ICD-10-CM | POA: Diagnosis not present

## 2018-12-18 DIAGNOSIS — N170 Acute kidney failure with tubular necrosis: Secondary | ICD-10-CM | POA: Diagnosis not present

## 2018-12-18 DIAGNOSIS — E785 Hyperlipidemia, unspecified: Secondary | ICD-10-CM | POA: Diagnosis not present

## 2018-12-18 DIAGNOSIS — K9289 Other specified diseases of the digestive system: Secondary | ICD-10-CM | POA: Diagnosis not present

## 2018-12-18 DIAGNOSIS — I9589 Other hypotension: Secondary | ICD-10-CM | POA: Diagnosis not present

## 2018-12-18 MED ORDER — SODIUM CHLORIDE 0.9 % IV SOLN
INTRAVENOUS | Status: DC
Start: ? — End: 2018-12-18

## 2018-12-18 MED ORDER — FINASTERIDE 5 MG PO TABS
5.00 | ORAL_TABLET | ORAL | Status: DC
Start: 2018-12-19 — End: 2018-12-18

## 2018-12-18 MED ORDER — METOPROLOL TARTRATE 25 MG PO TABS
25.00 | ORAL_TABLET | ORAL | Status: DC
Start: 2018-12-18 — End: 2018-12-18

## 2018-12-18 MED ORDER — SIMETHICONE 80 MG PO CHEW
80.00 | CHEWABLE_TABLET | ORAL | Status: DC
Start: ? — End: 2018-12-18

## 2018-12-18 MED ORDER — PANTOPRAZOLE SODIUM 40 MG PO TBEC
40.00 | DELAYED_RELEASE_TABLET | ORAL | Status: DC
Start: 2018-12-18 — End: 2018-12-18

## 2018-12-18 MED ORDER — ACETAMINOPHEN 325 MG PO TABS
650.00 | ORAL_TABLET | ORAL | Status: DC
Start: ? — End: 2018-12-18

## 2018-12-18 MED ORDER — MELATONIN 3 MG PO TABS
3.00 | ORAL_TABLET | ORAL | Status: DC
Start: 2018-12-18 — End: 2018-12-18

## 2018-12-18 MED ORDER — ALUM & MAG HYDROXIDE-SIMETH 400-400-40 MG/5ML PO SUSP
30.00 | ORAL | Status: DC
Start: ? — End: 2018-12-18

## 2018-12-18 MED ORDER — SUCRALFATE 1 G PO TABS
1.00 | ORAL_TABLET | ORAL | Status: DC
Start: 2018-12-18 — End: 2018-12-18

## 2018-12-18 MED ORDER — TAMSULOSIN HCL 0.4 MG PO CAPS
.40 | ORAL_CAPSULE | ORAL | Status: DC
Start: 2018-12-18 — End: 2018-12-18

## 2018-12-18 MED ORDER — AMIODARONE HCL 200 MG PO TABS
200.00 | ORAL_TABLET | ORAL | Status: DC
Start: 2018-12-19 — End: 2018-12-18

## 2018-12-19 DIAGNOSIS — I2510 Atherosclerotic heart disease of native coronary artery without angina pectoris: Secondary | ICD-10-CM | POA: Diagnosis not present

## 2018-12-19 DIAGNOSIS — I429 Cardiomyopathy, unspecified: Secondary | ICD-10-CM | POA: Diagnosis not present

## 2018-12-19 DIAGNOSIS — Z8679 Personal history of other diseases of the circulatory system: Secondary | ICD-10-CM | POA: Diagnosis not present

## 2018-12-19 DIAGNOSIS — K922 Gastrointestinal hemorrhage, unspecified: Secondary | ICD-10-CM | POA: Diagnosis not present

## 2018-12-22 ENCOUNTER — Encounter: Payer: Self-pay | Admitting: Emergency Medicine

## 2018-12-22 ENCOUNTER — Other Ambulatory Visit: Payer: Self-pay

## 2018-12-22 ENCOUNTER — Inpatient Hospital Stay: Payer: Medicare Other

## 2018-12-22 ENCOUNTER — Emergency Department: Payer: Medicare Other

## 2018-12-22 ENCOUNTER — Encounter: Payer: Self-pay | Admitting: Pulmonary Disease

## 2018-12-22 ENCOUNTER — Inpatient Hospital Stay
Admission: EM | Admit: 2018-12-22 | Discharge: 2019-01-03 | DRG: 291 | Disposition: E | Payer: Medicare Other | Source: Ambulatory Visit | Attending: Pulmonary Disease | Admitting: Pulmonary Disease

## 2018-12-22 DIAGNOSIS — Z515 Encounter for palliative care: Secondary | ICD-10-CM | POA: Diagnosis not present

## 2018-12-22 DIAGNOSIS — I9589 Other hypotension: Secondary | ICD-10-CM | POA: Diagnosis present

## 2018-12-22 DIAGNOSIS — I4819 Other persistent atrial fibrillation: Secondary | ICD-10-CM | POA: Diagnosis present

## 2018-12-22 DIAGNOSIS — I110 Hypertensive heart disease with heart failure: Principal | ICD-10-CM | POA: Diagnosis present

## 2018-12-22 DIAGNOSIS — J90 Pleural effusion, not elsewhere classified: Secondary | ICD-10-CM | POA: Diagnosis not present

## 2018-12-22 DIAGNOSIS — J9601 Acute respiratory failure with hypoxia: Secondary | ICD-10-CM | POA: Diagnosis not present

## 2018-12-22 DIAGNOSIS — I5023 Acute on chronic systolic (congestive) heart failure: Secondary | ICD-10-CM | POA: Diagnosis present

## 2018-12-22 DIAGNOSIS — Z4682 Encounter for fitting and adjustment of non-vascular catheter: Secondary | ICD-10-CM | POA: Diagnosis not present

## 2018-12-22 DIAGNOSIS — Z888 Allergy status to other drugs, medicaments and biological substances status: Secondary | ICD-10-CM

## 2018-12-22 DIAGNOSIS — R531 Weakness: Secondary | ICD-10-CM | POA: Diagnosis not present

## 2018-12-22 DIAGNOSIS — I959 Hypotension, unspecified: Secondary | ICD-10-CM | POA: Diagnosis not present

## 2018-12-22 DIAGNOSIS — I059 Rheumatic mitral valve disease, unspecified: Secondary | ICD-10-CM | POA: Diagnosis present

## 2018-12-22 DIAGNOSIS — Z801 Family history of malignant neoplasm of trachea, bronchus and lung: Secondary | ICD-10-CM

## 2018-12-22 DIAGNOSIS — Z8711 Personal history of peptic ulcer disease: Secondary | ICD-10-CM

## 2018-12-22 DIAGNOSIS — N170 Acute kidney failure with tubular necrosis: Secondary | ICD-10-CM | POA: Diagnosis present

## 2018-12-22 DIAGNOSIS — E875 Hyperkalemia: Secondary | ICD-10-CM | POA: Diagnosis not present

## 2018-12-22 DIAGNOSIS — D509 Iron deficiency anemia, unspecified: Secondary | ICD-10-CM | POA: Diagnosis present

## 2018-12-22 DIAGNOSIS — G4730 Sleep apnea, unspecified: Secondary | ICD-10-CM | POA: Diagnosis present

## 2018-12-22 DIAGNOSIS — E785 Hyperlipidemia, unspecified: Secondary | ICD-10-CM | POA: Diagnosis present

## 2018-12-22 DIAGNOSIS — H5461 Unqualified visual loss, right eye, normal vision left eye: Secondary | ICD-10-CM | POA: Diagnosis present

## 2018-12-22 DIAGNOSIS — J9811 Atelectasis: Secondary | ICD-10-CM | POA: Diagnosis not present

## 2018-12-22 DIAGNOSIS — I509 Heart failure, unspecified: Secondary | ICD-10-CM | POA: Diagnosis not present

## 2018-12-22 DIAGNOSIS — D649 Anemia, unspecified: Secondary | ICD-10-CM | POA: Diagnosis not present

## 2018-12-22 DIAGNOSIS — I428 Other cardiomyopathies: Secondary | ICD-10-CM | POA: Diagnosis not present

## 2018-12-22 DIAGNOSIS — R4020 Unspecified coma: Secondary | ICD-10-CM | POA: Diagnosis present

## 2018-12-22 DIAGNOSIS — R578 Other shock: Secondary | ICD-10-CM | POA: Diagnosis present

## 2018-12-22 DIAGNOSIS — E871 Hypo-osmolality and hyponatremia: Secondary | ICD-10-CM

## 2018-12-22 DIAGNOSIS — Z9049 Acquired absence of other specified parts of digestive tract: Secondary | ICD-10-CM | POA: Diagnosis not present

## 2018-12-22 DIAGNOSIS — K219 Gastro-esophageal reflux disease without esophagitis: Secondary | ICD-10-CM | POA: Diagnosis present

## 2018-12-22 DIAGNOSIS — R001 Bradycardia, unspecified: Secondary | ICD-10-CM | POA: Diagnosis present

## 2018-12-22 DIAGNOSIS — R601 Generalized edema: Secondary | ICD-10-CM

## 2018-12-22 DIAGNOSIS — R464 Slowness and poor responsiveness: Secondary | ICD-10-CM | POA: Diagnosis not present

## 2018-12-22 DIAGNOSIS — I10 Essential (primary) hypertension: Secondary | ICD-10-CM | POA: Diagnosis not present

## 2018-12-22 DIAGNOSIS — R188 Other ascites: Secondary | ICD-10-CM | POA: Diagnosis not present

## 2018-12-22 HISTORY — DX: Hypo-osmolality and hyponatremia: E87.1

## 2018-12-22 LAB — BLOOD GAS, ARTERIAL
Acid-base deficit: 0.2 mmol/L (ref 0.0–2.0)
Bicarbonate: 24 mmol/L (ref 20.0–28.0)
FIO2: 50
LHR: 18 {breaths}/min
MECHVT: 500 mL
O2 Saturation: 98.9 %
PEEP: 5 cmH2O
Patient temperature: 37
pCO2 arterial: 37 mmHg (ref 32.0–48.0)
pH, Arterial: 7.42 (ref 7.350–7.450)
pO2, Arterial: 126 mmHg — ABNORMAL HIGH (ref 83.0–108.0)

## 2018-12-22 LAB — CBC WITH DIFFERENTIAL/PLATELET
Abs Immature Granulocytes: 0.23 10*3/uL — ABNORMAL HIGH (ref 0.00–0.07)
BASOS ABS: 0 10*3/uL (ref 0.0–0.1)
Basophils Relative: 0 %
Eosinophils Absolute: 0 10*3/uL (ref 0.0–0.5)
Eosinophils Relative: 0 %
HCT: 25.9 % — ABNORMAL LOW (ref 39.0–52.0)
Hemoglobin: 8.1 g/dL — ABNORMAL LOW (ref 13.0–17.0)
Immature Granulocytes: 2 %
LYMPHS ABS: 0.7 10*3/uL (ref 0.7–4.0)
Lymphocytes Relative: 7 %
MCH: 25.6 pg — ABNORMAL LOW (ref 26.0–34.0)
MCHC: 31.3 g/dL (ref 30.0–36.0)
MCV: 81.7 fL (ref 80.0–100.0)
Monocytes Absolute: 0.9 10*3/uL (ref 0.1–1.0)
Monocytes Relative: 8 %
NRBC: 1.6 % — AB (ref 0.0–0.2)
Neutro Abs: 9.2 10*3/uL — ABNORMAL HIGH (ref 1.7–7.7)
Neutrophils Relative %: 83 %
Platelets: 536 10*3/uL — ABNORMAL HIGH (ref 150–400)
RBC: 3.17 MIL/uL — ABNORMAL LOW (ref 4.22–5.81)
RDW: 18.6 % — AB (ref 11.5–15.5)
Smear Review: UNDETERMINED
WBC: 11.1 10*3/uL — ABNORMAL HIGH (ref 4.0–10.5)

## 2018-12-22 LAB — URINALYSIS, COMPLETE (UACMP) WITH MICROSCOPIC
Bacteria, UA: NONE SEEN
Bilirubin Urine: NEGATIVE
Glucose, UA: NEGATIVE mg/dL
Hgb urine dipstick: NEGATIVE
Ketones, ur: NEGATIVE mg/dL
Leukocytes,Ua: NEGATIVE
Nitrite: NEGATIVE
Protein, ur: NEGATIVE mg/dL
Specific Gravity, Urine: 1.02 (ref 1.005–1.030)
Squamous Epithelial / HPF: NONE SEEN (ref 0–5)
pH: 5 (ref 5.0–8.0)

## 2018-12-22 LAB — BLOOD CULTURE ID PANEL (REFLEXED)
Acinetobacter baumannii: NOT DETECTED
CANDIDA ALBICANS: NOT DETECTED
CANDIDA GLABRATA: NOT DETECTED
Candida krusei: NOT DETECTED
Candida parapsilosis: NOT DETECTED
Candida tropicalis: NOT DETECTED
ENTEROBACTER CLOACAE COMPLEX: NOT DETECTED
ENTEROCOCCUS SPECIES: NOT DETECTED
Enterobacteriaceae species: NOT DETECTED
Escherichia coli: NOT DETECTED
Haemophilus influenzae: NOT DETECTED
Klebsiella oxytoca: NOT DETECTED
Klebsiella pneumoniae: NOT DETECTED
LISTERIA MONOCYTOGENES: NOT DETECTED
Neisseria meningitidis: NOT DETECTED
Proteus species: NOT DETECTED
Pseudomonas aeruginosa: NOT DETECTED
Serratia marcescens: NOT DETECTED
Staphylococcus aureus (BCID): NOT DETECTED
Staphylococcus species: NOT DETECTED
Streptococcus agalactiae: NOT DETECTED
Streptococcus pneumoniae: DETECTED — AB
Streptococcus pyogenes: NOT DETECTED
Streptococcus species: DETECTED — AB

## 2018-12-22 LAB — GLUCOSE, CAPILLARY
Glucose-Capillary: 88 mg/dL (ref 70–99)
Glucose-Capillary: 130 mg/dL — ABNORMAL HIGH (ref 70–99)

## 2018-12-22 LAB — RESPIRATORY PANEL BY PCR
Adenovirus: NOT DETECTED
Bordetella pertussis: NOT DETECTED
CORONAVIRUS OC43-RVPPCR: NOT DETECTED
Chlamydophila pneumoniae: NOT DETECTED
Coronavirus 229E: NOT DETECTED
Coronavirus HKU1: NOT DETECTED
Coronavirus NL63: NOT DETECTED
Influenza A H1 2009: NOT DETECTED
Influenza A H1: NOT DETECTED
Influenza A H3: NOT DETECTED
Influenza A: NOT DETECTED
Influenza B: NOT DETECTED
Metapneumovirus: NOT DETECTED
Mycoplasma pneumoniae: NOT DETECTED
PARAINFLUENZA VIRUS 1-RVPPCR: NOT DETECTED
PARAINFLUENZA VIRUS 2-RVPPCR: NOT DETECTED
Parainfluenza Virus 3: NOT DETECTED
Parainfluenza Virus 4: NOT DETECTED
Respiratory Syncytial Virus: NOT DETECTED
Rhinovirus / Enterovirus: NOT DETECTED

## 2018-12-22 LAB — IRON AND TIBC
Iron: 15 ug/dL — ABNORMAL LOW (ref 45–182)
Saturation Ratios: 10 % — ABNORMAL LOW (ref 17.9–39.5)
TIBC: 155 ug/dL — ABNORMAL LOW (ref 250–450)
UIBC: 140 ug/dL

## 2018-12-22 LAB — URINE DRUG SCREEN, QUALITATIVE (ARMC ONLY)
Amphetamines, Ur Screen: NOT DETECTED
Barbiturates, Ur Screen: NOT DETECTED
Benzodiazepine, Ur Scrn: NOT DETECTED
Cannabinoid 50 Ng, Ur ~~LOC~~: NOT DETECTED
Cocaine Metabolite,Ur ~~LOC~~: NOT DETECTED
MDMA (Ecstasy)Ur Screen: NOT DETECTED
Methadone Scn, Ur: NOT DETECTED
Opiate, Ur Screen: NOT DETECTED
Phencyclidine (PCP) Ur S: NOT DETECTED
Tricyclic, Ur Screen: NOT DETECTED

## 2018-12-22 LAB — COMPREHENSIVE METABOLIC PANEL
ALT: 21 U/L (ref 0–44)
AST: 40 U/L (ref 15–41)
Albumin: 1.5 g/dL — ABNORMAL LOW (ref 3.5–5.0)
Alkaline Phosphatase: 50 U/L (ref 38–126)
Anion gap: 11 (ref 5–15)
BUN: 58 mg/dL — ABNORMAL HIGH (ref 8–23)
CO2: 23 mmol/L (ref 22–32)
Calcium: 7.1 mg/dL — ABNORMAL LOW (ref 8.9–10.3)
Chloride: 90 mmol/L — ABNORMAL LOW (ref 98–111)
Creatinine, Ser: 2.15 mg/dL — ABNORMAL HIGH (ref 0.61–1.24)
GFR calc Af Amer: 32 mL/min — ABNORMAL LOW (ref >60)
GFR calc non Af Amer: 27 mL/min — ABNORMAL LOW (ref >60)
Glucose, Bld: 106 mg/dL — ABNORMAL HIGH (ref 70–99)
Potassium: 5.8 mmol/L — ABNORMAL HIGH (ref 3.5–5.1)
Sodium: 124 mmol/L — ABNORMAL LOW (ref 135–145)
Total Bilirubin: 2 mg/dL — ABNORMAL HIGH (ref 0.3–1.2)
Total Protein: 4.6 g/dL — ABNORMAL LOW (ref 6.5–8.1)

## 2018-12-22 LAB — TYPE AND SCREEN
ABO/RH(D): O POS
Antibody Screen: NEGATIVE

## 2018-12-22 LAB — PROTIME-INR
INR: 1.3 — ABNORMAL HIGH (ref 0.8–1.2)
Prothrombin Time: 15.9 seconds — ABNORMAL HIGH (ref 11.4–15.2)

## 2018-12-22 LAB — INFLUENZA PANEL BY PCR (TYPE A & B)
Influenza A By PCR: NEGATIVE
Influenza B By PCR: NEGATIVE

## 2018-12-22 LAB — LACTIC ACID, PLASMA
Lactic Acid, Venous: 3.2 mmol/L (ref 0.5–1.9)
Lactic Acid, Venous: 2.4 mmol/L (ref 0.5–1.9)

## 2018-12-22 LAB — PROCALCITONIN: Procalcitonin: 1.72 ng/mL

## 2018-12-22 LAB — APTT: aPTT: 36 seconds (ref 24–36)

## 2018-12-22 LAB — TROPONIN I: Troponin I: 0.15 ng/mL (ref <0.03)

## 2018-12-22 LAB — FERRITIN: Ferritin: 162 ng/mL (ref 24–336)

## 2018-12-22 MED ORDER — ACETAMINOPHEN 325 MG PO TABS: 650.0000 mg | ORAL_TABLET | Freq: Four times a day (QID) | ORAL | Status: DC | PRN

## 2018-12-22 MED ORDER — SODIUM CHLORIDE 0.9 % IV SOLN
1.0000 g | Freq: Once | INTRAVENOUS | Status: DC
  Filled 2018-12-22: qty 10

## 2018-12-22 MED ORDER — IOHEXOL 300 MG/ML  SOLN
100.0000 mL | Freq: Once | INTRAMUSCULAR | Status: AC | PRN
  Administered 2018-12-22: 100 mL via INTRAVENOUS

## 2018-12-22 MED ORDER — ETOMIDATE 2 MG/ML IV SOLN
20.0000 mg | Freq: Once | INTRAVENOUS | Status: AC
  Administered 2018-12-22: 20 mg via INTRAVENOUS

## 2018-12-22 MED ORDER — FENTANYL 2500MCG IN NS 250ML (10MCG/ML) PREMIX INFUSION
0.0000 ug/h | INTRAVENOUS | Status: DC
  Administered 2018-12-22: 25 ug/h via INTRAVENOUS
  Filled 2018-12-22: qty 250

## 2018-12-22 MED ORDER — SODIUM CHLORIDE 0.9 % IV SOLN
2.0000 g | Freq: Once | INTRAVENOUS | Status: AC
  Administered 2018-12-22: 2 g via INTRAVENOUS
  Filled 2018-12-22: qty 2

## 2018-12-22 MED ORDER — SODIUM POLYSTYRENE SULFONATE 15 GM/60ML PO SUSP: 45.0000 g | Freq: Once | ORAL | Status: DC

## 2018-12-22 MED ORDER — VANCOMYCIN HCL 500 MG IV SOLR
500.0000 mg | Freq: Once | INTRAVENOUS | Status: DC
  Filled 2018-12-22: qty 500

## 2018-12-22 MED ORDER — SODIUM CHLORIDE 0.9 % IV BOLUS
500.0000 mL | Freq: Once | INTRAVENOUS | Status: AC
  Administered 2018-12-22: 500 mL via INTRAVENOUS

## 2018-12-22 MED ORDER — ONDANSETRON HCL 4 MG/2ML IJ SOLN: 4.0000 mg | Freq: Four times a day (QID) | INTRAMUSCULAR | Status: DC | PRN

## 2018-12-22 MED ORDER — SUCCINYLCHOLINE CHLORIDE 20 MG/ML IJ SOLN: 100.0000 mg | Freq: Once | INTRAMUSCULAR | Status: DC

## 2018-12-22 MED ORDER — SODIUM BICARBONATE 8.4 % IV SOLN: 50.0000 meq | Freq: Once | INTRAVENOUS | Status: DC

## 2018-12-22 MED ORDER — SODIUM BICARBONATE 8.4 % IV SOLN
50.0000 meq | Freq: Once | INTRAVENOUS | Status: AC
  Administered 2018-12-22: 50 meq via INTRAVENOUS

## 2018-12-22 MED ORDER — INSULIN ASPART 100 UNIT/ML IV SOLN
10.0000 [IU] | Freq: Once | INTRAVENOUS | Status: DC
  Filled 2018-12-22: qty 0.1

## 2018-12-22 MED ORDER — ROCURONIUM BROMIDE 50 MG/5ML IV SOLN
20.0000 mg | Freq: Once | INTRAVENOUS | Status: AC
  Administered 2018-12-22: 20 mg via INTRAVENOUS
  Filled 2018-12-22: qty 2

## 2018-12-22 MED ORDER — SODIUM CHLORIDE 0.9 % IV BOLUS
1000.0000 mL | Freq: Once | INTRAVENOUS | Status: AC
  Administered 2018-12-22: 1000 mL via INTRAVENOUS

## 2018-12-22 MED ORDER — DEXTROSE 50 % IV SOLN: 25.0000 g | Freq: Once | INTRAVENOUS | Status: DC

## 2018-12-22 MED ORDER — ACETAMINOPHEN 650 MG RE SUPP: 650.0000 mg | Freq: Four times a day (QID) | RECTAL | Status: DC | PRN

## 2018-12-22 MED ORDER — ALBUMIN HUMAN 25 % IV SOLN
50.0000 g | Freq: Once | INTRAVENOUS | Status: AC
  Administered 2018-12-22: 50 g via INTRAVENOUS
  Filled 2018-12-22: qty 200

## 2018-12-22 MED ORDER — NOREPINEPHRINE BITARTRATE 1 MG/ML IV SOLN
0.0000 ug/min | INTRAVENOUS | Status: DC
  Administered 2018-12-22: 15 ug/min via INTRAVENOUS
  Filled 2018-12-22 (×2): qty 4

## 2018-12-22 MED ORDER — VANCOMYCIN HCL IN DEXTROSE 1-5 GM/200ML-% IV SOLN
1000.0000 mg | Freq: Once | INTRAVENOUS | Status: AC
  Administered 2018-12-22: 1000 mg via INTRAVENOUS
  Filled 2018-12-22: qty 200

## 2018-12-22 MED ORDER — ONDANSETRON HCL 4 MG PO TABS: 4.0000 mg | ORAL_TABLET | Freq: Four times a day (QID) | ORAL | Status: DC | PRN

## 2018-12-22 MED ORDER — MORPHINE 100MG IN NS 100ML (1MG/ML) PREMIX INFUSION
1.0000 mg/h | INTRAVENOUS | Status: DC
  Administered 2018-12-22: 5 mg/h via INTRAVENOUS

## 2018-12-25 ENCOUNTER — Telehealth: Payer: Self-pay | Admitting: Family Medicine

## 2018-12-25 LAB — CULTURE, BLOOD (ROUTINE X 2)
Special Requests: ADEQUATE
Special Requests: ADEQUATE

## 2018-12-25 NOTE — Telephone Encounter (Signed)
Called and LMOVM for widow.  Called and offered condolences to his son.  He thanked me for the call.   I thank all involved.

## 2019-01-03 NOTE — ED Notes (Signed)
Right arm elevated

## 2019-01-03 NOTE — Progress Notes (Signed)
Ch was pg regarding the pt that is dying. Pt family members were under a great deal of emotional distress due to the sudden decline of the pt that has been recently hospitalized and place din rehab. Pt family were moving towards a place of acceptance yet still angry with the sudden turn of events. Ch encouraged family gather as many family members as possible before the pt passes. Ch also encouraged pt family to move to comfort care and change the pt from being full code if they did not want him to be resuscitated. Ch made sure that another ch was able to attend to the family as they grieved during this challenging time regarding their loved one.      2019/01/01 1500  Clinical Encounter Type  Visited With Patient and family together;Health care provider  Visit Type Social support;Spiritual support;Psychological support;Patient actively dying  Referral From Nurse  Consult/Referral To Chaplain  Spiritual Encounters  Spiritual Needs Emotional;Grief support  Stress Factors  Patient Stress Factors Health changes;None identified;Major life changes;Loss of control;Loss  Family Stress Factors Exhausted;Family relationships;Loss

## 2019-01-03 NOTE — ED Provider Notes (Addendum)
[facility] Emergency Department Provider Note    First MD Initiated Contact with Patient 2019-01-09 1028     (approximate)  I have reviewed the triage vital signs and the nursing notes.   HISTORY  Chief Complaint unresponsive  Level V Caveat:  unresponsive  HPI [name] is a 85 y.o. male patient arrives via wheelchair from [place] clinic unresponsive.  Patient arrives pale mottled with agonal respirations.  No additional history provided.  Patient did have very weak femoral pulse but delayed cap refill.  He had agonal respirations.  Bag-valve-mask was initiated patient was unresponsive was initially withdrawing to pain intermittently moaning but clearly not protecting his airway.  No report of any trauma.  Patient intubated for airway protection.  Glucose is 86.   Past Medical History:  Diagnosis Date  . Blind right eye   . Diaphragmatic hernia without mention of obstruction or gangrene   . Diverticulosis of colon (without mention of hemorrhage)   . Dizziness and giddiness   . Esophageal reflux   . Gastritis 12/18/08   EGD, 3 clips remaining (Dr. Vira Agar)  . Hypertension   . Iron deficiency anemia    a.  Secondary to recurrent GI bleeding  . Macular degeneration   . Nonischemic cardiomyopathy (HCC)    a.  TTE and 3/15: EF 35 to 40%, mild left ear, mitral valve repair with mild to moderate MR, mild to moderate TR, PASP 48; b.  TTE 3/18: EF 25 to 30%, not technically sufficient to allow for LV diastolic function, mild left ear possibly underestimated secondary to degree of CM, moderate AI calcified mitral annulus, moderate MR mod to sev dilated LA mod dilated RA moderate TR PASP 71  . Persistent atrial fibrillation    a.  Status post multiple cardioversions; b.  Previously on Coumadin though held secondary to recurrent GI bleed  . Personal history of peptic ulcer disease   . Pure hypercholesterolemia   . Sprain of neck   . Unspecified  hemorrhoids without mention of complication   . Unspecified sleep apnea   . Upper GI bleed 7/15-7/20/09   a.  Recurrent GI bleed dating back to at least 2009 with subsequent bleed in 2010, multiple GI bleeds in 01/2018   Family History  Problem Relation Age of Onset  . Cancer Father        lung  . Colon cancer Neg Hx   . Prostate cancer Neg Hx    Past Surgical History:  Procedure Laterality Date  . APPENDECTOMY    . CARDIAC CATHETERIZATION  1996   [facility]  . CARDIAC CATHETERIZATION    . CARDIAC CATHETERIZATION    . CARDIOVERSION  12/17/2011   Procedure: CARDIOVERSION;  Surgeon: Hillary Bow, MD;  Location: [place];  Service: Cardiovascular;  Laterality: N/A;  . CATARACT EXTRACTION  10/13/08   left  . CHOLECYSTECTOMY    . ESOPHAGOGASTRODUODENOSCOPY Left 01/17/2018   Procedure: ESOPHAGOGASTRODUODENOSCOPY (EGD);  Surgeon: Virgel Manifold, MD;  Location: [facility] ENDOSCOPY;  Service: Endoscopy;  Laterality: Left;  . ESOPHAGOGASTRODUODENOSCOPY N/A 12/07/2018   Procedure: ESOPHAGOGASTRODUODENOSCOPY (EGD);  Surgeon: Toledo, Benay Pike, MD;  Location: [facility] ENDOSCOPY;  Service: Gastroenterology;  Laterality: N/A;  . ESOPHAGOGASTRODUODENOSCOPY N/A 12/10/2018   Procedure: ESOPHAGOGASTRODUODENOSCOPY (EGD);  Surgeon: Virgel Manifold, MD;  Location: [facility] ENDOSCOPY;  Service: Endoscopy;  Laterality: N/A;  . ESOPHAGOGASTRODUODENOSCOPY (EGD) WITH PROPOFOL N/A 01/04/2018   Procedure: ESOPHAGOGASTRODUODENOSCOPY (EGD) WITH PROPOFOL;  Surgeon: [name], MD;  Location: [facility] ENDOSCOPY;  Service: Endoscopy;  Laterality: N/A;  . MITRAL VALVE ANNULOPLASTY  1996  . MITRAL VALVE REPAIR  1995  . PARTIAL GASTRECTOMY     Patient Active Problem List   Diagnosis Date Noted  . Acute hypoxemic respiratory failure ([place]) January 20, 2019  . GI bleed 12/09/2018  . Acute blood loss anemia 12/09/2018  . Acute gastric ulcer with hemorrhage   . Stomach irritation   . Hyponatremia 01/07/2018  . Blood in stool 01/02/2018   . Rhinitis 11/04/2017  . Bilateral carotid bruits 11/17/2015  . H. pylori infection 10/26/2015  . Persistent atrial fibrillation 04/28/2015  . Medicare annual wellness visit, subsequent 01/31/2015  . Advance care planning 01/31/2015  . Long term current use of anticoagulant therapy 01/30/2015  . Abnormal TSH 02/16/2014  . Chronic systolic heart failure ([place]) 08/16/2013  . Fatigue 09/08/2012  . Cardiomyopathy, dilated, nonischemic ([place]) 12/19/2011  . Hypertension 01/20/2011  . ROSACEA 11/12/2009  . BPH with obstruction/lower urinary tract symptoms 09/03/2009  . MITRAL VALVE REPLACEMENT, HX OF 06/11/2009  . OTHER AND UNSPECIFIED MITRAL VALVE DISEASES 03/04/2009  . PEPTIC ULCER DISEASE, HX OF 02/27/2009  . ANEMIA, IRON DEFICIENCY 04/09/2008  . GERD 04/09/2008  . HYPERCHOLESTEROLEMIA 04/08/2008  . LEFT VENTRICULAR FAILURE 04/08/2008  . HEMORRHOIDS 04/08/2008  . HIATAL HERNIA 04/08/2008  . DIVERTICULOSIS, COLON 04/08/2008  . SLEEP APNEA 04/08/2008  . GASTROINTESTINAL HEMORRHAGE, HX OF 04/08/2008      Prior to Admission medications   Medication Sig Start Date End Date Taking? Authorizing Provider  acetaminophen (TYLENOL) 325 MG tablet Take 650 mg by mouth every 6 (six) hours as needed for mild pain or moderate pain.   Yes [provider]  amiodarone (PACERONE) 200 MG tablet Take 200 mg by mouth daily.   Yes [provider]  eplerenone (INSPRA) 25 MG tablet Take 25 mg by mouth daily.   Yes [provider]  finasteride (PROSCAR) 5 MG tablet Take 5 mg by mouth daily.   Yes [provider]  hydroxypropyl methylcellulose / hypromellose (ISOPTO TEARS / GONIOVISC) 2.5 % ophthalmic solution Place 1 drop into the left eye as needed for dry eyes.   Yes [provider]  lisinopril (PRINIVIL,ZESTRIL) 2.5 MG tablet Take 2.5 mg by mouth daily.   Yes [provider]  Melatonin 3 MG TABS Take 3 mg by mouth at bedtime.   Yes [provider]  metoprolol succinate (TOPROL-XL) 50 MG 24 hr tablet Take 50 mg by mouth 2 (two) times daily. Take with or immediately following a meal.   Yes [provider]  ondansetron (ZOFRAN) 4 MG tablet Take 4 mg by mouth every 8 (eight) hours as needed for nausea or vomiting.   Yes [provider]  pantoprazole (PROTONIX) 40 MG tablet Take 40 mg by mouth 2 (two) times daily.   Yes [provider]  simethicone (MYLICON) 80 MG chewable tablet Chew 80 mg by mouth every 6 (six) hours as needed for flatulence.   Yes [provider]  sucralfate (CARAFATE) 1 g tablet Take 1 g by mouth 4 (four) times daily -  with meals and at bedtime.   Yes [provider]  tamsulosin (FLOMAX) 0.4 MG CAPS capsule Take 0.4 mg by mouth daily.   Yes [provider]    Allergies Atorvastatin and Statins    Social History Social History   Tobacco Use  . Smoking status: Never Smoker  . Smokeless tobacco: Never Used  Substance Use Topics  . Alcohol use: Yes  Comment: socially, beer   . Drug use: No    Review of Systems Patient denies headaches, rhinorrhea, blurry vision, numbness, shortness of breath, chest pain, edema, cough, abdominal pain, nausea, vomiting, diarrhea, dysuria, fevers, rashes or hallucinations unless otherwise stated above in HPI. ____________________________________________   PHYSICAL EXAM:  VITAL SIGNS: Vitals:   Jan 16, 2019 1251 January 16, 2019 1310  BP:  96/63  Pulse: (!) 104 (!) 104  Resp: 19 14  Temp:    SpO2: 100% 98%    Constitutional:  Critically ill in respratory distress Eyes: pupils are mid point Head: Atraumatic. Nose: No congestion/rhinnorhea. Mouth/Throat: Mucous membranes are moist.   Neck: No stridor. Painless ROM.  Cardiovascular: poor peripheral circulation, initially bradycardic to 30's Respiratory: agonal respirations Gastrointestinal: Soft and nontender. No distention. No abdominal bruits. No CVA tenderness.  Genitourinary: normal external genitalia Musculoskeletal:pitting edema in all 4 extremitie Neurologic:  GCS 3 Skin:  Cool mottled, poor peripheral perfusion Psychiatric: unable to assess  ____________________________________________   LABS (all labs ordered are listed, but only abnormal results are displayed)  Results for orders placed or performed during the hospital encounter of 16-Jan-2019 (from the past 24 hour(s))  Glucose, capillary     Status: None   Collection Time: 16-Jan-2019  9:54 AM  Result Value Ref Range   Glucose-Capillary 88 70 - 99 mg/dL  CBC with Differential     Status: Abnormal   Collection Time: 2019/01/16 10:20 AM  Result Value Ref Range   WBC 11.1 (H) 4.0 - 10.5 K/uL   RBC 3.17 (L) 4.22 - 5.81 MIL/uL   Hemoglobin 8.1 (L) 13.0 - 17.0 g/dL   HCT 25.9 (L) 39.0 - 52.0 %   MCV 81.7 80.0 - 100.0 fL   MCH 25.6 (L) 26.0 - 34.0 pg   MCHC 31.3 30.0 - 36.0 g/dL   RDW 18.6 (H) 11.5 - 15.5 %   Platelets 536 (H) 150 - 400 K/uL   nRBC 1.6 (H) 0.0 - 0.2 %   Neutrophils Relative % 83 %   Neutro Abs 9.2 (H) 1.7 - 7.7 K/uL   Lymphocytes Relative 7 %   Lymphs Abs 0.7 0.7 - 4.0 K/uL   Monocytes Relative 8 %   Monocytes Absolute 0.9 0.1 - 1.0 K/uL   Eosinophils Relative 0 %   Eosinophils Absolute 0.0 0.0 - 0.5 K/uL   Basophils Relative 0 %   Basophils Absolute 0.0 0.0 - 0.1 K/uL   WBC Morphology MILD LEFT SHIFT (1-5% METAS, OCC MYELO, OCC BANDS)    Smear Review PLATELET CLUMPS NOTED ON SMEAR, UNABLE TO ESTIMATE    Immature Granulocytes 2 %   Abs Immature Granulocytes 0.23 (H) 0.00 - 0.07 K/uL   Burr Cells PRESENT    Target Cells PRESENT   Comprehensive metabolic panel     Status: Abnormal   Collection Time: 2019-01-16 10:20 AM  Result Value Ref Range   Sodium 124 (L) 135 - 145 mmol/L   Potassium 5.8 (H) 3.5 - 5.1 mmol/L   Chloride 90 (L) 98 - 111 mmol/L   CO2 23 22 - 32 mmol/L   Glucose, Bld 106 (H) 70 - 99 mg/dL   BUN 58 (H) 8 - 23 mg/dL   Creatinine, Ser 2.15 (H) 0.61  - 1.24 mg/dL   Calcium 7.1 (L) 8.9 - 10.3 mg/dL   Total Protein 4.6 (L) 6.5 - 8.1 g/dL   Albumin 1.5 (L) 3.5 - 5.0 g/dL   AST 40 15 - 41 U/L   ALT 21 0 -  44 U/L   Alkaline Phosphatase 50 38 - 126 U/L   Total Bilirubin 2.0 (H) 0.3 - 1.2 mg/dL   GFR calc non Af Amer 27 (L) >60 mL/min   GFR calc Af Amer 32 (L) >60 mL/min   Anion gap 11 5 - 15  Troponin I - ONCE - STAT     Status: Abnormal   Collection Time: 2019/01/15 10:20 AM  Result Value Ref Range   Troponin I 0.15 (HH) <0.03 ng/mL  Lactic acid, plasma     Status: Abnormal   Collection Time: 01-15-19 10:20 AM  Result Value Ref Range   Lactic Acid, Venous 3.2 (HH) 0.5 - 1.9 mmol/L  Protime-INR     Status: Abnormal   Collection Time: January 15, 2019 10:20 AM  Result Value Ref Range   Prothrombin Time 15.9 (H) 11.4 - 15.2 seconds   INR 1.3 (H) 0.8 - 1.2  APTT     Status: None   Collection Time: 01/15/19 10:20 AM  Result Value Ref Range   aPTT 36 24 - 36 seconds  Type and screen [place]     Status: None   Collection Time: 01-15-2019 10:22 AM  Result Value Ref Range   ABO/RH(D) O POS    Antibody Screen NEG    Sample Expiration      12/25/2018 Performed at [facility], [address] [place], [place] [address]   Urinalysis, Complete w Microscopic     Status: Abnormal   Collection Time: 2019/01/15 10:39 AM  Result Value Ref Range   Color, Urine AMBER (A) YELLOW   APPearance CLEAR (A) CLEAR   Specific Gravity, Urine 1.020 1.005 - 1.030   pH 5.0 5.0 - 8.0   Glucose, UA NEGATIVE NEGATIVE mg/dL   Hgb urine dipstick NEGATIVE NEGATIVE   Bilirubin Urine NEGATIVE NEGATIVE   Ketones, ur NEGATIVE NEGATIVE mg/dL   Protein, ur NEGATIVE NEGATIVE mg/dL   Nitrite NEGATIVE NEGATIVE   Leukocytes,Ua NEGATIVE NEGATIVE   RBC / HPF 0-5 0 - 5 RBC/hpf   WBC, UA 0-5 0 - 5 WBC/hpf   Bacteria, UA NONE SEEN NONE SEEN   Squamous Epithelial / LPF NONE SEEN 0 - 5   Mucus PRESENT    Hyaline Casts, UA PRESENT   Urine Drug  Screen, Qualitative ([facility] only)     Status: None   Collection Time: 01-15-19 10:39 AM  Result Value Ref Range   Tricyclic, Ur Screen NONE DETECTED NONE DETECTED   Amphetamines, Ur Screen NONE DETECTED NONE DETECTED   MDMA (Ecstasy)Ur Screen NONE DETECTED NONE DETECTED   Cocaine Metabolite,Ur [place] NONE DETECTED NONE DETECTED   Opiate, Ur Screen NONE DETECTED NONE DETECTED   Phencyclidine (PCP) Ur S NONE DETECTED NONE DETECTED   Cannabinoid 50 Ng, Ur [place] NONE DETECTED NONE DETECTED   Barbiturates, Ur Screen NONE DETECTED NONE DETECTED   Benzodiazepine, Ur Scrn NONE DETECTED NONE DETECTED   Methadone Scn, Ur NONE DETECTED NONE DETECTED  Blood gas, arterial ([facility] & [facility] ONLY)     Status: Abnormal   Collection Time: 15-Jan-2019 11:48 AM  Result Value Ref Range   FIO2 50.00    Delivery systems VENTILATOR    Mode ASSIST CONTROL    VT 500 mL   LHR 18 resp/min   Peep/cpap 5.0 cm H20   pH, Arterial 7.42 7.350 - 7.450   pCO2 arterial 37 32.0 - 48.0 mmHg   pO2, Arterial 126 (H) 83.0 - 108.0 mmHg   Bicarbonate 24.0 20.0 -  28.0 mmol/L   Acid-base deficit 0.2 0.0 - 2.0 mmol/L   O2 Saturation 98.9 %   Patient temperature 37.0    Collection site RIGHT RADIAL    Sample type ARTERIAL DRAW    Allens test (pass/fail) PASS PASS  Lactic acid, plasma     Status: Abnormal   Collection Time: 2019/01/09 12:20 PM  Result Value Ref Range   Lactic Acid, Venous 2.4 (HH) 0.5 - 1.9 mmol/L  Influenza panel by PCR (type A & B)     Status: None   Collection Time: January 09, 2019 12:26 PM  Result Value Ref Range   Influenza A By PCR NEGATIVE NEGATIVE   Influenza B By PCR NEGATIVE NEGATIVE   ____________________________________________  EKG My review and personal interpretation at Time: 9:51   Indication: respiratory distress  Rate: 60  Rhythm: sinus Axis: normal Other: low voltage, twave normalizaion of V4-V6 as compared to precious ____________________________________________  RADIOLOGY  I personally reviewed all  radiographic images ordered to evaluate for the above acute complaints and reviewed radiology reports and findings.  These findings were personally discussed with the patient.  Please see medical record for radiology report.    EMERGENCY DEPARTMENT [place] CARDIAC EXAM "Study: Limited Ultrasound of the Heart and Pericardium"  INDICATIONS:  unresponsive Multiple views of the heart and pericardium were obtained in real-time with a multi-frequency probe.  PERFORMED OZ:DGUYQI IMAGES ARCHIVED?: No LIMITATIONS:  Emergent procedure VIEWS USED: Subcostal 4 chamber and Apical 4 chamber  INTERPRETATION: decreased EF,  No pericardial effusion      ____________________________________________   PROCEDURES  Procedure(s) performed:  .Critical Care Performed by: Merlyn Lot, MD Authorized by: Merlyn Lot, MD   Critical care provider statement:    Critical care time (minutes):  35   Critical care time was exclusive of:  Separately billable procedures and treating other patients   Critical care was necessary to treat or prevent imminent or life-threatening deterioration of the following conditions:  Respiratory failure, circulatory failure and cardiac failure   Critical care was time spent personally by me on the following activities:  Development of treatment plan with patient or surrogate, discussions with consultants, evaluation of patient's response to treatment, examination of patient, obtaining history from patient or surrogate, ordering and performing treatments and interventions, ordering and review of laboratory studies, ordering and review of radiographic studies, pulse oximetry, re-evaluation of patient's condition and review of old charts Procedure Name: Intubation Date/Time: 01-09-19 11:19 AM Performed by: Merlyn Lot, MD Pre-anesthesia Checklist: Patient identified, Patient being monitored, Emergency Drugs available, Timeout performed and Suction available Oxygen  Delivery Method: Non-rebreather mask Preoxygenation: Pre-oxygenation with 100% oxygen Induction Type: Rapid sequence Ventilation: Mask ventilation without difficulty Laryngoscope Size: Glidescope and 3 Grade View: Grade I Tube size: 7.5 mm Number of attempts: 1 Airway Equipment and Method: Video-laryngoscopy Placement Confirmation: ETT inserted through vocal cords under direct vision,  CO2 detector and Breath sounds checked- equal and bilateral Secured at: 23 cm Tube secured with: ETT holder    .Central Line Date/Time: 2019-01-09 11:20 AM Performed by: Merlyn Lot, MD Authorized by: Merlyn Lot, MD   Consent:    Consent obtained:  Emergent situation Pre-procedure details:    Hand hygiene: Hand hygiene performed prior to insertion     Sterile barrier technique: All elements of maximal sterile technique followed     Skin preparation:  2% chlorhexidine   Skin preparation agent: Skin preparation agent completely dried prior to procedure   Procedure details:    Location:  R  femoral   Patient position:  Trendelenburg   Procedural supplies:  Triple lumen   Landmarks identified: yes     Ultrasound guidance: yes     Sterile ultrasound techniques: Sterile gel and sterile probe covers were used     Number of attempts:  1   Successful placement: yes   Post-procedure details:    Post-procedure:  Dressing applied and line sutured   Assessment:  Blood return through all ports and free fluid flow   Patient tolerance of procedure:  Tolerated well, no immediate complications      Critical Care performed: yes  ____________________________________________   INITIAL IMPRESSION / ASSESSMENT AND PLAN / ED COURSE  Pertinent labs & imaging results that were available during my care of the patient were reviewed by me and considered in my medical decision making (see chart for details).   DDX: Cardiac arrest, respiratory arrest, pneumonia, pneumothorax, hypoglycemia, AKI and  electrolyte abnormality  [name] is a 85 y.o. who presents to the ED unresponsive and critically ill.  Patient intubated for respiratory arrest.  Patient did not lose pulses but did bradycardia down.  EKG did not show any evidence of STEMI.  Fluids will be started.  Clinical Course as of Dec 22 1319  12/26/2018 Patient's blood pressure now improving on nor epi.  FAST exam shows no intra-abdominal fluid.  Cardiac contractility was really significantly depressed.  I do not observe any pericardial effusion.   [PR]  1045 Patient has increased perfusion on vasopressors.  Still requiring significant FiO2 support.  Chest x-ray shows market cardiomegaly and effusions.   [PR]  1104 Discussed case with radiology.Chest x-ray abnormalities will order CT with IV contrast imaging to further characterize particular given his critical illness and mediastinal widening concerning for mediastinitis as the patient is unable to provide any additional history.   [PR]  4818 Patient with significant bilateral pleural effusions left greater than right with compressive atelectasis.  Given his respiratory distress will give broad-spectrum antibiotics awaiting blood work.  Vasopressor support decreasing.  Have a high suspicion that this was primarily a respiratory arrest.   [PR]  1148 Have made multiple phone calls as well as left messages with family contacts listed.   [PR]  1209 Patient's troponin is elevated 0.15.  Noted to have significant hyponatremia.  I suspect this is likely secondary to demand ischemia in the setting of massive pleural effusions and anasarca.   [PR]    Clinical Course User Index [PR] Merlyn Lot, MD     As part of my medical decision making, I reviewed the following data within the [place] notes reviewed and incorporated, Labs reviewed, notes from prior ED visits and [place] Controlled Substance Database    ____________________________________________   FINAL CLINICAL IMPRESSION(S) / ED DIAGNOSES  Final diagnoses:  Pleural effusion  Acute respiratory failure with hypoxia (HCC)  Anasarca  Hyponatremia  Hypotension, unspecified hypotension type      NEW MEDICATIONS STARTED DURING THIS VISIT:  Current Discharge Medication List       Note:  This document was prepared using Dragon voice recognition software and may include unintentional dictation errors.    Merlyn Lot, MD 12-26-18 [place]    Merlyn Lot, MD 12/26/2018 [phone]

## 2019-01-03 NOTE — ED Notes (Signed)
RN called pharmacy to check on Fentanyl drip. Pharmacy is verifying and mixing medication at this time.

## 2019-01-03 NOTE — ED Notes (Signed)
Pt to CT with zoll, rn, RT and monitor

## 2019-01-03 NOTE — Progress Notes (Signed)
Spoke with patients son and wife at bedside. Family expressed they wanted patient to be comfortable and to pass comfortably. Expressed they did not want any form of resuscitation measures taken. Spoke with family about transitioning to comfort care, medication will be initiated for patients comfort, ET tube would be removed and all other medications would be stopped at that time. Family expressed they would like to proceed with comfort measures at this time.  Spoke with Dr. Lanney Gins and conveyed families wishes. Verbal orders given to initiate Morphine drip and comfort care orders.

## 2019-01-03 NOTE — H&P (Addendum)
[facility] [place] NAME: [name]    MR#:  [id number]  DATE OF BIRTH:  [birth year]  DATE OF ADMISSION:  January 15, 2019  PRIMARY CARE PHYSICIAN: Tonia Ghent, MD   REQUESTING/REFERRING PHYSICIAN: Dr Elige Ko  CHIEF COMPLAINT:   Chief Complaint  Patient presents with  . unresponsive    HISTORY OF PRESENT ILLNESS:  [name]  is a 85 y.o. male who was recently in the hospital for rectal bleeding.  He was following up with gastroenterology today and sent over to the ER.  In the ER he was intubated for respiratory distress hypoxia and hypotension.  The patient is currently unable to give any history.  History obtained from old chart.  Unable to contact the patient's wife on the phone but I was able to contact the patient's granddaughter and she will let family know.  Patient currently on the ventilator, Levophed drip.  PAST MEDICAL HISTORY:   Past Medical History:  Diagnosis Date  . Blind right eye   . Diaphragmatic hernia without mention of obstruction or gangrene   . Diverticulosis of colon (without mention of hemorrhage)   . Dizziness and giddiness   . Esophageal reflux   . Gastritis 12/18/08   EGD, 3 clips remaining (Dr. Vira Agar)  . Hypertension   . Iron deficiency anemia    a.  Secondary to recurrent GI bleeding  . Macular degeneration   . Nonischemic cardiomyopathy (HCC)    a.  TTE and 3/15: EF 35 to 40%, mild left ear, mitral valve repair with mild to moderate MR, mild to moderate TR, PASP 48; b.  TTE 3/18: EF 25 to 30%, not technically sufficient to allow for LV diastolic function, mild left ear possibly underestimated secondary to degree of CM, moderate AI calcified mitral annulus, moderate MR mod to sev dilated LA mod dilated RA moderate TR PASP 71  . Persistent atrial fibrillation    a.  Status post multiple cardioversions; b.  Previously on Coumadin though held secondary to recurrent GI bleed  .  Personal history of peptic ulcer disease   . Pure hypercholesterolemia   . Sprain of neck   . Unspecified hemorrhoids without mention of complication   . Unspecified sleep apnea   . Upper GI bleed 7/15-7/20/09   a.  Recurrent GI bleed dating back to at least 2009 with subsequent bleed in 2010, multiple GI bleeds in 01/2018    PAST SURGICAL HISTORY:   Past Surgical History:  Procedure Laterality Date  . APPENDECTOMY    . CARDIAC CATHETERIZATION  1996   [facility]  . CARDIAC CATHETERIZATION    . CARDIAC CATHETERIZATION    . CARDIOVERSION  12/17/2011   Procedure: CARDIOVERSION;  Surgeon: Hillary Bow, MD;  Location: [facility];  Service: Cardiovascular;  Laterality: N/A;  . CATARACT EXTRACTION  10/13/08   left  . CHOLECYSTECTOMY    . ESOPHAGOGASTRODUODENOSCOPY Left 01/17/2018   Procedure: ESOPHAGOGASTRODUODENOSCOPY (EGD);  Surgeon: Virgel Manifold, MD;  Location: [facility] ENDOSCOPY;  Service: Endoscopy;  Laterality: Left;  . ESOPHAGOGASTRODUODENOSCOPY N/A 12/07/2018   Procedure: ESOPHAGOGASTRODUODENOSCOPY (EGD);  Surgeon: Toledo, Benay Pike, MD;  Location: [facility] ENDOSCOPY;  Service: Gastroenterology;  Laterality: N/A;  . ESOPHAGOGASTRODUODENOSCOPY N/A 12/10/2018   Procedure: ESOPHAGOGASTRODUODENOSCOPY (EGD);  Surgeon: Virgel Manifold, MD;  Location: [facility] ENDOSCOPY;  Service: Endoscopy;  Laterality: N/A;  . ESOPHAGOGASTRODUODENOSCOPY (EGD) WITH PROPOFOL N/A 01/04/2018   Procedure: ESOPHAGOGASTRODUODENOSCOPY (EGD) WITH PROPOFOL;  Surgeon: [name], MD;  Location: [facility]  ENDOSCOPY;  Service: Endoscopy;  Laterality: N/A;  . MITRAL VALVE ANNULOPLASTY  1996  . MITRAL VALVE REPAIR  1995  . PARTIAL GASTRECTOMY      SOCIAL HISTORY:   Social History   Tobacco Use  . Smoking status: Never Smoker  . Smokeless tobacco: Never Used  Substance Use Topics  . Alcohol use: Yes    Comment: socially, beer     FAMILY HISTORY:   Family History  Problem Relation Age of Onset  . Cancer Father         lung  . Colon cancer Neg Hx   . Prostate cancer Neg Hx     DRUG ALLERGIES:   Allergies  Allergen Reactions  . Atorvastatin Other (See Comments)    Other reaction(s): Unknown REACTION:muscular soreness  . Statins     Muscle aches on mult statins    REVIEW OF SYSTEMS:  Unable to provide review of systems at this time  MEDICATIONS AT HOME:   Prior to Admission medications   Not on File   Medication reconciliation process still undergoing. From last discharge was on amiodarone 200 mg daily, vitamin D, vitamin B-12, Inspra 25 mg daily, ferrous sulfate twice a day, lisinopril 2.5 mg daily, Toprol-XL 25 mg daily, Zofran, Protonix 40 mg daily, Flomax 0.4 mg daily  VITAL SIGNS:  Blood pressure (!) 100/56, pulse 84, temperature 98.7 F (37.1 C), temperature source Rectal, resp. rate 20, height 5\' 11"  (1.803 m), weight 78.8 kg, SpO2 100 %.  PHYSICAL EXAMINATION:  GENERAL:  85 y.o.-year-old patient lying in the bed intubated EYES: Pupils pinpoint.  Lipids normal. HEENT: Head atraumatic, normocephalic. Oropharynx and nasopharynx clear.  NECK:  Supple, positive jugular venous distention. No thyroid enlargement, no tenderness.  LUNGS: Decreased breath sounds bilateral, no wheezing, rales halfway up lung field. No use of accessory muscles of respiration.  CARDIOVASCULAR: S1, S2 normal. No murmurs, rubs, or gallops.  ABDOMEN: Soft, nontender,distended. Bowel sounds present. No organomegaly or mass.  EXTREMITIES: 2+ pedal edema, no cyanosis, or clubbing.  NEUROLOGIC: Intubated and sedated PSYCHIATRIC: The patient is intubated and sedated.  SKIN: No rash, lesion, or ulcer.   LABORATORY PANEL:   CBC Recent Labs  Lab Jan 16, 2019 1020  WBC 11.1*  HGB 8.1*  HCT 25.9*  PLT 536*   ------------------------------------------------------------------------------------------------------------------  Chemistries  Recent Labs  Lab 01-16-19 1020  NA 124*  K 5.8*  CL 90*  CO2 23  GLUCOSE  106*  BUN 58*  CREATININE 2.15*  CALCIUM 7.1*  AST 40  ALT 21  ALKPHOS 50  BILITOT 2.0*   ------------------------------------------------------------------------------------------------------------------  Cardiac Enzymes Recent Labs  Lab 01/16/2019 1020  TROPONINI 0.15*   ------------------------------------------------------------------------------------------------------------------  RADIOLOGY:  Ct Head Wo Contrast  Result Date: 16-Jan-2019 CLINICAL DATA:  Altered mental status.  Altered level consciousness EXAM: CT HEAD WITHOUT CONTRAST TECHNIQUE: Contiguous axial images were obtained from the base of the skull through the vertex without intravenous contrast. COMPARISON:  Head CT 03/09/2016 FINDINGS: Brain: No acute intracranial hemorrhage. No focal mass lesion. No CT evidence of acute infarction. No midline shift or mass effect. No hydrocephalus. Basilar cisterns are patent. There are periventricular and subcortical white matter hypodensities. Generalized cortical atrophy. Vascular: No hyperdense vessel or unexpected calcification. Skull: Normal. Negative for fracture or focal lesion. Sinuses/Orbits: Paranasal sinuses and mastoid air cells are clear. Orbits are clear. Other: Endotracheal to the oropharynx. IMPRESSION: 1. No acute intracranial findings. 2. Atrophy white matter microvascular disease. Electronically Signed   By: Helane Gunther.D.  On: 03-Jan-2019 11:49   Ct Chest W Contrast  Result Date: Jan 03, 2019 CLINICAL DATA:  85 year old male became unresponsive at clinic appointment today. Recent GI bleeding. Intubated, NG tube placement. EXAM: CT CHEST, ABDOMEN, AND PELVIS WITH CONTRAST TECHNIQUE: Multidetector CT imaging of the chest, abdomen and pelvis was performed following the standard protocol during bolus administration of intravenous contrast. CONTRAST:  153mL OMNIPAQUE IOHEXOL 300 MG/ML  SOLN COMPARISON:  Portable chest and abdomen earlier today. FINDINGS: CT CHEST  FINDINGS Cardiovascular: Cardiomegaly. No pericardial effusion. Calcified aortic atherosclerosis. Calcified coronary artery atherosclerosis. No thoracic aortic dissection or aneurysm. The central pulmonary arteries appear to be patent. The atelectatic lung is enhancing. Mediastinum/Nodes: Negative. No lymphadenopathy. Lungs/Pleura: Endotracheal tube in place, tip above the carina. Small volume layering fluid in the lower trachea and mainstem bronchi. Major airways are otherwise patent. Combined layering and loculated large left pleural effusion with simple fluid density. Severe compressive atelectasis of the left lower lobe and lingula. Some of the left upper lobe remains ventilated with mild to moderate upper lobe compressive atelectasis. Contralateral moderate size layering and loculated pleural effusion also with simple fluid density. Compressive atelectasis is most pronounced in the right upper lobe and basal segments of the right lower lobe. The right middle lobe is relatively spared. Musculoskeletal: Prior sternotomy. Mild T4 and T5 superior endplate compression appears nonacute. No acute osseous abnormality identified. CT ABDOMEN PELVIS FINDINGS Hepatobiliary: Surgically absent gallbladder. Negative liver. Pancreas: Negative. Spleen: Negative aside from small volume of adjacent free fluid. Adrenals/Urinary Tract: Normal adrenal glands. Bilateral renal enhancement is symmetric and normal. There is no renal contrast excretion on the delayed images. No hydronephrosis or hydroureter. Decompressed but severely thick-walled urinary bladder, up to 16 millimeters in thickness. Foley catheter and small volume gas within the bladder. No perivesical stranding identified. Stomach/Bowel: No dilated large or small bowel. Mild diverticulosis in the sigmoid colon. Previous gastrojejunostomy and distal gastrectomy. Enteric tube courses into the stomach and terminates in the gastric remnant. The cecum is on a lax mesentery.  The terminal ileum appears within normal limits. The appendix is not identified. No free air. Small volume free fluid in both gutters and adjacent to the spleen. Vascular/Lymphatic: Aortoiliac calcified atherosclerosis. Ectatic infrarenal abdominal aorta up to 27 millimeters diameter. Major arterial structures in the abdomen and pelvis are patent. There is a right common femoral approach venous catheter which terminates at the distal common iliac vein. Portal venous system is patent. No lymphadenopathy identified. Reproductive: Negative. Other: Small volume of layering free fluid in the pelvis superimposed on presacral stranding. Musculoskeletal: No acute osseous abnormality identified. Generalized body wall stranding suggesting edema and a degree of anasarca. IMPRESSION: 1. Large left and moderate right combined layering and loculated pleural effusions with bilateral compressive atelectasis. 2. Small volume of nonspecific free fluid in the abdomen and pelvis. Mild body wall edema raising the possibility of anasarca. 3. Absent renal contrast excretion on delayed images raising the possibility of renal insufficiency. 4. Circumferentially thick-walled urinary bladder. Consider UTI or other cause of cystitis. 5. Previous gastrectomy and gastrojejunostomy. No bowel obstruction or convincing focal bowel inflammation identified. 6. Cardiomegaly.  No pericardial effusion. 7. ET tube in good position. Enteric tube terminates in the gastric remnant. 8. Aortic Atherosclerosis (ICD10-I70.0). Ectatic abdominal aorta at risk for aneurysm development. Recommend followup by ultrasound in 5 years. This recommendation follows ACR consensus guidelines: White Paper of the ACR Incidental Findings Committee II on Vascular Findings. J Am Coll Radiol 2013; 10:789-794. Electronically Signed   By:  Genevie Ann M.D.   On: 15-Jan-2019 12:01   Ct Abdomen Pelvis W Contrast  Result Date: 01-15-19 CLINICAL DATA:  85 year old male became  unresponsive at clinic appointment today. Recent GI bleeding. Intubated, NG tube placement. EXAM: CT CHEST, ABDOMEN, AND PELVIS WITH CONTRAST TECHNIQUE: Multidetector CT imaging of the chest, abdomen and pelvis was performed following the standard protocol during bolus administration of intravenous contrast. CONTRAST:  13mL OMNIPAQUE IOHEXOL 300 MG/ML  SOLN COMPARISON:  Portable chest and abdomen earlier today. FINDINGS: CT CHEST FINDINGS Cardiovascular: Cardiomegaly. No pericardial effusion. Calcified aortic atherosclerosis. Calcified coronary artery atherosclerosis. No thoracic aortic dissection or aneurysm. The central pulmonary arteries appear to be patent. The atelectatic lung is enhancing. Mediastinum/Nodes: Negative. No lymphadenopathy. Lungs/Pleura: Endotracheal tube in place, tip above the carina. Small volume layering fluid in the lower trachea and mainstem bronchi. Major airways are otherwise patent. Combined layering and loculated large left pleural effusion with simple fluid density. Severe compressive atelectasis of the left lower lobe and lingula. Some of the left upper lobe remains ventilated with mild to moderate upper lobe compressive atelectasis. Contralateral moderate size layering and loculated pleural effusion also with simple fluid density. Compressive atelectasis is most pronounced in the right upper lobe and basal segments of the right lower lobe. The right middle lobe is relatively spared. Musculoskeletal: Prior sternotomy. Mild T4 and T5 superior endplate compression appears nonacute. No acute osseous abnormality identified. CT ABDOMEN PELVIS FINDINGS Hepatobiliary: Surgically absent gallbladder. Negative liver. Pancreas: Negative. Spleen: Negative aside from small volume of adjacent free fluid. Adrenals/Urinary Tract: Normal adrenal glands. Bilateral renal enhancement is symmetric and normal. There is no renal contrast excretion on the delayed images. No hydronephrosis or hydroureter.  Decompressed but severely thick-walled urinary bladder, up to 16 millimeters in thickness. Foley catheter and small volume gas within the bladder. No perivesical stranding identified. Stomach/Bowel: No dilated large or small bowel. Mild diverticulosis in the sigmoid colon. Previous gastrojejunostomy and distal gastrectomy. Enteric tube courses into the stomach and terminates in the gastric remnant. The cecum is on a lax mesentery. The terminal ileum appears within normal limits. The appendix is not identified. No free air. Small volume free fluid in both gutters and adjacent to the spleen. Vascular/Lymphatic: Aortoiliac calcified atherosclerosis. Ectatic infrarenal abdominal aorta up to 27 millimeters diameter. Major arterial structures in the abdomen and pelvis are patent. There is a right common femoral approach venous catheter which terminates at the distal common iliac vein. Portal venous system is patent. No lymphadenopathy identified. Reproductive: Negative. Other: Small volume of layering free fluid in the pelvis superimposed on presacral stranding. Musculoskeletal: No acute osseous abnormality identified. Generalized body wall stranding suggesting edema and a degree of anasarca. IMPRESSION: 1. Large left and moderate right combined layering and loculated pleural effusions with bilateral compressive atelectasis. 2. Small volume of nonspecific free fluid in the abdomen and pelvis. Mild body wall edema raising the possibility of anasarca. 3. Absent renal contrast excretion on delayed images raising the possibility of renal insufficiency. 4. Circumferentially thick-walled urinary bladder. Consider UTI or other cause of cystitis. 5. Previous gastrectomy and gastrojejunostomy. No bowel obstruction or convincing focal bowel inflammation identified. 6. Cardiomegaly.  No pericardial effusion. 7. ET tube in good position. Enteric tube terminates in the gastric remnant. 8. Aortic Atherosclerosis (ICD10-I70.0). Ectatic  abdominal aorta at risk for aneurysm development. Recommend followup by ultrasound in 5 years. This recommendation follows ACR consensus guidelines: White Paper of the ACR Incidental Findings Committee II on Vascular Findings. J Am Coll  Radiol 2013; 75:643-329. Electronically Signed   By: Genevie Ann M.D.   On: 01/20/19 12:01   Dg Chest Portable 1 View  Addendum Date: 2019-01-20   ADDENDUM REPORT: 01-20-19 11:03 ADDENDUM: Study discussed by telephone with Dr. Merlyn Lot on 2019-01-20 at 11:02 . Electronically Signed   By: Genevie Ann M.D.   On: 20-Jan-2019 11:03   Result Date: 01-20-2019 CLINICAL DATA:  85 year old male became unresponsive at clinic appointment. Intubated and NG tube placement. EXAM: PORTABLE CHEST 1 VIEW COMPARISON:  Portable chest 03/09/2016. FINDINGS: Portable AP supine view at 1036 hours. Endotracheal tube tip in good position between the level the clavicles and carina. Enteric tube courses to the abdomen, tip not included. Resuscitation pads overlie the left chest. There is a large abnormal rounded area of opacity occupying the left hemithorax except for the medial lung apex. The mediastinal contour appears abnormally widened. There is similar rounded and confluent opacity in the right upper lung. No definite pneumothorax. Paucity of bowel gas in the upper abdomen. No acute osseous abnormality identified. IMPRESSION: 1. ET tube in good position. Enteric tube courses to the abdomen, tip not included. 2. Abnormal rounded subtotal opacity in the left lung with similar confluent right upper lung opacity and abnormal mediastinal widening. These findings are nonspecific and might reflect sequelae of trauma or tumor. Recommend Chest CT (IV contrast) to further characterize. Electronically Signed: By: Genevie Ann M.D. On: 01-20-2019 10:57   Dg Abd Portable 1 View  Result Date: 01-20-2019 CLINICAL DATA:  85 year old male became unresponsive at a clinic appointment. EXAM: PORTABLE ABDOMEN - 1 VIEW  COMPARISON:  Portable chest today reported separately. FINDINGS: Portable AP supine view at 1026 hours. Enteric tube courses to the abdomen and the side hole is at the level of the proximal stomach. There is gas in nondilated small and large bowel loops which are confined to the abdomen, no evidence of bowel herniation into the left chest on this image. Right upper quadrant cholecystectomy clips. No acute osseous abnormality identified. IMPRESSION: 1. Enteric tube in good position, side hole at the level of the proximal stomach. 2. Gas-filled bowel loops in the abdomen might indicate ileus. No evidence of bowel herniation into the left chest. Study discussed by telephone with Dr. Merlyn Lot on 2019/01/20 at 11:02 . Electronically Signed   By: Genevie Ann M.D.   On: January 20, 2019 11:03    EKG:   Sinus 60 bpm intraventricular conduction delay  IMPRESSION AND PLAN:   1.  Acute respiratory arrest.  Patient intubated in the emergency room.  Continue ventilator support.  Case discussed with critical care specialist. 2.  Bilateral pleural effusions.  ER physician spoke with interventional radiology for a thoracentesis. 3.  Hypotension.  Currently on Levophed drip to maintain blood pressure.  Hold all antihypertensives at this time.  Send off procalcitonin influenza and respiratory viral panel.  Patient receiving a fluid bolus also.  Empiric antibiotics with left shift on differential with some bands.  Follow-up blood cultures. 4.  Acute systolic congestive heart failure with cardiomyopathy.  Last EF 20 to 25%.  Patient also has anasarca and low albumin.  May be developing cardiorenal syndrome.  With hypotension I am unable to diurese currently.  Will consult cardiology. 5.  Acute kidney injury.  Receiving a fluid bolus currently 6.  Hyperkalemia.  We will give IV calcium, IV bicarb insulin and D50.  Kayexalate enema 7.  Hyponatremia.  Likely with cardiorenal syndrome.  Patient sodium looks like it has  been on  the lower side to start with anyway. 8.  Anemia recent GI bleed.  DVT prophylaxis with teds and SCDs.  Send off iron studies   All the records are reviewed and case discussed with ED provider. Management plans discussed with the patient, family (granddaughter on the phone) and they are in agreement.  CODE STATUS: Full code  TOTAL TIME TAKING CARE OF THIS PATIENT: 50 minutes total and 30 minutes at the bedside.  Patient is critically ill and high risk for cardiopulmonary arrest.  I will treat life-threatening hyperkalemia with IV calcium IV sodium bicarb insulin and D50 and Kayexalate enema.  Case discussed with critical care specialist granddaughter and message to cardiology.   Loletha Grayer M.D on 2019/01/09 at 12:35 PM  Between 7am to 6pm - Pager - [phone]  After 6pm call admission pager [phone]  [facility]  [phone]  CC: Primary care physician; Tonia Ghent, MD

## 2019-01-03 NOTE — ED Notes (Signed)
X-ray at bedside

## 2019-01-03 NOTE — ED Notes (Signed)
Preparing to intubate pt. Dr Quentin Cornwall at bedside. RT in room

## 2019-01-03 NOTE — ED Triage Notes (Signed)
Pads and zoll placed on pt immediately on arrival. From [facility] GI clinic, was there for FU and went unresponsive. No family with pt. Agonal breathing on arrival to ED.  In bed, BVM for respirations

## 2019-01-03 NOTE — ED Notes (Signed)
ED TO INPATIENT HANDOFF REPORT  ED Nurse Name and Phone #: Mateo Flow [phone]  S Name/Age/Gender [name] 85 y.o. male Room/Bed: ED09A/ED09A  Code Status   Code Status: Prior  Home/SNF/Other Rehab   Triage Complete: Triage complete  Chief Complaint unresponsive  Triage Note Pads and zoll placed on pt immediately on arrival. From [facility], was there for FU and went unresponsive. No family with pt. Agonal breathing on arrival to ED.  In bed, BVM for respirations   Allergies Allergies  Allergen Reactions  . Atorvastatin Other (See Comments)    Other reaction(s): Unknown REACTION:muscular soreness  . Statins     Muscle aches on mult statins    Level of Care/Admitting Diagnosis ED Disposition    ED Disposition Condition [place]: [place] [[id number]]  Level of Care: ICU [6]  Diagnosis: Acute hypoxemic respiratory failure [facility]) [[id number]]  Admitting Physician: Loletha Grayer [[id number]]  Attending Physician: Loletha Grayer [phone]  Estimated length of stay: past midnight tomorrow  Certification:: I certify this patient will need inpatient services for at least 2 midnights  PT Class (Do Not Modify): Inpatient [101]  PT Acc Code (Do Not Modify): Private [1]       B Medical/Surgery History Past Medical History:  Diagnosis Date  . Blind right eye   . Diaphragmatic hernia without mention of obstruction or gangrene   . Diverticulosis of colon (without mention of hemorrhage)   . Dizziness and giddiness   . Esophageal reflux   . Gastritis 12/18/08   EGD, 3 clips remaining (Dr. Vira Agar)  . Hypertension   . Iron deficiency anemia    a.  Secondary to recurrent GI bleeding  . Macular degeneration   . Nonischemic cardiomyopathy (HCC)    a.  TTE and 3/15: EF 35 to 40%, mild left ear, mitral valve repair with mild to moderate MR, mild to moderate TR, PASP 48; b.  TTE 3/18: EF 25 to 30%, not technically sufficient to  allow for LV diastolic function, mild left ear possibly underestimated secondary to degree of CM, moderate AI calcified mitral annulus, moderate MR mod to sev dilated LA mod dilated RA moderate TR PASP 71  . Persistent atrial fibrillation    a.  Status post multiple cardioversions; b.  Previously on Coumadin though held secondary to recurrent GI bleed  . Personal history of peptic ulcer disease   . Pure hypercholesterolemia   . Sprain of neck   . Unspecified hemorrhoids without mention of complication   . Unspecified sleep apnea   . Upper GI bleed 7/15-7/20/09   a.  Recurrent GI bleed dating back to at least 2009 with subsequent bleed in 2010, multiple GI bleeds in 01/2018   Past Surgical History:  Procedure Laterality Date  . APPENDECTOMY    . CARDIAC CATHETERIZATION  1996   [facility]  . CARDIAC CATHETERIZATION    . CARDIAC CATHETERIZATION    . CARDIOVERSION  12/17/2011   Procedure: CARDIOVERSION;  Surgeon: Hillary Bow, MD;  Location: [place];  Service: Cardiovascular;  Laterality: N/A;  . CATARACT EXTRACTION  10/13/08   left  . CHOLECYSTECTOMY    . ESOPHAGOGASTRODUODENOSCOPY Left 01/17/2018   Procedure: ESOPHAGOGASTRODUODENOSCOPY (EGD);  Surgeon: Virgel Manifold, MD;  Location: [facility] ENDOSCOPY;  Service: Endoscopy;  Laterality: Left;  . ESOPHAGOGASTRODUODENOSCOPY N/A 12/07/2018   Procedure: ESOPHAGOGASTRODUODENOSCOPY (EGD);  Surgeon: Toledo, Benay Pike, MD;  Location: [facility] ENDOSCOPY;  Service: Gastroenterology;  Laterality: N/A;  . ESOPHAGOGASTRODUODENOSCOPY  N/A 12/10/2018   Procedure: ESOPHAGOGASTRODUODENOSCOPY (EGD);  Surgeon: Virgel Manifold, MD;  Location[facility] ENDOSCOPY;  Service: Endoscopy;  Laterality: N/A;  . ESOPHAGOGASTRODUODENOSCOPY (EGD) WITH PROPOFOL N/A 01/04/2018   Procedure: ESOPHAGOGASTRODUODENOSCOPY (EGD) WITH PROPOFOL;  Surgeon: [name], MD;  Location: [facility] ENDOSCOPY;  Service: Endoscopy;  Laterality: N/A;  . MITRAL VALVE ANNULOPLASTY  1996  . MITRAL VALVE REPAIR   1995  . PARTIAL GASTRECTOMY       A IV Location/Drains/Wounds Patient Lines/Drains/Airways Status   Active Line/Drains/Airways    Name:   Placement date:   Placement time:   Site:   Days:   Peripheral IV Jan 08, 2019 Left Antecubital   01/08/2019    0952    Antecubital   less than 1   CVC Triple Lumen 01/08/2019 Right Femoral   01/08/2019    1023     less than 1   NG/OG Tube Orogastric 16 Fr. Right mouth Xray;Aucultation   01-08-2019    1028    Right mouth   less than 1   Urethral Catheter val rn Latex 14 Fr.   2019/01/08    1047    Latex   less than 1   Airway 7.5 mm   01-08-19    1003     less than 1          Intake/Output Last 24 hours No intake or output data in the 24 hours ending 01/08/2019 1239  Labs/Imaging Results for orders placed or performed during the hospital encounter of 08-Jan-2019 (from the past 48 hour(s))  Glucose, capillary     Status: None   Collection Time: January 08, 2019  9:54 AM  Result Value Ref Range   Glucose-Capillary 88 70 - 99 mg/dL  CBC with Differential     Status: Abnormal   Collection Time: 01-08-19 10:20 AM  Result Value Ref Range   WBC 11.1 (H) 4.0 - 10.5 K/uL   RBC 3.17 (L) 4.22 - 5.81 MIL/uL   Hemoglobin 8.1 (L) 13.0 - 17.0 g/dL   HCT 25.9 (L) 39.0 - 52.0 %   MCV 81.7 80.0 - 100.0 fL   MCH 25.6 (L) 26.0 - 34.0 pg   MCHC 31.3 30.0 - 36.0 g/dL   RDW 18.6 (H) 11.5 - 15.5 %   Platelets 536 (H) 150 - 400 K/uL   nRBC 1.6 (H) 0.0 - 0.2 %   Neutrophils Relative % 83 %   Neutro Abs 9.2 (H) 1.7 - 7.7 K/uL   Lymphocytes Relative 7 %   Lymphs Abs 0.7 0.7 - 4.0 K/uL   Monocytes Relative 8 %   Monocytes Absolute 0.9 0.1 - 1.0 K/uL   Eosinophils Relative 0 %   Eosinophils Absolute 0.0 0.0 - 0.5 K/uL   Basophils Relative 0 %   Basophils Absolute 0.0 0.0 - 0.1 K/uL   WBC Morphology MILD LEFT SHIFT (1-5% METAS, OCC MYELO, OCC BANDS)    Smear Review PLATELET CLUMPS NOTED ON SMEAR, UNABLE TO ESTIMATE    Immature Granulocytes 2 %   Abs Immature Granulocytes 0.23 (H)  0.00 - 0.07 K/uL   Burr Cells PRESENT    Target Cells PRESENT     Comment: Performed at [facility], [address] [place], [place] [address]  Comprehensive metabolic panel     Status: Abnormal   Collection Time: January 08, 2019 10:20 AM  Result Value Ref Range   Sodium 124 (L) 135 - 145 mmol/L   Potassium 5.8 (H) 3.5 - 5.1 mmol/L   Chloride 90 (  L) 98 - 111 mmol/L   CO2 23 22 - 32 mmol/L   Glucose, Bld 106 (H) 70 - 99 mg/dL   BUN 58 (H) 8 - 23 mg/dL   Creatinine, Ser 2.15 (H) 0.61 - 1.24 mg/dL   Calcium 7.1 (L) 8.9 - 10.3 mg/dL   Total Protein 4.6 (L) 6.5 - 8.1 g/dL   Albumin 1.5 (L) 3.5 - 5.0 g/dL   AST 40 15 - 41 U/L   ALT 21 0 - 44 U/L   Alkaline Phosphatase 50 38 - 126 U/L   Total Bilirubin 2.0 (H) 0.3 - 1.2 mg/dL   GFR calc non Af Amer 27 (L) >60 mL/min   GFR calc Af Amer 32 (L) >60 mL/min   Anion gap 11 5 - 15    Comment: Performed at [facility], [address] [place], [place] [address]  Troponin I - ONCE - STAT     Status: Abnormal   Collection Time: 11-Jan-2019 10:20 AM  Result Value Ref Range   Troponin I 0.15 (HH) <0.03 ng/mL    Comment: CRITICAL RESULT CALLED TO, READ BACK BY AND VERIFIED WITH Jakub Debold AT 1205 01-11-19.PMF Performed at [facility], [address] [place], [place] [address]   Lactic acid, plasma     Status: Abnormal   Collection Time: 01-11-2019 10:20 AM  Result Value Ref Range   Lactic Acid, Venous 3.2 (HH) 0.5 - 1.9 mmol/L    Comment: CRITICAL RESULT CALLED TO, READ BACK BY AND VERIFIED WITH Champayne Kocian AT 1205 January 11, 2019.PMF Performed at [facility], [address] [place], [place] [address]   Protime-INR     Status: Abnormal   Collection Time: 01/11/2019 10:20 AM  Result Value Ref Range   Prothrombin Time 15.9 (H) 11.4 - 15.2 seconds   INR 1.3 (H) 0.8 - 1.2    Comment: (NOTE) INR goal varies based on device and disease states. Performed at [facility], [address]  [place], [place] [address]   APTT     Status: None   Collection Time: 01-11-2019 10:20 AM  Result Value Ref Range   aPTT 36 24 - 36 seconds    Comment: Performed at [facility], [address] [place], [place] [address]  Type and screen [place]     Status: None   Collection Time: 2019/01/11 10:22 AM  Result Value Ref Range   ABO/RH(D) O POS    Antibody Screen NEG    Sample Expiration      12/25/2018 Performed at [facility], [address] [place], [place] [address]   Urinalysis, Complete w Microscopic     Status: Abnormal   Collection Time: 2019/01/11 10:39 AM  Result Value Ref Range   Color, Urine AMBER (A) YELLOW    Comment: BIOCHEMICALS MAY BE AFFECTED BY COLOR   APPearance CLEAR (A) CLEAR   Specific Gravity, Urine 1.020 1.005 - 1.030   pH 5.0 5.0 - 8.0   Glucose, UA NEGATIVE NEGATIVE mg/dL   Hgb urine dipstick NEGATIVE NEGATIVE   Bilirubin Urine NEGATIVE NEGATIVE   Ketones, ur NEGATIVE NEGATIVE mg/dL   Protein, ur NEGATIVE NEGATIVE mg/dL   Nitrite NEGATIVE NEGATIVE   Leukocytes,Ua NEGATIVE NEGATIVE   RBC / HPF 0-5 0 - 5 RBC/hpf   WBC, UA 0-5 0 - 5 WBC/hpf   Bacteria, UA NONE SEEN NONE SEEN   Squamous Epithelial / LPF NONE SEEN 0 - 5   Mucus PRESENT  Hyaline Casts, UA PRESENT     Comment: Performed at [facility], [address] [place], [place] [address]  Urine Drug Screen, Qualitative [facility] only)     Status: None   Collection Time: 01-14-2019 10:39 AM  Result Value Ref Range   Tricyclic, Ur Screen NONE DETECTED NONE DETECTED   Amphetamines, Ur Screen NONE DETECTED NONE DETECTED   MDMA (Ecstasy)Ur Screen NONE DETECTED NONE DETECTED   Cocaine Metabolite,Ur [place] NONE DETECTED NONE DETECTED   Opiate, Ur Screen NONE DETECTED NONE DETECTED   Phencyclidine (PCP) Ur S NONE DETECTED NONE DETECTED   Cannabinoid 50 Ng, Ur [place] NONE DETECTED NONE DETECTED   Barbiturates, Ur Screen NONE DETECTED NONE DETECTED   Benzodiazepine, Ur  Scrn NONE DETECTED NONE DETECTED   Methadone Scn, Ur NONE DETECTED NONE DETECTED    Comment: (NOTE) Tricyclics + metabolites, urine    Cutoff 1000 ng/mL Amphetamines + metabolites, urine  Cutoff 1000 ng/mL MDMA (Ecstasy), urine              Cutoff 500 ng/mL Cocaine Metabolite, urine          Cutoff 300 ng/mL Opiate + metabolites, urine        Cutoff 300 ng/mL Phencyclidine (PCP), urine         Cutoff 25 ng/mL Cannabinoid, urine                 Cutoff 50 ng/mL Barbiturates + metabolites, urine  Cutoff 200 ng/mL Benzodiazepine, urine              Cutoff 200 ng/mL Methadone, urine                   Cutoff 300 ng/mL The urine drug screen provides only a preliminary, unconfirmed analytical test result and should not be used for non-medical purposes. Clinical consideration and professional judgment should be applied to any positive drug screen result due to possible interfering substances. A more specific alternate chemical method must be used in order to obtain a confirmed analytical result. Gas chromatography / mass spectrometry (GC/MS) is the preferred confirmat ory method. Performed at [facility], [address] [place], [place] [address]   Blood gas, arterial ([facility] & [facility] ONLY)     Status: Abnormal   Collection Time: 01/14/19 11:48 AM  Result Value Ref Range   FIO2 50.00    Delivery systems VENTILATOR    Mode ASSIST CONTROL    VT 500 mL   LHR 18 resp/min   Peep/cpap 5.0 cm H20   pH, Arterial 7.42 7.350 - 7.450   pCO2 arterial 37 32.0 - 48.0 mmHg   pO2, Arterial 126 (H) 83.0 - 108.0 mmHg   Bicarbonate 24.0 20.0 - 28.0 mmol/L   Acid-base deficit 0.2 0.0 - 2.0 mmol/L   O2 Saturation 98.9 %   Patient temperature 37.0    Collection site RIGHT RADIAL    Sample type ARTERIAL DRAW    Allens test (pass/fail) PASS PASS    Comment: Performed at [facility], [address] [place],  [address]   Ct Head Wo Contrast  Result Date: 2019-01-14 CLINICAL DATA:   Altered mental status.  Altered level consciousness EXAM: CT HEAD WITHOUT CONTRAST TECHNIQUE: Contiguous axial images were obtained from the base of the skull through the vertex without intravenous contrast. COMPARISON:  Head CT 03/09/2016 FINDINGS: Brain: No acute intracranial hemorrhage. No focal mass lesion. No CT evidence of acute infarction. No midline shift or mass effect. No  hydrocephalus. Basilar cisterns are patent. There are periventricular and subcortical white matter hypodensities. Generalized cortical atrophy. Vascular: No hyperdense vessel or unexpected calcification. Skull: Normal. Negative for fracture or focal lesion. Sinuses/Orbits: Paranasal sinuses and mastoid air cells are clear. Orbits are clear. Other: Endotracheal to the oropharynx. IMPRESSION: 1. No acute intracranial findings. 2. Atrophy white matter microvascular disease. Electronically Signed   By: Suzy Bouchard M.D.   On: 01-09-2019 11:49   Ct Chest W Contrast  Result Date: January 09, 2019 CLINICAL DATA:  85 year old male became unresponsive at clinic appointment today. Recent GI bleeding. Intubated, NG tube placement. EXAM: CT CHEST, ABDOMEN, AND PELVIS WITH CONTRAST TECHNIQUE: Multidetector CT imaging of the chest, abdomen and pelvis was performed following the standard protocol during bolus administration of intravenous contrast. CONTRAST:  120mL OMNIPAQUE IOHEXOL 300 MG/ML  SOLN COMPARISON:  Portable chest and abdomen earlier today. FINDINGS: CT CHEST FINDINGS Cardiovascular: Cardiomegaly. No pericardial effusion. Calcified aortic atherosclerosis. Calcified coronary artery atherosclerosis. No thoracic aortic dissection or aneurysm. The central pulmonary arteries appear to be patent. The atelectatic lung is enhancing. Mediastinum/Nodes: Negative. No lymphadenopathy. Lungs/Pleura: Endotracheal tube in place, tip above the carina. Small volume layering fluid in the lower trachea and mainstem bronchi. Major airways are otherwise  patent. Combined layering and loculated large left pleural effusion with simple fluid density. Severe compressive atelectasis of the left lower lobe and lingula. Some of the left upper lobe remains ventilated with mild to moderate upper lobe compressive atelectasis. Contralateral moderate size layering and loculated pleural effusion also with simple fluid density. Compressive atelectasis is most pronounced in the right upper lobe and basal segments of the right lower lobe. The right middle lobe is relatively spared. Musculoskeletal: Prior sternotomy. Mild T4 and T5 superior endplate compression appears nonacute. No acute osseous abnormality identified. CT ABDOMEN PELVIS FINDINGS Hepatobiliary: Surgically absent gallbladder. Negative liver. Pancreas: Negative. Spleen: Negative aside from small volume of adjacent free fluid. Adrenals/Urinary Tract: Normal adrenal glands. Bilateral renal enhancement is symmetric and normal. There is no renal contrast excretion on the delayed images. No hydronephrosis or hydroureter. Decompressed but severely thick-walled urinary bladder, up to 16 millimeters in thickness. Foley catheter and small volume gas within the bladder. No perivesical stranding identified. Stomach/Bowel: No dilated large or small bowel. Mild diverticulosis in the sigmoid colon. Previous gastrojejunostomy and distal gastrectomy. Enteric tube courses into the stomach and terminates in the gastric remnant. The cecum is on a lax mesentery. The terminal ileum appears within normal limits. The appendix is not identified. No free air. Small volume free fluid in both gutters and adjacent to the spleen. Vascular/Lymphatic: Aortoiliac calcified atherosclerosis. Ectatic infrarenal abdominal aorta up to 27 millimeters diameter. Major arterial structures in the abdomen and pelvis are patent. There is a right common femoral approach venous catheter which terminates at the distal common iliac vein. Portal venous system is  patent. No lymphadenopathy identified. Reproductive: Negative. Other: Small volume of layering free fluid in the pelvis superimposed on presacral stranding. Musculoskeletal: No acute osseous abnormality identified. Generalized body wall stranding suggesting edema and a degree of anasarca. IMPRESSION: 1. Large left and moderate right combined layering and loculated pleural effusions with bilateral compressive atelectasis. 2. Small volume of nonspecific free fluid in the abdomen and pelvis. Mild body wall edema raising the possibility of anasarca. 3. Absent renal contrast excretion on delayed images raising the possibility of renal insufficiency. 4. Circumferentially thick-walled urinary bladder. Consider UTI or other cause of cystitis. 5. Previous gastrectomy and gastrojejunostomy. No bowel obstruction or convincing  focal bowel inflammation identified. 6. Cardiomegaly.  No pericardial effusion. 7. ET tube in good position. Enteric tube terminates in the gastric remnant. 8. Aortic Atherosclerosis (ICD10-I70.0). Ectatic abdominal aorta at risk for aneurysm development. Recommend followup by ultrasound in 5 years. This recommendation follows ACR consensus guidelines: White Paper of the ACR Incidental Findings Committee II on Vascular Findings. J Am Coll Radiol 2013; 10:789-794. Electronically Signed   By: Genevie Ann M.D.   On: 25-Dec-2018 12:01   Ct Abdomen Pelvis W Contrast  Result Date: 2018/12/25 CLINICAL DATA:  85 year old male became unresponsive at clinic appointment today. Recent GI bleeding. Intubated, NG tube placement. EXAM: CT CHEST, ABDOMEN, AND PELVIS WITH CONTRAST TECHNIQUE: Multidetector CT imaging of the chest, abdomen and pelvis was performed following the standard protocol during bolus administration of intravenous contrast. CONTRAST:  137mL OMNIPAQUE IOHEXOL 300 MG/ML  SOLN COMPARISON:  Portable chest and abdomen earlier today. FINDINGS: CT CHEST FINDINGS Cardiovascular: Cardiomegaly. No pericardial  effusion. Calcified aortic atherosclerosis. Calcified coronary artery atherosclerosis. No thoracic aortic dissection or aneurysm. The central pulmonary arteries appear to be patent. The atelectatic lung is enhancing. Mediastinum/Nodes: Negative. No lymphadenopathy. Lungs/Pleura: Endotracheal tube in place, tip above the carina. Small volume layering fluid in the lower trachea and mainstem bronchi. Major airways are otherwise patent. Combined layering and loculated large left pleural effusion with simple fluid density. Severe compressive atelectasis of the left lower lobe and lingula. Some of the left upper lobe remains ventilated with mild to moderate upper lobe compressive atelectasis. Contralateral moderate size layering and loculated pleural effusion also with simple fluid density. Compressive atelectasis is most pronounced in the right upper lobe and basal segments of the right lower lobe. The right middle lobe is relatively spared. Musculoskeletal: Prior sternotomy. Mild T4 and T5 superior endplate compression appears nonacute. No acute osseous abnormality identified. CT ABDOMEN PELVIS FINDINGS Hepatobiliary: Surgically absent gallbladder. Negative liver. Pancreas: Negative. Spleen: Negative aside from small volume of adjacent free fluid. Adrenals/Urinary Tract: Normal adrenal glands. Bilateral renal enhancement is symmetric and normal. There is no renal contrast excretion on the delayed images. No hydronephrosis or hydroureter. Decompressed but severely thick-walled urinary bladder, up to 16 millimeters in thickness. Foley catheter and small volume gas within the bladder. No perivesical stranding identified. Stomach/Bowel: No dilated large or small bowel. Mild diverticulosis in the sigmoid colon. Previous gastrojejunostomy and distal gastrectomy. Enteric tube courses into the stomach and terminates in the gastric remnant. The cecum is on a lax mesentery. The terminal ileum appears within normal limits. The  appendix is not identified. No free air. Small volume free fluid in both gutters and adjacent to the spleen. Vascular/Lymphatic: Aortoiliac calcified atherosclerosis. Ectatic infrarenal abdominal aorta up to 27 millimeters diameter. Major arterial structures in the abdomen and pelvis are patent. There is a right common femoral approach venous catheter which terminates at the distal common iliac vein. Portal venous system is patent. No lymphadenopathy identified. Reproductive: Negative. Other: Small volume of layering free fluid in the pelvis superimposed on presacral stranding. Musculoskeletal: No acute osseous abnormality identified. Generalized body wall stranding suggesting edema and a degree of anasarca. IMPRESSION: 1. Large left and moderate right combined layering and loculated pleural effusions with bilateral compressive atelectasis. 2. Small volume of nonspecific free fluid in the abdomen and pelvis. Mild body wall edema raising the possibility of anasarca. 3. Absent renal contrast excretion on delayed images raising the possibility of renal insufficiency. 4. Circumferentially thick-walled urinary bladder. Consider UTI or other cause of cystitis. 5. Previous gastrectomy  and gastrojejunostomy. No bowel obstruction or convincing focal bowel inflammation identified. 6. Cardiomegaly.  No pericardial effusion. 7. ET tube in good position. Enteric tube terminates in the gastric remnant. 8. Aortic Atherosclerosis (ICD10-I70.0). Ectatic abdominal aorta at risk for aneurysm development. Recommend followup by ultrasound in 5 years. This recommendation follows ACR consensus guidelines: White Paper of the ACR Incidental Findings Committee II on Vascular Findings. J Am Coll Radiol 2013; 10:789-794. Electronically Signed   By: Genevie Ann M.D.   On: 01-18-2019 12:01   Dg Chest Portable 1 View  Addendum Date: 18-Jan-2019   ADDENDUM REPORT: 01-18-2019 11:03 ADDENDUM: Study discussed by telephone with Dr. Merlyn Lot on  18-Jan-2019 at 11:02 . Electronically Signed   By: Genevie Ann M.D.   On: 01/18/2019 11:03   Result Date: 01-18-2019 CLINICAL DATA:  85 year old male became unresponsive at clinic appointment. Intubated and NG tube placement. EXAM: PORTABLE CHEST 1 VIEW COMPARISON:  Portable chest 03/09/2016. FINDINGS: Portable AP supine view at 1036 hours. Endotracheal tube tip in good position between the level the clavicles and carina. Enteric tube courses to the abdomen, tip not included. Resuscitation pads overlie the left chest. There is a large abnormal rounded area of opacity occupying the left hemithorax except for the medial lung apex. The mediastinal contour appears abnormally widened. There is similar rounded and confluent opacity in the right upper lung. No definite pneumothorax. Paucity of bowel gas in the upper abdomen. No acute osseous abnormality identified. IMPRESSION: 1. ET tube in good position. Enteric tube courses to the abdomen, tip not included. 2. Abnormal rounded subtotal opacity in the left lung with similar confluent right upper lung opacity and abnormal mediastinal widening. These findings are nonspecific and might reflect sequelae of trauma or tumor. Recommend Chest CT (IV contrast) to further characterize. Electronically Signed: By: Genevie Ann M.D. On: 2019/01/18 10:57   Dg Abd Portable 1 View  Result Date: 01-18-2019 CLINICAL DATA:  85 year old male became unresponsive at a clinic appointment. EXAM: PORTABLE ABDOMEN - 1 VIEW COMPARISON:  Portable chest today reported separately. FINDINGS: Portable AP supine view at 1026 hours. Enteric tube courses to the abdomen and the side hole is at the level of the proximal stomach. There is gas in nondilated small and large bowel loops which are confined to the abdomen, no evidence of bowel herniation into the left chest on this image. Right upper quadrant cholecystectomy clips. No acute osseous abnormality identified. IMPRESSION: 1. Enteric tube in good position,  side hole at the level of the proximal stomach. 2. Gas-filled bowel loops in the abdomen might indicate ileus. No evidence of bowel herniation into the left chest. Study discussed by telephone with Dr. Merlyn Lot on Jan 18, 2019 at 11:02 . Electronically Signed   By: Genevie Ann M.D.   On: 2019-01-18 11:03    Pending Labs Unresulted Labs (From admission, onward)    Start     Ordered   12/23/18 0500  Procalcitonin  Daily,   STAT     2019-01-18 1227   01-18-2019 1228  Procalcitonin - Baseline  Add-on,   AD     Jan 18, 2019 1227   January 18, 2019 1226  Respiratory Panel by PCR  (Respiratory virus panel with precautions)  Once,   STAT     Jan 18, 2019 1225   01/18/2019 1226  Influenza panel by PCR (type A & B)  (Influenza PCR Panel)  Once,   STAT     18-Jan-2019 1225   Jan 18, 2019 1021  Blood culture (routine x 2)  BLOOD CULTURE X 2,   STAT     01-21-19 1020   01-21-2019 1020  Lactic acid, plasma  Now then every 2 hours,   STAT     January 21, 2019 1020          Vitals/Pain Today's Vitals   01-21-19 1154 21-Jan-2019 1156 01-21-19 1158 01/21/2019 1212  BP: (!) 101/56 (!) 98/55 (!) 95/54 (!) 100/56  Pulse: 85 85 84 84  Resp: (!) 6 (!) 23 20 20   Temp:      TempSrc:      SpO2: 100% 100% 100% 100%  Weight:      Height:      PainSc:        Isolation Precautions Droplet precaution  Medications Medications  norepinephrine (LEVOPHED) 4 mg in dextrose 5 % 250 mL (0.016 mg/mL) infusion (12 mcg/min Intravenous Rate/Dose Change 21-Jan-2019 1201)  fentaNYL 2520mcg in NS 236mL (52mcg/ml) infusion-PREMIX (25 mcg/hr Intravenous New Bag/Given January 21, 2019 1148)  vancomycin (VANCOCIN) IVPB 1000 mg/200 mL premix (1,000 mg Intravenous New Bag/Given 2019-01-21 1202)  albumin human 25 % solution 50 g (has no administration in time range)  vancomycin (VANCOCIN) 500 mg in sodium chloride 0.9 % 100 mL IVPB (has no administration in time range)  etomidate (AMIDATE) injection 20 mg (20 mg Intravenous Given by Other 2019/01/21 1002)  rocuronium (ZEMURON)  injection 20 mg (20 mg Intravenous Given by Other 01-21-2019 1003)  sodium chloride 0.9 % bolus 1,000 mL (0 mLs Intravenous Stopped Jan 21, 2019 1040)  sodium bicarbonate injection 50 mEq (50 mEq Intravenous Given 21-Jan-2019 1055)  iohexol (OMNIPAQUE) 300 MG/ML solution 100 mL (100 mLs Intravenous Contrast Given January 21, 2019 1120)  ceFEPIme (MAXIPIME) 2 g in sodium chloride 0.9 % 100 mL IVPB (0 g Intravenous Stopped 01-21-2019 1228)  sodium chloride 0.9 % bolus 500 mL (500 mLs Intravenous New Bag/Given 2019-01-21 1211)    Mobility unknown High fall risk   Focused Assessments Cardiac Assessment Handoff:  Cardiac Rhythm: Normal sinus rhythm Lab Results  Component Value Date   TROPONINI 0.15 (HH) January 21, 2019   Lab Results  Component Value Date   DDIMER (H) 03/15/2007    1.28        AT THE INHOUSE ESTABLISHED CUTOFF VALUE OF 0.48 ug/mL FEU, THIS ASSAY HAS BEEN DOCUMENTED IN THE LITERATURE TO HAVE   Does the Patient currently have chest pain? NA , Neuro Assessment Handoff:  Swallow screen pass? NA Cardiac Rhythm: Normal sinus rhythm       Neuro Assessment: Exceptions to WDL Neuro Checks:      Last Documented NIHSS Modified Score:   Has TPA been given? No If patient is a Neuro Trauma and patient is going to OR before floor call report to [place] nurse: [phone] or [phone]  , Pulmonary Assessment Handoff:  Lung sounds: Bilateral Breath Sounds: Clear L Breath Sounds: Diminished R Breath Sounds: Rhonchi          R Recommendations: See Admitting Provider Note  Report given to:   Additional Notes:  LEVO, 1500 NS in ED, abx, foley, NGT, ETT. Arrived unresponsive. Just sent flu/rvp. gcs 3 at this time. No gag currently.

## 2019-01-03 NOTE — Consult Note (Signed)
CRITICAL CARE NOTE      CHIEF COMPLAINT:   Unresponsive state, comatose   HPI   This is a 85 yo male w/hx as outlined below in [place] section. He has arrived in MICU on mechanical ventilation post intubation in ED.  History is provided by wife Pam who is next of kin and current POA.  She relates that patient has been doing poorly overall with his health status and recently had numerous surgical procedures for "bleeding".  He was recently seen by GI and had pRBC transfusion due to acute blood loss anemia.  Today he was seen by GI service on outpatient at [facility] and was noted to have difficulty breathing then slumped over in wheel chair and became unresponsive. At that point a provider had evaluated him and noted absence of vital signs with immediate transfer to ED. I spoke to Pat(wife) regarding patient's wishes if he was in critically ill state and she relates that patient would not want aggressive measures.   1430: had family meeting with wife Jeannene Patella, son, daughter on speaker phone and daugter in law present.  After lengthy discourse regarding patient's wishes family states that patient would not want aggressive measures and wishes proceed with comfort care measures.   PAST MEDICAL HISTORY   Past Medical History:  Diagnosis Date   Blind right eye    Diaphragmatic hernia without mention of obstruction or gangrene    Diverticulosis of colon (without mention of hemorrhage)    Dizziness and giddiness    Esophageal reflux    Gastritis 12/18/08   EGD, 3 clips remaining (Dr. Vira Agar)   Hypertension    Iron deficiency anemia    a.  Secondary to recurrent GI bleeding   Macular degeneration    Nonischemic cardiomyopathy (HCC)    a.  TTE and 3/15: EF 35 to 40%, mild left ear, mitral valve repair with mild to moderate MR,  mild to moderate TR, PASP 48; b.  TTE 3/18: EF 25 to 30%, not technically sufficient to allow for LV diastolic function, mild left ear possibly underestimated secondary to degree of CM, moderate AI calcified mitral annulus, moderate MR mod to sev dilated LA mod dilated RA moderate TR PASP 71   Persistent atrial fibrillation    a.  Status post multiple cardioversions; b.  Previously on Coumadin though held secondary to recurrent GI bleed   Personal history of peptic ulcer disease    Pure hypercholesterolemia    Sprain of neck    Unspecified hemorrhoids without mention of complication    Unspecified sleep apnea    Upper GI bleed 7/15-7/20/09   a.  Recurrent GI bleed dating back to at least 2009 with subsequent bleed in 2010, multiple GI bleeds in 01/2018     SURGICAL HISTORY   Past Surgical History:  Procedure Laterality Date   [place] CATHETERIZATION     CARDIOVERSION  12/17/2011   Procedure: CARDIOVERSION;  Surgeon: Hillary Bow, MD;  Location: [facility];  Service: Cardiovascular;  Laterality: N/A;   CATARACT EXTRACTION  10/13/08   left   CHOLECYSTECTOMY     ESOPHAGOGASTRODUODENOSCOPY Left 01/17/2018   Procedure: ESOPHAGOGASTRODUODENOSCOPY (EGD);  Surgeon: Virgel Manifold, MD;  Location: [facility] ENDOSCOPY;  Service: Endoscopy;  Laterality: Left;   ESOPHAGOGASTRODUODENOSCOPY N/A 12/07/2018   Procedure: ESOPHAGOGASTRODUODENOSCOPY (EGD);  Surgeon: Toledo, Benay Pike, MD;  Location: [facility] ENDOSCOPY;  Service: Gastroenterology;  Laterality: N/A;   ESOPHAGOGASTRODUODENOSCOPY N/A 12/10/2018   Procedure: ESOPHAGOGASTRODUODENOSCOPY (EGD);  Surgeon: Virgel Manifold, MD;  Location: [facility] ENDOSCOPY;  Service: Endoscopy;  Laterality: N/A;   ESOPHAGOGASTRODUODENOSCOPY (EGD) WITH PROPOFOL N/A 01/04/2018   Procedure: ESOPHAGOGASTRODUODENOSCOPY (EGD) WITH PROPOFOL;  Surgeon: [name], MD;  Location:  [facility] ENDOSCOPY;  Service: Endoscopy;  Laterality: N/A;   MITRAL VALVE ANNULOPLASTY  1996   MITRAL VALVE REPAIR  1995   PARTIAL GASTRECTOMY       FAMILY HISTORY   Family History  Problem Relation Age of Onset   Cancer Father        lung   Colon cancer Neg Hx    Prostate cancer Neg Hx      SOCIAL HISTORY   Social History   Tobacco Use   Smoking status: Never Smoker   Smokeless tobacco: Never Used  Substance Use Topics   Alcohol use: Yes    Comment: socially, beer    Drug use: No     MEDICATIONS   Current Medication:  Current Facility-Administered Medications:    acetaminophen (TYLENOL) tablet 650 mg, 650 mg, Oral, Q6H PRN **OR** acetaminophen (TYLENOL) suppository 650 mg, 650 mg, Rectal, Q6H PRN, Wieting, Richard, MD   calcium chloride 1 g in sodium chloride 0.9 % 100 mL IVPB, 1 g, Intravenous, Once, Wieting, Richard, MD   dextrose 50 % solution 25 g, 25 g, Intravenous, Once, Wieting, Richard, MD   fentaNYL 2526mcg in NS 247mL (22mcg/ml) infusion-PREMIX, 0-400 mcg/hr, Intravenous, Continuous, Merlyn Lot, MD, Last Rate: 2.5 mL/hr at 12/30/2018 1148, 25 mcg/hr at 12/30/2018 1148   insulin aspart (novoLOG) injection 10 Units, 10 Units, Intravenous, Once, Wieting, Richard, MD   norepinephrine (LEVOPHED) 4 mg in dextrose 5 % 250 mL (0.016 mg/mL) infusion, 0-40 mcg/min, Intravenous, Continuous, Merlyn Lot, MD, Last Rate: 37.5 mL/hr at 2018-12-30 1310, 10 mcg/min at 12-30-18 1310   ondansetron (ZOFRAN) tablet 4 mg, 4 mg, Oral, Q6H PRN **OR** ondansetron (ZOFRAN) injection 4 mg, 4 mg, Intravenous, Q6H PRN, Wieting, Richard, MD   sodium bicarbonate injection 50 mEq, 50 mEq, Intravenous, Once, Wieting, Richard, MD   sodium polystyrene (KAYEXALATE) 15 GM/60ML suspension 45 g, 45 g, Rectal, Once, Wieting, Richard, MD   vancomycin (VANCOCIN) 500 mg in sodium chloride 0.9 % 100 mL IVPB, 500 mg, Intravenous, Once, Nazari, Walid A, RPH    ALLERGIES    Atorvastatin and Statins    REVIEW OF SYSTEMS    Unable to obtain ROS due to critically ill state.   PHYSICAL EXAMINATION   Vitals:   2018-12-30 1251 Dec 30, 2018 1310  BP:  96/63  Pulse: (!) 104 (!) 104  Resp: 19 14  Temp:    SpO2: 100% 98%    GENERAL: comatose HEAD: Normocephalic, atraumatic.  EYES: Pupils equal, round, reactive to light.  No scleral icterus.  MOUTH: Moist mucosal membrane. NECK: Supple. No thyromegaly. No nodules. No JVD.  PULMONARY: decreased breath sounds CARDIOVASCULAR: S1 and S2. Regular rate and rhythm. No murmurs, rubs, or gallops.  GASTROINTESTINAL: Soft, nontender, non-distended. No masses. Positive bowel sounds. No hepatosplenomegaly.  MUSCULOSKELETAL: No swelling, clubbing, or edema.  NEUROLOGIC: Mild distress due to acute illness SKIN:intact,warm,dry   LABS AND IMAGING     LAB RESULTS: Recent Labs  Lab 2018/12/30 1020  NA 124*  K 5.8*  CL 90*  CO2 23  BUN 58*  CREATININE 2.15*  GLUCOSE 106*   Recent Labs  Lab Dec 30, 2018 1020  HGB 8.1*  HCT 25.9*  WBC 11.1*  PLT 536*  IMAGING RESULTS: Ct Head Wo Contrast  Result Date: 01-01-19 CLINICAL DATA:  Altered mental status.  Altered level consciousness EXAM: CT HEAD WITHOUT CONTRAST TECHNIQUE: Contiguous axial images were obtained from the base of the skull through the vertex without intravenous contrast. COMPARISON:  Head CT 03/09/2016 FINDINGS: Brain: No acute intracranial hemorrhage. No focal mass lesion. No CT evidence of acute infarction. No midline shift or mass effect. No hydrocephalus. Basilar cisterns are patent. There are periventricular and subcortical white matter hypodensities. Generalized cortical atrophy. Vascular: No hyperdense vessel or unexpected calcification. Skull: Normal. Negative for fracture or focal lesion. Sinuses/Orbits: Paranasal sinuses and mastoid air cells are clear. Orbits are clear. Other: Endotracheal to the oropharynx. IMPRESSION: 1. No acute  intracranial findings. 2. Atrophy white matter microvascular disease. Electronically Signed   By: Suzy Bouchard M.D.   On: 01-01-19 11:49   Ct Chest W Contrast  Result Date: 01-01-2019 CLINICAL DATA:  85 year old male became unresponsive at clinic appointment today. Recent GI bleeding. Intubated, NG tube placement. EXAM: CT CHEST, ABDOMEN, AND PELVIS WITH CONTRAST TECHNIQUE: Multidetector CT imaging of the chest, abdomen and pelvis was performed following the standard protocol during bolus administration of intravenous contrast. CONTRAST:  12mL OMNIPAQUE IOHEXOL 300 MG/ML  SOLN COMPARISON:  Portable chest and abdomen earlier today. FINDINGS: CT CHEST FINDINGS Cardiovascular: Cardiomegaly. No pericardial effusion. Calcified aortic atherosclerosis. Calcified coronary artery atherosclerosis. No thoracic aortic dissection or aneurysm. The central pulmonary arteries appear to be patent. The atelectatic lung is enhancing. Mediastinum/Nodes: Negative. No lymphadenopathy. Lungs/Pleura: Endotracheal tube in place, tip above the carina. Small volume layering fluid in the lower trachea and mainstem bronchi. Major airways are otherwise patent. Combined layering and loculated large left pleural effusion with simple fluid density. Severe compressive atelectasis of the left lower lobe and lingula. Some of the left upper lobe remains ventilated with mild to moderate upper lobe compressive atelectasis. Contralateral moderate size layering and loculated pleural effusion also with simple fluid density. Compressive atelectasis is most pronounced in the right upper lobe and basal segments of the right lower lobe. The right middle lobe is relatively spared. Musculoskeletal: Prior sternotomy. Mild T4 and T5 superior endplate compression appears nonacute. No acute osseous abnormality identified. CT ABDOMEN PELVIS FINDINGS Hepatobiliary: Surgically absent gallbladder. Negative liver. Pancreas: Negative. Spleen: Negative aside from  small volume of adjacent free fluid. Adrenals/Urinary Tract: Normal adrenal glands. Bilateral renal enhancement is symmetric and normal. There is no renal contrast excretion on the delayed images. No hydronephrosis or hydroureter. Decompressed but severely thick-walled urinary bladder, up to 16 millimeters in thickness. Foley catheter and small volume gas within the bladder. No perivesical stranding identified. Stomach/Bowel: No dilated large or small bowel. Mild diverticulosis in the sigmoid colon. Previous gastrojejunostomy and distal gastrectomy. Enteric tube courses into the stomach and terminates in the gastric remnant. The cecum is on a lax mesentery. The terminal ileum appears within normal limits. The appendix is not identified. No free air. Small volume free fluid in both gutters and adjacent to the spleen. Vascular/Lymphatic: Aortoiliac calcified atherosclerosis. Ectatic infrarenal abdominal aorta up to 27 millimeters diameter. Major arterial structures in the abdomen and pelvis are patent. There is a right common femoral approach venous catheter which terminates at the distal common iliac vein. Portal venous system is patent. No lymphadenopathy identified. Reproductive: Negative. Other: Small volume of layering free fluid in the pelvis superimposed on presacral stranding. Musculoskeletal: No acute osseous abnormality identified. Generalized body wall stranding suggesting edema and a degree of anasarca. IMPRESSION: 1. Large  left and moderate right combined layering and loculated pleural effusions with bilateral compressive atelectasis. 2. Small volume of nonspecific free fluid in the abdomen and pelvis. Mild body wall edema raising the possibility of anasarca. 3. Absent renal contrast excretion on delayed images raising the possibility of renal insufficiency. 4. Circumferentially thick-walled urinary bladder. Consider UTI or other cause of cystitis. 5. Previous gastrectomy and gastrojejunostomy. No bowel  obstruction or convincing focal bowel inflammation identified. 6. Cardiomegaly.  No pericardial effusion. 7. ET tube in good position. Enteric tube terminates in the gastric remnant. 8. Aortic Atherosclerosis (ICD10-I70.0). Ectatic abdominal aorta at risk for aneurysm development. Recommend followup by ultrasound in 5 years. This recommendation follows ACR consensus guidelines: White Paper of the ACR Incidental Findings Committee II on Vascular Findings. J Am Coll Radiol 2013; 10:789-794. Electronically Signed   By: Genevie Ann M.D.   On: 30-Dec-2018 12:01   Ct Abdomen Pelvis W Contrast  Result Date: 12/30/2018 CLINICAL DATA:  85 year old male became unresponsive at clinic appointment today. Recent GI bleeding. Intubated, NG tube placement. EXAM: CT CHEST, ABDOMEN, AND PELVIS WITH CONTRAST TECHNIQUE: Multidetector CT imaging of the chest, abdomen and pelvis was performed following the standard protocol during bolus administration of intravenous contrast. CONTRAST:  181mL OMNIPAQUE IOHEXOL 300 MG/ML  SOLN COMPARISON:  Portable chest and abdomen earlier today. FINDINGS: CT CHEST FINDINGS Cardiovascular: Cardiomegaly. No pericardial effusion. Calcified aortic atherosclerosis. Calcified coronary artery atherosclerosis. No thoracic aortic dissection or aneurysm. The central pulmonary arteries appear to be patent. The atelectatic lung is enhancing. Mediastinum/Nodes: Negative. No lymphadenopathy. Lungs/Pleura: Endotracheal tube in place, tip above the carina. Small volume layering fluid in the lower trachea and mainstem bronchi. Major airways are otherwise patent. Combined layering and loculated large left pleural effusion with simple fluid density. Severe compressive atelectasis of the left lower lobe and lingula. Some of the left upper lobe remains ventilated with mild to moderate upper lobe compressive atelectasis. Contralateral moderate size layering and loculated pleural effusion also with simple fluid density.  Compressive atelectasis is most pronounced in the right upper lobe and basal segments of the right lower lobe. The right middle lobe is relatively spared. Musculoskeletal: Prior sternotomy. Mild T4 and T5 superior endplate compression appears nonacute. No acute osseous abnormality identified. CT ABDOMEN PELVIS FINDINGS Hepatobiliary: Surgically absent gallbladder. Negative liver. Pancreas: Negative. Spleen: Negative aside from small volume of adjacent free fluid. Adrenals/Urinary Tract: Normal adrenal glands. Bilateral renal enhancement is symmetric and normal. There is no renal contrast excretion on the delayed images. No hydronephrosis or hydroureter. Decompressed but severely thick-walled urinary bladder, up to 16 millimeters in thickness. Foley catheter and small volume gas within the bladder. No perivesical stranding identified. Stomach/Bowel: No dilated large or small bowel. Mild diverticulosis in the sigmoid colon. Previous gastrojejunostomy and distal gastrectomy. Enteric tube courses into the stomach and terminates in the gastric remnant. The cecum is on a lax mesentery. The terminal ileum appears within normal limits. The appendix is not identified. No free air. Small volume free fluid in both gutters and adjacent to the spleen. Vascular/Lymphatic: Aortoiliac calcified atherosclerosis. Ectatic infrarenal abdominal aorta up to 27 millimeters diameter. Major arterial structures in the abdomen and pelvis are patent. There is a right common femoral approach venous catheter which terminates at the distal common iliac vein. Portal venous system is patent. No lymphadenopathy identified. Reproductive: Negative. Other: Small volume of layering free fluid in the pelvis superimposed on presacral stranding. Musculoskeletal: No acute osseous abnormality identified. Generalized body wall stranding suggesting edema and  a degree of anasarca. IMPRESSION: 1. Large left and moderate right combined layering and loculated  pleural effusions with bilateral compressive atelectasis. 2. Small volume of nonspecific free fluid in the abdomen and pelvis. Mild body wall edema raising the possibility of anasarca. 3. Absent renal contrast excretion on delayed images raising the possibility of renal insufficiency. 4. Circumferentially thick-walled urinary bladder. Consider UTI or other cause of cystitis. 5. Previous gastrectomy and gastrojejunostomy. No bowel obstruction or convincing focal bowel inflammation identified. 6. Cardiomegaly.  No pericardial effusion. 7. ET tube in good position. Enteric tube terminates in the gastric remnant. 8. Aortic Atherosclerosis (ICD10-I70.0). Ectatic abdominal aorta at risk for aneurysm development. Recommend followup by ultrasound in 5 years. This recommendation follows ACR consensus guidelines: White Paper of the ACR Incidental Findings Committee II on Vascular Findings. J Am Coll Radiol 2013; 10:789-794. Electronically Signed   By: Genevie Ann M.D.   On: 01/21/2019 12:01   Dg Chest Portable 1 View  Addendum Date: 01-21-2019   ADDENDUM REPORT: 01/21/19 11:03 ADDENDUM: Study discussed by telephone with Dr. Merlyn Lot on 01-21-2019 at 11:02 . Electronically Signed   By: Genevie Ann M.D.   On: January 21, 2019 11:03   Result Date: 01-21-19 CLINICAL DATA:  85 year old male became unresponsive at clinic appointment. Intubated and NG tube placement. EXAM: PORTABLE CHEST 1 VIEW COMPARISON:  Portable chest 03/09/2016. FINDINGS: Portable AP supine view at 1036 hours. Endotracheal tube tip in good position between the level the clavicles and carina. Enteric tube courses to the abdomen, tip not included. Resuscitation pads overlie the left chest. There is a large abnormal rounded area of opacity occupying the left hemithorax except for the medial lung apex. The mediastinal contour appears abnormally widened. There is similar rounded and confluent opacity in the right upper lung. No definite pneumothorax. Paucity of  bowel gas in the upper abdomen. No acute osseous abnormality identified. IMPRESSION: 1. ET tube in good position. Enteric tube courses to the abdomen, tip not included. 2. Abnormal rounded subtotal opacity in the left lung with similar confluent right upper lung opacity and abnormal mediastinal widening. These findings are nonspecific and might reflect sequelae of trauma or tumor. Recommend Chest CT (IV contrast) to further characterize. Electronically Signed: By: Genevie Ann M.D. On: 01-21-2019 10:57   Dg Abd Portable 1 View  Result Date: 01/21/19 CLINICAL DATA:  85 year old male became unresponsive at a clinic appointment. EXAM: PORTABLE ABDOMEN - 1 VIEW COMPARISON:  Portable chest today reported separately. FINDINGS: Portable AP supine view at 1026 hours. Enteric tube courses to the abdomen and the side hole is at the level of the proximal stomach. There is gas in nondilated small and large bowel loops which are confined to the abdomen, no evidence of bowel herniation into the left chest on this image. Right upper quadrant cholecystectomy clips. No acute osseous abnormality identified. IMPRESSION: 1. Enteric tube in good position, side hole at the level of the proximal stomach. 2. Gas-filled bowel loops in the abdomen might indicate ileus. No evidence of bowel herniation into the left chest. Study discussed by telephone with Dr. Merlyn Lot on 2019-01-21 at 11:02 . Electronically Signed   By: Genevie Ann M.D.   On: 01/21/19 11:03      ASSESSMENT AND PLAN    -Multidisciplinary rounds held today  Acute Hypoxic Respiratory Failure -due to comatose state with bilateral large pleural effusions.  -continue Full MV support -continue Bronchodilator Therapy -Wean Fio2 and PEEP as tolerated   CARDIAC FAILURE- -circulatory shock  requiring vasopressor support- currently on Levophed -hypovolemic vs hemoragic - hx or recurrent GIB.  -follow up cardiac enzymes as indicated ICU monitoring  Renal  Failure-most likely due to ATN -acute kidney injury KDIGO stage 3 - hypoperfusion related likey -follow chem 7 -follow UO -continue Foley Catheter-assess need daily   NEUROLOGY - intubated and sedated - minimal sedation to achieve a RASS goal: -1 Wake up assessment pending  Hyponatremia    -  Repletion with NS goal <59meg/24h   Hyperkalemia     -kayexalate   ID -continue IV abx as prescibed -follow up cultures  GI/Nutrition GI PROPHYLAXIS as indicated DIET-->TF's as tolerated Constipation protocol as indicated  ENDO - ICU hypoglycemic\Hyperglycemia protocol -check FSBS per protocol   ELECTROLYTES -follow labs as needed -replace as needed -pharmacy consultation   DVT/GI PRX ordered -SCDs  TRANSFUSIONS AS NEEDED MONITOR FSBS ASSESS the need for LABS as needed   Critical care provider statement:    Critical care time (minutes):  130   Critical care time was exclusive of:  Separately billable procedures and treating other patients   Critical care was necessary to treat or prevent imminent or life-threatening deterioration of the following conditions:  comatose state, AKI, acute hypoxic respiratory failure, large pleural effusion, liver failure   Critical care was time spent personally by me on the following activities:  Development of treatment plan with patient or surrogate, discussions with consultants, evaluation of patient's response to treatment, examination of patient, obtaining history from patient or surrogate, ordering and performing treatments and interventions, ordering and review of laboratory studies and re-evaluation of patient's condition.  I assumed direction of critical care for this patient from another provider in my specialty: no    This document was prepared using Dragon voice recognition software and may include unintentional dictation errors.    Ottie Glazier, M.D.  Division of [place]

## 2019-01-03 NOTE — ED Notes (Signed)
Dr Quentin Cornwall aware of all critical labs

## 2019-01-03 NOTE — Progress Notes (Signed)
Pt made comfort care by family and extubated with RT. Morphine infusion started and chaplain called to bedside for family support. Pt asystole at 1710 and pronounced by this RN and Sharlee Blew, RN. Assistance offered to family and support given.

## 2019-01-03 NOTE — Discharge Summary (Signed)
DISCHARGE SUMMARY/ DEATH NOTE   Unresponsive state, comatose     1430: had family meeting with wife Pam, son, daughter on speaker phone and daugter in law present.  After lengthy discourse regarding patient's wishes family states that patient would not want aggressive measures and wishes proceed with comfort care measures.   Patient pronounced deceased at 35  PAST MEDICAL HISTORY       Past Medical History:  Diagnosis Date  . Blind right eye   . Diaphragmatic hernia without mention of obstruction or gangrene   . Diverticulosis of colon (without mention of hemorrhage)   . Dizziness and giddiness   . Esophageal reflux   . Gastritis 12/18/08   EGD, 3 clips remaining (Dr. Vira Agar)  . Hypertension   . Iron deficiency anemia    a.  Secondary to recurrent GI bleeding  . Macular degeneration   . Nonischemic cardiomyopathy (HCC)    a.  TTE and 3/15: EF 35 to 40%, mild left ear, mitral valve repair with mild to moderate MR, mild to moderate TR, PASP 48; b.  TTE 3/18: EF 25 to 30%, not technically sufficient to allow for LV diastolic function, mild left ear possibly underestimated secondary to degree of CM, moderate AI calcified mitral annulus, moderate MR mod to sev dilated LA mod dilated RA moderate TR PASP 71  . Persistent atrial fibrillation    a.  Status post multiple cardioversions; b.  Previously on Coumadin though held secondary to recurrent GI bleed  . Personal history of peptic ulcer disease   . Pure hypercholesterolemia   . Sprain of neck   . Unspecified hemorrhoids without mention of complication   . Unspecified sleep apnea   . Upper GI bleed 7/15-7/20/09   a.  Recurrent GI bleed dating back to at least 2009 with subsequent bleed in 2010, multiple GI bleeds in 01/2018     SURGICAL HISTORY        Past Surgical History:  Procedure Laterality Date  . APPENDECTOMY    . CARDIAC CATHETERIZATION  1996   [facility]  . CARDIAC  CATHETERIZATION    . CARDIAC CATHETERIZATION    . CARDIOVERSION  12/17/2011   Procedure: CARDIOVERSION;  Surgeon: Hillary Bow, MD;  Location: [place];  Service: Cardiovascular;  Laterality: N/A;  . CATARACT EXTRACTION  10/13/08   left  . CHOLECYSTECTOMY    . ESOPHAGOGASTRODUODENOSCOPY Left 01/17/2018   Procedure: ESOPHAGOGASTRODUODENOSCOPY (EGD);  Surgeon: Virgel Manifold, MD;  Location: [facility] ENDOSCOPY;  Service: Endoscopy;  Laterality: Left;  . ESOPHAGOGASTRODUODENOSCOPY N/A 12/07/2018   Procedure: ESOPHAGOGASTRODUODENOSCOPY (EGD);  Surgeon: Toledo, Benay Pike, MD;  Location: [facility] ENDOSCOPY;  Service: Gastroenterology;  Laterality: N/A;  . ESOPHAGOGASTRODUODENOSCOPY N/A 12/10/2018   Procedure: ESOPHAGOGASTRODUODENOSCOPY (EGD);  Surgeon: Virgel Manifold, MD;  Location: [facility] ENDOSCOPY;  Service: Endoscopy;  Laterality: N/A;  . ESOPHAGOGASTRODUODENOSCOPY (EGD) WITH PROPOFOL N/A 01/04/2018   Procedure: ESOPHAGOGASTRODUODENOSCOPY (EGD) WITH PROPOFOL;  Surgeon: [name], MD;  Location: [facility] ENDOSCOPY;  Service: Endoscopy;  Laterality: N/A;  . MITRAL VALVE ANNULOPLASTY  1996  . MITRAL VALVE REPAIR  1995  . PARTIAL GASTRECTOMY       FAMILY HISTORY        Family History  Problem Relation Age of Onset  . Cancer Father        lung  . Colon cancer Neg Hx   . Prostate cancer Neg Hx      SOCIAL HISTORY   Social History  Tobacco Use  . Smoking status: Never Smoker  . Smokeless tobacco: Never Used  Substance Use Topics  . Alcohol use: Yes    Comment: socially, beer   . Drug use: No       ASSESSMENT AND PLAN      Acute Hypoxic Respiratory Failure -due to comatose state with bilateral large pleural effusions.  -continue Full MV support -continue Bronchodilator Therapy -Wean Fio2 and PEEP as tolerated   CARDIAC FAILURE- -circulatory shock requiring vasopressor support-  -hypovolemic vs hemoragic - hx or recurrent GIB.   -follow up cardiac enzymes as indicated ICU monitoring  Renal Failure-most likely due to ATN -acute kidney injury KDIGO stage 3 - hypoperfusion related likey -follow chem 7 -follow UO -continue Foley Catheter-assess need daily   NEUROLOGY - intubated and sedated - minimal sedation to achieve a RASS goal: -1 Wake up assessment pending  Hyponatremia    -  Repletion with NS goal <9meg/24h   Hyperkalemia     -kayexalate   ID -continue IV abx as prescibed -follow up cultures  GI/Nutrition GI PROPHYLAXIS as indicated DIET-->TF's as tolerated Constipation protocol as indicated  ENDO - ICU hypoglycemic\Hyperglycemia protocol -check FSBS per protocol   ELECTROLYTES -follow labs as needed -replace as needed -pharmacy consultation   DVT/GI PRX ordered -SCDs  TRANSFUSIONS AS NEEDED MONITOR FSBS ASSESS the need for LABS as needed     Ottie Glazier, M.D.  [facility] [place]

## 2019-01-03 NOTE — Consult Note (Addendum)
Cardiology Consult    Patient ID: JAMAREE HOSIER MRN: [id number], DOB/AGE: [birth year]   Admit date: December 27, 2018 Date of Consult: 12/27/18  Primary Physician: Tonia Ghent, MD Primary Cardiologist: Kathlyn Sacramento, MD Requesting Provider: R. F[place]  Patient Profile    ALYX GEE is a 85 y.o. male with a history of nonischemic cardiomyopathy with an EF of 20%, HFrEF, persistent atrial fibrillation managed with amiodarone therapy, recurrent GI bleeds, chronic hypotension, iron deficiency anemia, gastritis, GERD, and hyperlipidemia, who is being seen today for the evaluation of acute hypoxic respiratory failure with mild troponin elevation and left greater than right bilateral pleural effusions at the request of Dr. Leslye Peer  Past Medical History   Past Medical History:  Diagnosis Date   Blind right eye    Diaphragmatic hernia without mention of obstruction or gangrene    Diverticulosis of colon (without mention of hemorrhage)    Dizziness and giddiness    Esophageal reflux    Gastritis 12/18/08   EGD, 3 clips remaining (Dr. Vira Agar)   Hypertension    Hyponatremia    Iron deficiency anemia    a.  Secondary to recurrent GI bleeding   Macular degeneration    Nonischemic cardiomyopathy ([place])    a.  TTE and 3/15: EF 35 to 40%, mild AS, mitral valve repair with mild to moderate MR, mild to moderate TR, PASP 48; b. TTE 3/18: EF 25 to 30%; c. 10/2018 Echo: EF 20%, glob HK.  Mild to mod MR. Sev dil LA. Mod dil RV, mildly reduced RV fxn. Mod TR. PASP 80mmHg.   Persistent atrial fibrillation    a.  Status post multiple cardioversions; b.  Previously on Coumadin though held secondary to recurrent GI bleed; c. Rhythm mgmt w/ oral amio.   Personal history of peptic ulcer disease    Pure hypercholesterolemia    Sprain of neck    Unspecified hemorrhoids without mention of complication    Unspecified sleep apnea    Upper GI bleed    a.  Recurrent GI bleed  dating back to at least 2009 with subsequent bleed in 2010, 01/2018, and 12/2018; b. 12/2018 EGD [facility]): multiple shallow ulcers and oozing of blood from thermal coagulation sites-->conservative mgmt.    Past Surgical History:  Procedure Laterality Date   APPENDECTOMY     CARDIAC CATHETERIZATION  1996   [facility]   CARDIAC CATHETERIZATION     CARDIAC CATHETERIZATION     CARDIOVERSION  12/17/2011   Procedure: CARDIOVERSION;  Surgeon: Hillary Bow, MD;  Location: [place];  Service: Cardiovascular;  Laterality: N/A;   CATARACT EXTRACTION  10/13/08   left   CHOLECYSTECTOMY     ESOPHAGOGASTRODUODENOSCOPY Left 01/17/2018   Procedure: ESOPHAGOGASTRODUODENOSCOPY (EGD);  Surgeon: Virgel Manifold, MD;  Location: [facility] ENDOSCOPY;  Service: Endoscopy;  Laterality: Left;   ESOPHAGOGASTRODUODENOSCOPY N/A 12/07/2018   Procedure: ESOPHAGOGASTRODUODENOSCOPY (EGD);  Surgeon: Toledo, Benay Pike, MD;  Location: [facility] ENDOSCOPY;  Service: Gastroenterology;  Laterality: N/A;   ESOPHAGOGASTRODUODENOSCOPY N/A 12/10/2018   Procedure: ESOPHAGOGASTRODUODENOSCOPY (EGD);  Surgeon: Virgel Manifold, MD;  Location: [facility] ENDOSCOPY;  Service: Endoscopy;  Laterality: N/A;   ESOPHAGOGASTRODUODENOSCOPY (EGD) WITH PROPOFOL N/A 01/04/2018   Procedure: ESOPHAGOGASTRODUODENOSCOPY (EGD) WITH PROPOFOL;  Surgeon: [name], MD;  Location: [facility] ENDOSCOPY;  Service: Endoscopy;  Laterality: N/A;   MITRAL VALVE ANNULOPLASTY  1996   MITRAL VALVE REPAIR  1995   PARTIAL GASTRECTOMY       Allergies  Allergies  Allergen Reactions   Atorvastatin Other (  See Comments)    Other reaction(s): Unknown REACTION:muscular soreness   Statins     Muscle aches on mult statins    History of Present Illness    85 year old male with the above complex past medical history including persistent atrial fibrillation on chronic rhythm management-amiodarone, HFrEF, nonischemic cardiomyopathy with an EF of 20%, mitral valve disease status  post angioplasty 1996, recurrent GI bleeds, chronic hypotension, iron deficiency anemia, GERD, bilateral carotid artery disease with known occluded right internal carotid artery, chronic hyponatremia, hyperlipidemia, sleep apnea, gastritis, and GERD.  He was last seen in cardiology clinic in August 2019 and subsequently underwent echocardiography in January 2020 which showed worsening LV dysfunction with an EF of 20% and elevated pulmonary pressure of 71 mmHg.  Unfortunately, he was admitted in early March with GI bleeding and melena.  He was placed on twice daily PPI therapy and did not require transfusion on initial admission.  He was discharged home but then returned a day later with worsening melena.  Initial EGD showed multiple bleeding angioectasias in the stomach and progressive hgb drop to 6.6.  He required 3u PRBC's.  A second EGD on 3/8 showed frail mucosa w/ spontaneous bleeding. Prior Billroth II gastrojejunal anastamosis was found and was erythematous, friable, and ulcerated.  Decision was made to tx to [facility] for further eval.  Repeat EGD showed mult shallow ulcers and oozing of blood from thermal coagulation sites.  No interventions were performed.  IR was consulted for possible embolization  conservative rx recommended. Acute care surgery was consulted  conservative rx recommended.  He did require one additional unit of blood but was discharged on 3/16.  Events since discharge are not entirely clear at this time.  The patient showed up for a f/u GI appt today and was noted to be unresponsive and reportedly pulseless, sitting in his w/c.  He was rushed to the ED where he was intubated. Lactate was elevated @ 2.4. Creat acutely elevated @ 2.15 (0.55 on 3/8).  CT of the chest/abd/pelvis showed a large L and mod R effusions and small volume of nonspecific free fluid in the abd and pelvis.  Flu A/B screens neg.  Trop 0.15.  Pt admitted to ICU and we've been asked to eval.  Inpatient Medications      dextrose  25 g Intravenous Once   insulin aspart  10 Units Intravenous Once   sodium bicarbonate  50 mEq Intravenous Once   sodium polystyrene  45 g Rectal Once    Family History - obtained from [facility].    Family History  Problem Relation Age of Onset   Cancer Father        lung   Colon cancer Neg Hx    Prostate cancer Neg Hx    No premature CAD.  Both parents and sister deceased.  Social History - obtained from [facility].    Social History   Socioeconomic History   Marital status: Married    Spouse name: Not on file   Number of children: 3   Years of education: Not on file   Highest education level: Not on file  Occupational History   Occupation: heating and air    Comment: retired  Scientist, product/process development strain: Not on file   Food insecurity:    Worry: Not on file    Inability: Not on file   Transportation needs:    Medical: Not on file    Non-medical: Not on file  Tobacco Use  Smoking status: Never Smoker   Smokeless tobacco: Never Used  Substance and Sexual Activity   Alcohol use: Yes    Comment: socially, beer    Drug use: No   Sexual activity: Not on file  Lifestyle   Physical activity:    Days per week: Not on file    Minutes per session: Not on file   Stress: Not on file  Relationships   Social connections:    Talks on phone: Not on file    Gets together: Not on file    Attends religious service: Not on file    Active member of club or organization: Not on file    Attends meetings of clubs or organizations: Not on file    Relationship status: Not on file   Intimate partner violence:    Fear of current or ex partner: Not on file    Emotionally abused: Not on file    Physically abused: Not on file    Forced sexual activity: Not on file  Other Topics Concern   Not on file  Social History Narrative   Married 30+ years    3 kids     Review of Systems    Pt is critically ill, intubated, sedated, and unable to  provide a review of systems at this time.  Physical Exam    Blood pressure 96/63, pulse (!) 104, temperature 98.7 F (37.1 C), temperature source Rectal, resp. rate 14, height 5\' 11"  (1.803 m), weight 78.8 kg, SpO2 98 %.  General: intubated and sedated. Not able to answer questions/follow commands. Psych: sedated. Neuro: sedated. Does not follow commands. HEENT: Normal  Neck: Supple without bruits or JVD. Lungs:  Resp regular and unlabored, diminished breath sounds w/ scattered rhonchi. Heart: RRR, distant heart sounds, no s3, s4, or murmurs. Abdomen: Soft, non-tender, non-distended, BS + x 4.  Extremities: No clubbing, cyanosis or edema. DP/PT/Radials 2+ and equal bilaterally.  Labs    Troponin  Recent Labs    01/03/19 1020  TROPONINI 0.15*   Lab Results  Component Value Date   WBC 11.1 (H) 2019/01/03   HGB 8.1 (L) 01-03-2019   HCT 25.9 (L) Jan 03, 2019   MCV 81.7 2019-01-03   PLT 536 (H) 2019/01/03    Recent Labs  Lab Jan 03, 2019 1020  NA 124*  K 5.8*  CL 90*  CO2 23  BUN 58*  CREATININE 2.15*  CALCIUM 7.1*  PROT 4.6*  BILITOT 2.0*  ALKPHOS 50  ALT 21  AST 40  GLUCOSE 106*   Lab Results  Component Value Date   CHOL 178 10/20/2017   HDL 59 10/20/2017   LDLCALC 104 (H) 10/20/2017   TRIG 75 10/20/2017     Radiology Studies    Ct Head Wo Contrast  Result Date: 01/03/19 CLINICAL DATA:  Altered mental status.  Altered level consciousness EXAM: CT HEAD WITHOUT CONTRAST TECHNIQUE: Contiguous axial images were obtained from the base of the skull through the vertex without intravenous contrast. COMPARISON:  Head CT 03/09/2016 FINDINGS: Brain: No acute intracranial hemorrhage. No focal mass lesion. No CT evidence of acute infarction. No midline shift or mass effect. No hydrocephalus. Basilar cisterns are patent. There are periventricular and subcortical white matter hypodensities. Generalized cortical atrophy. Vascular: No hyperdense vessel or unexpected  calcification. Skull: Normal. Negative for fracture or focal lesion. Sinuses/Orbits: Paranasal sinuses and mastoid air cells are clear. Orbits are clear. Other: Endotracheal to the oropharynx. IMPRESSION: 1. No acute intracranial findings. 2. Atrophy white matter microvascular disease.  Electronically Signed   By: Suzy Bouchard M.D.   On: 01/03/19 11:49   Ct Chest W Contrast  Result Date: 01/03/19 CLINICAL DATA:  85 year old male became unresponsive at clinic appointment today. Recent GI bleeding. Intubated, NG tube placement. EXAM: CT CHEST, ABDOMEN, AND PELVIS WITH CONTRAST TECHNIQUE: Multidetector CT imaging of the chest, abdomen and pelvis was performed following the standard protocol during bolus administration of intravenous contrast. CONTRAST:  149mL OMNIPAQUE IOHEXOL 300 MG/ML  SOLN COMPARISON:  Portable chest and abdomen earlier today. FINDINGS: CT CHEST FINDINGS Cardiovascular: Cardiomegaly. No pericardial effusion. Calcified aortic atherosclerosis. Calcified coronary artery atherosclerosis. No thoracic aortic dissection or aneurysm. The central pulmonary arteries appear to be patent. The atelectatic lung is enhancing. Mediastinum/Nodes: Negative. No lymphadenopathy. Lungs/Pleura: Endotracheal tube in place, tip above the carina. Small volume layering fluid in the lower trachea and mainstem bronchi. Major airways are otherwise patent. Combined layering and loculated large left pleural effusion with simple fluid density. Severe compressive atelectasis of the left lower lobe and lingula. Some of the left upper lobe remains ventilated with mild to moderate upper lobe compressive atelectasis. Contralateral moderate size layering and loculated pleural effusion also with simple fluid density. Compressive atelectasis is most pronounced in the right upper lobe and basal segments of the right lower lobe. The right middle lobe is relatively spared. Musculoskeletal: Prior sternotomy. Mild T4 and T5 superior  endplate compression appears nonacute. No acute osseous abnormality identified. CT ABDOMEN PELVIS FINDINGS Hepatobiliary: Surgically absent gallbladder. Negative liver. Pancreas: Negative. Spleen: Negative aside from small volume of adjacent free fluid. Adrenals/Urinary Tract: Normal adrenal glands. Bilateral renal enhancement is symmetric and normal. There is no renal contrast excretion on the delayed images. No hydronephrosis or hydroureter. Decompressed but severely thick-walled urinary bladder, up to 16 millimeters in thickness. Foley catheter and small volume gas within the bladder. No perivesical stranding identified. Stomach/Bowel: No dilated large or small bowel. Mild diverticulosis in the sigmoid colon. Previous gastrojejunostomy and distal gastrectomy. Enteric tube courses into the stomach and terminates in the gastric remnant. The cecum is on a lax mesentery. The terminal ileum appears within normal limits. The appendix is not identified. No free air. Small volume free fluid in both gutters and adjacent to the spleen. Vascular/Lymphatic: Aortoiliac calcified atherosclerosis. Ectatic infrarenal abdominal aorta up to 27 millimeters diameter. Major arterial structures in the abdomen and pelvis are patent. There is a right common femoral approach venous catheter which terminates at the distal common iliac vein. Portal venous system is patent. No lymphadenopathy identified. Reproductive: Negative. Other: Small volume of layering free fluid in the pelvis superimposed on presacral stranding. Musculoskeletal: No acute osseous abnormality identified. Generalized body wall stranding suggesting edema and a degree of anasarca. IMPRESSION: 1. Large left and moderate right combined layering and loculated pleural effusions with bilateral compressive atelectasis. 2. Small volume of nonspecific free fluid in the abdomen and pelvis. Mild body wall edema raising the possibility of anasarca. 3. Absent renal contrast  excretion on delayed images raising the possibility of renal insufficiency. 4. Circumferentially thick-walled urinary bladder. Consider UTI or other cause of cystitis. 5. Previous gastrectomy and gastrojejunostomy. No bowel obstruction or convincing focal bowel inflammation identified. 6. Cardiomegaly.  No pericardial effusion. 7. ET tube in good position. Enteric tube terminates in the gastric remnant. 8. Aortic Atherosclerosis (ICD10-I70.0). Ectatic abdominal aorta at risk for aneurysm development. Recommend followup by ultrasound in 5 years. This recommendation follows ACR consensus guidelines: White Paper of the ACR Incidental Findings Committee II on Vascular Findings.  J Am Coll Radiol 2013; 10:789-794. Electronically Signed   By: Genevie Ann M.D.   On: 01/18/19 12:01   Ct Abdomen Pelvis W Contrast  Result Date: Jan 18, 2019 CLINICAL DATA:  85 year old male became unresponsive at clinic appointment today. Recent GI bleeding. Intubated, NG tube placement. EXAM: CT CHEST, ABDOMEN, AND PELVIS WITH CONTRAST TECHNIQUE: Multidetector CT imaging of the chest, abdomen and pelvis was performed following the standard protocol during bolus administration of intravenous contrast. CONTRAST:  13mL OMNIPAQUE IOHEXOL 300 MG/ML  SOLN COMPARISON:  Portable chest and abdomen earlier today. FINDINGS: CT CHEST FINDINGS Cardiovascular: Cardiomegaly. No pericardial effusion. Calcified aortic atherosclerosis. Calcified coronary artery atherosclerosis. No thoracic aortic dissection or aneurysm. The central pulmonary arteries appear to be patent. The atelectatic lung is enhancing. Mediastinum/Nodes: Negative. No lymphadenopathy. Lungs/Pleura: Endotracheal tube in place, tip above the carina. Small volume layering fluid in the lower trachea and mainstem bronchi. Major airways are otherwise patent. Combined layering and loculated large left pleural effusion with simple fluid density. Severe compressive atelectasis of the left lower lobe  and lingula. Some of the left upper lobe remains ventilated with mild to moderate upper lobe compressive atelectasis. Contralateral moderate size layering and loculated pleural effusion also with simple fluid density. Compressive atelectasis is most pronounced in the right upper lobe and basal segments of the right lower lobe. The right middle lobe is relatively spared. Musculoskeletal: Prior sternotomy. Mild T4 and T5 superior endplate compression appears nonacute. No acute osseous abnormality identified. CT ABDOMEN PELVIS FINDINGS Hepatobiliary: Surgically absent gallbladder. Negative liver. Pancreas: Negative. Spleen: Negative aside from small volume of adjacent free fluid. Adrenals/Urinary Tract: Normal adrenal glands. Bilateral renal enhancement is symmetric and normal. There is no renal contrast excretion on the delayed images. No hydronephrosis or hydroureter. Decompressed but severely thick-walled urinary bladder, up to 16 millimeters in thickness. Foley catheter and small volume gas within the bladder. No perivesical stranding identified. Stomach/Bowel: No dilated large or small bowel. Mild diverticulosis in the sigmoid colon. Previous gastrojejunostomy and distal gastrectomy. Enteric tube courses into the stomach and terminates in the gastric remnant. The cecum is on a lax mesentery. The terminal ileum appears within normal limits. The appendix is not identified. No free air. Small volume free fluid in both gutters and adjacent to the spleen. Vascular/Lymphatic: Aortoiliac calcified atherosclerosis. Ectatic infrarenal abdominal aorta up to 27 millimeters diameter. Major arterial structures in the abdomen and pelvis are patent. There is a right common femoral approach venous catheter which terminates at the distal common iliac vein. Portal venous system is patent. No lymphadenopathy identified. Reproductive: Negative. Other: Small volume of layering free fluid in the pelvis superimposed on presacral  stranding. Musculoskeletal: No acute osseous abnormality identified. Generalized body wall stranding suggesting edema and a degree of anasarca. IMPRESSION: 1. Large left and moderate right combined layering and loculated pleural effusions with bilateral compressive atelectasis. 2. Small volume of nonspecific free fluid in the abdomen and pelvis. Mild body wall edema raising the possibility of anasarca. 3. Absent renal contrast excretion on delayed images raising the possibility of renal insufficiency. 4. Circumferentially thick-walled urinary bladder. Consider UTI or other cause of cystitis. 5. Previous gastrectomy and gastrojejunostomy. No bowel obstruction or convincing focal bowel inflammation identified. 6. Cardiomegaly.  No pericardial effusion. 7. ET tube in good position. Enteric tube terminates in the gastric remnant. 8. Aortic Atherosclerosis (ICD10-I70.0). Ectatic abdominal aorta at risk for aneurysm development. Recommend followup by ultrasound in 5 years. This recommendation follows ACR consensus guidelines: White Paper of the ACR  Incidental Findings Committee II on Vascular Findings. J Am Coll Radiol 2013; 10:789-794. Electronically Signed   By: Genevie Ann M.D.   On: 01-18-2019 12:01   Dg Chest Portable 1 View  Addendum Date: 01/18/2019   ADDENDUM REPORT: 01/18/2019 11:03 ADDENDUM: Study discussed by telephone with Dr. Merlyn Lot on 01-18-19 at 11:02 . Electronically Signed   By: Genevie Ann M.D.   On: 01-18-2019 11:03   Result Date: 2019/01/18 CLINICAL DATA:  85 year old male became unresponsive at clinic appointment. Intubated and NG tube placement. EXAM: PORTABLE CHEST 1 VIEW COMPARISON:  Portable chest 03/09/2016. FINDINGS: Portable AP supine view at 1036 hours. Endotracheal tube tip in good position between the level the clavicles and carina. Enteric tube courses to the abdomen, tip not included. Resuscitation pads overlie the left chest. There is a large abnormal rounded area of opacity  occupying the left hemithorax except for the medial lung apex. The mediastinal contour appears abnormally widened. There is similar rounded and confluent opacity in the right upper lung. No definite pneumothorax. Paucity of bowel gas in the upper abdomen. No acute osseous abnormality identified. IMPRESSION: 1. ET tube in good position. Enteric tube courses to the abdomen, tip not included. 2. Abnormal rounded subtotal opacity in the left lung with similar confluent right upper lung opacity and abnormal mediastinal widening. These findings are nonspecific and might reflect sequelae of trauma or tumor. Recommend Chest CT (IV contrast) to further characterize. Electronically Signed: By: Genevie Ann M.D. On: January 18, 2019 10:57   Dg Abd Portable 1 View  Result Date: 2019-01-18 CLINICAL DATA:  85 year old male became unresponsive at a clinic appointment. EXAM: PORTABLE ABDOMEN - 1 VIEW COMPARISON:  Portable chest today reported separately. FINDINGS: Portable AP supine view at 1026 hours. Enteric tube courses to the abdomen and the side hole is at the level of the proximal stomach. There is gas in nondilated small and large bowel loops which are confined to the abdomen, no evidence of bowel herniation into the left chest on this image. Right upper quadrant cholecystectomy clips. No acute osseous abnormality identified. IMPRESSION: 1. Enteric tube in good position, side hole at the level of the proximal stomach. 2. Gas-filled bowel loops in the abdomen might indicate ileus. No evidence of bowel herniation into the left chest. Study discussed by telephone with Dr. Merlyn Lot on January 18, 2019 at 11:02 . Electronically Signed   By: Genevie Ann M.D.   On: 01/18/19 11:03    ECG & Cardiac Imaging    RSR, 60, 1st deg avb, IVCD, anteroseptal infarct, baseline artifact  - personally reviewed.  Assessment & Plan    1.  Acute hypoxic respiratory failure:  Unclear circumstances leading up to admission as he apparently became  unresponsive at outpt provider office and required immediate intubation upon arrival to the emergency department.  CT notable for bilateral large pleural effusions.  Critical care management on board and bronchodilator and antibiotic therapy.  2.  HFrEF/nonischemic cardiomyopathy/elevated troponin: EF 20% by echo in January.  Troponin elevation likely represents demand ischemia in the setting of above.  No role for ischemic evaluation at this time. Currently requiring vasopressors.  No significant evidence of volume overload.    3.  Hypotension: In the setting of above.  Currently on norepinephrine.  Critical care following.  4.  Acute kidney injury: Likely secondary to hypoperfusion and hypotension.  5.  Bilateral pleural effusions: Management per critical care medicine.  6.  PAF:  In sinus and on amio.  No OAC  2/2 GIBs.  Signed, Murray Hodgkins, NP 01/11/19, 3:45 PM  For questions or updates, please contact   Please consult [email or web address] for contact info under Cardiology/STEMI.

## 2019-01-03 DEATH — deceased
